# Patient Record
Sex: Male | Born: 1945 | Race: Black or African American | Hispanic: No | Marital: Single | Smoking: Never smoker
Health system: Southern US, Community
[De-identification: ages and names within clinical notes are randomized; demographics above are authoritative.]

## PROBLEM LIST (undated history)

## (undated) DIAGNOSIS — E785 Hyperlipidemia, unspecified: Secondary | ICD-10-CM

## (undated) DIAGNOSIS — I639 Cerebral infarction, unspecified: Secondary | ICD-10-CM

## (undated) DIAGNOSIS — I10 Essential (primary) hypertension: Secondary | ICD-10-CM

## (undated) HISTORY — PX: NO PAST SURGERIES: SHX2092

---

## 2015-09-22 ENCOUNTER — Encounter: Payer: Self-pay | Admitting: Gastroenterology

## 2015-10-05 ENCOUNTER — Encounter (HOSPITAL_COMMUNITY): Payer: Self-pay | Admitting: *Deleted

## 2015-10-05 ENCOUNTER — Inpatient Hospital Stay (HOSPITAL_COMMUNITY)
Admission: EM | Admit: 2015-10-05 | Discharge: 2015-10-08 | DRG: 062 | Disposition: A | Discharge status: Rehabilitation Facility | Payer: Medicare Other | Attending: Neurology | Admitting: Neurology

## 2015-10-05 ENCOUNTER — Emergency Department (HOSPITAL_COMMUNITY): Payer: Medicare Other

## 2015-10-05 DIAGNOSIS — Z8249 Family history of ischemic heart disease and other diseases of the circulatory system: Secondary | ICD-10-CM | POA: Diagnosis not present

## 2015-10-05 DIAGNOSIS — Z79899 Other long term (current) drug therapy: Secondary | ICD-10-CM | POA: Diagnosis not present

## 2015-10-05 DIAGNOSIS — R29810 Facial weakness: Secondary | ICD-10-CM | POA: Diagnosis present

## 2015-10-05 DIAGNOSIS — I639 Cerebral infarction, unspecified: Secondary | ICD-10-CM | POA: Diagnosis present

## 2015-10-05 DIAGNOSIS — E669 Obesity, unspecified: Secondary | ICD-10-CM | POA: Diagnosis present

## 2015-10-05 DIAGNOSIS — R29707 NIHSS score 7: Secondary | ICD-10-CM | POA: Diagnosis present

## 2015-10-05 DIAGNOSIS — Z7982 Long term (current) use of aspirin: Secondary | ICD-10-CM

## 2015-10-05 DIAGNOSIS — I6330 Cerebral infarction due to thrombosis of unspecified cerebral artery: Secondary | ICD-10-CM | POA: Diagnosis not present

## 2015-10-05 DIAGNOSIS — M792 Neuralgia and neuritis, unspecified: Secondary | ICD-10-CM | POA: Diagnosis not present

## 2015-10-05 DIAGNOSIS — Z6837 Body mass index (BMI) 37.0-37.9, adult: Secondary | ICD-10-CM | POA: Diagnosis not present

## 2015-10-05 DIAGNOSIS — E785 Hyperlipidemia, unspecified: Secondary | ICD-10-CM | POA: Diagnosis present

## 2015-10-05 DIAGNOSIS — I69351 Hemiplegia and hemiparesis following cerebral infarction affecting right dominant side: Secondary | ICD-10-CM | POA: Diagnosis not present

## 2015-10-05 DIAGNOSIS — I739 Peripheral vascular disease, unspecified: Secondary | ICD-10-CM | POA: Diagnosis present

## 2015-10-05 DIAGNOSIS — I69391 Dysphagia following cerebral infarction: Secondary | ICD-10-CM | POA: Diagnosis not present

## 2015-10-05 DIAGNOSIS — R609 Edema, unspecified: Secondary | ICD-10-CM | POA: Diagnosis not present

## 2015-10-05 DIAGNOSIS — I6339 Cerebral infarction due to thrombosis of other cerebral artery: Secondary | ICD-10-CM | POA: Diagnosis not present

## 2015-10-05 DIAGNOSIS — R471 Dysarthria and anarthria: Secondary | ICD-10-CM | POA: Diagnosis present

## 2015-10-05 DIAGNOSIS — I6789 Other cerebrovascular disease: Secondary | ICD-10-CM | POA: Diagnosis not present

## 2015-10-05 DIAGNOSIS — G8191 Hemiplegia, unspecified affecting right dominant side: Secondary | ICD-10-CM | POA: Diagnosis present

## 2015-10-05 DIAGNOSIS — I10 Essential (primary) hypertension: Secondary | ICD-10-CM | POA: Diagnosis present

## 2015-10-05 DIAGNOSIS — I638 Other cerebral infarction: Secondary | ICD-10-CM | POA: Diagnosis not present

## 2015-10-05 DIAGNOSIS — I63312 Cerebral infarction due to thrombosis of left middle cerebral artery: Secondary | ICD-10-CM

## 2015-10-05 DIAGNOSIS — I69320 Aphasia following cerebral infarction: Secondary | ICD-10-CM | POA: Diagnosis not present

## 2015-10-05 HISTORY — DX: Essential (primary) hypertension: I10

## 2015-10-05 LAB — CBC
HEMATOCRIT: 39 % (ref 39.0–52.0)
HEMOGLOBIN: 13.2 g/dL (ref 13.0–17.0)
MCH: 32.9 pg (ref 26.0–34.0)
MCHC: 33.8 g/dL (ref 30.0–36.0)
MCV: 97.3 fL (ref 78.0–100.0)
Platelets: 255 10*3/uL (ref 150–400)
RBC: 4.01 MIL/uL — ABNORMAL LOW (ref 4.22–5.81)
RDW: 11.9 % (ref 11.5–15.5)
WBC: 4.6 10*3/uL (ref 4.0–10.5)

## 2015-10-05 LAB — PROTIME-INR
INR: 1.01 (ref 0.00–1.49)
Prothrombin Time: 13.5 seconds (ref 11.6–15.2)

## 2015-10-05 LAB — MRSA PCR SCREENING: MRSA BY PCR: NEGATIVE

## 2015-10-05 LAB — DIFFERENTIAL
BASOS PCT: 1 %
Basophils Absolute: 0 10*3/uL (ref 0.0–0.1)
Eosinophils Absolute: 0.2 10*3/uL (ref 0.0–0.7)
Eosinophils Relative: 4 %
LYMPHS ABS: 2.1 10*3/uL (ref 0.7–4.0)
Lymphocytes Relative: 45 %
MONO ABS: 0.4 10*3/uL (ref 0.1–1.0)
MONOS PCT: 8 %
NEUTROS ABS: 1.9 10*3/uL (ref 1.7–7.7)
Neutrophils Relative %: 42 %

## 2015-10-05 LAB — I-STAT CHEM 8, ED
BUN: 14 mg/dL (ref 6–20)
CALCIUM ION: 1.08 mmol/L — AB (ref 1.13–1.30)
CREATININE: 0.9 mg/dL (ref 0.61–1.24)
Chloride: 105 mmol/L (ref 101–111)
GLUCOSE: 136 mg/dL — AB (ref 65–99)
HCT: 41 % (ref 39.0–52.0)
HEMOGLOBIN: 13.9 g/dL (ref 13.0–17.0)
POTASSIUM: 4 mmol/L (ref 3.5–5.1)
Sodium: 141 mmol/L (ref 135–145)
TCO2: 27 mmol/L (ref 0–100)

## 2015-10-05 LAB — COMPREHENSIVE METABOLIC PANEL
ALT: 25 U/L (ref 17–63)
AST: 28 U/L (ref 15–41)
Albumin: 3.4 g/dL — ABNORMAL LOW (ref 3.5–5.0)
Alkaline Phosphatase: 80 U/L (ref 38–126)
Anion gap: 7 (ref 5–15)
BILIRUBIN TOTAL: 0.4 mg/dL (ref 0.3–1.2)
BUN: 11 mg/dL (ref 6–20)
CALCIUM: 8.9 mg/dL (ref 8.9–10.3)
CHLORIDE: 107 mmol/L (ref 101–111)
CO2: 24 mmol/L (ref 22–32)
CREATININE: 0.94 mg/dL (ref 0.61–1.24)
Glucose, Bld: 139 mg/dL — ABNORMAL HIGH (ref 65–99)
Potassium: 4 mmol/L (ref 3.5–5.1)
Sodium: 138 mmol/L (ref 135–145)
TOTAL PROTEIN: 6.3 g/dL — AB (ref 6.5–8.1)

## 2015-10-05 LAB — I-STAT TROPONIN, ED: TROPONIN I, POC: 0 ng/mL (ref 0.00–0.08)

## 2015-10-05 LAB — APTT: aPTT: 33 seconds (ref 24–37)

## 2015-10-05 MED ORDER — SODIUM CHLORIDE 0.9 % IV SOLN
INTRAVENOUS | Status: DC
  Administered 2015-10-05 – 2015-10-08 (×5): via INTRAVENOUS

## 2015-10-05 MED ORDER — STROKE: EARLY STAGES OF RECOVERY BOOK
Freq: Once | Status: AC
  Administered 2015-10-05: 1
  Filled 2015-10-05 (×2): qty 1

## 2015-10-05 MED ORDER — LABETALOL HCL 5 MG/ML IV SOLN
10.0000 mg | INTRAVENOUS | Status: DC | PRN
  Administered 2015-10-06 – 2015-10-08 (×2): 10 mg via INTRAVENOUS
  Filled 2015-10-05 (×3): qty 4

## 2015-10-05 MED ORDER — ALTEPLASE (STROKE) FULL DOSE INFUSION
90.0000 mg | Freq: Once | INTRAVENOUS | Status: AC
  Administered 2015-10-05: 90 mg via INTRAVENOUS
  Filled 2015-10-05: qty 90

## 2015-10-05 MED ORDER — PANTOPRAZOLE SODIUM 40 MG IV SOLR
40.0000 mg | Freq: Every day | INTRAVENOUS | Status: DC
  Administered 2015-10-05 – 2015-10-07 (×3): 40 mg via INTRAVENOUS
  Filled 2015-10-05 (×3): qty 40

## 2015-10-05 MED ORDER — ACETAMINOPHEN 650 MG RE SUPP: 650.0000 mg | RECTAL | Status: DC | PRN

## 2015-10-05 MED ORDER — ACETAMINOPHEN 325 MG PO TABS: 650.0000 mg | ORAL_TABLET | ORAL | Status: DC | PRN

## 2015-10-05 NOTE — Code Documentation (Signed)
[name] for sudden onset slurred speech and Rt side weakness.  He states he felt dazed and has had a recent hx of syncope.  He is half-way through a month long monitoring with a Holter monitor.  Hx HTN. He is NIH 7 for mild dysarthria, mild aphasia, RUE & RLE driftt and sensory deficit & ataxia.  tPa given with door to needle 26 min.

## 2015-10-05 NOTE — ED Provider Notes (Signed)
CSN: [id number]     Arrival date & time 10/05/15  2018 History   First MD Initiated Contact with Patient 10/05/15 2023     Chief Complaint  Patient presents with  . Code Stroke   69 yo AAM w/PMH of HTN who presents by EMS for stroke like sx. Pt endorses he noted right leg weakness while walking. Now beginning to feel slurred speech and decreased sensation on right side of body. He denies CP, SOB, fever, chills, N/V, diarrhea, constipation, hematemesis, dysuria, hematuria, sick contacts, or recent travel.   Patient is a 69 y.o. male presenting with Acute Neurological Problem.  Cerebrovascular Accident This is a new problem. The current episode started today. The problem occurs constantly. The problem has been gradually worsening. Associated symptoms include numbness (right) and weakness (right leg). Pertinent negatives include no abdominal pain, chest pain, chills, fever, headaches, nausea, vertigo, visual change or vomiting. Associated symptoms comments: Slurred speech. Right side weak.. Nothing aggravates the symptoms. He has tried nothing for the symptoms.    Past Medical History  Diagnosis Date  . Hypertension    History reviewed. No pertinent past surgical history. History reviewed. No pertinent family history. Social History  Substance Use Topics  . Smoking status: Never Smoker   . Smokeless tobacco: None  . Alcohol Use: No    Review of Systems  Constitutional: Negative for fever and chills.  Respiratory: Negative for shortness of breath.   Cardiovascular: Negative for chest pain, palpitations and leg swelling.  Gastrointestinal: Negative for nausea, vomiting, abdominal pain, diarrhea, constipation and abdominal distention.  Genitourinary: Negative for dysuria, frequency, flank pain and decreased urine volume.  Neurological: Positive for speech difficulty (slurring), weakness (right leg) and numbness (right). Negative for dizziness, vertigo, light-headedness and headaches.   All other systems reviewed and are negative.     Allergies  Review of patient's allergies indicates no known allergies.  Home Medications   Prior to Admission medications   Medication Sig Start Date End Date Taking? Authorizing Provider  amLODipine (NORVASC) 5 MG tablet Take 5 mg by mouth daily. 09/19/15  Yes Historical Provider, MD  aspirin EC 81 MG tablet Take 81 mg by mouth daily.   Yes Historical Provider, MD  carvedilol (COREG) 6.25 MG tablet Take 6.25 mg by mouth 2 (two) times daily. 09/30/15  Yes Historical Provider, MD   BP 170/90 mmHg  Pulse 66  Temp(Src) 98.2 F (36.8 C) (Oral)  Resp 17  Ht  (1.753 m)  Wt 115.5 kg  BMI 37.59 kg/m2  SpO2 100% Physical Exam  Constitutional: He is oriented to person, place, and time. He appears well-developed and well-nourished. No distress.  HENT:  Head: Normocephalic and atraumatic.  Eyes: Pupils are equal, round, and reactive to light.  Neck: Normal range of motion.  Cardiovascular: Normal rate, regular rhythm, normal heart sounds and intact distal pulses.  Exam reveals no gallop and no friction rub.   No murmur heard. Pulmonary/Chest: Effort normal and breath sounds normal. No respiratory distress. He has no wheezes. He has no rales. He exhibits no tenderness.  Abdominal: Soft. Bowel sounds are normal. He exhibits no distension and no mass. There is no tenderness. There is no rebound and no guarding.  Musculoskeletal: Normal range of motion. He exhibits no edema or tenderness.  Lymphadenopathy:    He has no cervical adenopathy.  Neurological: He is alert and oriented to person, place, and time. A cranial nerve deficit (slight right facial droop) and sensory deficit (right decreased  sensation) is present. He exhibits abnormal muscle tone (decreased strength in right arm and leg). GCS eye subscore is 4. GCS verbal subscore is 5. GCS motor subscore is 6.  Skin: Skin is warm and dry. He is not diaphoretic.  Nursing note and vitals  reviewed.   ED Course  .Critical Care Performed by: Rachelle Hora Authorized by: Rachelle Hora Total critical care time: 30 minutes Critical care time was exclusive of separately billable procedures and treating other patients. Critical care was necessary to treat or prevent imminent or life-threatening deterioration of the following conditions: CNS failure or compromise (stroke). Critical care was time spent personally by me on the following activities: obtaining history from patient or surrogate, ordering and performing treatments and interventions, re-evaluation of patient's condition, discussions with consultants, ordering and review of laboratory studies and ordering and review of radiographic studies. Subsequent provider of critical care: I assumed direction of critical care for this patient from another provider of my specialty.   (including critical care time) Labs Review Labs Reviewed  CBC - Abnormal; Notable for the following:    RBC 4.01 (*)    All other components within normal limits  COMPREHENSIVE METABOLIC PANEL - Abnormal; Notable for the following:    Glucose, Bld 139 (*)    Total Protein 6.3 (*)    Albumin 3.4 (*)    All other components within normal limits  I-STAT CHEM 8, ED - Abnormal; Notable for the following:    Glucose, Bld 136 (*)    Calcium, Ion 1.08 (*)    All other components within normal limits  PROTIME-INR  APTT  DIFFERENTIAL  I-STAT TROPOININ, ED  CBG MONITORING, ED    Imaging Review Ct Head Wo Contrast  10/05/2015  CLINICAL DATA:  69 year old male with right-sided weakness. Code stroke. EXAM: CT HEAD WITHOUT CONTRAST TECHNIQUE: Contiguous axial images were obtained from the base of the skull through the vertex without intravenous contrast. COMPARISON:  None. FINDINGS: The ventricles and sulci are appropriate in size for patient's age. Mild periventricular and deep white matter hypodensities represent chronic microvascular ischemic changes. There is  no intracranial hemorrhage. No mass effect or midline shift identified. The visualized paranasal sinuses and mastoid air cells are well aerated. The calvarium is intact. IMPRESSION: No acute intracranial hemorrhage. Mild chronic microvascular ischemic disease. If symptoms persist and there are no contraindications, MRI may provide better evaluation if clinically indicated. These results were called by telephone at the time of interpretation on 10/05/2015 at 8:38 pm to Dr. Roseanne Reno who verbally acknowledged these results. Electronically Signed   By: Elgie Collard M.D.   On: 10/05/2015 20:39   I have personally reviewed and evaluated these images and lab results as part of my medical decision-making.   EKG Interpretation None      ED ECG REPORT   Date: 10/05/2015  Rate: 67   Rhythm: normal sinus rhythm  QRS Axis: left  Intervals: normal  ST/T Wave abnormalities: TWI in inferior leads  Conduction Disutrbances:none  Narrative Interpretation:   Old EKG Reviewed: none available  I have personally reviewed the EKG tracing and agree with the computerized printout as noted.   MDM   Final diagnoses:  Cerebrovascular accident (CVA), unspecified mechanism (HCC)   69 yo M who presents as code stroke. Sx onset at 7:30PM w/right leg weakness, now with slurred speech and decreased strength and sensation on right side. Airway intact. Sent to CT scanner.   CT w/no acute bleed. Proceeding w/tPA per Neuro advisement.  Will admit to ICU.  Pt was seen under the supervision of Dr. Jodi MourningZavitz.     Rachelle HoraKeri Breon Rehm, MD 10/05/15 2359  Blane OharaJoshua Zavitz, MD 10/09/15 [phone]

## 2015-10-05 NOTE — ED Notes (Signed)
Attempted to call report

## 2015-10-05 NOTE — Consult Note (Signed)
Admission H&P    Chief Complaint: New onset slurred speech and right side weakness and numbness.  HPI: Sean Wilkerson is an 69 y.o. male with a history of hypertension, obesity and syncopal spells, brought to the ED and code stroke status following acute onset of slurred speech along with weakness and numbness involving his right side. Patient was last known well at 7:30 PM tonight. He has no history of stroke nor TIA. He's been taking aspirin 81 mg per day. CT scan of his head showed no acute intracranial abnormality. NIH stroke score was 7. Patient was deemed a candidate for intravenous thrombolytic therapy with TPA, which was administered. Patient was subsequently admitted to neuro intensive care unit for further management. Patient has been experiencing a couple spells of unclear etiology and is currently wearing a long-term cardiac monitor.  LSN: 7:30 PM on 10/05/2015 tPA Given: Yes mRankin:  Past Medical History  Diagnosis Date  . Hypertension     History reviewed. No pertinent past surgical history.  History reviewed. No pertinent family history. Social History:  reports that he has never smoked. He does not have any smokeless tobacco history on file. He reports that he does not drink alcohol or use illicit drugs.  Allergies: No Known Allergies  Medications: Patient's preadmission medications were reviewed by me.  ROS: History obtained from spouse and the patient  General ROS: negative for - chills, fatigue, fever, night sweats, weight gain or weight loss Psychological ROS: negative for - behavioral disorder, hallucinations, memory difficulties, mood swings or suicidal ideation Ophthalmic ROS: negative for - blurry vision, double vision, eye pain or loss of vision ENT ROS: negative for - epistaxis, nasal discharge, oral lesions, sore throat, tinnitus or vertigo Allergy and Immunology ROS: negative for - hives or itchy/watery eyes Hematological and Lymphatic ROS: negative  for - bleeding problems, bruising or swollen lymph nodes Endocrine ROS: negative for - galactorrhea, hair pattern changes, polydipsia/polyuria or temperature intolerance Respiratory ROS: negative for - cough, hemoptysis, shortness of breath or wheezing Cardiovascular ROS: negative for - chest pain, dyspnea on exertion, edema or irregular heartbeat Gastrointestinal ROS: negative for - abdominal pain, diarrhea, hematemesis, nausea/vomiting or stool incontinence Genito-Urinary ROS: negative for - dysuria, hematuria, incontinence or urinary frequency/urgency Musculoskeletal ROS: negative for - joint swelling or muscular weakness Neurological ROS: as noted in HPI Dermatological ROS: negative for rash and skin lesion changes  Physical Examination: Blood pressure 170/90, pulse 65, temperature 98.2 F (36.8 C), temperature source Oral, resp. rate 18, height '5\' 9"'$  (1.753 m), weight 115.5 kg (254 lb 10.1 oz), SpO2 100 %.  HEENT-  Normocephalic, no lesions, without obvious abnormality.  Normal external eye and conjunctiva.  Normal TM's bilaterally.  Normal auditory canals and external ears. Normal external nose, mucus membranes and septum.  Normal pharynx. Neck supple with no masses, nodes, nodules or enlargement. Cardiovascular - regular rate and rhythm, S1, S2 normal, no murmur, click, rub or gallop Lungs - chest clear, no wheezing, rales, normal symmetric air entry Abdomen - soft, non-tender; bowel sounds normal; no masses,  no organomegaly Extremities - no joint deformities, effusion, or inflammation  Neurologic Examination: Mental Status: Alert, oriented, thought content appropriate.  Speech slightly slurred with mild expressive aphasia. Able to follow commands without difficulty. Cranial Nerves: II-Visual fields were normal. III/IV/VI-Pupils were equal and reacted. Extraocular movements were full and conjugate.    V/VII-no facial numbness; mild right lower facial weakness. VIII-normal. X-mild  dysarthria. XI: trapezius strength/neck flexion strength normal bilaterally XII-midline tongue  extension with normal strength. Motor: Mild drift of right upper and lower extremities. Sensory: Reduced perception of tactile sensation over right extremities compared to left. Deep Tendon Reflexes: 1+ in upper extremities and absent in lower extremities. Plantars: Flexor bilaterally Cerebellar: Normal finger-to-nose testing impaired involving right upper extremity. Carotid auscultation: Normal  Results for orders placed or performed during the hospital encounter of 10/05/15 (from the past 48 hour(s))  Protime-INR     Status: None   Collection Time: 10/05/15  8:29 PM  Result Value Ref Range   Prothrombin Time 13.5 11.6 - 15.2 seconds   INR 1.01 0.00 - 1.49  APTT     Status: None   Collection Time: 10/05/15  8:29 PM  Result Value Ref Range   aPTT 33 24 - 37 seconds  CBC     Status: Abnormal   Collection Time: 10/05/15  8:29 PM  Result Value Ref Range   WBC 4.6 4.0 - 10.5 K/uL   RBC 4.01 (L) 4.22 - 5.81 MIL/uL   Hemoglobin 13.2 13.0 - 17.0 g/dL   HCT 39.0 39.0 - 52.0 %   MCV 97.3 78.0 - 100.0 fL   MCH 32.9 26.0 - 34.0 pg   MCHC 33.8 30.0 - 36.0 g/dL   RDW 11.9 11.5 - 15.5 %   Platelets 255 150 - 400 K/uL  Differential     Status: None   Collection Time: 10/05/15  8:29 PM  Result Value Ref Range   Neutrophils Relative % 42 %   Neutro Abs 1.9 1.7 - 7.7 K/uL   Lymphocytes Relative 45 %   Lymphs Abs 2.1 0.7 - 4.0 K/uL   Monocytes Relative 8 %   Monocytes Absolute 0.4 0.1 - 1.0 K/uL   Eosinophils Relative 4 %   Eosinophils Absolute 0.2 0.0 - 0.7 K/uL   Basophils Relative 1 %   Basophils Absolute 0.0 0.0 - 0.1 K/uL  Comprehensive metabolic panel     Status: Abnormal   Collection Time: 10/05/15  8:29 PM  Result Value Ref Range   Sodium 138 135 - 145 mmol/L   Potassium 4.0 3.5 - 5.1 mmol/L   Chloride 107 101 - 111 mmol/L   CO2 24 22 - 32 mmol/L   Glucose, Bld 139 (H) 65 - 99 mg/dL    BUN 11 6 - 20 mg/dL   Creatinine, Ser 0.94 0.61 - 1.24 mg/dL   Calcium 8.9 8.9 - 10.3 mg/dL   Total Protein 6.3 (L) 6.5 - 8.1 g/dL   Albumin 3.4 (L) 3.5 - 5.0 g/dL   AST 28 15 - 41 U/L   ALT 25 17 - 63 U/L   Alkaline Phosphatase 80 38 - 126 U/L   Total Bilirubin 0.4 0.3 - 1.2 mg/dL   GFR calc non Af Amer >60 >60 mL/min   GFR calc Af Amer >60 >60 mL/min    Comment: (NOTE) The eGFR has been calculated using the CKD EPI equation. This calculation has not been validated in all clinical situations. eGFR's persistently <60 mL/min signify possible Chronic Kidney Disease.    Anion gap 7 5 - 15  I-stat troponin, ED (not at [facility], [facility])     Status: None   Collection Time: 10/05/15  8:33 PM  Result Value Ref Range   Troponin i, poc 0.00 0.00 - 0.08 ng/mL   Comment 3            Comment: Due to the release kinetics of cTnI, a negative result within the first hours  of the onset of symptoms does not rule out myocardial infarction with certainty. If myocardial infarction is still suspected, repeat the test at appropriate intervals.   I-Stat Chem 8, ED  (not at [facility], [facility])     Status: Abnormal   Collection Time: 10/05/15  8:35 PM  Result Value Ref Range   Sodium 141 135 - 145 mmol/L   Potassium 4.0 3.5 - 5.1 mmol/L   Chloride 105 101 - 111 mmol/L   BUN 14 6 - 20 mg/dL   Creatinine, Ser 0.90 0.61 - 1.24 mg/dL   Glucose, Bld 136 (H) 65 - 99 mg/dL   Calcium, Ion 1.08 (L) 1.13 - 1.30 mmol/L   TCO2 27 0 - 100 mmol/L   Hemoglobin 13.9 13.0 - 17.0 g/dL   HCT 41.0 39.0 - 52.0 %   Ct Head Wo Contrast  10/05/2015  CLINICAL DATA:  69 year old male with right-sided weakness. Code stroke. EXAM: CT HEAD WITHOUT CONTRAST TECHNIQUE: Contiguous axial images were obtained from the base of the skull through the vertex without intravenous contrast. COMPARISON:  None. FINDINGS: The ventricles and sulci are appropriate in size for patient's age. Mild periventricular and deep white matter hypodensities  represent chronic microvascular ischemic changes. There is no intracranial hemorrhage. No mass effect or midline shift identified. The visualized paranasal sinuses and mastoid air cells are well aerated. The calvarium is intact. IMPRESSION: No acute intracranial hemorrhage. Mild chronic microvascular ischemic disease. If symptoms persist and there are no contraindications, MRI may provide better evaluation if clinically indicated. These results were called by telephone at the time of interpretation on 10/05/2015 at 8:38 pm to Dr. Nicole Kindred who verbally acknowledged these results. Electronically Signed   By: Anner Crete M.D.   On: 10/05/2015 20:39    Assessment: 69 y.o. male with multiple risk factors for stroke presenting with acute onset of probable left MCA territory acute ischemic infarction.  Stroke Risk Factors - family history and hypertension  Plan: 1. HgbA1c, fasting lipid panel 2. MRI, MRA  of the brain without contrast 3. PT consult, OT consult, Speech consult 4. Echocardiogram 5. Carotid dopplers 6. Prophylactic therapy-Antiplatelet med: Aspirin  7. Risk factor modification 8. Telemetry monitoring  This patient is critically ill and at significant risk of neurological worsening or death, and care requires constant monitoring of vital signs, hemodynamics,respiratory and cardiac monitoring, neurological assessment, discussion with family, other specialists and medical decision making of high complexity. Total critical care time was 60 minutes.  C.R. Nicole Kindred, MD [facility] [phone]  10/05/2015, 9:36 PM

## 2015-10-05 NOTE — ED Notes (Signed)
Family at bedside. Dr.Stewart speaking with family

## 2015-10-05 NOTE — ED Notes (Signed)
Pt to ED via [facility] as a Code Stroke. Pt began having difficulty walking using R leg at 1930. Pt also had R sided weakness, lessened sensory to R side, and slurred speecj. Pt is wearing a halter monitor for syncopal episodes. Pt A&Ox4 on arrival, R sided deficits noted

## 2015-10-06 ENCOUNTER — Inpatient Hospital Stay (HOSPITAL_COMMUNITY): Payer: Medicare Other

## 2015-10-06 ENCOUNTER — Encounter (HOSPITAL_COMMUNITY): Payer: Self-pay | Admitting: *Deleted

## 2015-10-06 DIAGNOSIS — I6789 Other cerebrovascular disease: Secondary | ICD-10-CM

## 2015-10-06 DIAGNOSIS — I639 Cerebral infarction, unspecified: Principal | ICD-10-CM

## 2015-10-06 DIAGNOSIS — G8191 Hemiplegia, unspecified affecting right dominant side: Secondary | ICD-10-CM

## 2015-10-06 LAB — LIPID PANEL
Cholesterol: 205 mg/dL — ABNORMAL HIGH (ref 0–200)
HDL: 44 mg/dL (ref >40)
LDL Cholesterol: 135 mg/dL — ABNORMAL HIGH (ref 0–99)
Total CHOL/HDL Ratio: 4.7 RATIO
Triglycerides: 131 mg/dL (ref <150)
VLDL: 26 mg/dL (ref 0–40)

## 2015-10-06 MED ORDER — ATORVASTATIN CALCIUM 40 MG PO TABS
40.0000 mg | ORAL_TABLET | Freq: Every day | ORAL | Status: DC
  Administered 2015-10-06: 40 mg via ORAL
  Filled 2015-10-06: qty 1

## 2015-10-06 MED ORDER — ASPIRIN 325 MG PO TABS
325.0000 mg | ORAL_TABLET | Freq: Every day | ORAL | Status: DC
  Administered 2015-10-06 – 2015-10-08 (×3): 325 mg via ORAL
  Filled 2015-10-06 (×3): qty 1

## 2015-10-06 NOTE — Plan of Care (Addendum)
Problem: Nutrition: Goal: Risk of aspiration will decrease Outcome: Progressing Pt had SLP eval 12/19 with MBS. Reviewed safety precautions to take while eating and drinking.

## 2015-10-06 NOTE — Progress Notes (Signed)
PT Cancellation Note  Patient Details Name: Sean BirchwoodLarry Lee Wilkerson MRN: [id number] DOB: 1946-02-02   Cancelled Treatment:    Reason Eval/Treat Not Completed: Patient not medically ready.  Pt currently on strict bedrest post tpa last night.  Will hold PT and mobility at this time and f/u when appropriate.     Sunny SchleinRitenour, Sora Olivo F, [place] CarolinaPT [phone] 10/06/2015, 12:18 PM

## 2015-10-06 NOTE — Progress Notes (Signed)
  Echocardiogram 2D Echocardiogram has been performed.  Janalyn HarderWest, Kearsten Ginther R 10/06/2015, 3:40 PM

## 2015-10-06 NOTE — Care Management Note (Signed)
Case Management Note  Patient Details  Name: Sean Wilkerson MRN: [id number] Date of Birth: [birth year]  Subjective/Objective:  Pt admitted on 10/05/15 s/p stroke with TPA given.  PTA, pt independent, lives with spouse.                   Action/Plan: Will follow for discharge planning as pt progresses.  PT/OT consults pending.    Expected Discharge Date:                  Expected Discharge Plan:  IP Rehab Facility  In-House Referral:     Discharge planning Services  CM Consult  Post Acute Care Choice:    Choice offered to:     DME Arranged:    DME Agency:     HH Arranged:    HH Agency:     Status of Service:  In process, will continue to follow  Medicare Important Message Given:    Date Medicare IM Given:    Medicare IM give by:    Date Additional Medicare IM Given:    Additional Medicare Important Message give by:     If discussed at Long Length of Stay Meetings, dates discussed:    Additional Comments:  Quintella BatonJulie W. Aalaiyah Yassin, RN, BSN  Trauma/Neuro ICU Case Manager [phone]

## 2015-10-06 NOTE — Evaluation (Signed)
CognitiveSpeech Language Pathology Evaluation Patient Details Name: Sean BirchwoodLarry Lee Wilkerson MRN: [id number] DOB: 1946/05/15 Today's Date: 10/06/2015 Time: 0820-0830 SLP Time Calculation (min) (ACUTE ONLY): 10 min  Problem List:  Patient Active Problem List   Diagnosis Date Noted  . Stroke (cerebrum) (HCC) 10/05/2015  . CVA (cerebral infarction) 10/05/2015   Past Medical History:  Past Medical History  Diagnosis Date  . Hypertension    Past Surgical History: History reviewed. No pertinent past surgical history. HPI:  Sean Wilkerson is an 69 y.o. male with a history of hypertension, obesity and syncopal spells, brought to the ED and code stroke status following acute onset of slurred speech along with weakness and numbness involving his right side. Patient was last known well at 7:30 PM tonight. He has no history of stroke nor TIA. He's been taking aspirin 81 mg per day. CT scan of his head showed no acute intracranial abnormality. NIH stroke score was 7. Patient was deemed a candidate for intravenous thrombolytic therapy with TPA, which was administered. Patient was subsequently admitted to neuro intensive care unit for further management. Patient has been experiencing a couple spells of unclear etiology and is currently wearing a long-term cardiac monitor.   Assessment / Plan / Recommendation Clinical Impression   Cognitive/linguistic and motor speech evaluation was completed.  The patient scored at 27/30 on the Mini Mental State Exam.  The patient had difficulty copying a design and recalling with interference 3/3 novel words.  He was able to recall 2/3 and given semantic cues recalled the third word.  The patient presented with a moderate- severe dysarthria impacting speech intelligibility.   Recommend continued ST to address at this and next level of care.      SLP Assessment  Patient needs continued Speech Lanaguage Pathology Services    Follow Up Recommendations  Inpatient Rehab     Frequency and Duration min 2x/week  2 weeks      SLP Evaluation Prior Functioning  Cognitive/Linguistic Baseline: Within functional limits   Cognition  Overall Cognitive Status: Within Functional Limits for tasks assessed Arousal/Alertness: Awake/alert Orientation Level: Oriented X4 Attention: Sustained Sustained Attention: Appears intact Memory: Appears intact Awareness: Appears intact Problem Solving: Appears intact    Comprehension  Auditory Comprehension Overall Auditory Comprehension: Appears within functional limits for tasks assessed Yes/No Questions: Within Functional Limits Commands: Within Functional Limits Conversation: Simple Reading Comprehension Reading Status: Within funtional limits    Expression Expression Primary Mode of Expression: Verbal Verbal Expression Overall Verbal Expression: Appears within functional limits for tasks assessed Automatic Speech: Name;Social Response Level of Generative/Spontaneous Verbalization: Sentence Repetition: No impairment Naming: No impairment Pragmatics: No impairment Non-Verbal Means of Communication: Not applicable Written Expression Dominant Hand: Left Written Expression: Within Functional Limits (except decreased legibility)   Oral / Motor Oral Motor/Sensory Function Overall Oral Motor/Sensory Function: Moderate impairment Facial ROM: Reduced right Facial Symmetry: Abnormal symmetry right Facial Strength: Reduced right Facial Sensation: Within Functional Limits Lingual ROM: Reduced right Lingual Symmetry: Abnormal symmetry right Lingual Strength: Reduced Motor Speech Overall Motor Speech: Impaired Respiration: Within functional limits Phonation: Breathy;Low vocal intensity Resonance: Within functional limits Articulation: Impaired Level of Impairment: Phrase Intelligibility: Intelligibility reduced Word: 50-74% accurate Phrase: 25-49% accurate Sentence: 25-49% accurate Conversation: 25-49%  accurate Motor Planning: Witnin functional limits Motor Speech Errors: Not applicable   Sean AguasMelissa Rea Kalama, MA, CCC-SLP Acute Rehab SLP [phone] Fleet ContrasGoodman, Pistol Kessenich N 10/06/2015, 8:49 AM

## 2015-10-06 NOTE — Progress Notes (Signed)
Paged MD regarding return of weakness on right side.  Orders received.

## 2015-10-06 NOTE — Progress Notes (Signed)
STROKE TEAM PROGRESS NOTE   HISTORY Sean Wilkerson is an 69 y.o. male with a history of hypertension, obesity and syncopal spells, brought to the ED and code stroke status following acute onset of slurred speech along with weakness and numbness involving his right side. Patient was last known well at 7:30 PM tonight. He has no history of stroke nor TIA. He's been taking aspirin 81 mg per day. CT scan of his head showed no acute intracranial abnormality. NIH stroke score was 7. Patient was deemed a candidate for intravenous thrombolytic therapy with TPA, which was administered. Patient was subsequently admitted to neuro intensive care unit for further management. Patient has been experiencing a couple spells of unclear etiology and is currently wearing a long-term cardiac monitor. IV TPA  90 mg Sunday 10/05/2015 at 2045  LSN: 7:30 PM on 10/05/2015 tPA Given: Yes mRankin:   SUBJECTIVE (INTERVAL HISTORY) His  wife is at the bedside.  Overall he feels his condition is gradually worsening. Recurrence of right hemiplegia last night. Blood pressure has been stable. Repeat CT scan of the head last night showed no hemorrhage or acute abnormality.   OBJECTIVE Temp:  [97.5 F (36.4 C)-98.2 F (36.8 C)] 97.8 F (36.6 C) (12/19 0741) Pulse Rate:  [62-78] 64 (12/19 0700) Cardiac Rhythm:  [-] Normal sinus rhythm (12/18 2200) Resp:  [8-24] 18 (12/19 0700) BP: (133-192)/(64-93) 155/69 mmHg (12/19 0700) SpO2:  [96 %-100 %] 99 % (12/19 0700) Weight:  [115.5 kg (254 lb 10.1 oz)] 115.5 kg (254 lb 10.1 oz) (12/18 2033)  CBC:  Recent Labs Lab 10/05/15 2029 10/05/15 2035  WBC 4.6  --   NEUTROABS 1.9  --   HGB 13.2 13.9  HCT 39.0 41.0  MCV 97.3  --   PLT 255  --     Basic Metabolic Panel:  Recent Labs Lab 10/05/15 2029 10/05/15 2035  NA 138 141  K 4.0 4.0  CL 107 105  CO2 24  --   GLUCOSE 139* 136*  BUN 11 14  CREATININE 0.94 0.90  CALCIUM 8.9  --     Lipid Panel:    Component  Value Date/Time   CHOL 205* 10/06/2015 0228   TRIG 131 10/06/2015 0228   HDL 44 10/06/2015 0228   CHOLHDL 4.7 10/06/2015 0228   VLDL 26 10/06/2015 0228   LDLCALC 135* 10/06/2015 0228   HgbA1c: No results found for: HGBA1C Urine Drug Screen: No results found for: LABOPIA, COCAINSCRNUR, LABBENZ, AMPHETMU, THCU, LABBARB    IMAGING  Ct Head Wo Contrast 10/06/2015   No acute intracranial hemorrhage. Mild age-related atrophy and chronic microvascular ischemic disease.    Ct Head Wo Contrast 10/05/2015   No acute intracranial hemorrhage. Mild chronic microvascular ischemic disease. If symptoms persist and there are no contraindications, MRI may provide better evaluation if clinically indicated.    MRI / MRA - Pending    PHYSICAL EXAM  Middle-aged obese African-American male not in distress. . Afebrile. Head is nontraumatic. Neck is supple without bruit.    Cardiac exam no murmur or gallop. Lungs are clear to auscultation. Distal pulses are well felt.  Neurological Exam : Awake alert oriented to time place and person. Mild dysarthria. Moderate right lower facial weakness. Extraocular movements full range except slight restriction of right lateral gaze. Blinks to threat bilaterally. Fundi were not visualized. Vision acuity seems adequate. Tongue deviates to the right. Motor system exam reveals dense right hemiplegia with 0/5 strength. Normal strength in the left. Diminished  on the right. Sensation and diminished in the right hemibody distribution. Deep tendon reflexes absent on the right hand normal on the left. Right plantar upgoing left downgoing. Gait cannot be tested.  NIHSS 13   ASSESSMENT/PLAN [name] is a 69 y.o. male with history of hypertension, obesity and syncopal spells, presenting with slurred speech along with weakness and numbness involving his right side. 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 No cardiac source of emboli identified.  LDL 135  HgbA1c pending  VTE prophylaxis - SCDs  Diet NPO time specified  aspirin 81 mg daily prior to admission, now on No antithrombotic secondary to TPA  Patient counseled to be compliant with his antithrombotic medications  Ongoing aggressive stroke risk factor management  Therapy recommendations:  Pending  Disposition:  Pending  Hypertension  Stable Permissive hypertension (OK if < 220/120) but gradually normalize in 5-7 days  Hyperlipidemia  Home meds: No lipid lowering medications prior to admission  LDL 135, goal < 70  Add Lipitor 40 mg daily  Continue statin at discharge    Other Stroke Risk Factors  Advanced age  Obesity, Body mass index is 37.59 kg/(m^2).    Other Active Problems    Hospital day # 1  Delton SeeDavid Rinehuls PA-C [facility] Pager [phone] 10/06/2015, 5:34 PM I have personally examined this patient, reviewed notes, independently viewed imaging studies, participated in medical decision making and plan of care. I have made any additions or clarifications directly to the above note. Agree with note above. She presented with right gaze paresis and improved dramatically following IV TPA but unfortunately developed dense hemiplegia related on. He likely has a large left subcortical infarct from small vessel disease though workup is pending. He remains at risk for neurological worsening, recurrent stroke, TIA and needs ongoing evaluation and close blood pressure monitoring and neurological follow-up. I long discussion of the bedside with the patient's wife and multiple family members and discuss his prognosis, plan of care and answered questions. This patient is critically ill and at significant risk of  neurological worsening, death and care requires constant monitoring of vital signs, hemodynamics,respiratory and cardiac monitoring, extensive review of multiple databases, frequent neurological assessment, discussion with family, other specialists and medical decision making of high complexity.I have made any additions or clarifications directly to the above note.This critical care time does not reflect procedure time, or teaching time or supervisory time of PA/NP/Med Resident etc but could involve care discussion time.  I spent 45 minutes of neurocritical care time  in the care of  this patient.     Delia HeadyPramod Dewitte Vannice, MD Medical Director [facility] Pager: [phone] 10/06/2015 8:30 PM     To contact Stroke Continuity provider, please refer to [email or web address]. After hours, contact General Neurology

## 2015-10-06 NOTE — Evaluation (Signed)
Clinical/Bedside Swallow Evaluation Patient Details  Name: Sean Wilkerson MRN: [id number] Date of Birth: 12/24/1945  Today's Date: 10/06/2015 Time: SLP Start Time (ACUTE ONLY): 0810 SLP Stop Time (ACUTE ONLY): 0820 SLP Time Calculation (min) (ACUTE ONLY): 10 min  Past Medical History:  Past Medical History  Diagnosis Date  . Hypertension    Past Surgical History: History reviewed. No pertinent past surgical history. HPI:  Sean Wilkerson is an 69 y.o. male with a history of hypertension, obesity and syncopal spells, brought to the ED and code stroke status following acute onset of slurred speech along with weakness and numbness involving his right side. Patient was last known well at 7:30 PM tonight. He has no history of stroke nor TIA. He's been taking aspirin 81 mg per day. CT scan of his head showed no acute intracranial abnormality. NIH stroke score was 7. Patient was deemed a candidate for intravenous thrombolytic therapy with TPA, which was administered. Patient was subsequently admitted to neuro intensive care unit for further management. Patient has been experiencing a couple spells of unclear etiology and is currently wearing a long-term cardiac monitor.   Assessment / Plan / Recommendation Clinical Impression     Clinical swallowing evaluation was completed. Oral mechanism exam revealed significant right sided facial, lingual and labial weakness and decreased ROM.  Lingual deviation to the right noted for all movements.  No right sided facial movement noted given attempted movement.  Sensation appeared to be intact.    Clinically the patient presented with oropharyngeal dysphagia characterized by decreased labial seal with anterior escape on the right, delayed oral transit and delayed swallow trigger.  Wet vocal quality noted post swallow.  The patient with very weak cough and weak sounding voice.  Given this and his current symptoms recommend keeping the patient NPO pending  MBS.     Aspiration Risk  Moderate aspiration risk    Diet Recommendation   NPO pending MBS.    Medication Administration: Via alternative means    Other  Recommendations Oral Care Recommendations: Oral care QID   Follow up Recommendations  Inpatient Rehab    Frequency and Duration min 2x/week  2 weeks       Prognosis Prognosis for Safe Diet Advancement: Fair      Swallow Study   General Date of Onset: 10/05/15 HPI: Sean Wilkerson is an 69 y.o. male with a history of hypertension, obesity and syncopal spells, brought to the ED and code stroke status following acute onset of slurred speech along with weakness and numbness involving his right side. Patient was last known well at 7:30 PM tonight. He has no history of stroke nor TIA. He's been taking aspirin 81 mg per day. CT scan of his head showed no acute intracranial abnormality. NIH stroke score was 7. Patient was deemed a candidate for intravenous thrombolytic therapy with TPA, which was administered. Patient was subsequently admitted to neuro intensive care unit for further management. Patient has been experiencing a couple spells of unclear etiology and is currently wearing a long-term cardiac monitor. Type of Study: Bedside Swallow Evaluation Previous Swallow Assessment: None noted.   Diet Prior to this Study: NPO Temperature Spikes Noted: No Respiratory Status: Room air History of Recent Intubation: No Behavior/Cognition: Alert;Cooperative;Pleasant mood Oral Cavity Assessment: Within Functional Limits Oral Care Completed by SLP: No Vision: Functional for self-feeding Self-Feeding Abilities: Able to feed self Patient Positioning: Upright in bed Baseline Vocal Quality: Low vocal intensity;Breathy Volitional Cough: Weak Volitional Swallow: Able to  elicit    Oral/Motor/Sensory Function Overall Oral Motor/Sensory Function: Moderate impairment Facial ROM: Reduced right Facial Symmetry: Abnormal symmetry right Facial  Strength: Reduced right Facial Sensation: Within Functional Limits Lingual ROM: Reduced right Lingual Symmetry: Abnormal symmetry right Lingual Strength: Reduced   Ice Chips Ice chips: Impaired Presentation: Spoon Oral Phase Impairments: Impaired mastication Oral Phase Functional Implications: Prolonged oral transit Pharyngeal Phase Impairments: Suspected delayed Swallow   Thin Liquid Thin Liquid: Impaired Presentation: Spoon Oral Phase Impairments: Poor awareness of bolus;Impaired mastication Oral Phase Functional Implications: Right anterior spillage;Prolonged oral transit Pharyngeal  Phase Impairments: Suspected delayed Swallow;Wet Vocal Quality    Nectar Thick Nectar Thick Liquid: Not tested   Honey Thick Honey Thick Liquid: Not tested   Puree Puree: Impaired Presentation: Spoon Oral Phase Impairments: Impaired mastication Oral Phase Functional Implications: Prolonged oral transit Pharyngeal Phase Impairments: Suspected delayed Swallow   Solid Solid: Not tested      Dimas Aguas, MA, CCC-SLP Acute Rehab SLP [phone] Dimas Aguas N 10/06/2015,8:41 AM

## 2015-10-07 ENCOUNTER — Encounter (HOSPITAL_COMMUNITY): Payer: Self-pay | Admitting: Physical Medicine and Rehabilitation

## 2015-10-07 ENCOUNTER — Inpatient Hospital Stay (HOSPITAL_COMMUNITY): Payer: Medicare Other

## 2015-10-07 DIAGNOSIS — G8191 Hemiplegia, unspecified affecting right dominant side: Secondary | ICD-10-CM

## 2015-10-07 DIAGNOSIS — I639 Cerebral infarction, unspecified: Secondary | ICD-10-CM

## 2015-10-07 DIAGNOSIS — I6339 Cerebral infarction due to thrombosis of other cerebral artery: Secondary | ICD-10-CM

## 2015-10-07 LAB — GLUCOSE, CAPILLARY
GLUCOSE-CAPILLARY: 131 mg/dL — AB (ref 65–99)
GLUCOSE-CAPILLARY: 124 mg/dL — AB (ref 65–99)

## 2015-10-07 LAB — HEMOGLOBIN A1C
HEMOGLOBIN A1C: 6 % — AB (ref 4.8–5.6)
Mean Plasma Glucose: 126 mg/dL

## 2015-10-07 MED ORDER — CARVEDILOL 6.25 MG PO TABS
6.2500 mg | ORAL_TABLET | Freq: Two times a day (BID) | ORAL | Status: DC
  Administered 2015-10-07 – 2015-10-08 (×2): 6.25 mg via ORAL
  Filled 2015-10-07 (×2): qty 1

## 2015-10-07 MED ORDER — AMLODIPINE BESYLATE 5 MG PO TABS
5.0000 mg | ORAL_TABLET | Freq: Every day | ORAL | Status: DC
  Administered 2015-10-07 – 2015-10-08 (×2): 5 mg via ORAL
  Filled 2015-10-07 (×2): qty 1

## 2015-10-07 NOTE — Progress Notes (Signed)
OT Cancellation Note  Patient Details Name: Adolphus BirchwoodLarry Lee Roads MRN: [id number] DOB: [birth year]   Cancelled Treatment:    Reason Eval/Treat Not Completed: Patient not medically ready (bedrest) OT order received and appreciated however this conflicts with current bedrest order set. Please increase activity tolerance as appropriate and remove bedrest from orders. . Please contact OT at [phone] if bed rest order is discontinued. OT will hold evaluation at this time and will check back as time allows pending increased activity orders.   Harolyn RutherfordJones, Vallerie Hentz B  [phone] 10/07/2015, 6:40 AM

## 2015-10-07 NOTE — Evaluation (Signed)
Physical Therapy Evaluation Patient Details Name: Sean Wilkerson MRN: [id number] DOB: 13-Jun-1946 Today's Date: 10/07/2015   History of Present Illness  pt presents with large L Subcortical Infarct.  pt with hx of HTN and Syncope.    Clinical Impression  Pt very motivated and follows cues well.  Feel pt will benefit from CIR at D/C to maximize independence and decrease burden of care prior to returning to home.  Will continue to follow.      Follow Up Recommendations CIR    Equipment Recommendations  None recommended by PT    Recommendations for Other Services Rehab consult     Precautions / Restrictions Precautions Precautions: Fall Restrictions Weight Bearing Restrictions: No      Mobility  Bed Mobility Overal bed mobility: Needs Assistance;+2 for physical assistance Bed Mobility: Supine to Sit;Sit to Supine     Supine to sit: Mod assist;+2 for physical assistance;HOB elevated Sit to supine: Mod assist;+2 for physical assistance   General bed mobility comments: pt does attempt to perform bed mobility and has difficulty with moving R side and coordination.    Transfers Overall transfer level: Needs assistance Equipment used: 2 person hand held assist Transfers: Sit to/from Stand Sit to Stand: Max assist;+2 physical assistance         General transfer comment: pt needs R LE blocked and facilitation for weight shifting to L side as pt tends to lean to R side, but not pushing.  pt's R LE tends to hyperextend pushing R toes against PT's foot and causing hip flexion.  Facilitation for hip/trunk extension and working on R knee extension without hyperextension.  Repeated transfer x2.    Ambulation/Gait                Stairs            Wheelchair Mobility    Modified Rankin (Stroke Patients Only) Modified Rankin (Stroke Patients Only) Pre-Morbid Rankin Score: No symptoms Modified Rankin: Severe disability     Balance Overall balance assessment:  Needs assistance Sitting-balance support: Single extremity supported;Feet supported Sitting balance-Leahy Scale: Fair Sitting balance - Comments: pt drifts towards R side and with minimal cues is able to bring self back to midline.   Postural control: Right lateral lean Standing balance support: During functional activity Standing balance-Leahy Scale: Poor                               Pertinent Vitals/Pain Pain Assessment: No/denies pain    Home Living Family/patient expects to be discharged to:: Inpatient rehab Living Arrangements: Spouse/significant other                    Prior Function Level of Independence: Independent         Comments: Works as a Education officer, environmental.     Hand Dominance   Dominant Hand: Left    Extremity/Trunk Assessment   Upper Extremity Assessment: Defer to OT evaluation           Lower Extremity Assessment: RLE deficits/detail RLE Deficits / Details: Strength near flaccid.  Does demonstrate trace hamstrings and hip hike.  Sensation diminished, but not absent.      Cervical / Trunk Assessment: Normal  Communication   Communication: Expressive difficulties (Slurred and quiet)  Cognition Arousal/Alertness: Awake/alert Behavior During Therapy: Flat affect Overall Cognitive Status: Within Functional Limits for tasks assessed  General Comments      Exercises        Assessment/Plan    PT Assessment Patient needs continued PT services  PT Diagnosis Difficulty walking;Hemiplegia dominant side   PT Problem List Decreased strength;Decreased activity tolerance;Decreased balance;Decreased mobility;Decreased coordination;Decreased knowledge of use of DME;Impaired sensation;Obesity  PT Treatment Interventions DME instruction;Gait training;Functional mobility training;Therapeutic activities;Therapeutic exercise;Balance training;Neuromuscular re-education;Patient/family education   PT Goals (Current goals  can be found in the Care Plan section) Acute Rehab PT Goals Patient Stated Goal: To continue working as a Education officer, environmentalpastor. PT Goal Formulation: With patient/family Time For Goal Achievement: 10/21/15 Potential to Achieve Goals: Good    Frequency Min 4X/week   Barriers to discharge        Co-evaluation               End of Session Equipment Utilized During Treatment: Gait belt Activity Tolerance: Patient tolerated treatment well Patient left: in bed;with call bell/phone within reach;with family/visitor present Nurse Communication: Mobility status         Time: [id number] PT Time Calculation (min) (ACUTE ONLY): 27 min   Charges:   PT Evaluation $Initial PT Evaluation Tier I: 1 Procedure PT Treatments $Therapeutic Activity: 8-22 mins   PT G CodesSunny Schlein:        Denna Fryberger F, [place]PT [phone] 10/07/2015, 2:26 PM

## 2015-10-07 NOTE — Consult Note (Signed)
Physical Medicine and Rehabilitation Consult   Reason for Consult: Right sided weakness and difficulty speaking.  Referring Physician: Dr. Pearlean Brownie   HPI: Sean Wilkerson is a 69 y.o. left handed male with history of HTN, recent syncopal episode (being evaluated with cardiac monitor) who was admitted on 10/05/15 with right sided weakness and slurred speech. CT head negative and he was treated with tPA for NIHSS 7. 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  2D echo with EF 55-60% with no wall abnormality. Carotid dopplers without ICA stenosis.  Swallow evaluation done yesterday and patient started on regular diet, thin liquids with aspiration precautions for mild pharyngeal dysphagia. Patient with resultant right hemiparesis, moderate to severe dysarthria and dysphagia. PT/OT evaluations pending and CIR recommended in anticipation of extensive rehab needs.   Review of Systems  HENT: Negative for congestion and hearing loss.   Eyes: Negative for blurred vision and double vision.  Respiratory: Negative for cough, shortness of breath and wheezing.   Cardiovascular: Negative for chest pain and palpitations.  Gastrointestinal: Negative for heartburn, nausea, abdominal pain and constipation.  Genitourinary: Negative for dysuria, urgency and frequency.  Musculoskeletal: Negative for myalgias, back pain and neck pain.  Neurological: Positive for sensory change, speech change and focal weakness. Negative for dizziness, tingling and headaches.  Psychiatric/Behavioral: The patient is not nervous/anxious and does not have insomnia.       Past Medical History  Diagnosis Date  . Hypertension     Past Surgical History  Procedure Laterality Date  . No past surgeries      Family  History  Problem Relation Age of Onset  . Cancer Maternal Uncle       Social History:  Married.  Works fulltime as a Education officer, environmental at [employer]. Independent without AD.  Wife works part time as a [employer]. He reports that he has never smoked. He does not have any smokeless tobacco history on file. He reports that he does not drink alcohol or use illicit drugs.    Allergies: No Known Allergies    Medications Prior to Admission  Medication Sig Dispense Refill  . amLODipine (NORVASC) 5 MG tablet Take 5 mg by mouth daily.  1  . aspirin EC 81 MG tablet Take 81 mg by mouth daily.    . carvedilol (COREG) 6.25 MG tablet Take 6.25 mg by mouth 2 (two) times daily.  1    Home: Home Living Family/patient expects to be discharged to:: Private residence Living Arrangements: Spouse/significant other  Functional History:   Functional Status:  Mobility:          ADL:    Cognition: Cognition Overall Cognitive Status: Within Functional Limits for tasks assessed Arousal/Alertness: Awake/alert Orientation Level: Oriented X4 Attention: Sustained Sustained Attention: Appears intact Memory: Appears intact Awareness: Appears intact Problem Solving: Appears intact Cognition Overall Cognitive Status: Within Functional Limits for tasks assessed    Blood pressure 146/77, pulse 67, temperature 98.8 F (37.1 C), temperature source Oral, resp. rate 19, height 5\' 9"  (1.753 m), weight 115.5 kg (254 lb 10.1 oz), SpO2 100 %. Physical Exam  Nursing note and vitals reviewed. Constitutional: He is oriented to person, place, and time. He appears well-developed and well-nourished. He is cooperative.  HENT:  Head: Normocephalic and atraumatic.  Eyes: Conjunctivae are  normal. Pupils are equal, round, and reactive to light.  Neck: Normal range of motion. Neck supple.  Cardiovascular: Normal rate and regular rhythm.   Respiratory: Effort normal and breath sounds normal. No stridor. No respiratory distress. He  exhibits no tenderness.  GI: Soft. Bowel sounds are normal. He exhibits no distension. There is no tenderness.  Musculoskeletal: He exhibits no edema or tenderness.  Neurological: He is alert and oriented to person, place, and time.  Mild right facial weakness with moderate dysarthria.  Able to follow one and two step commands without difficulty. Right hemiparesis with sensory deficits. Has good awareness and insight into deficits. Strength 0/5 RUE and RLE. Right central 7 and tongue deviation.   Skin: Skin is warm and dry.  Psychiatric: He has a normal mood and affect. His behavior is normal. Judgment and thought content normal. His speech is slurred. His speech is not tangential. Cognition and memory are normal. He is communicative.       No results found for this or any previous visit (from the past 24 hour(s)). Ct Head Wo Contrast  10/06/2015  CLINICAL DATA:  69 year old male with right-sided weakness status post tPA. EXAM: CT HEAD WITHOUT CONTRAST TECHNIQUE: Contiguous axial images were obtained from the base of the skull through the vertex without intravenous contrast. COMPARISON:  Earlier CT dated 10/05/2015 FINDINGS: There is mild prominence of the ventricles and sulci compatible with age-related volume loss. Mild periventricular and deep white matter hypodensities represent chronic microvascular ischemic changes. There is no intracranial hemorrhage. No mass effect or midline shift identified. There is slight apparent hyperattenuating appearance of the MCAs bilaterally, likely related to hemoconcentration. The visualized paranasal sinuses and mastoid air cells are well aerated. The calvarium is intact. Diffuse subcutaneous stranding noted over the occiput. IMPRESSION: No acute intracranial hemorrhage. Mild age-related atrophy and chronic microvascular ischemic disease. Electronically Signed   By: Elgie CollardArash  Radparvar M.D.   On: 10/06/2015 02:23    Ct Head Wo Contrast  10/05/2015  CLINICAL  DATA:  [age] year old male with right-sided weakness. Code stroke. EXAM: CT HEAD WITHOUT CONTRAST TECHNIQUE: Contiguous axial images were obtained from the base of the skull through the vertex without intravenous contrast. COMPARISON:  None. FINDINGS: The ventricles and sulci are appropriate in size for patient's age. Mild periventricular and deep white matter hypodensities represent chronic microvascular ischemic changes. There is no intracranial hemorrhage. No mass effect or midline shift identified. The visualized paranasal sinuses and mastoid air cells are well aerated. The calvarium is intact. IMPRESSION: No acute intracranial hemorrhage. Mild chronic microvascular ischemic disease. If symptoms persist and there are no contraindications, MRI may provide better evaluation if clinically indicated. These results were called by telephone at the time of interpretation on 10/05/2015 at 8:38 pm to Dr. Roseanne RenoStewart who verbally acknowledged these results. Electronically Signed   By: Elgie CollardArash  Radparvar M.D.   On: 10/05/2015 20:39   Mr Brain Wo Contrast  10/06/2015  CLINICAL DATA:  69 year old male with new onset slurred speech and right side weakness, code stroke status post IV tPA. Initial encounter. EXAM: MRI HEAD WITHOUT CONTRAST MRA HEAD WITHOUT CONTRAST TECHNIQUE: Multiplanar, multiecho pulse sequences of the brain and surrounding structures were obtained without intravenous contrast. Angiographic images of the head were obtained using MRA technique without contrast. COMPARISON:  Head CT without contrast 0134 hours today, and 10/05/2015. FINDINGS: MRI HEAD FINDINGS 3 cm oval lacunar infarct with restricted diffusion tracking from the left corona radiata through the posterior limb of the external capsule and  posterior left lentiform nuclei. Associated T2 and FLAIR hyperintensity. No associated hemorrhage or mass effect. No other restricted diffusion identified. Major intracranial vascular flow voids are within normal  limits. Small chronic lacunar infarct in the posterior left cerebellum. Scattered and patchy cerebral white matter T2 and FLAIR hyperintensity outside of the acute findings. No cerebral cortical encephalomalacia. Possible chronic microhemorrhage versus vessel artifact in the left sensory strip on series 9, image 21. No midline shift, mass effect, evidence of mass lesion, ventriculomegaly, extra-axial collection or acute intracranial hemorrhage. Cervicomedullary junction and pituitary are within normal limits. Negative visualized cervical spine. Normal bone marrow signal. Visible internal auditory structures appear normal. 8 mastoids are clear. Mild paranasal sinus mucosal thickening. Leftward gaze deviation, otherwise negative orbit soft tissues. No acute scalp soft tissue findings, left posterior scalp soft tissue scarring. MRA HEAD FINDINGS Codominant distal vertebral arteries with antegrade flow in the posterior circulation. Normal PICA origins. Normal vertebrobasilar junction. No basilar stenosis. Normal SCA and PCA origins. Posterior communicating arteries are diminutive or absent. Mild to moderate stenosis in the right PCA P2 segment. Otherwise bilateral PCA branches are within normal limits. Antegrade flow in both ICA siphons. No siphon stenosis. Patent carotid termini. Normal ophthalmic artery origins. Normal MCA and ACA origins. Diminutive or absent anterior communicating artery. Visualized ACA branches are within normal limits. Mild irregularity and stenosis of both the distal right M1 segment and at the left MCA bifurcation, but otherwise visualized bilateral MCA branches are within normal limits. IMPRESSION: 1. Acute 3 cm oval lacunar infarct extending from the left corona radiata through the left external capsule and posterior left lentiform nuclei. No associated hemorrhage or mass effect. 2. Intracranial atherosclerosis with no emergent large vessel occlusion or proximal stenosis. Mild stenosis at the  left MCA bifurcation with otherwise normal visualized left MCA branches. Mild to moderate right PCA P2 segment stenosis with preserved distal flow. 3. 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 Electronically Signed   By: Odessa Fleming M.D.   On: 10/06/2015 18:48      Assessment/Plan: Diagnosis: left corona radiata/external capsule/left lentiform nuclei 1. Does the need for close, 24 hr/day medical supervision in concert with the patient's rehab needs make it unreasonable for this patient to be served in a less intensive setting? Yes 2. Co-Morbidities requiring supervision/potential complications: htn,  3. Due to bladder management, bowel management, safety, skin/wound care, disease management, medication administration, pain management and patient education, does the patient require 24 hr/day rehab nursing? Yes 4. Does the patient require coordinated care of a physician, rehab nurse, PT (1-2 hrs/day, 5 days/week), OT (1-2 hrs/day, 5 days/week) and SLP (1-2 hrs/day, 5 days/week) to address physical and functional deficits in the context of the above medical diagnosis(es)? Yes Addressing deficits in the following areas: balance, endurance, locomotion, strength, transferring, bowel/bladder control, bathing, dressing, feeding, grooming, toileting, speech and psychosocial support 5. Can the patient actively participate in an intensive therapy program of at least 3 hrs of therapy per day at least 5 days per week? Yes 6. The potential for patient to make measurable gains while on inpatient rehab is excellent 7. Anticipated functional outcomes upon discharge from inpatient rehab are supervision and min assist  with PT, supervision and min assist with OT, modified independent with SLP. 8. Estimated rehab length of stay to reach the above functional goals is: 20-25 days 9. Does the patient have adequate social supports and living environment to accommodate  these discharge  functional goals? Yes 10. Anticipated D/C setting: Home 11. Anticipated post D/C treatments: HH therapy and Outpatient therapy 12. Overall Rehab/Functional Prognosis: excellent  RECOMMENDATIONS: This patient's condition is appropriate for continued rehabilitative care in the following setting: CIR Patient has agreed to participate in recommended program. Yes Note that insurance prior authorization may be required for reimbursement for recommended care.  Comment: Rehab Admissions Coordinator to follow up.  Thanks,  Ranelle Oyster, MD, [place] [facility]     10/07/2015

## 2015-10-07 NOTE — Progress Notes (Signed)
Speech Language Pathology Treatment: Dysphagia;Cognitive-Linquistic  Patient Details Name: Sean Wilkerson MRN: [id number] DOB: Dec 06, 1945 Today's Date: 10/07/2015 Time: [id number] SLP Time Calculation (min) (ACUTE ONLY): 30 min  Assessment / Plan / Recommendation Clinical Impression  Pt tolerating current diet well with no observed s/s of aspiration during today's session.  Pt able to follow precautions for slowed rate and small bolus size with intermittent verbal cues.  Reviewed locus of stroke and impact on motor function, including dysarthria.  Reviewed strategies to facilitate speech intelligibility.  Pt able to execute with moderate verbal cues, particularly for pacing/respiratory support, rate, and precision. When cued and after immediate review, intelligibility improved to 80% accuracy.  Will continue to follow.  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   HPI HPI: Sean Wilkerson is an 69 y.o. male with a history of hypertension, obesity and syncopal spells, brought to the ED and code stroke status following acute onset of slurred speech along with weakness and numbness involving his right side. Patient was last known well at 7:30 PM tonight. He has no history of stroke nor TIA. He's been taking aspirin 81 mg per day. CT scan of his head showed no acute intracranial abnormality. NIH stroke score was 7. Patient was deemed a candidate for intravenous thrombolytic therapy with TPA, which was administered. Patient was subsequently admitted to neuro intensive care unit for further management. Patient has been experiencing a couple spells of unclear etiology and is currently wearing a long-term cardiac monitor.      SLP Plan  Continue with current plan of care     Recommendations  Diet recommendations: Regular;Thin liquid Liquids provided via: Cup Compensations: Slow rate;Small  sips/bites Postural Changes and/or Swallow Maneuvers: Seated upright 90 degrees              Oral Care Recommendations: Oral care BID Follow up Recommendations: Inpatient Rehab Plan: Continue with current plan of care   Blenda MountsCouture, Sean Wilkerson 10/07/2015, 2:45 PM

## 2015-10-07 NOTE — Progress Notes (Signed)
Inpatient Rehabilitation  I met with the patient at the bedside then phoned his wife to discuss the recommendation of IP Rehab.  I answered their questions and provided informational brochures.  At their request, I have initiated insurance authorization process and will await insurance decision.  I will follow up tomorrow. I have notified wife that in the event that insurance would deny or if I did not have have bed availability, pt. would need a back up plan such as SNF.  I have updated RNCM and CSW.  Please call if questions.  [facility] Admissions Coordinator Cell [phone] Office [phone]

## 2015-10-07 NOTE — Progress Notes (Signed)
PT Cancellation Note  Patient Details Name: Sean BirchwoodLarry Lee Sox MRN: [id number] DOB: 24-Aug-1946   Cancelled Treatment:    Reason Eval/Treat Not Completed: Patient not medically ready.  Pt remains on bedrest at this time.  Please advance activity order when appropriate for mobility.  Will f/u as appropriate.     Sunny SchleinRitenour, [name] F, [place] CarolinaPT [phone] 10/07/2015, 8:26 AM

## 2015-10-07 NOTE — Progress Notes (Signed)
STROKE TEAM PROGRESS NOTE   HISTORY Sean BirchwoodLarry Lee Wilkerson is an 69 y.o. male with a history of hypertension, obesity and syncopal spells, brought to the ED and code stroke status following acute onset of slurred speech along with weakness and numbness involving his right side. Patient was last known well at 7:30 PM tonight. He has no history of stroke nor TIA. He's been taking aspirin 81 mg per day. CT scan of his head showed no acute intracranial abnormality. NIH stroke score was 7. Patient was deemed a candidate for intravenous thrombolytic therapy with TPA, which was administered. Patient was subsequently admitted to neuro intensive care unit for further management. Patient has been experiencing a couple spells of unclear etiology and is currently wearing a long-term cardiac monitor. IV TPA  90 mg Sunday 10/05/2015 at 2045  LSN: 7:30 PM on 10/05/2015 tPA Given: Yes mRankin:   SUBJECTIVE (INTERVAL HISTORY) His  wife is not at the bedside.  Overall he feels his condition is unchanged.  . Blood pressure has been stable. MRI confirms a large left subcortical infarct    OBJECTIVE Temp:  [97.8 F (36.6 C)-98.8 F (37.1 C)] 98.4 F (36.9 C) (12/20 1333) Pulse Rate:  [60-84] 73 (12/20 1333) Cardiac Rhythm:  [-] Normal sinus rhythm (12/20 0800) Resp:  [7-20] 20 (12/20 1333) BP: (103-200)/(51-97) 190/91 mmHg (12/20 1333) SpO2:  [95 %-100 %] 100 % (12/20 1333)  CBC:   Recent Labs Lab 10/05/15 2029 10/05/15 2035  WBC 4.6  --   NEUTROABS 1.9  --   HGB 13.2 13.9  HCT 39.0 41.0  MCV 97.3  --   PLT 255  --     Basic Metabolic Panel:   Recent Labs Lab 10/05/15 2029 10/05/15 2035  NA 138 141  K 4.0 4.0  CL 107 105  CO2 24  --   GLUCOSE 139* 136*  BUN 11 14  CREATININE 0.94 0.90  CALCIUM 8.9  --     Lipid Panel:     Component Value Date/Time   CHOL 205* 10/06/2015 0228   TRIG 131 10/06/2015 0228   HDL 44 10/06/2015 0228   CHOLHDL 4.7 10/06/2015 0228   VLDL 26 10/06/2015  0228   LDLCALC 135* 10/06/2015 0228   HgbA1c:  Lab Results  Component Value Date   HGBA1C 6.0* 10/06/2015   Urine Drug Screen: No results found for: LABOPIA, COCAINSCRNUR, LABBENZ, AMPHETMU, THCU, LABBARB    IMAGING  Ct Head Wo Contrast 10/06/2015   No acute intracranial hemorrhage. Mild age-related atrophy and chronic microvascular ischemic disease.    Ct Head Wo Contrast 10/05/2015   No acute intracranial hemorrhage. Mild chronic microvascular ischemic disease. If symptoms persist and there are no contraindications, MRI may provide better evaluation if clinically indicated.    MRI / MRA -  1. Acute 3 cm oval lacunar infarct extending from the left corona radiata through the left external capsule and posterior left lentiform nuclei. No associated hemorrhage or mass effect. 2. Intracranial atherosclerosis with no emergent large vessel occlusion or proximal stenosis. Mild stenosis at the left MCA bifurcation with otherwise normal visualized left MCA branches. Mild to moderate right PCA P2 segment stenosis with preserved distal flow. 3. 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   PHYSICAL EXAM  Middle-aged obese African-American male not in distress. . Afebrile. Head is nontraumatic. Neck is supple without bruit.    Cardiac exam no murmur or gallop. Lungs are clear to  auscultation. Distal pulses are well felt.  Neurological Exam : Awake alert oriented to time place and person. Mild dysarthria. Moderate right lower facial weakness. Extraocular movements full range except slight restriction of right lateral gaze. Blinks to threat bilaterally. Fundi were not visualized. Vision acuity seems adequate. Tongue deviates to the right. Motor system exam reveals dense right hemiplegia with 0/5 strength. Normal strength in the left. Diminished on the right. Sensation and diminished in the right hemibody distribution. Deep tendon  reflexes absent on the right hand normal on the left. Right plantar upgoing left downgoing. Gait cannot be tested.  NIHSS 13   ASSESSMENT/PLAN [name] is a 69 y.o. male with history of hypertension, obesity and syncopal spells, presenting with slurred speech along with weakness and numbness involving his right side. 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 No cardiac source of emboli identified.  LDL 135  HgbA1c 6.0  VTE prophylaxis - SCDs Diet regular Room service appropriate?: Yes; Fluid consistency:: Thin  aspirin 81 mg daily prior to admission, now on No antithrombotic secondary to TPA  Patient counseled to be compliant with his antithrombotic medications  Ongoing aggressive stroke risk factor management  Therapy recommendations:  Pending  Disposition:  Pending  Hypertension  Stable Permissive hypertension (OK if < 220/120) but gradually normalize in 5-7 days  Hyperlipidemia  Home meds: No lipid lowering medications prior to admission  LDL 135, goal < 70  Add Lipitor 40 mg daily  Continue statin at discharge    Other Stroke Risk Factors  Advanced age  Obesity, Body mass index is 37.59 kg/(m^2).    Other Active Problems    Hospital day # 2    10/07/2015, 1:47 PM I have personally examined this patient, reviewed notes, independently viewed imaging studies, participated in medical decision making and plan of care. I have made any additions or clarifications directly to the above note.  Plan mobilize out of bed with therapy. Transfer to neurology floor bed. Rehabilitation consult. Continue aspirin. Likely transfer to rehabilitation over the next couple  of days after insurance approval    Delia Heady, MD Medical Director [facility] Pager: [phone] 10/07/2015 1:47 PM     To contact Stroke Continuity provider, please refer to [email or web address]. After hours, contact General Neurology

## 2015-10-07 NOTE — Progress Notes (Signed)
VASCULAR LAB PRELIMINARY  PRELIMINARY  PRELIMINARY  PRELIMINARY  Carotid duplex  completed.    Preliminary report:  Bilateral:  1-39% ICA stenosis.  Vertebral artery flow is antegrade.      Jozsef Wescoat, RVT 10/07/2015, 11:37 AM

## 2015-10-07 NOTE — Progress Notes (Signed)
Pt received with no noted distress. Pt stable, neuro intact. Pt denies pain or discomfort. Pt oriented to room. Safety measures in place. Call bell within reach. Will continue to monitor.

## 2015-10-08 ENCOUNTER — Inpatient Hospital Stay (HOSPITAL_COMMUNITY)
Admission: AD | Admit: 2015-10-08 | Discharge: 2015-10-30 | DRG: 057 | Disposition: A | Discharge status: Home Health | Payer: Medicare Other | Source: Intra-hospital | Attending: Physical Medicine & Rehabilitation | Admitting: Physical Medicine & Rehabilitation

## 2015-10-08 ENCOUNTER — Encounter (HOSPITAL_COMMUNITY): Payer: Self-pay | Admitting: Physical Medicine & Rehabilitation

## 2015-10-08 DIAGNOSIS — Z7982 Long term (current) use of aspirin: Secondary | ICD-10-CM

## 2015-10-08 DIAGNOSIS — I10 Essential (primary) hypertension: Secondary | ICD-10-CM | POA: Diagnosis present

## 2015-10-08 DIAGNOSIS — G8191 Hemiplegia, unspecified affecting right dominant side: Secondary | ICD-10-CM

## 2015-10-08 DIAGNOSIS — K5901 Slow transit constipation: Secondary | ICD-10-CM | POA: Insufficient documentation

## 2015-10-08 DIAGNOSIS — I69322 Dysarthria following cerebral infarction: Secondary | ICD-10-CM | POA: Diagnosis not present

## 2015-10-08 DIAGNOSIS — I639 Cerebral infarction, unspecified: Secondary | ICD-10-CM | POA: Diagnosis present

## 2015-10-08 DIAGNOSIS — I69359 Hemiplegia and hemiparesis following cerebral infarction affecting unspecified side: Secondary | ICD-10-CM

## 2015-10-08 DIAGNOSIS — M792 Neuralgia and neuritis, unspecified: Secondary | ICD-10-CM | POA: Diagnosis not present

## 2015-10-08 DIAGNOSIS — I69391 Dysphagia following cerebral infarction: Secondary | ICD-10-CM

## 2015-10-08 DIAGNOSIS — R609 Edema, unspecified: Secondary | ICD-10-CM | POA: Diagnosis not present

## 2015-10-08 DIAGNOSIS — R29707 NIHSS score 7: Secondary | ICD-10-CM | POA: Diagnosis present

## 2015-10-08 DIAGNOSIS — I6330 Cerebral infarction due to thrombosis of unspecified cerebral artery: Secondary | ICD-10-CM | POA: Diagnosis not present

## 2015-10-08 DIAGNOSIS — I69351 Hemiplegia and hemiparesis following cerebral infarction affecting right dominant side: Secondary | ICD-10-CM | POA: Diagnosis present

## 2015-10-08 DIAGNOSIS — I69320 Aphasia following cerebral infarction: Secondary | ICD-10-CM | POA: Diagnosis not present

## 2015-10-08 DIAGNOSIS — E785 Hyperlipidemia, unspecified: Secondary | ICD-10-CM

## 2015-10-08 DIAGNOSIS — Z79899 Other long term (current) drug therapy: Secondary | ICD-10-CM | POA: Diagnosis not present

## 2015-10-08 DIAGNOSIS — I638 Other cerebral infarction: Secondary | ICD-10-CM

## 2015-10-08 HISTORY — DX: Hyperlipidemia, unspecified: E78.5

## 2015-10-08 HISTORY — DX: Essential (primary) hypertension: I10

## 2015-10-08 MED ORDER — ALUM & MAG HYDROXIDE-SIMETH 200-200-20 MG/5ML PO SUSP: 30.0000 mL | ORAL | Status: DC | PRN

## 2015-10-08 MED ORDER — AMLODIPINE BESYLATE 5 MG PO TABS: 5.0000 mg | ORAL_TABLET | Freq: Every day | ORAL | Status: DC

## 2015-10-08 MED ORDER — PANTOPRAZOLE SODIUM 40 MG PO TBEC: 40.0000 mg | DELAYED_RELEASE_TABLET | Freq: Every day | ORAL | Status: DC

## 2015-10-08 MED ORDER — PROCHLORPERAZINE EDISYLATE 5 MG/ML IJ SOLN: 5.0000 mg | Freq: Four times a day (QID) | INTRAMUSCULAR | Status: DC | PRN

## 2015-10-08 MED ORDER — CARVEDILOL 6.25 MG PO TABS
6.2500 mg | ORAL_TABLET | Freq: Two times a day (BID) | ORAL | Status: DC
  Administered 2015-10-09 – 2015-10-13 (×9): 6.25 mg via ORAL
  Filled 2015-10-08 (×9): qty 1

## 2015-10-08 MED ORDER — CARVEDILOL 6.25 MG PO TABS: 6.2500 mg | ORAL_TABLET | Freq: Two times a day (BID) | ORAL | Status: DC

## 2015-10-08 MED ORDER — TRAZODONE HCL 50 MG PO TABS
25.0000 mg | ORAL_TABLET | Freq: Every evening | ORAL | Status: DC | PRN
Start: 2015-10-08 — End: 2015-10-30
  Administered 2015-10-10 – 2015-10-29 (×20): 50 mg via ORAL
  Filled 2015-10-08 (×20): qty 1

## 2015-10-08 MED ORDER — METHOCARBAMOL 500 MG PO TABS
500.0000 mg | ORAL_TABLET | Freq: Four times a day (QID) | ORAL | Status: DC | PRN
  Administered 2015-10-14 – 2015-10-20 (×9): 500 mg via ORAL
  Filled 2015-10-08 (×9): qty 1

## 2015-10-08 MED ORDER — SENNOSIDES-DOCUSATE SODIUM 8.6-50 MG PO TABS: 1.0000 | ORAL_TABLET | Freq: Every evening | ORAL | Status: DC | PRN

## 2015-10-08 MED ORDER — PROCHLORPERAZINE 25 MG RE SUPP: 12.5000 mg | Freq: Four times a day (QID) | RECTAL | Status: DC | PRN

## 2015-10-08 MED ORDER — ATORVASTATIN CALCIUM 40 MG PO TABS: 40.0000 mg | ORAL_TABLET | Freq: Every day | ORAL | Status: DC

## 2015-10-08 MED ORDER — ACETAMINOPHEN 325 MG PO TABS
650.0000 mg | ORAL_TABLET | ORAL | Status: DC | PRN
  Administered 2015-10-16 – 2015-10-29 (×8): 650 mg via ORAL
  Filled 2015-10-08 (×10): qty 2

## 2015-10-08 MED ORDER — ASPIRIN 325 MG PO TABS
325.0000 mg | ORAL_TABLET | Freq: Every day | ORAL | Status: DC
  Administered 2015-10-09 – 2015-10-30 (×22): 325 mg via ORAL
  Filled 2015-10-08 (×22): qty 1

## 2015-10-08 MED ORDER — ACETAMINOPHEN 325 MG PO TABS: 325.0000 mg | ORAL_TABLET | ORAL | Status: DC | PRN

## 2015-10-08 MED ORDER — PANTOPRAZOLE SODIUM 40 MG PO TBEC
40.0000 mg | DELAYED_RELEASE_TABLET | Freq: Every day | ORAL | Status: DC
  Administered 2015-10-08 – 2015-10-29 (×22): 40 mg via ORAL
  Filled 2015-10-08 (×22): qty 1

## 2015-10-08 MED ORDER — PROCHLORPERAZINE MALEATE 5 MG PO TABS
5.0000 mg | ORAL_TABLET | Freq: Four times a day (QID) | ORAL | Status: DC | PRN
  Administered 2015-10-09: 5 mg via ORAL
  Administered 2015-10-14: 10 mg via ORAL
  Filled 2015-10-08: qty 2
  Filled 2015-10-08: qty 1

## 2015-10-08 MED ORDER — ASPIRIN 325 MG PO TABS: 325.0000 mg | ORAL_TABLET | Freq: Every day | ORAL | Status: DC

## 2015-10-08 MED ORDER — DIPHENHYDRAMINE HCL 12.5 MG/5ML PO ELIX
12.5000 mg | ORAL_SOLUTION | Freq: Four times a day (QID) | ORAL | Status: DC | PRN
Start: 2015-10-08 — End: 2015-10-30

## 2015-10-08 MED ORDER — GUAIFENESIN-DM 100-10 MG/5ML PO SYRP: 5.0000 mL | ORAL_SOLUTION | Freq: Four times a day (QID) | ORAL | Status: DC | PRN

## 2015-10-08 NOTE — Interval H&P Note (Signed)
Sean Wilkerson was admitted today to Inpatient Rehabilitation with the diagnosis of left corona radiata, external capsule, and posterior left lentiform nuclei infarct.  The patient's history has been reviewed, patient examined, and there is no change in status.  Patient continues to be appropriate for intensive inpatient rehabilitation.  I have reviewed the patient's chart and labs.  Questions were answered to the patient's satisfaction. The PAPE has been reviewed and assessment remains appropriate.  Ankit Karis Jubanil Patel 10/08/2015, 10:31 PM

## 2015-10-08 NOTE — Progress Notes (Signed)
Physical Therapy Treatment Patient Details Name: Sean BirchwoodLarry Lee Wilkerson MRN: [id number] DOB: 1946/07/19 Today's Date: 10/08/2015    History of Present Illness pt presents with large L Subcortical Infarct.  pt with hx of HTN and Syncope.      PT Comments    Patient tolerated work in sitting on EOB today, but limited standing due to pt report of dizziness (BP 153/87).  Feel continued skilled PT in acute setting indicated to progress to CIR level rehab prior to d/c home.  Follow Up Recommendations  CIR     Equipment Recommendations  None recommended by PT    Recommendations for Other Services       Precautions / Restrictions Precautions Precautions: Fall Restrictions Weight Bearing Restrictions: No    Mobility  Bed Mobility Overal bed mobility: Needs Assistance;+2 for physical assistance Bed Mobility: Rolling;Sidelying to Sit Rolling: Mod assist Sidelying to sit: Mod assist       General bed mobility comments: assist for R UE and rolling hip, then assist for R leg off bed and to lift trunk  Transfers Overall transfer level: Needs assistance Equipment used: 2 person hand held assist Transfers: Squat Pivot Transfers Sit to Stand: Mod assist;+2 physical assistance Stand pivot transfers: Max assist;+2 physical assistance       General transfer comment: pivot to chair on R side assist for scooting to EOB and placing R LE  Ambulation/Gait                 Stairs            Wheelchair Mobility    Modified Rankin (Stroke Patients Only) Modified Rankin (Stroke Patients Only) Pre-Morbid Rankin Score: No symptoms Modified Rankin: Severe disability     Balance Overall balance assessment: Needs assistance Sitting-balance support: Feet supported;Single extremity supported Sitting balance-Leahy Scale: Fair Sitting balance - Comments: cues and frequent assist with head position to prevent R LOB/pushing; worked on trunk rotation, upper trunk extension and cues  for midline head position sitting EOB about 10 minutes Postural control: Right lateral lean Standing balance support: Single extremity supported;During functional activity Standing balance-Leahy Scale: Poor Standing balance comment: NT due to pt c/o dizziness in sitting EOB                    Cognition Arousal/Alertness: Awake/alert Behavior During Therapy: Flat affect Overall Cognitive Status: Within Functional Limits for tasks assessed                      Exercises General Exercises - Upper Extremity Shoulder Flexion: PROM;5 reps;Seated Shoulder Extension: PROM;5 reps;Seated Elbow Flexion: PROM;5 reps;Seated Elbow Extension: PROM;5 reps;Seated    General Comments General comments (skin integrity, edema, etc.): Wife present for session. Discussed need for pillows underneath RUE at all times.      Pertinent Vitals/Pain Pain Assessment: No/denies pain    Home Living Family/patient expects to be discharged to:: Private residence Living Arrangements: Spouse/significant other Available Help at Discharge: Family;Available 24 hours/day;Other (Comment) Type of Home: House Home Access: Stairs to enter Entrance Stairs-Rails: None Home Layout: Two level;Able to live on main level with bedroom/bathroom        Prior Function Level of Independence: Independent      Comments: Works as a Education officer, [employer].   PT Goals (current goals can now be found in the care plan section) Acute Rehab PT Goals Patient Stated Goal: to get going with rehab Progress towards PT goals: Progressing toward goals    Frequency  Min 4X/week    PT Plan Current plan remains appropriate    Co-evaluation             End of Session Equipment Utilized During Treatment: Gait belt Activity Tolerance: Patient limited by fatigue;Other (comment) (limited by dizziness) Patient left: in chair;with chair alarm set;with call bell/phone within reach (positioned with pillow under R UE)     Time:  9147-8295 PT Time Calculation (min) (ACUTE ONLY): 27 min  Charges:  $Therapeutic Activity: 23-37 mins                    G Codes:      Elray Mcgregor 10/29/2015, 11:13 AM  Sheran Lawless, PT [phone] October 29, 2015

## 2015-10-08 NOTE — H&P (View-Only) (Signed)
Physical Medicine and Rehabilitation Admission H&P    Chief Complaint  Patient presents with  . Right sided weakness   HPI:    Sean Wilkerson is a 69 y.o. left handed male with history of HTN, recent syncopal episode (being evaluated with cardiac monitor) who was admitted on 10/05/15 with right sided weakness and slurred speech. CT head negative and he was treated with tPA for NIHSS 7. 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 2D echo with EF 55-60% with no wall abnormality. Carotid dopplers without ICA stenosis.Dr. Pearlean Brownie felt that patient had left brain subcortical stroke due to small vessel disease and to continue ASA for secondary stroke prevention.  Swallow evaluation done yesterday and patient started on regular diet, thin liquids with aspiration precautions for mild pharyngeal dysphagia. Patient with resultant right hemiparesis, moderate to severe dysarthria and dysphagia. Therapy evaluations done yesterday and CIR recommended for follow up therapy.   Review of Systems  HENT: Negative for hearing loss.   Eyes: Negative for blurred vision and double vision.  Respiratory: Negative for cough and shortness of breath.   Cardiovascular: Negative for chest pain and palpitations.  Gastrointestinal: Negative for heartburn, nausea, abdominal pain and constipation.  Genitourinary: Positive for frequency. Negative for dysuria and urgency.  Musculoskeletal: Negative for myalgias and back pain.  Skin: Negative for itching.  Neurological: Positive for dizziness (with activity), sensory change, speech change, focal weakness and weakness. Negative for headaches.  Psychiatric/Behavioral: The patient is not nervous/anxious and does not have insomnia.   All other systems reviewed and are  negative.    Past Medical History  Diagnosis Date  . Hypertension     Past Surgical History  Procedure Laterality Date  . No past surgeries      History reviewed. No pertinent family history.    Social History:  Married. Works fulltime as a Education officer, environmental at [employer]. Independent without AD. Wife works part time as a [employer]. He reports that he has never smoked. He does not have any smokeless tobacco history on file. He reports that he does not drink alcohol or use illicit drugs.    Allergies: No Known Allergies    Medications Prior to Admission  Medication Sig Dispense Refill  . amLODipine (NORVASC) 5 MG tablet Take 5 mg by mouth daily.  1  . aspirin EC 81 MG tablet Take 81 mg by mouth daily.    . carvedilol (COREG) 6.25 MG tablet Take 6.25 mg by mouth 2 (two) times daily.  1    Home: Home Living Family/patient expects to be discharged to:: Private residence Living Arrangements: Spouse/significant other Available Help at Discharge: Family, Available 24 hours/day, Other (Comment) Type of Home: House Home Access: Stairs to enter [employer] of Steps: 4 Entrance Stairs-Rails: None Home Layout: Two level, Able to live on main level with bedroom/bathroom Bathroom Shower/Tub: Psychologist, counselling, Door [employer] Toilet: Standard Bathroom Accessibility: Yes   Functional History: Prior Function Level of Independence: Independent Comments: Works as a Education officer, environmental.  Functional Status:  Mobility: Bed Mobility Overal bed mobility: Needs Assistance, +2 for physical assistance Bed Mobility: Rolling, Sidelying to Sit Rolling: Mod assist Sidelying to sit: Mod assist Supine to sit: Mod assist, +2 for physical assistance, HOB elevated Sit to supine: Mod assist, +2 for physical assistance General bed mobility  comments: assist for R UE and rolling hip, then assist for R leg off bed and to lift trunk Transfers Overall transfer level: Needs assistance Equipment used: 2 person hand  held assist Transfers: Squat Pivot Transfers Sit to Stand: Mod assist, +2 physical assistance Stand pivot transfers: Max assist, +2 physical assistance General transfer comment: pivot to chair on R side assist for scooting to EOB and placing R LE      ADL: ADL Overall ADL's : Needs assistance/impaired Toilet Transfer: Maximal assistance, +2 for physical assistance, Cueing for sequencing, Stand-pivot, BSC Toileting- Clothing Manipulation and Hygiene: Maximal assistance, +2 for physical assistance, Cueing for sequencing, Sit to/from stand Functional mobility during ADLs: Maximal assistance, +2 for physical assistance, Cueing for sequencing General ADL Comments: Max +2 assist for transfers and pericare. Verbal cues for awareness and to protect RUE at all times. Showed pt and wife how to support RUE in sitting and laying positions to avoid shoulder subluxation and increased edema.  Cognition: Cognition Overall Cognitive Status: Within Functional Limits for tasks assessed Arousal/Alertness: Awake/alert Orientation Level: Oriented X4 Attention: Sustained Sustained Attention: Appears intact Memory: Appears intact Awareness: Appears intact Problem Solving: Appears intact Cognition Arousal/Alertness: Awake/alert Behavior During Therapy: Flat affect Overall Cognitive Status: Within Functional Limits for tasks assessed   Blood pressure 181/66, pulse 75, temperature 98.6 F (37 C), temperature source Oral, resp. rate 18, height  (1.753 m), weight 115.5 kg (254 lb 10.1 oz), SpO2 92 %. Physical Exam  Nursing note and vitals reviewed. Constitutional: He is oriented to person, place, and time. He appears well-developed and well-nourished.  HENT:  Head: Normocephalic and atraumatic.  Mouth/Throat: Oropharynx is clear and moist.  Wears full set dentures.   Eyes: Conjunctivae and EOM are normal. Pupils are equal, round, and reactive to light. Right eye exhibits no discharge. Left eye  exhibits no discharge.  Neck: Normal range of motion. Neck supple.  Cardiovascular: Normal rate and regular rhythm.   No murmur heard. Respiratory: Effort normal and breath sounds normal. No respiratory distress. He has no wheezes.  GI: Soft. Bowel sounds are normal. He exhibits no distension. There is no tenderness.  Musculoskeletal: He exhibits edema (RUE). He exhibits no tenderness.  Neurological: He is alert and oriented to person, place, and time. He has normal reflexes.  Right hemifacial weakness and tongue deviation to right. Dysphonic with moderate dysarthria.  Has good awareness and insight into deficits.  LUE/LLE: 5/5 proximal to distal RUE/RLE: 0/5 proximal to distal  Skin: Skin is warm and dry.  Psychiatric: He has a normal mood and affect. His behavior is normal. Judgment and thought content normal.    Results for orders placed or performed during the hospital encounter of 10/05/15 (from the past 48 hour(s))  Glucose, capillary     Status: Abnormal   Collection Time: 10/07/15  4:24 PM  Result Value Ref Range   Glucose-Capillary 131 (H) 65 - 99 mg/dL  Glucose, capillary     Status: Abnormal   Collection Time: 10/07/15 10:26 PM  Result Value Ref Range   Glucose-Capillary 124 (H) 65 - 99 mg/dL   Mr Brain Wo Contrast  10/06/2015  CLINICAL DATA:  69 year old male with new onset slurred speech and right side weakness, code stroke status post IV tPA. Initial encounter. EXAM: MRI HEAD WITHOUT CONTRAST MRA HEAD WITHOUT CONTRAST TECHNIQUE: Multiplanar, multiecho pulse sequences of the brain and surrounding structures were obtained without intravenous contrast. Angiographic images of the head were obtained using MRA technique without contrast.  COMPARISON:  Head CT without contrast 0134 hours today, and 10/05/2015. FINDINGS: MRI HEAD FINDINGS 3 cm oval lacunar infarct with restricted diffusion tracking from the left corona radiata through the posterior limb of the external capsule and  posterior left lentiform nuclei. Associated T2 and FLAIR hyperintensity. No associated hemorrhage or mass effect. No other restricted diffusion identified. Major intracranial vascular flow voids are within normal limits. Small chronic lacunar infarct in the posterior left cerebellum. Scattered and patchy cerebral white matter T2 and FLAIR hyperintensity outside of the acute findings. No cerebral cortical encephalomalacia. Possible chronic microhemorrhage versus vessel artifact in the left sensory strip on series 9, image 21. No midline shift, mass effect, evidence of mass lesion, ventriculomegaly, extra-axial collection or acute intracranial hemorrhage. Cervicomedullary junction and pituitary are within normal limits. Negative visualized cervical spine. Normal bone marrow signal. Visible internal auditory structures appear normal. 8 mastoids are clear. Mild paranasal sinus mucosal thickening. Leftward gaze deviation, otherwise negative orbit soft tissues. No acute scalp soft tissue findings, left posterior scalp soft tissue scarring. MRA HEAD FINDINGS Codominant distal vertebral arteries with antegrade flow in the posterior circulation. Normal PICA origins. Normal vertebrobasilar junction. No basilar stenosis. Normal SCA and PCA origins. Posterior communicating arteries are diminutive or absent. Mild to moderate stenosis in the right PCA P2 segment. Otherwise bilateral PCA branches are within normal limits. Antegrade flow in both ICA siphons. No siphon stenosis. Patent carotid termini. Normal ophthalmic artery origins. Normal MCA and ACA origins. Diminutive or absent anterior communicating artery. Visualized ACA branches are within normal limits. Mild irregularity and stenosis of both the distal right M1 segment and at the left MCA bifurcation, but otherwise visualized bilateral MCA branches are within normal limits. IMPRESSION: 1. Acute 3 cm oval lacunar infarct extending from the left corona radiata through the  left external capsule and posterior left lentiform nuclei. No associated hemorrhage or mass effect. 2. Intracranial atherosclerosis with no emergent large vessel occlusion or proximal stenosis. Mild stenosis at the left MCA bifurcation with otherwise normal visualized left MCA branches. Mild to moderate right PCA P2 segment stenosis with preserved distal flow. 3. 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 Electronically Signed   By: Odessa FlemingH  Hall M.D.   On: 10/06/2015 18:48   Mr Maxine GlennMra Head/brain Wo Cm  10/06/2015  CLINICAL DATA:  69 year old male with new onset slurred speech and right side weakness, code stroke status post IV tPA. Initial encounter. EXAM: MRI HEAD WITHOUT CONTRAST MRA HEAD WITHOUT CONTRAST TECHNIQUE: Multiplanar, multiecho pulse sequences of the brain and surrounding structures were obtained without intravenous contrast. Angiographic images of the head were obtained using MRA technique without contrast. COMPARISON:  Head CT without contrast 0134 hours today, and 10/05/2015. FINDINGS: MRI HEAD FINDINGS 3 cm oval lacunar infarct with restricted diffusion tracking from the left corona radiata through the posterior limb of the external capsule and posterior left lentiform nuclei. Associated T2 and FLAIR hyperintensity. No associated hemorrhage or mass effect. No other restricted diffusion identified. Major intracranial vascular flow voids are within normal limits. Small chronic lacunar infarct in the posterior left cerebellum. Scattered and patchy cerebral white matter T2 and FLAIR hyperintensity outside of the acute findings. No cerebral cortical encephalomalacia. Possible chronic microhemorrhage versus vessel artifact in the left sensory strip on series 9, image 21. No midline shift, mass effect, evidence of mass lesion, ventriculomegaly, extra-axial collection or acute intracranial hemorrhage. Cervicomedullary junction and pituitary are within  normal limits. Negative visualized  cervical spine. Normal bone marrow signal. Visible internal auditory structures appear normal. 8 mastoids are clear. Mild paranasal sinus mucosal thickening. Leftward gaze deviation, otherwise negative orbit soft tissues. No acute scalp soft tissue findings, left posterior scalp soft tissue scarring. MRA HEAD FINDINGS Codominant distal vertebral arteries with antegrade flow in the posterior circulation. Normal PICA origins. Normal vertebrobasilar junction. No basilar stenosis. Normal SCA and PCA origins. Posterior communicating arteries are diminutive or absent. Mild to moderate stenosis in the right PCA P2 segment. Otherwise bilateral PCA branches are within normal limits. Antegrade flow in both ICA siphons. No siphon stenosis. Patent carotid termini. Normal ophthalmic artery origins. Normal MCA and ACA origins. Diminutive or absent anterior communicating artery. Visualized ACA branches are within normal limits. Mild irregularity and stenosis of both the distal right M1 segment and at the left MCA bifurcation, but otherwise visualized bilateral MCA branches are within normal limits. IMPRESSION: 1. Acute 3 cm oval lacunar infarct extending from the left corona radiata through the left external capsule and posterior left lentiform nuclei. No associated hemorrhage or mass effect. 2. Intracranial atherosclerosis with no emergent large vessel occlusion or proximal stenosis. Mild stenosis at the left MCA bifurcation with otherwise normal visualized left MCA branches. Mild to moderate right PCA P2 segment stenosis with preserved distal flow. 3. 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 Electronically Signed   By: Odessa Fleming M.D.   On: 10/06/2015 18:48       Medical Problem List and Plan: 1.  Hemiplegia, dysarthria, dysphagia secondary to left corona radiata, external capsule, and posterior left lentiform nuclei infarct 2.   DVT Prophylaxis/Anticoagulation: Mechanical: Sequential compression devices, below knee Bilateral lower extremities 3. Pain Management: Tylenol prn.  4. Mood: LCSW to follow for evaluation and support.  5. Neuropsych: This patient is capable of making decisions on hid own behalf. 6. Skin/Wound Care: Routine pressure relief measures.  7. Fluids/Electrolytes/Nutrition: Monitor I/O. Check lytes in am. Offer supplements between meals if intake poor.  8. HTN: Monitor BP tid with permissive HTN and gradual normalization over next 5 days. Continue norvasc and coreg daily. Will check orthostatic vitals due to reports of dizziness with activity.  9. Dyslipidemia: Cotinue Lipitor daily.   Post Admission Physician Evaluation: 1. Functional deficits secondary to left corona radiata, external capsule, and posterior left lentiform nuclei infarct. 2. Patient is admitted to receive collaborative, interdisciplinary care between the physiatrist, rehab nursing staff, and therapy team. 3. Patient's level of medical complexity and substantial therapy needs in context of that medical necessity cannot be provided at a lesser intensity of care such as a SNF. 4. Patient has experienced substantial functional loss from his/her baseline which was documented above under the "Functional History" and "Functional Status" headings.  Judging by the patient's diagnosis, physical exam, and functional history, the patient has potential for functional progress which will result in measurable gains while on inpatient rehab.  These gains will be of substantial and practical use upon discharge  in facilitating mobility and self-care at the household level. 5. Physiatrist will provide 24 hour management of medical needs as well as oversight of the therapy plan/treatment and provide guidance as appropriate regarding the interaction of the two. 6. 24 hour rehab nursing will assist with bladder management, bowel management, safety, skin/wound  care, disease management, medication administration, pain management and patient education and help integrate therapy concepts, techniques,education, etc. 7. PT will assess and treat for/with: Lower extremity strength, range  of motion, stamina, balance, functional mobility, safety, adaptive techniques and equipment, coping skills, pain control, stroke education.   Goals are: Min A/superivions. 8. OT will assess and treat for/with: ADL's, functional mobility, safety, upper extremity strength, adaptive techniques and equipment, ego support, and community reintegration.   Goals are: Min A/Supervision. Therapy may proceed with showering this patient. 9. SLP will assess and treat for/with: swallowing and speech.  Goals are: Mod I. 10. Case Management and Social Worker will assess and treat for psychological issues and discharge planning. 11. Team conference will be held weekly to assess progress toward goals and to determine barriers to discharge. 12. Patient will receive at least 3 hours of therapy per day at least 5 days per week. 13. ELOS: 20-24 days.       14. Prognosis:  good  Maryla Morrow, MD 10/08/2015

## 2015-10-08 NOTE — Progress Notes (Signed)
Sean Wilkerson Rehab Admission Coordinator Signed Physical Medicine and Rehabilitation PMR Pre-admission 10/08/2015 10:17 AM  Related encounter: ED to Hosp-Admission (Current) from 10/05/2015 in [facility] Collapse All   PMR Admission Coordinator Pre-Admission Assessment  Patient: Sean Wilkerson is an 69 y.o., male MRN: [id number] DOB: [birth year] Height: _0  (175.3 cm) Weight: 115.5 kg (254 lb 10.1 oz)  Insurance Information HMO: PPO: yes PCP: IPA: 80/20: OTHER:  PRIMARY: [employer] Medicare Policy#: [id number] Subscriber: self CM Name: Orvan July Phone#: [phone] Fax#:  Pre-Cert#: [id number], approved for admission to IP Rehab. Reviewer Sherlynn Stalls to follow up with EMR access ; phone [phone], fax [phone] Employer: Self employed Benefits: Phone #: [phone] Name: Online and Letta Median. Date: 10/18/14 Deduct: $200 Out of Pocket Max: $2200 Life Max: none CIR: $0 copay once deductible met SNF: $0 copay if deductible met  Outpatient: $0 copay if deductible met Co-Pay:  Home Health: $0 copay if deductible met Co-Pay:  DME: $0 if deductible met Co-Pay:  Providers: in network SECONDARY: none Policy#: Subscriber:  CM Name: Phone#: Fax#:  Pre-Cert#: Employer:  Benefits: Phone #: Name:  Eff. Date: Deduct: Out of Pocket Max: Life Max:  CIR: SNF:  Outpatient: Co-Pay:  Home Health: Co-Pay:  DME: Co-Pay:   Medicaid Application Date: Case Manager:  Disability Application Date: Case Worker:   Emergency Contact Information Contact Information    Name Relation Home Work Mobile    [place] Spouse [phone]       Current Medical History  Patient Admitting Diagnosis:left corona radiata/external capsule/left lentiform nuclei  History of Present Illness: Sean Wilkerson is a 69 y.o. left handed male with history of HTN, recent syncopal episode (being evaluated with cardiac monitor) who was admitted on 10/05/15 with right sided weakness and slurred speech. CT head negative and he was treated with tPA for NIHSS 7. 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 2D echo with EF 55-60% with no wall abnormality. Carotid dopplers without ICA stenosis.Dr. Leonie Man felt that patient had left brain subcortical stroke due to small vessel disease and to continue ASA for secondary stroke prevention. Swallow evaluation done yesterday and patient started on regular diet, thin liquids with aspiration precautions for mild pharyngeal dysphagia. Patient with resultant right hemiparesis, moderate to severe dysarthria and dysphagia. Therapy evaluations done yesterday and CIR recommended for follow up therapy Total: 14 NIH    Past Medical History  Past Medical History  Diagnosis Date  . Hypertension     Family History  family history is not on file.  Prior Rehab/Hospitalizations:  Has the patient had major surgery during 100 days prior to admission? No  Current Medications   Current facility-administered medications:  . 0.9 % sodium chloride infusion, , Intravenous, Continuous, Wallie Char, Last Rate: 75 mL/hr at 10/08/15 0768 . acetaminophen (TYLENOL) tablet 650 mg, 650 mg, Oral, Q4H PRN **OR** [DISCONTINUED] acetaminophen (TYLENOL) suppository 650 mg, 650 mg, Rectal, Q4H PRN, Wallie Char . amLODipine (NORVASC) tablet 5 mg, 5 mg, Oral, Daily, Garvin Fila, MD, 5 mg at 10/08/15 0930 . aspirin tablet 325 mg, 325 mg, Oral, Daily, Drema Dallas, DO, 325 mg at 10/08/15 0930 . carvedilol (COREG) tablet 6.25 mg, 6.25 mg, Oral, BID WC, Garvin Fila, MD, 6.25 mg  at 10/08/15 0748 . labetalol (NORMODYNE,TRANDATE) injection 10 mg, 10 mg, Intravenous, Q10 min PRN, Wallie Char, 10 mg at 10/08/15 1355 . pantoprazole (PROTONIX) EC tablet 40 mg, 40 mg, Oral, QHS, Garvin Fila, MD  Patients Current Diet: Diet regular Room service appropriate?: Yes; Fluid consistency:: Thin  Precautions / Restrictions Precautions Precautions: Fall Restrictions Weight Bearing Restrictions: No   Has the patient had 2 or more falls or a fall with injury in the past year?No  Prior Activity Level Community (5-7x/wk): Per wife, pt. is out of the home every day. 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   Home Assistive Devices / Equipment Home Assistive Devices/Equipment: None  Prior Device Use: Indicate devices/aids used by the patient prior to current illness, exacerbation or injury? None of the above; no device needed PTA  Prior Functional Level Prior Function Level of Independence: Independent Comments: Works as a Theme park manager.  Self Care: Did the patient need help bathing, dressing, using the toilet or eating? Independent  Indoor Mobility: Did the patient need assistance with walking from room to room (with or without device)? Independent  Stairs: Did the patient need assistance with internal or external stairs (with or without device)? Independent  Functional Cognition: Did the patient need help planning regular tasks such as shopping or remembering to take medications? Independent  Current Functional Level Cognition  Arousal/Alertness: Awake/alert Overall Cognitive Status: Within Functional Limits for tasks assessed Orientation Level: Oriented X4 Attention: Sustained Sustained Attention: Appears intact Memory: Appears  intact Awareness: Appears intact Problem Solving: Appears intact   Extremity Assessment (includes Sensation/Coordination)  Upper Extremity Assessment: RUE deficits/detail RUE Deficits / Details: strength near flaccid, diminished sensation but not absent, no shoulder subluxation RUE Sensation: decreased light touch, decreased proprioception RUE Coordination: decreased fine motor, decreased gross motor  Lower Extremity Assessment: Defer to PT evaluation RLE Deficits / Details: Strength near flaccid. Does demonstrate trace hamstrings and hip hike. Sensation diminished, but not absent.  RLE Sensation: decreased light touch RLE Coordination: decreased fine motor, decreased gross motor    ADLs  Overall ADL's : Needs assistance/impaired Toilet Transfer: Maximal assistance, +2 for physical assistance, Cueing for sequencing, Stand-pivot, BSC Toileting- Clothing Manipulation and Hygiene: Maximal assistance, +2 for physical assistance, Cueing for sequencing, Sit to/from stand Functional mobility during ADLs: Maximal assistance, +2 for physical assistance, Cueing for sequencing General ADL Comments: Max +2 assist for transfers and pericare. Verbal cues for awareness and to protect RUE at all times. Showed pt and wife how to support RUE in sitting and laying positions to avoid shoulder subluxation and increased edema.    Mobility  Overal bed mobility: Needs Assistance, +2 for physical assistance Bed Mobility: Rolling, Sidelying to Sit Rolling: Mod assist Sidelying to sit: Mod assist Supine to sit: Mod assist, +2 for physical assistance, HOB elevated Sit to supine: Mod assist, +2 for physical assistance General bed mobility comments: assist for R UE and rolling hip, then assist for R leg off bed and to lift trunk    Transfers  Overall transfer level: Needs assistance Equipment used: 2 person hand held assist Transfers: Squat Pivot Transfers Sit to Stand: Mod assist, +2 physical  assistance Stand pivot transfers: Max assist, +2 physical assistance General transfer comment: pivot to chair on R side assist for scooting to EOB and placing R LE    Ambulation / Gait / Stairs / Wheelchair Mobility       Posture / Balance Dynamic Sitting Balance Sitting balance - Comments: cues and frequent  assist with head position to prevent R LOB/pushing; worked on trunk rotation, upper trunk extension and cues for midline head position sitting EOB about 10 minutes Balance Overall balance assessment: Needs assistance Sitting-balance support: Feet supported, Single extremity supported Sitting balance-Leahy Scale: Fair Sitting balance - Comments: cues and frequent assist with head position to prevent R LOB/pushing; worked on trunk rotation, upper trunk extension and cues for midline head position sitting EOB about 10 minutes Postural control: Right lateral lean Standing balance support: Single extremity supported, During functional activity Standing balance-Leahy Scale: Poor Standing balance comment: NT due to pt c/o dizziness in sitting EOB    Special needs/care consideration BiPAP/CPAP no CPM no Continuous Drip IV no Dialysis no  Life Vest no Oxygen no Special Bed no Trach Size no Wound Vac (area) no  Skin WDL per nursing flowsheet  Bowel mgmt: Morning of admission per pt. (10/05/15) Bladder mgmt:  Diabetic mgmt A1c 6.0     Previous Home Environment Living Arrangements: Spouse/significant other Available Help at Discharge: Family, Available 24 hours/day, Other (Comment) Type of Home: House Home Layout: Two level, Able to live on main level with bedroom/bathroom Home Access: Stairs to enter Entrance Stairs-Rails: None Entrance Stairs-Number of Steps: 4 Bathroom Shower/Tub: Gaffer, Charity fundraiser: Standard Bathroom Accessibility: Yes How Accessible: Accessible via walker, Other  (comment) (per wife, possibly accessible to W/C) [place]: No  Discharge Living Setting Plans for Discharge Living Setting: Patient's home Type of Home at Discharge: House Discharge Home Layout: Two level, Able to live on main level with bedroom/bathroom Alternate Level Stairs-Rails: (1 rail) Discharge Home Access: Stairs to enter Entrance Stairs-Rails: (one rail) Technical brewer of Steps: 4 Discharge Bathroom Shower/Tub: Horticulturist, commercial: Standard Discharge Bathroom Accessibility: Yes How Accessible: Accessible via walker (per wife, may be accessible for W/C) Does the patient have any problems obtaining your medications?: No  Social/Family/Support Systems Patient Roles: Spouse, Parent Contact Information: Macarthur L[place], wife [phone] Anticipated Caregiver: Wife will be primary caregiver and understands that pt. will need 24 hour assist upon DC from rehab. She states she will begin to work on arranging this care. Ability/Limitations of Caregiver: wife works part time Careers adviser: Intermittent Discharge Plan Discussed with Primary Caregiver: Yes Is Caregiver In Agreement with Plan?: Yes Does Caregiver/Family have Issues with Lodging/Transportation while Pt is in Rehab?: No   Goals/Additional Needs Patient/Family Goal for Rehab: supervision and min assist for PT/OT, modified independent SLP Expected length of stay: 20-25 days Cultural Considerations: none Dietary Needs: regualr diet, thin liquids, one sip at a time to avoid aspiration Equipment Needs: TBA Pt/Family Agrees to Admission and willing to participate: Yes Program Orientation Provided & Reviewed with Pt/Caregiver Including Roles & Responsibilities: Yes   Decrease burden of Care through IP rehab admission: no   Possible need for SNF placement upon discharge: Not anticipated   Patient Condition: This patient's condition remains as documented in the  consult dated 10/08/2015 , in which the Rehabilitation Physician determined and documented that the patient's condition is appropriate for intensive rehabilitative care in an inpatient rehabilitation facility. Will admit to inpatient rehab today.  Preadmission Screen Completed By: Sean Wilkerson, 10/08/2015 2:34 PM ______________________________________________________________________  Discussed status with Dr. Posey Pronto on 10/08/15 at 1433 and received telephone approval for admission today.  Admission Coordinator: Sean Wilkerson, time 0109 /Date 10/08/15          Cosigned by: Ankit Lorie Phenix, MD at 10/08/2015 2:39 PM  Revision History

## 2015-10-08 NOTE — Progress Notes (Signed)
STROKE TEAM PROGRESS NOTE   HISTORY Sean Wilkerson is an 69 y.o. male with a history of hypertension, obesity and syncopal spells, brought to the ED and code stroke status following acute onset of slurred speech along with weakness and numbness involving his right side. Patient was last known well at 7:30 PM tonight. He has no history of stroke nor TIA. He's been taking aspirin 81 mg per day. CT scan of his head showed no acute intracranial abnormality. NIH stroke score was 7. Patient was deemed a candidate for intravenous thrombolytic therapy with TPA, which was administered. Patient was subsequently admitted to neuro intensive care unit for further management. Patient has been experiencing a couple spells of unclear etiology and is currently wearing a long-term cardiac monitor. IV TPA  90 mg Sunday 10/05/2015 at 2045  LSN: 7:30 PM on 10/05/2015 tPA Given: Yes mRankin:   SUBJECTIVE (INTERVAL HISTORY) His  wife is   at the bedside.  Overall he feels his condition is unchanged.  . Blood pressure has been stable. Patient neurologically stable to be transferred to inpatient rehabilitation if bed available  OBJECTIVE Temp:  [97.7 F (36.5 C)-98.7 F (37.1 C)] 98.7 F (37.1 C) (12/21 1311) Pulse Rate:  [65-75] 72 (12/21 1311) Cardiac Rhythm:  [-]  Resp:  [18-19] 18 (12/21 1311) BP: (116-199)/(60-85) 199/84 mmHg (12/21 1352) SpO2:  [92 %-100 %] 97 % (12/21 1311)  CBC:   Recent Labs Lab 10/05/15 2029 10/05/15 2035  WBC 4.6  --   NEUTROABS 1.9  --   HGB 13.2 13.9  HCT 39.0 41.0  MCV 97.3  --   PLT 255  --     Basic Metabolic Panel:   Recent Labs Lab 10/05/15 2029 10/05/15 2035  NA 138 141  K 4.0 4.0  CL 107 105  CO2 24  --   GLUCOSE 139* 136*  BUN 11 14  CREATININE 0.94 0.90  CALCIUM 8.9  --     Lipid Panel:     Component Value Date/Time   CHOL 205* 10/06/2015 0228   TRIG 131 10/06/2015 0228   HDL 44 10/06/2015 0228   CHOLHDL 4.7 10/06/2015 0228   VLDL 26  10/06/2015 0228   LDLCALC 135* 10/06/2015 0228   HgbA1c:  Lab Results  Component Value Date   HGBA1C 6.0* 10/06/2015   Urine Drug Screen: No results found for: LABOPIA, COCAINSCRNUR, LABBENZ, AMPHETMU, THCU, LABBARB    IMAGING  Ct Head Wo Contrast 10/06/2015   No acute intracranial hemorrhage. Mild age-related atrophy and chronic microvascular ischemic disease.    Ct Head Wo Contrast 10/05/2015   No acute intracranial hemorrhage. Mild chronic microvascular ischemic disease. If symptoms persist and there are no contraindications, MRI may provide better evaluation if clinically indicated.    MRI / MRA -  1. Acute 3 cm oval lacunar infarct extending from the left corona radiata through the left external capsule and posterior left lentiform nuclei. No associated hemorrhage or mass effect. 2. Intracranial atherosclerosis with no emergent large vessel occlusion or proximal stenosis. Mild stenosis at the left MCA bifurcation with otherwise normal visualized left MCA branches. Mild to moderate right PCA P2 segment stenosis with preserved distal flow. 3. 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   PHYSICAL EXAM  Middle-aged obese African-American male not in distress. . Afebrile. Head is nontraumatic. Neck is supple without bruit.    Cardiac exam no murmur or gallop. Lungs are clear to  auscultation. Distal pulses are well felt.  Neurological Exam : Awake alert oriented to time place and person. Mild dysarthria. Moderate right lower facial weakness. Extraocular movements full range except slight restriction of right lateral gaze. Blinks to threat bilaterally. Fundi were not visualized. Vision acuity seems adequate. Tongue deviates to the right. Motor system exam reveals dense right hemiplegia with 0/5 strength. Normal strength in the left. Diminished on the right. Sensation and diminished in the right hemibody distribution. Deep  tendon reflexes absent on the right hand normal on the left. Right plantar upgoing left downgoing. Gait cannot be tested.  NIHSS 13   ASSESSMENT/PLAN [name] is a 69 y.o. male with history of hypertension, obesity and syncopal spells, presenting with slurred speech along with weakness and numbness involving his right side. 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 No cardiac source of emboli identified.  LDL 135  HgbA1c 6.0  VTE prophylaxis - SCDs Diet regular Room service appropriate?: Yes; Fluid consistency:: Thin  aspirin 81 mg daily prior to admission, now on No antithrombotic secondary to TPA  Patient counseled to be compliant with his antithrombotic medications  Ongoing aggressive stroke risk factor management  Therapy recommendations:  CLR Disposition:  Rehab Hypertension  Stable Permissive hypertension (OK if < 220/120) but gradually normalize in 5-7 days  Hyperlipidemia  Home meds: No lipid lowering medications prior to admission  LDL 135, goal < 70  Add Lipitor 40 mg daily  Continue statin at discharge    Other Stroke Risk Factors  Advanced age  Obesity, Body mass index is 37.59 kg/(m^2).    Other Active Problems    Hospital day # 3    10/07/2015, 1:47 PM I have personally examined this patient, reviewed notes, independently viewed imaging studies, participated in medical decision making and plan of care. I have made any additions or clarifications directly to the above note.  Plan mobilize out of bed with therapy. Transfer to   Rehabilitation  . Continue aspirin.  Discussed with patient and wife and answered questions Delia Heady, MD Medical  Director [facility] Pager: [phone] 10/08/2015 3:38 PM     To contact Stroke Continuity provider, please refer to [email or web address]. After hours, contact General Neurology

## 2015-10-08 NOTE — Progress Notes (Signed)
Ranelle Oyster, MD Physician Signed Physical Medicine and Rehabilitation Consult Note 10/07/2015 12:20 PM  Related encounter: ED to Hosp-Admission (Current) from 10/05/2015 in [facility] 35[facility] NEURO MEDICAL    Expand All Collapse All        Physical Medicine and Rehabilitation Consult   Reason for Consult: Right sided weakness and difficulty speaking.  Referring Physician: Dr. Pearlean Brownie   HPI: Sean Wilkerson is a 69 y.o. left handed male with history of HTN, recent syncopal episode (being evaluated with cardiac monitor) who was admitted on 10/05/15 with right sided weakness and slurred speech. CT head negative and he was treated with tPA for NIHSS 7. 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 2D echo with EF 55-60% with no wall abnormality. Carotid dopplers without ICA stenosis. Swallow evaluation done yesterday and patient started on regular diet, thin liquids with aspiration precautions for mild pharyngeal dysphagia. Patient with resultant right hemiparesis, moderate to severe dysarthria and dysphagia. PT/OT evaluations pending and CIR recommended in anticipation of extensive rehab needs.   Review of Systems  HENT: Negative for congestion and hearing loss.  Eyes: Negative for blurred vision and double vision.  Respiratory: Negative for cough, shortness of breath and wheezing.  Cardiovascular: Negative for chest pain and palpitations.  Gastrointestinal: Negative for heartburn, nausea, abdominal pain and constipation.  Genitourinary: Negative for dysuria, urgency and frequency.  Musculoskeletal: Negative for myalgias, back pain and neck pain.  Neurological: Positive for sensory change, speech change and focal weakness. Negative for dizziness, tingling and  headaches.  Psychiatric/Behavioral: The patient is not nervous/anxious and does not have insomnia.      Past Medical History  Diagnosis Date  . Hypertension     Past Surgical History  Procedure Laterality Date  . No past surgeries      Family History  Problem Relation Age of Onset  . Cancer Maternal Uncle       Social History: Married. Works fulltime as a Education officer, environmental at [employer]. Independent without AD. Wife works part time as a [employer]. He reports that he has never smoked. He does not have any smokeless tobacco history on file. He reports that he does not drink alcohol or use illicit drugs.    Allergies: No Known Allergies    Medications Prior to Admission  Medication Sig Dispense Refill  . amLODipine (NORVASC) 5 MG tablet Take 5 mg by mouth daily.  1  . aspirin EC 81 MG tablet Take 81 mg by mouth daily.    . carvedilol (COREG) 6.25 MG tablet Take 6.25 mg by mouth 2 (two) times daily.  1    Home: Home Living Family/patient expects to be discharged to:: Private residence Living Arrangements: Spouse/significant other  Functional History:   Functional Status:  Mobility:          ADL:    Cognition: Cognition Overall Cognitive Status: Within Functional Limits for tasks assessed Arousal/Alertness: Awake/alert Orientation Level: Oriented X4 Attention: Sustained Sustained Attention: Appears intact Memory: Appears intact Awareness: Appears intact Problem Solving: Appears intact Cognition Overall Cognitive Status: Within Functional Limits for tasks assessed    Blood pressure 146/77, pulse 67, temperature 98.8 F (37.1 C), temperature source Oral, resp. rate 19, height 5\' 9"  (1.753 m), weight 115.5 kg (254 lb 10.1 oz), SpO2 100 %. Physical Exam  Nursing note and vitals reviewed. Constitutional: He is oriented to person, place, and time. He appears well-developed and well-nourished. He is cooperative.    HENT:  Head: Normocephalic and atraumatic.  Eyes: Conjunctivae are normal. Pupils are equal, round, and reactive to light.  Neck: Normal range of motion. Neck supple.  Cardiovascular: Normal rate and regular rhythm.  Respiratory: Effort normal and breath sounds normal. No stridor. No respiratory distress. He exhibits no tenderness.  GI: Soft. Bowel sounds are normal. He exhibits no distension. There is no tenderness.  Musculoskeletal: He exhibits no edema or tenderness.  Neurological: He is alert and oriented to person, place, and time.  Mild right facial weakness with moderate dysarthria. Able to follow one and two step commands without difficulty. Right hemiparesis with sensory deficits. Has good awareness and insight into deficits. Strength 0/5 RUE and RLE. Right central 7 and tongue deviation.  Skin: Skin is warm and dry.  Psychiatric: He has a normal mood and affect. His behavior is normal. Judgment and thought content normal. His speech is slurred. His speech is not tangential. Cognition and memory are normal. He is communicative.        Lab Results Last 24 Hours    No results found for this or any previous visit (from the past 24 hour(s)).   Ct Head Wo Contrast  10/06/2015 CLINICAL DATA: 69 year old male with right-sided weakness status post tPA. EXAM: CT HEAD WITHOUT CONTRAST TECHNIQUE: Contiguous axial images were obtained from the base of the skull through the vertex without intravenous contrast. COMPARISON: Earlier CT dated 10/05/2015 FINDINGS: There is mild prominence of the ventricles and sulci compatible with age-related volume loss. Mild periventricular and deep white matter hypodensities represent chronic microvascular ischemic changes. There is no intracranial hemorrhage. No mass effect or midline shift identified. There is slight apparent hyperattenuating appearance of the MCAs bilaterally, likely related to hemoconcentration. The visualized paranasal sinuses and  mastoid air cells are well aerated. The calvarium is intact. Diffuse subcutaneous stranding noted over the occiput. IMPRESSION: No acute intracranial hemorrhage. Mild age-related atrophy and chronic microvascular ischemic disease. Electronically Signed By: Elgie CollardArash Radparvar M.D. On: 10/06/2015 02:23    Ct Head Wo Contrast  10/05/2015 CLINICAL DATA: 69 year old male with right-sided weakness. Code stroke. EXAM: CT HEAD WITHOUT CONTRAST TECHNIQUE: Contiguous axial images were obtained from the base of the skull through the vertex without intravenous contrast. COMPARISON: None. FINDINGS: The ventricles and sulci are appropriate in size for patient's age. Mild periventricular and deep white matter hypodensities represent chronic microvascular ischemic changes. There is no intracranial hemorrhage. No mass effect or midline shift identified. The visualized paranasal sinuses and mastoid air cells are well aerated. The calvarium is intact. IMPRESSION: No acute intracranial hemorrhage. Mild chronic microvascular ischemic disease. If symptoms persist and there are no contraindications, MRI may provide better evaluation if clinically indicated. These results were called by telephone at the time of interpretation on 10/05/2015 at 8:38 pm to Dr. Roseanne RenoStewart who verbally acknowledged these results. Electronically Signed By: Elgie CollardArash Radparvar M.D. On: 10/05/2015 20:39   Mr Brain Wo Contrast  10/06/2015 CLINICAL DATA: 69 year old male with new onset slurred speech and right side weakness, code stroke status post IV tPA. Initial encounter. EXAM: MRI HEAD WITHOUT CONTRAST MRA HEAD WITHOUT CONTRAST TECHNIQUE: Multiplanar, multiecho pulse sequences of the brain and surrounding structures were obtained without intravenous contrast. Angiographic images of the head were obtained using MRA technique without contrast. COMPARISON: Head CT without contrast 0134 hours today, and 10/05/2015. FINDINGS: MRI HEAD FINDINGS  3 cm  oval lacunar infarct with restricted diffusion tracking from the left corona radiata through the posterior limb of the external capsule and posterior left lentiform nuclei. Associated T2 and FLAIR hyperintensity. No associated hemorrhage or mass effect. No other restricted diffusion identified. Major intracranial vascular flow voids are within normal limits. Small chronic lacunar infarct in the posterior left cerebellum. Scattered and patchy cerebral white matter T2 and FLAIR hyperintensity outside of the acute findings. No cerebral cortical encephalomalacia. Possible chronic microhemorrhage versus vessel artifact in the left sensory strip on series 9, image 21. No midline shift, mass effect, evidence of mass lesion, ventriculomegaly, extra-axial collection or acute intracranial hemorrhage. Cervicomedullary junction and pituitary are within normal limits. Negative visualized cervical spine. Normal bone marrow signal. Visible internal auditory structures appear normal. 8 mastoids are clear. Mild paranasal sinus mucosal thickening. Leftward gaze deviation, otherwise negative orbit soft tissues. No acute scalp soft tissue findings, left posterior scalp soft tissue scarring. MRA HEAD FINDINGS Codominant distal vertebral arteries with antegrade flow in the posterior circulation. Normal PICA origins. Normal vertebrobasilar junction. No basilar stenosis. Normal SCA and PCA origins. Posterior communicating arteries are diminutive or absent. Mild to moderate stenosis in the right PCA P2 segment. Otherwise bilateral PCA branches are within normal limits. Antegrade flow in both ICA siphons. No siphon stenosis. Patent carotid termini. Normal ophthalmic artery origins. Normal MCA and ACA origins. Diminutive or absent anterior communicating artery. Visualized ACA branches are within normal limits. Mild irregularity and stenosis of both the distal right M1 segment and at the left MCA bifurcation, but otherwise visualized bilateral  MCA branches are within normal limits. IMPRESSION: 1. Acute 3 cm oval lacunar infarct extending from the left corona radiata through the left external capsule and posterior left lentiform nuclei. No associated hemorrhage or mass effect. 2. Intracranial atherosclerosis with no emergent large vessel occlusion or proximal stenosis. Mild stenosis at the left MCA bifurcation with otherwise normal visualized left MCA branches. Mild to moderate right PCA P2 segment stenosis with preserved distal flow. 3. 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 Electronically Signed By: Odessa Fleming M.D. On: 10/06/2015 18:48      Assessment/Plan: Diagnosis: left corona radiata/external capsule/left lentiform nuclei 1. Does the need for close, 24 hr/day medical supervision in concert with the patient's rehab needs make it unreasonable for this patient to be served in a less intensive setting? Yes 2. Co-Morbidities requiring supervision/potential complications: htn,  3. Due to bladder management, bowel management, safety, skin/wound care, disease management, medication administration, pain management and patient education, does the patient require 24 hr/day rehab nursing? Yes 4. Does the patient require coordinated care of a physician, rehab nurse, PT (1-2 hrs/day, 5 days/week), OT (1-2 hrs/day, 5 days/week) and SLP (1-2 hrs/day, 5 days/week) to address physical and functional deficits in the context of the above medical diagnosis(es)? Yes Addressing deficits in the following areas: balance, endurance, locomotion, strength, transferring, bowel/bladder control, bathing, dressing, feeding, grooming, toileting, speech and psychosocial support 5. Can the patient actively participate in an intensive therapy program of at least 3 hrs of therapy per day at least 5 days per week? Yes 6. The potential for patient to make measurable gains while on inpatient rehab is  excellent 7. Anticipated functional outcomes upon discharge from inpatient rehab are supervision and min assist with PT, supervision and min assist with OT, modified independent with SLP. 8. Estimated rehab length of stay to reach the above functional goals is:  20-25 days 9. Does the patient have adequate social supports and living environment to accommodate these discharge functional goals? Yes 10. Anticipated D/C setting: Home 11. Anticipated post D/C treatments: HH therapy and Outpatient therapy 12. Overall Rehab/Functional Prognosis: excellent  RECOMMENDATIONS: This patient's condition is appropriate for continued rehabilitative care in the following setting: CIR Patient has agreed to participate in recommended program. Yes Note that insurance prior authorization may be required for reimbursement for recommended care.  Comment: Rehab Admissions Coordinator to follow up.  Thanks,  Ranelle Oyster, MD, [facility]     10/07/2015       Revision History     Date/Time User Provider Type Action   10/07/2015 2:28 PM Ranelle Oyster, MD Physician Sign   10/07/2015 1:03 PM Jacquelynn Cree, PA-C Physician Assistant [place]   View Details Report       Routing History     Date/Time From To Method   10/07/2015 2:28 PM Ranelle Oyster, MD Ranelle Oyster, MD In Basket

## 2015-10-08 NOTE — Progress Notes (Addendum)
I have received insurance approval for admission to IP Rehab today.  I await clearance from Dr. Pearlean BrownieSethi.    Weldon PickingSusan Nadeen Shipman PT Inpatient Rehab Admissions Coordinator Cell [phone] Office [phone]    1224 ADDENDUM:  I spoke with Dr. Pearlean BrownieSethi and he is in agreement that pt is ready for IP Rehab.  Family and pt. are delighted.  I have updated Elmer Balesourtney Robarge, RNCM and Orie FishermanBashera Nixon, CSW of the plan.  I will make all necessary arrangements for admission later today.  Please call if questions.  Weldon PickingSusan Avah Bashor

## 2015-10-08 NOTE — Care Management Important Message (Signed)
Important Message  Patient Details  Name: Adolphus BirchwoodLarry Lee Lagerstrom MRN: [id number] Date of Birth: 1946-03-14   Medicare Important Message Given:       Orson AloeMegan P Meriel Kelliher 10/08/2015, 3:30 PM

## 2015-10-08 NOTE — Discharge Summary (Signed)
Physician Discharge Summary  Patient ID: Sean Wilkerson MRN: [id number] DOB/AGE: Jul 19, 1946 69 y.o.  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 male with a history of hypertension, obesity and syncopal spells, brought to the ED and code stroke status following acute onset of slurred speech along with weakness and numbness involving his right side. Patient was last known well at 7:30 PM tonight. He has no history of stroke nor TIA. He's been taking aspirin 81 mg per day. CT scan of his head showed no acute intracranial abnormality. NIH stroke score was 7. Patient was deemed a candidate for intravenous thrombolytic therapy with TPA, which was administered. Patient was subsequently admitted to neuro intensive care unit for further management. Patient has been experiencing a couple spells of unclear etiology and is currently wearing a long-term cardiac monitor. IV TPA 90 mg Sunday 10/05/2015 at 2045 LSN: 7:30 PM on 10/05/2015 tPA Given: Yes Patient presented with significant left hemiplegia but after administration of IV TPA made significant improvement. Unfortunately overnight he developed significant worsening of his hemiplegia with increased NIH stroke scale 213. Stat repeat CT scan of the head did not reveal any post TPA hemorrhage or acute worsening. Subsequent MRI scan of the brain was obtained which confirmed a large left corona radiata infarct extending into the left cerebral peduncle. MRA of the brain showed mild intracranial atherosclerotic changes without large vessel occlusion.  Transthoracic echo showed normal ejection fraction. LDL cholesterol was elevated at 135. Hemoglobin A1c was 6.0. Patient wasn't aspirin 81 mg daily which was increased to 325 mg daily. He was started on Lipitor 40 mg as well as elevated lipids. He was seen by physical occupational therapy and felt to be good candidate for inpatient rehabilitation. Rehabilitation services were consulted and felt he was good candidate and was transferred to inpatient rehabilitation on 10/08/15 in a stable condition. Consults: rehabilitation medicine  Significant Diagnostic Studies:  } Ct Head Wo Contrast 10/06/2015  No acute intracranial hemorrhage. Mild age-related atrophy and chronic microvascular ischemic disease.    Ct Head Wo Contrast 10/05/2015  No acute intracranial hemorrhage. Mild chronic microvascular ischemic disease. If symptoms persist and there are no contraindications, MRI may provide better evaluation if clinically indicated.    MRI / MRA - 1. Acute 3 cm oval lacunar infarct extending from the left corona radiata through the left external capsule and posterior left lentiform nuclei. No associated hemorrhage or mass effect. 2. Intracranial atherosclerosis with no emergent large vessel occlusion or proximal stenosis. Mild stenosis at the left MCA bifurcation with otherwise normal visualized left MCA branches. Mild to moderate right PCA P2 segment stenosis with preserved distal flow. 3. Mild chronic small vessel disease in the left cerebellum and elsewhere in the cerebral white matter  Carotid Doppler no significant extracranial stenosis  2D Echo EF 55-60%. No cardiac source of emboli identified.  LDL 135  HgbA1c 6.0 Treatments: iv TPA on 10/05/15  Discharge Exam: Blood pressure 199/84, pulse 72, temperature 98.7 F (37.1 C), temperature source Oral, resp. rate 18, height 5\' 9"  (1.753 m), weight 254 lb 10.1 oz (115.5 kg), SpO2 97 %. PHYSICAL EXAM  Middle-aged obese  African-American male not in distress. . Afebrile. Head is nontraumatic. Neck is supple without bruit. Cardiac exam no murmur or gallop. Lungs are clear to auscultation. Distal pulses are well felt.  Neurological Exam : Awake alert oriented to time place and person. Mild dysarthria. Moderate right lower facial weakness. Extraocular movements full range except slight restriction of right lateral gaze. Blinks to threat bilaterally. Fundi were not visualized. Vision acuity seems adequate. Tongue deviates to the right. Motor system exam reveals dense right hemiplegia with 0/5 strength. Normal strength in the left. Diminished on the right. Sensation and diminished in the right hemibody distribution. Deep tendon reflexes absent on the right hand normal on the left. Right plantar upgoing left downgoing. Gait cannot be tested.  NIHSS 13  Disposition: Inpatient Rehab  Discharge Instructions    Ambulatory referral to Neurology    Complete by:  As directed   Stroke office f/u in 1 month     Diet - low sodium heart healthy    Complete by:  As directed      Increase activity slowly    Complete by:  As directed             Medication List    TAKE these medications        amLODipine 5 MG tablet  Commonly known as:  NORVASC  Take 5 mg by mouth daily.     amLODipine 5 MG tablet  Commonly known as:  NORVASC  Take 1 tablet (5 mg total) by mouth daily.            aspirin 325 MG tablet  Take 1 tablet (325 mg total) by mouth daily.     atorvastatin 40 MG tablet  Commonly known as:  LIPITOR  Take 1 tablet (40 mg total) by mouth daily at 6 PM.     carvedilol 6.25 MG tablet  Commonly known as:  COREG  Take 6.25 mg by mouth 2 (two) times daily.     carvedilol 6.25 MG tablet  Commonly known as:  COREG  Take 1 tablet (6.25 mg total) by mouth 2 (two) times daily with a meal.     pantoprazole 40 MG tablet  Commonly known as:  PROTONIX  Take 1 tablet (40 mg total) by mouth at bedtime.            Follow-up Information    Call in 1 month to follow up.   Why:  call earlier if symptoms worsen or new neurologica, stroke clinic     Time spent doing discharge summary 35 minutes Signed: SETHI,PRAMOD 10/08/2015, 3:50 PM

## 2015-10-08 NOTE — H&P (Signed)
Physical Medicine and Rehabilitation Admission H&P    Chief Complaint  Patient presents with  . Right sided weakness   HPI:    Sean Wilkerson is a 69 y.o. left handed male with history of HTN, recent syncopal episode (being evaluated with cardiac monitor) who was admitted on 10/05/15 with right sided weakness and slurred speech. CT head negative and he was treated with tPA for NIHSS 7. 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 2D echo with EF 55-60% with no wall abnormality. Carotid dopplers without ICA stenosis.Dr. Pearlean Brownie felt that patient had left brain subcortical stroke due to small vessel disease and to continue ASA for secondary stroke prevention.  Swallow evaluation done yesterday and patient started on regular diet, thin liquids with aspiration precautions for mild pharyngeal dysphagia. Patient with resultant right hemiparesis, moderate to severe dysarthria and dysphagia. Therapy evaluations done yesterday and CIR recommended for follow up therapy.   Review of Systems  HENT: Negative for hearing loss.   Eyes: Negative for blurred vision and double vision.  Respiratory: Negative for cough and shortness of breath.   Cardiovascular: Negative for chest pain and palpitations.  Gastrointestinal: Negative for heartburn, nausea, abdominal pain and constipation.  Genitourinary: Positive for frequency. Negative for dysuria and urgency.  Musculoskeletal: Negative for myalgias and back pain.  Skin: Negative for itching.  Neurological: Positive for dizziness (with activity), sensory change, speech change, focal weakness and weakness. Negative for headaches.  Psychiatric/Behavioral: The patient is not nervous/anxious and does not have insomnia.   All other systems reviewed and are  negative.    Past Medical History  Diagnosis Date  . Hypertension     Past Surgical History  Procedure Laterality Date  . No past surgeries      History reviewed. No pertinent family history.    Social History:  Married. Works fulltime as a Education officer, environmental at [employer]. Independent without AD. Wife works part time as a [employer]. He reports that he has never smoked. He does not have any smokeless tobacco history on file. He reports that he does not drink alcohol or use illicit drugs.    Allergies: No Known Allergies    Medications Prior to Admission  Medication Sig Dispense Refill  . amLODipine (NORVASC) 5 MG tablet Take 5 mg by mouth daily.  1  . aspirin EC 81 MG tablet Take 81 mg by mouth daily.    . carvedilol (COREG) 6.25 MG tablet Take 6.25 mg by mouth 2 (two) times daily.  1    Home: Home Living Family/patient expects to be discharged to:: Private residence Living Arrangements: Spouse/significant other Available Help at Discharge: Family, Available 24 hours/day, Other (Comment) Type of Home: House Home Access: Stairs to enter [employer] of Steps: 4 Entrance Stairs-Rails: None Home Layout: Two level, Able to live on main level with bedroom/bathroom Bathroom Shower/Tub: Psychologist, counselling, Door [employer]ocker Toilet: Standard Bathroom Accessibility: Yes   Functional History: Prior Function Level of Independence: Independent Comments: Works as a Education officer, environmental.  Functional Status:  Mobility: Bed Mobility Overal bed mobility: Needs Assistance, +2 for physical assistance Bed Mobility: Rolling, Sidelying to Sit Rolling: Mod assist Sidelying to sit: Mod assist Supine to sit: Mod assist, +2 for physical assistance, HOB elevated Sit to supine: Mod assist, +2 for physical assistance General bed mobility  comments: assist for R UE and rolling hip, then assist for R leg off bed and to lift trunk Transfers Overall transfer level: Needs assistance Equipment used: 2 person hand  held assist Transfers: Squat Pivot Transfers Sit to Stand: Mod assist, +2 physical assistance Stand pivot transfers: Max assist, +2 physical assistance General transfer comment: pivot to chair on R side assist for scooting to EOB and placing R LE      ADL: ADL Overall ADL's : Needs assistance/impaired Toilet Transfer: Maximal assistance, +2 for physical assistance, Cueing for sequencing, Stand-pivot, BSC Toileting- Clothing Manipulation and Hygiene: Maximal assistance, +2 for physical assistance, Cueing for sequencing, Sit to/from stand Functional mobility during ADLs: Maximal assistance, +2 for physical assistance, Cueing for sequencing General ADL Comments: Max +2 assist for transfers and pericare. Verbal cues for awareness and to protect RUE at all times. Showed pt and wife how to support RUE in sitting and laying positions to avoid shoulder subluxation and increased edema.  Cognition: Cognition Overall Cognitive Status: Within Functional Limits for tasks assessed Arousal/Alertness: Awake/alert Orientation Level: Oriented X4 Attention: Sustained Sustained Attention: Appears intact Memory: Appears intact Awareness: Appears intact Problem Solving: Appears intact Cognition Arousal/Alertness: Awake/alert Behavior During Therapy: Flat affect Overall Cognitive Status: Within Functional Limits for tasks assessed   Blood pressure 181/66, pulse 75, temperature 98.6 F (37 C), temperature source Oral, resp. rate 18, height  (1.753 m), weight 115.5 kg (254 lb 10.1 oz), SpO2 92 %. Physical Exam  Nursing note and vitals reviewed. Constitutional: He is oriented to person, place, and time. He appears well-developed and well-nourished.  HENT:  Head: Normocephalic and atraumatic.  Mouth/Throat: Oropharynx is clear and moist.  Wears full set dentures.   Eyes: Conjunctivae and EOM are normal. Pupils are equal, round, and reactive to light. Right eye exhibits no discharge. Left eye  exhibits no discharge.  Neck: Normal range of motion. Neck supple.  Cardiovascular: Normal rate and regular rhythm.   No murmur heard. Respiratory: Effort normal and breath sounds normal. No respiratory distress. He has no wheezes.  GI: Soft. Bowel sounds are normal. He exhibits no distension. There is no tenderness.  Musculoskeletal: He exhibits edema (RUE). He exhibits no tenderness.  Neurological: He is alert and oriented to person, place, and time. He has normal reflexes.  Right hemifacial weakness and tongue deviation to right. Dysphonic with moderate dysarthria.  Has good awareness and insight into deficits.  LUE/LLE: 5/5 proximal to distal RUE/RLE: 0/5 proximal to distal  Skin: Skin is warm and dry.  Psychiatric: He has a normal mood and affect. His behavior is normal. Judgment and thought content normal.    Results for orders placed or performed during the hospital encounter of 10/05/15 (from the past 48 hour(s))  Glucose, capillary     Status: Abnormal   Collection Time: 10/07/15  4:24 PM  Result Value Ref Range   Glucose-Capillary 131 (H) 65 - 99 mg/dL  Glucose, capillary     Status: Abnormal   Collection Time: 10/07/15 10:26 PM  Result Value Ref Range   Glucose-Capillary 124 (H) 65 - 99 mg/dL   Mr Brain Wo Contrast  10/06/2015  CLINICAL DATA:  69 year old male with new onset slurred speech and right side weakness, code stroke status post IV tPA. Initial encounter. EXAM: MRI HEAD WITHOUT CONTRAST MRA HEAD WITHOUT CONTRAST TECHNIQUE: Multiplanar, multiecho pulse sequences of the brain and surrounding structures were obtained without intravenous contrast. Angiographic images of the head were obtained using MRA technique without contrast.  COMPARISON:  Head CT without contrast 0134 hours today, and 10/05/2015. FINDINGS: MRI HEAD FINDINGS 3 cm oval lacunar infarct with restricted diffusion tracking from the left corona radiata through the posterior limb of the external capsule and  posterior left lentiform nuclei. Associated T2 and FLAIR hyperintensity. No associated hemorrhage or mass effect. No other restricted diffusion identified. Major intracranial vascular flow voids are within normal limits. Small chronic lacunar infarct in the posterior left cerebellum. Scattered and patchy cerebral white matter T2 and FLAIR hyperintensity outside of the acute findings. No cerebral cortical encephalomalacia. Possible chronic microhemorrhage versus vessel artifact in the left sensory strip on series 9, image 21. No midline shift, mass effect, evidence of mass lesion, ventriculomegaly, extra-axial collection or acute intracranial hemorrhage. Cervicomedullary junction and pituitary are within normal limits. Negative visualized cervical spine. Normal bone marrow signal. Visible internal auditory structures appear normal. 8 mastoids are clear. Mild paranasal sinus mucosal thickening. Leftward gaze deviation, otherwise negative orbit soft tissues. No acute scalp soft tissue findings, left posterior scalp soft tissue scarring. MRA HEAD FINDINGS Codominant distal vertebral arteries with antegrade flow in the posterior circulation. Normal PICA origins. Normal vertebrobasilar junction. No basilar stenosis. Normal SCA and PCA origins. Posterior communicating arteries are diminutive or absent. Mild to moderate stenosis in the right PCA P2 segment. Otherwise bilateral PCA branches are within normal limits. Antegrade flow in both ICA siphons. No siphon stenosis. Patent carotid termini. Normal ophthalmic artery origins. Normal MCA and ACA origins. Diminutive or absent anterior communicating artery. Visualized ACA branches are within normal limits. Mild irregularity and stenosis of both the distal right M1 segment and at the left MCA bifurcation, but otherwise visualized bilateral MCA branches are within normal limits. IMPRESSION: 1. Acute 3 cm oval lacunar infarct extending from the left corona radiata through the  left external capsule and posterior left lentiform nuclei. No associated hemorrhage or mass effect. 2. Intracranial atherosclerosis with no emergent large vessel occlusion or proximal stenosis. Mild stenosis at the left MCA bifurcation with otherwise normal visualized left MCA branches. Mild to moderate right PCA P2 segment stenosis with preserved distal flow. 3. 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 Electronically Signed   By: Odessa FlemingH  Hall M.D.   On: 10/06/2015 18:48   Mr Maxine GlennMra Head/brain Wo Cm  10/06/2015  CLINICAL DATA:  69 year old male with new onset slurred speech and right side weakness, code stroke status post IV tPA. Initial encounter. EXAM: MRI HEAD WITHOUT CONTRAST MRA HEAD WITHOUT CONTRAST TECHNIQUE: Multiplanar, multiecho pulse sequences of the brain and surrounding structures were obtained without intravenous contrast. Angiographic images of the head were obtained using MRA technique without contrast. COMPARISON:  Head CT without contrast 0134 hours today, and 10/05/2015. FINDINGS: MRI HEAD FINDINGS 3 cm oval lacunar infarct with restricted diffusion tracking from the left corona radiata through the posterior limb of the external capsule and posterior left lentiform nuclei. Associated T2 and FLAIR hyperintensity. No associated hemorrhage or mass effect. No other restricted diffusion identified. Major intracranial vascular flow voids are within normal limits. Small chronic lacunar infarct in the posterior left cerebellum. Scattered and patchy cerebral white matter T2 and FLAIR hyperintensity outside of the acute findings. No cerebral cortical encephalomalacia. Possible chronic microhemorrhage versus vessel artifact in the left sensory strip on series 9, image 21. No midline shift, mass effect, evidence of mass lesion, ventriculomegaly, extra-axial collection or acute intracranial hemorrhage. Cervicomedullary junction and pituitary are within  normal limits. Negative visualized  cervical spine. Normal bone marrow signal. Visible internal auditory structures appear normal. 8 mastoids are clear. Mild paranasal sinus mucosal thickening. Leftward gaze deviation, otherwise negative orbit soft tissues. No acute scalp soft tissue findings, left posterior scalp soft tissue scarring. MRA HEAD FINDINGS Codominant distal vertebral arteries with antegrade flow in the posterior circulation. Normal PICA origins. Normal vertebrobasilar junction. No basilar stenosis. Normal SCA and PCA origins. Posterior communicating arteries are diminutive or absent. Mild to moderate stenosis in the right PCA P2 segment. Otherwise bilateral PCA branches are within normal limits. Antegrade flow in both ICA siphons. No siphon stenosis. Patent carotid termini. Normal ophthalmic artery origins. Normal MCA and ACA origins. Diminutive or absent anterior communicating artery. Visualized ACA branches are within normal limits. Mild irregularity and stenosis of both the distal right M1 segment and at the left MCA bifurcation, but otherwise visualized bilateral MCA branches are within normal limits. IMPRESSION: 1. Acute 3 cm oval lacunar infarct extending from the left corona radiata through the left external capsule and posterior left lentiform nuclei. No associated hemorrhage or mass effect. 2. Intracranial atherosclerosis with no emergent large vessel occlusion or proximal stenosis. Mild stenosis at the left MCA bifurcation with otherwise normal visualized left MCA branches. Mild to moderate right PCA P2 segment stenosis with preserved distal flow. 3. 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 Electronically Signed   By: Odessa Fleming M.D.   On: 10/06/2015 18:48       Medical Problem List and Plan: 1.  Hemiplegia, dysarthria, dysphagia secondary to left corona radiata, external capsule, and posterior left lentiform nuclei infarct 2.   DVT Prophylaxis/Anticoagulation: Mechanical: Sequential compression devices, below knee Bilateral lower extremities 3. Pain Management: Tylenol prn.  4. Mood: LCSW to follow for evaluation and support.  5. Neuropsych: This patient is capable of making decisions on hid own behalf. 6. Skin/Wound Care: Routine pressure relief measures.  7. Fluids/Electrolytes/Nutrition: Monitor I/O. Check lytes in am. Offer supplements between meals if intake poor.  8. HTN: Monitor BP tid with permissive HTN and gradual normalization over next 5 days. Continue norvasc and coreg daily. Will check orthostatic vitals due to reports of dizziness with activity.  9. Dyslipidemia: Cotinue Lipitor daily.   Post Admission Physician Evaluation: 1. Functional deficits secondary to left corona radiata, external capsule, and posterior left lentiform nuclei infarct. 2. Patient is admitted to receive collaborative, interdisciplinary care between the physiatrist, rehab nursing staff, and therapy team. 3. Patient's level of medical complexity and substantial therapy needs in context of that medical necessity cannot be provided at a lesser intensity of care such as a SNF. 4. Patient has experienced substantial functional loss from his/her baseline which was documented above under the "Functional History" and "Functional Status" headings.  Judging by the patient's diagnosis, physical exam, and functional history, the patient has potential for functional progress which will result in measurable gains while on inpatient rehab.  These gains will be of substantial and practical use upon discharge  in facilitating mobility and self-care at the household level. 5. Physiatrist will provide 24 hour management of medical needs as well as oversight of the therapy plan/treatment and provide guidance as appropriate regarding the interaction of the two. 6. 24 hour rehab nursing will assist with bladder management, bowel management, safety, skin/wound  care, disease management, medication administration, pain management and patient education and help integrate therapy concepts, techniques,education, etc. 7. PT will assess and treat for/with: Lower extremity strength, range  of motion, stamina, balance, functional mobility, safety, adaptive techniques and equipment, coping skills, pain control, stroke education.   Goals are: Min A/superivions. 8. OT will assess and treat for/with: ADL's, functional mobility, safety, upper extremity strength, adaptive techniques and equipment, ego support, and community reintegration.   Goals are: Min A/Supervision. Therapy may proceed with showering this patient. 9. SLP will assess and treat for/with: swallowing and speech.  Goals are: Mod I. 10. Case Management and Social Worker will assess and treat for psychological issues and discharge planning. 11. Team conference will be held weekly to assess progress toward goals and to determine barriers to discharge. 12. Patient will receive at least 3 hours of therapy per day at least 5 days per week. 13. ELOS: 20-24 days.       14. Prognosis:  good  Maryla Morrow, MD 10/08/2015

## 2015-10-08 NOTE — Progress Notes (Signed)
Occupational Therapy Evaluation Patient Details Name: Sean Wilkerson MRN: [id number] DOB: 15-Aug-1946 Today's Date: 10/08/2015    History of Present Illness pt presents with large L Subcortical Infarct.  pt with hx of HTN and Syncope.     Clinical Impression    PTA, pt was independent with all ADLs and mobility. Pt currently presents with R side hemiplegia and some expressive aphasia and required max +2 assist for all transfers. Discussed protection strategies for RUE with pt and wife. Feel pt will benefit from CIR at d/c to maximize independence and safety with ADLs and mobility and to decrease caregiver burden prior to returning home. OT will continue to follow acutely.    Follow Up Recommendations  CIR    Equipment Recommendations  Other (comment) (TBD in next venue)    Recommendations for Other Services       Precautions / Restrictions Precautions Precautions: Fall Restrictions Weight Bearing Restrictions: No      Mobility Bed Mobility               General bed mobility comments: Pt up in chair on OT arrival.  Transfers Overall transfer level: Needs assistance Equipment used: 2 person hand held assist Transfers: Sit to/from Stand;Stand Pivot Transfers Sit to Stand: Max assist;+2 physical assistance Stand pivot transfers: Max assist;+2 physical assistance       General transfer comment: Sit-stand x2 and stand-pivot transfer x2. R knee blocking, facilitation for hip extension, total physical assist to move R foot during stand-pivot transfer.      Balance Overall balance assessment: Needs assistance Sitting-balance support: Single extremity supported;Feet supported Sitting balance-Leahy Scale: Fair   Postural control: Right lateral lean Standing balance support: Single extremity supported;During functional activity Standing balance-Leahy Scale: Poor                              ADL Overall ADL's : Needs assistance/impaired                          Toilet Transfer: Maximal assistance;+2 for physical assistance;Cueing for sequencing;Stand-pivot;BSC   Toileting- Clothing Manipulation and Hygiene: Maximal assistance;+2 for physical assistance;Cueing for sequencing;Sit to/from stand       Functional mobility during ADLs: Maximal assistance;+2 for physical assistance;Cueing for sequencing General ADL Comments: Max +2 assist for transfers and pericare. Verbal cues for awareness and to protect RUE at all times. Showed pt and wife how to support RUE in sitting and laying positions to avoid shoulder subluxation and increased edema.     Vision Vision Assessment?: Yes Eye Alignment: Within Functional Limits Ocular Range of Motion: Within Functional Limits Alignment/Gaze Preference: Within Defined Limits Tracking/Visual Pursuits: Decreased smoothness of horizontal tracking;Decreased smoothness of vertical tracking Saccades: Decreased speed of saccadic movement Convergence: Within functional limits Visual Fields: No apparent deficits   Perception     Praxis      Pertinent Vitals/Pain Pain Assessment: No/denies pain     Hand Dominance Left   Extremity/Trunk Assessment Upper Extremity Assessment Upper Extremity Assessment: RUE deficits/detail RUE Deficits / Details: strength near flaccid, diminished sensation but not absent, no shoulder subluxation RUE Sensation: decreased light touch;decreased proprioception RUE Coordination: decreased fine motor;decreased gross motor   Lower Extremity Assessment Lower Extremity Assessment: Defer to PT evaluation   Cervical / Trunk Assessment Cervical / Trunk Assessment: Normal   Communication Communication Communication: Expressive difficulties (Slurred and quiet)   Cognition Arousal/Alertness: Awake/alert Behavior During Therapy:  Flat affect Overall Cognitive Status: Within Functional Limits for tasks assessed                     General Comments        Exercises       Shoulder Instructions      Home Living Family/patient expects to be discharged to:: Private residence Living Arrangements: Spouse/significant other Available Help at Discharge: Family;Available 24 hours/day;Other (Comment) Type of Home: House Home Access: Stairs to enter Enter[employer] CorporationEntrance Stairs-Number of Steps: 4 Entrance Stairs-Rails: None Home Layout: Two level;Able to live on main level with bedroom/bathroom     Bathroom Shower/Tub: Walk-in shower;Door   Foot LockerBathroom Toilet: Standard Bathroom Accessibility: Yes How Accessible: Accessible via walker;Other (comment) (per wife, possibly accessible to W/C)            Prior Functioning/Environment Level of Independence: Independent        Comments: Works as a Education officer, [employer].    OT Diagnosis: Hemiplegia non-dominant side   OT Problem List: Decreased strength;Decreased range of motion;Decreased activity tolerance;Impaired balance (sitting and/or standing);Decreased coordination;Decreased safety awareness;Decreased knowledge of use of DME or AE;Decreased knowledge of precautions;Impaired sensation;Obesity;Impaired tone;Impaired UE functional use   OT Treatment/Interventions: Self-care/ADL training;Therapeutic exercise;Energy conservation;Neuromuscular education;DME and/or AE instruction;Therapeutic activities;Patient/family education;Balance training    OT Goals(Current goals can be found in the care plan section) Acute Rehab OT Goals Patient Stated Goal: to get going with rehab OT Goal Formulation: With patient Time For Goal Achievement: 10/22/15 Potential to Achieve Goals: Good ADL Goals Pt Will Perform Grooming: with min assist;sitting (incorporating RUE to stabilize objects with no verbal cues) Pt Will Perform Upper Body Bathing: with min assist;sitting (incorporating RUE to stabilize objects with no verbal cues) Pt Will Perform Upper Body Dressing: with min assist;sitting (incorporating RUE to stabilize objects with no  verbal cues) Pt Will Transfer to Toilet: with mod assist;stand pivot transfer;bedside commode Additional ADL Goal #1: Pt will independently demonstrate strategies to protect RUE during ADL tasks and mobility.  OT Frequency: Min 3X/week   Barriers to D/C:            Co-evaluation              End of Session Equipment Utilized During Treatment: Gait belt Nurse Communication: Mobility status;Other (comment) (Pillows under RUE at all times)  Activity Tolerance: Patient tolerated treatment well Patient left: in chair;with call bell/phone within reach;with chair alarm set;with family/visitor present   Time: 1010-1034 OT Time Calculation (min): 24 min Charges:  OT General Charges $OT Visit: 1 Procedure OT Evaluation $Initial OT Evaluation Tier I: 1 Procedure OT Treatments $Self Care/Home Management : 8-22 mins G-Codes:    Nils PyleJulia Joseeduardo Brix, OTR/L 10/08/2015, 11:02 AM

## 2015-10-08 NOTE — PMR Pre-admission (Signed)
PMR Admission Coordinator Pre-Admission Assessment  Patient: Sean Wilkerson is an 69 y.o., male MRN: [id number] DOB: 12/18/1945 Height: '5\' 9"'$  (175.3 cm) Weight: 115.5 kg (254 lb 10.1 oz)              Insurance Information HMO:     PPO: yes    PCP:      IPA:      80/20:      OTHER:  PRIMARY: [employer] Medicare      Policy#:  [id number]      Subscriber:  self CM Name: Sean Wilkerson      Phone#:  [phone]     Fax#:  Pre-Cert#: [id number], approved for admission to IP Rehab.  Reviewer Sean Wilkerson to follow up with EMR access ; phone [phone], fax [phone]      Employer:  Self employed Benefits:  Phone #:  [phone]     Name:  Online and Letta Median. Date:  10/18/14     Deduct:  $200       Out of Pocket Max:  $2200      Life Max:  none CIR:  $0 copay once deductible met      SNF:  $0 copay if deductible met  Outpatient:  $0 copay if deductible met     Co-Pay:  Home Health:   $0 copay if deductible met    Co-Pay:  DME:    $0 if deductible met Co-Pay:   Providers: in network SECONDARY:  none      Policy#:      Subscriber:  CM Name:       Phone#:      Fax#:  Pre-Cert#:       Employer:  Benefits:  Phone #:      Name:  Eff. Date:      Deduct:       Out of Pocket Max:       Life Max:  CIR:       SNF:  Outpatient:      Co-Pay:  Home Health:       Co-Pay:  DME:      Co-Pay:   Medicaid Application Date:       Case Manager:  Disability Application Date:       Case Worker:   Emergency Contact Information Contact Information    Name Relation Home Work Mobile   Sean Wilkerson Spouse [phone]       Current Medical History  Patient Admitting Diagnosis:left corona radiata/external capsule/left lentiform nuclei  History of Present Illness: Sean Wilkerson is a 69 y.o. left handed male with history of HTN, recent syncopal episode (being evaluated with cardiac monitor) who was admitted on 10/05/15 with right sided weakness and slurred speech. CT head negative and he was treated with  tPA for NIHSS 7. 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 2D echo with EF 55-60% with no wall abnormality. Carotid dopplers without ICA stenosis.Dr. Leonie Man felt that patient had left brain subcortical stroke due to small vessel disease and to continue ASA for secondary stroke prevention. Swallow evaluation done yesterday and patient started on regular diet, thin liquids with aspiration precautions for mild pharyngeal dysphagia. Patient with resultant right hemiparesis, moderate to severe dysarthria and dysphagia. Therapy evaluations done yesterday and CIR recommended for follow up therapy Total: 14  NIH    Past Medical History  Past Medical History  Diagnosis Date  . Hypertension     Family History  family history is not on file.  Prior Rehab/Hospitalizations:  Has the patient had major surgery during 100 days prior to admission? No  Current Medications   Current facility-administered medications:  .  0.9 %  sodium chloride infusion, , Intravenous, Continuous, Noel Christmas, Last Rate: 75 mL/hr at 10/08/15 8616 .  acetaminophen (TYLENOL) tablet 650 mg, 650 mg, Oral, Q4H PRN **OR** [DISCONTINUED] acetaminophen (TYLENOL) suppository 650 mg, 650 mg, Rectal, Q4H PRN, Noel Christmas .  amLODipine (NORVASC) tablet 5 mg, 5 mg, Oral, Daily, Micki Riley, MD, 5 mg at 10/08/15 0930 .  aspirin tablet 325 mg, 325 mg, Oral, Daily, Ramond Marrow, DO, 325 mg at 10/08/15 0930 .  carvedilol (COREG) tablet 6.25 mg, 6.25 mg, Oral, BID WC, Micki Riley, MD, 6.25 mg at 10/08/15 0748 .  labetalol (NORMODYNE,TRANDATE) injection 10 mg, 10 mg, Intravenous, Q10 min PRN, Noel Christmas, 10 mg at 10/08/15 1355 .  pantoprazole (PROTONIX) EC tablet 40 mg, 40 mg, Oral, QHS, Micki Riley,  MD  Patients Current Diet: Diet regular Room service appropriate?: Yes; Fluid consistency:: Thin  Precautions / Restrictions Precautions Precautions: Fall Restrictions Weight Bearing Restrictions: No   Has the patient had 2 or more falls or a fall with injury in the past year?No  Prior Activity Level Community (5-7x/wk): Per wife, pt. is out of the home every day.  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    Home Assistive Devices / Equipment Home Assistive Devices/Equipment: None  Prior Device Use: Indicate devices/aids used by the patient prior to current illness, exacerbation or injury? None of the above; no device needed PTA  Prior Functional Level Prior Function Level of Independence: Independent Comments: Works as a Education officer, environmental.  Self Care: Did the patient need help bathing, dressing, using the toilet or eating?  Independent  Indoor Mobility: Did the patient need assistance with walking from room to room (with or without device)? Independent  Stairs: Did the patient need assistance with internal or external stairs (with or without device)? Independent  Functional Cognition: Did the patient need help planning regular tasks such as shopping or remembering to take medications? Independent  Current Functional Level Cognition  Arousal/Alertness: Awake/alert Overall Cognitive Status: Within Functional Limits for tasks assessed Orientation Level: Oriented X4 Attention: Sustained Sustained Attention: Appears intact Memory: Appears intact Awareness: Appears intact Problem Solving: Appears intact    Extremity Assessment (includes Sensation/Coordination)  Upper Extremity Assessment: RUE deficits/detail RUE Deficits / Details: strength near flaccid, diminished sensation but not absent, no shoulder subluxation RUE Sensation: decreased light touch, decreased proprioception RUE Coordination: decreased fine motor, decreased gross motor  Lower Extremity Assessment: Defer to PT  evaluation RLE Deficits / Details: Strength near flaccid.  Does demonstrate trace hamstrings and hip hike.  Sensation diminished, but not absent.   RLE Sensation: decreased light touch RLE Coordination: decreased fine motor, decreased gross motor    ADLs  Overall ADL's : Needs assistance/impaired Toilet Transfer: Maximal assistance, +2 for physical assistance, Cueing for sequencing, Stand-pivot, BSC Toileting- Clothing Manipulation and Hygiene: Maximal assistance, +2 for physical assistance, Cueing for sequencing, Sit to/from stand Functional mobility during ADLs: Maximal assistance, +2 for physical assistance, Cueing for sequencing General ADL Comments: Max +2 assist for transfers and pericare. Verbal cues for awareness and to protect RUE at all times. Showed pt and wife how to support  RUE in sitting and laying positions to avoid shoulder subluxation and increased edema.    Mobility  Overal bed mobility: Needs Assistance, +2 for physical assistance Bed Mobility: Rolling, Sidelying to Sit Rolling: Mod assist Sidelying to sit: Mod assist Supine to sit: Mod assist, +2 for physical assistance, HOB elevated Sit to supine: Mod assist, +2 for physical assistance General bed mobility comments: assist for R UE and rolling hip, then assist for R leg off bed and to lift trunk    Transfers  Overall transfer level: Needs assistance Equipment used: 2 person hand held assist Transfers: Squat Pivot Transfers Sit to Stand: Mod assist, +2 physical assistance Stand pivot transfers: Max assist, +2 physical assistance General transfer comment: pivot to chair on R side assist for scooting to EOB and placing R LE    Ambulation / Gait / Stairs / Wheelchair Mobility       Posture / Balance Dynamic Sitting Balance Sitting balance - Comments: cues and frequent assist with head position to prevent R LOB/pushing; worked on trunk rotation, upper trunk extension and cues for midline head position sitting EOB  about 10 minutes Balance Overall balance assessment: Needs assistance Sitting-balance support: Feet supported, Single extremity supported Sitting balance-Leahy Scale: Fair Sitting balance - Comments: cues and frequent assist with head position to prevent R LOB/pushing; worked on trunk rotation, upper trunk extension and cues for midline head position sitting EOB about 10 minutes Postural control: Right lateral lean Standing balance support: Single extremity supported, During functional activity Standing balance-Leahy Scale: Poor Standing balance comment: NT due to pt c/o dizziness in sitting EOB    Special needs/care consideration BiPAP/CPAP  no CPM   no Continuous Drip IV    no Dialysis   no        Life Vest   no Oxygen   no Special Bed   no Trach Size   no Wound Vac (area)   no       Skin   WDL per nursing flowsheet                               Bowel mgmt:   Morning of admission per pt. (10/05/15) Bladder mgmt:  Diabetic mgmt A1c 6.0     Previous Home Environment Living Arrangements: Spouse/significant other Available Help at Discharge: Family, Available 24 hours/day, Other (Comment) Type of Home: House Home Layout: Two level, Able to live on main level with bedroom/bathroom Home Access: Stairs to enter Entrance Stairs-Rails: None Entrance Stairs-Number of Steps: 4 Bathroom Shower/Tub: Gaffer, Charity fundraiser: Standard Bathroom Accessibility: Yes How Accessible: Accessible via walker, Other (comment) (per wife, possibly accessible to W/C) [place]: No  Discharge Living Setting Plans for Discharge Living Setting: Patient's home Type of Home at Discharge: House Discharge Home Layout: Two level, Able to live on main level with bedroom/bathroom Alternate Level Stairs-Rails:  (1 rail) Discharge Home Access: Stairs to enter Entrance Stairs-Rails:  (one rail) Technical brewer of Steps: 4 Discharge Bathroom Shower/Tub: Mining engineer: Standard Discharge Bathroom Accessibility: Yes How Accessible: Accessible via walker (per wife, may be accessible for W/C) Does the patient have any problems obtaining your medications?: No  Social/Family/Support Systems Patient Roles: Spouse, Parent Contact Information: Mourad Cwikla, wife [phone] Anticipated Caregiver: Wife will be primary caregiver and understands that pt. will need 24 hour assist upon DC from rehab.  She states she will begin to work on arranging  this care. Ability/Limitations of Caregiver: wife works part time Careers adviser: Intermittent Discharge Plan Discussed with Primary Caregiver: Yes Is Caregiver In Agreement with Plan?: Yes Does Caregiver/Family have Issues with Lodging/Transportation while Pt is in Rehab?: No   Goals/Additional Needs Patient/Family Goal for Rehab: supervision and min assist for PT/OT, modified independent SLP Expected length of stay: 20-25 days Cultural Considerations: none Dietary Needs: regualr diet, thin liquids, one sip at a time to avoid aspiration Equipment Needs: TBA Pt/Family Agrees to Admission and willing to participate: Yes Program Orientation Provided & Reviewed with Pt/Caregiver Including Roles  & Responsibilities: Yes   Decrease burden of Care through IP rehab admission: no   Possible need for SNF placement upon discharge:   Not anticipated   Patient Condition: This patient's condition remains as documented in the consult dated 10/08/2015 , in which the Rehabilitation Physician determined and documented that the patient's condition is appropriate for intensive rehabilitative care in an inpatient rehabilitation facility. Will admit to inpatient rehab today.  Preadmission Screen Completed By:  Gerlean Ren, 10/08/2015 2:34 PM ______________________________________________________________________   Discussed status with Dr.  Posey Pronto on 10/08/15 at  1433  and received telephone approval  for admission today.  Admission Coordinator:  Gerlean Ren, time 1287 /Date 10/08/15

## 2015-10-08 NOTE — Care Management Note (Signed)
Case Management Note  Patient Details  Name: Sean BirchwoodLarry Lee Wilkerson MRN: [id number] Date of Birth: [birth year]  Subjective/Objective:                    Action/Plan: Plan is to discharge patient to CIR today. No further needs per CM.    Expected Discharge Date:                  Expected Discharge Plan:  IP Rehab Facility  In-House Referral:     Discharge planning Services  CM Consult  Post Acute Care Choice:    Choice offered to:     DME Arranged:    DME Agency:     HH Arranged:    HH Agency:     Status of Service:  In process, will continue to follow  Medicare Important Message Given:    Date Medicare IM Given:    Medicare IM give by:    Date Additional Medicare IM Given:    Additional Medicare Important Message give by:     If discussed at Long Length of Stay Meetings, dates discussed:    Additional Comments:  Kermit BaloKelli F Lorenza Winkleman, RN 10/08/2015, 1:51 PM

## 2015-10-08 NOTE — Progress Notes (Signed)
Patient transferred to rehab.  

## 2015-10-09 ENCOUNTER — Inpatient Hospital Stay (HOSPITAL_COMMUNITY): Payer: Medicare Other | Admitting: Occupational Therapy

## 2015-10-09 ENCOUNTER — Inpatient Hospital Stay (HOSPITAL_COMMUNITY): Payer: Medicare Other | Admitting: Speech Pathology

## 2015-10-09 ENCOUNTER — Inpatient Hospital Stay (HOSPITAL_COMMUNITY): Payer: Medicare Other | Admitting: Physical Therapy

## 2015-10-09 ENCOUNTER — Telehealth: Payer: Self-pay | Admitting: Neurology

## 2015-10-09 LAB — COMPREHENSIVE METABOLIC PANEL
ALT: 29 U/L (ref 17–63)
AST: 25 U/L (ref 15–41)
Albumin: 3.3 g/dL — ABNORMAL LOW (ref 3.5–5.0)
Alkaline Phosphatase: 72 U/L (ref 38–126)
Anion gap: 9 (ref 5–15)
BUN: 9 mg/dL (ref 6–20)
CHLORIDE: 107 mmol/L (ref 101–111)
CO2: 24 mmol/L (ref 22–32)
Calcium: 9.1 mg/dL (ref 8.9–10.3)
Creatinine, Ser: 0.79 mg/dL (ref 0.61–1.24)
GFR calc Af Amer: >60 mL/min (ref >60)
Glucose, Bld: 131 mg/dL — ABNORMAL HIGH (ref 65–99)
POTASSIUM: 4 mmol/L (ref 3.5–5.1)
SODIUM: 140 mmol/L (ref 135–145)
Total Bilirubin: 0.7 mg/dL (ref 0.3–1.2)
Total Protein: 6.5 g/dL (ref 6.5–8.1)

## 2015-10-09 LAB — CBC WITH DIFFERENTIAL/PLATELET
Basophils Absolute: 0 10*3/uL (ref 0.0–0.1)
Basophils Relative: 0 %
EOS ABS: 0.2 10*3/uL (ref 0.0–0.7)
EOS PCT: 3 %
HCT: 39.3 % (ref 39.0–52.0)
Hemoglobin: 12.9 g/dL — ABNORMAL LOW (ref 13.0–17.0)
LYMPHS ABS: 2.3 10*3/uL (ref 0.7–4.0)
LYMPHS PCT: 36 %
MCH: 32.3 pg (ref 26.0–34.0)
MCHC: 32.8 g/dL (ref 30.0–36.0)
MCV: 98.5 fL (ref 78.0–100.0)
MONOS PCT: 7 %
Monocytes Absolute: 0.4 10*3/uL (ref 0.1–1.0)
Neutro Abs: 3.5 10*3/uL (ref 1.7–7.7)
Neutrophils Relative %: 54 %
PLATELETS: 235 10*3/uL (ref 150–400)
RBC: 3.99 MIL/uL — AB (ref 4.22–5.81)
RDW: 12.2 % (ref 11.5–15.5)
WBC: 6.5 10*3/uL (ref 4.0–10.5)

## 2015-10-09 MED ORDER — AMLODIPINE BESYLATE 5 MG PO TABS
7.5000 mg | ORAL_TABLET | Freq: Every day | ORAL | Status: DC
  Administered 2015-10-09 – 2015-10-10 (×2): 7.5 mg via ORAL
  Filled 2015-10-09 (×2): qty 1

## 2015-10-09 NOTE — Evaluation (Signed)
Physical Therapy Assessment and Plan  Patient Details  Name: Sean Wilkerson MRN: [id number] Date of Birth: Mar 14, 1946  PT Diagnosis: Abnormality of gait, Coordination disorder, Difficulty walking, Hemiplegia non-dominant, Impaired sensation and Muscle weakness Rehab Potential: Good ELOS: 21-24 days   Today's Date: 10/09/2015 PT Individual Time: 1330-1430 PT Individual Time Calculation (min): 60 min    Problem List:  Patient Active Problem List   Diagnosis Date Noted  . Stroke, acute, thrombotic (HCC) 10/08/2015  . 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 Essential hypertension 10/08/2015  . HLD (hyperlipidemia) 10/08/2015  . Hemiplegia and hemiparesis following cerebral infarction affecting right dominant side (HCC)   . Right hemiplegia (HCC) 10/06/2015  . Stroke (cerebrum) (HCC) 10/05/2015  . CVA (cerebral infarction) 10/05/2015    Past Medical History:  Past Medical History  Diagnosis Date  . Hypertension   . HTN (hypertension) 10/08/2015  . HLD (hyperlipidemia) 10/08/2015   Past Surgical History:  Past Surgical History  Procedure Laterality Date  . No past surgeries      Assessment & Plan Clinical Impression: Sean Wilkerson is a 69 y.o. left handed male with history of HTN, recent syncopal episode (being evaluated with cardiac monitor) who was admitted on 10/05/15 with right sided weakness and slurred speech. CT head negative and he was treated with tPA for NIHSS 7. 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 2D echo with EF 55-60% with no wall abnormality. Carotid dopplers without ICA stenosis.Dr. Pearlean Brownie felt that patient had left brain subcortical stroke due to small vessel disease and to  continue ASA for secondary stroke prevention. Swallow evaluation done yesterday and patient started on regular diet, thin liquids with aspiration precautions for mild pharyngeal dysphagia. Patient with resultant right hemiparesis, moderate to severe dysarthria and dysphagia. Therapy evaluations done yesterday and CIR recommended for follow up therapy. Patient transferred to CIR on 10/08/2015.   Patient currently requires total with mobility secondary to muscle weakness and muscle paralysis, decreased cardiorespiratoy endurance, decreased sitting balance, decreased standing balance, decreased postural control, hemiplegia and decreased balance strategies.  Prior to hospitalization, patient was independent  with mobility and lived with Spouse, Daughter in a House home.  Home access is 5Stairs to enter.  Patient will benefit from skilled PT intervention to maximize safe functional mobility, minimize fall risk and decrease caregiver burden for planned discharge home with 24 hour assist.  Anticipate patient will benefit from follow up [facility] at discharge.  PT - End of Session Activity Tolerance: Decreased this session;Tolerates 10 - 20 min activity with multiple rests Endurance Deficit: Yes Endurance Deficit Description: SOB with transfers, fatigued easily and required rest breaks PT Assessment Rehab Potential (ACUTE/IP ONLY): Good Barriers to Discharge: Inaccessible home environment;Decreased caregiver support PT Patient demonstrates impairments in the following area(s): Balance;Behavior;Edema;Endurance;Motor;Nutrition;Safety;Sensory PT Transfers Functional Problem(s): Bed Mobility;Bed to Chair;Car;Furniture PT Locomotion Functional Problem(s): Ambulation;Wheelchair Mobility;Stairs PT Plan PT Intensity: Minimum of 1-2 x/day ,45 to 90 minutes PT Frequency: 5 out of 7 days PT Duration Estimated Length of Stay: 21-24 days PT Treatment/Interventions: Ambulation/gait training;Balance/vestibular  training;Community reintegration;Discharge planning;Disease management/prevention;DME/adaptive equipment instruction;Functional mobility training;Functional electrical stimulation;Neuromuscular re-education;Pain management;Patient/family education;Psychosocial support;Splinting/orthotics;Stair training;Therapeutic Activities;Therapeutic Exercise;UE/LE Strength taining/ROM;UE/LE Coordination activities;Wheelchair propulsion/positioning PT Transfers Anticipated Outcome(s): min A PT Locomotion Anticipated Outcome(s): min A household ambulator PT  Recommendation Follow Up Recommendations: Home health PT;24 hour supervision/assistance Patient destination: Home Equipment Recommended: To be determined  Skilled Therapeutic Intervention Skilled therapeutic intervention initiated after completion of evaluation. Discussed with patient falls risk, safety within room, and focus of therapy during stay. Discussed possible length of stay, goals, and follow-up therapy. Patient required max to +2 A for gait x 10 ft with wheelchair follow for safety, bed <> wheelchair transfers to R and L, simulated car transfer to sedan height, bed mobility, and stair negotiation and mod A for wheelchair mobility via L hemi technique. Patient impulsive with functional mobility despite max multimodal cues to wait until therapist instructed patient to move due to R sided hemiplegia. Patient requested to return to bed at end of session, left semi reclined with all needs within reach and visitors present.   PT Evaluation Precautions/Restrictions Precautions Precautions: Fall Restrictions Weight Bearing Restrictions: No General Chart Reviewed: Yes Family/Caregiver Present: No Vital SignsTherapy Vitals Temp: 98.9 F (37.2 C) Temp Source: Oral Pulse Rate: 76 Resp: 16 BP: (!) 166/63 mmHg Patient Position (if appropriate): Lying Oxygen Therapy SpO2: 98 % O2 Device: Not Delivered Pain Pain Assessment Pain Assessment: No/denies  pain Home Living/Prior Functioning Home Living Available Help at Discharge: Family;Available 24 hours/day;Other (Comment) Type of Home: House Home Access: Stairs to enter Entrance Stairs-Number of Steps: 5 Entrance Stairs-Rails: None Home Layout: Two level;Able to live on main level with bedroom/bathroom Bathroom Shower/Tub: Walk-in shower;Door [employer] Toilet: Standard Bathroom Accessibility: Yes  Lives With: Spouse;Daughter Prior Function Level of Independence: Independent with basic ADLs;Independent with homemaking with ambulation;Independent with transfers;Independent with gait  Able to Take Stairs?: Yes Driving: Yes Vocation: Full time employment 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, counseling Leisure: Hobbies-yes (Comment) Comments: Reading Vision/Perception  Vision - Assessment Eye Alignment: Within Functional Limits Ocular Range of Motion: Within Functional Limits Alignment/Gaze Preference: Within Defined Limits Tracking/Visual Pursuits: Able to track stimulus in all quads without difficulty  Cognition Overall Cognitive Status: Within Functional Limits for tasks assessed Arousal/Alertness: Awake/alert Orientation Level: Oriented X4 Attention: Sustained Sustained Attention: Appears intact Memory: Appears intact Awareness: Appears intact Problem Solving: Appears intact Behaviors: Impulsive Safety/Judgment: Appears intact Comments: PT/OT note impulsivity, however, impulsivity was not noted with self-feeding  Sensation Sensation Light Touch: Impaired Detail Light Touch Impaired Details: Impaired RUE;Impaired RLE Coordination Gross Motor Movements are Fluid and Coordinated: No Fine Motor Movements are Fluid and Coordinated: No Coordination and Movement Description: R UE/LE hemiplegia Finger Nose Finger Test: WFL on L Motor  Motor Motor: Hemiplegia  Mobility Bed Mobility Bed Mobility: Sit to Supine Sit to Supine: 2: Max assist Sit to Supine - Details: Verbal cues  for sequencing;Verbal cues for technique;Manual facilitation for placement;Manual facilitation for weight shifting Transfers Transfers: Yes Sit to Stand: 2: Max assist Stand to Sit: 2: Max Teacher, English as a foreign language Transfers: 2: Max assist;1: +2 Total assist (+2 to stabilize wheelchair) Squat Pivot Transfer Details: Verbal cues for sequencing;Verbal cues for technique;Verbal cues for precautions/safety;Manual facilitation for weight shifting Locomotion  Ambulation Ambulation: Yes Ambulation/Gait Assistance: 1: +2 Total assist Ambulation Distance (Feet): 10 Feet Assistive device: Other (Comment) (3 musketeers) Ambulation/Gait Assistance Details: Verbal cues for sequencing;Verbal cues for technique;Verbal cues for precautions/safety;Verbal cues for gait pattern;Manual facilitation for weight shifting;Manual facilitation for placement;Manual facilitation for weight bearing;Tactile cues for initiation Gait Gait: Yes Gait Pattern: Impaired Gait Pattern: Step-through pattern;Trunk flexed;Narrow base of support;Poor foot clearance - right (total A to advance/stabilize RLE) Gait velocity: decreased Stairs / Additional Locomotion Stairs: Yes Stair Management Technique: One rail Left;Step to  pattern;Forwards;Backwards Number of Stairs: 2 Height of Stairs: 3 Ramp: Not tested (comment) Curb: Not tested (comment) Wheelchair Mobility Wheelchair Mobility: Yes Wheelchair Assistance: 3: Mod assist Wheelchair Assistance Details: Verbal cues for sequencing;Verbal cues for technique;Visual cues/gestures for sequencing;Tactile cues for weight beaing Wheelchair Propulsion: Left lower extremity;Left upper extremity Wheelchair Parts Management: Needs assistance Distance: 75 ft  Trunk/Postural Assessment  Cervical Assessment Cervical Assessment: Within Functional Limits Thoracic Assessment Thoracic Assessment: Within Functional Limits Lumbar Assessment Lumbar Assessment: Within Functional Limits Postural  Control Postural Control: Deficits on evaluation Protective Responses: impaired  Balance Balance Balance Assessed: Yes Static Sitting Balance Static Sitting - Balance Support: Feet supported;Bilateral upper extremity supported Static Sitting - Level of Assistance: 5: Stand by assistance Static Sitting - Comment/# of Minutes: seated EOB Dynamic Sitting Balance Dynamic Sitting - Balance Support: Feet supported;During functional activity Dynamic Sitting - Level of Assistance: 3: Mod assist Dynamic Sitting Balance - Compensations: Sitting to complete bathing/dressing task Sitting balance - Comments: VCs for anterior weight shift. R LOB able to correct with min- mod A Static Standing Balance Static Standing - Balance Support: During functional activity;No upper extremity supported Static Standing - Level of Assistance: 2: Max assist Static Standing - Comment/# of Minutes: Standing to have pants pulled up Dynamic Standing Balance Dynamic Standing - Balance Support: During functional activity Dynamic Standing - Level of Assistance: 1: +2 Total assist Extremity Assessment  RUE Assessment RUE Assessment: Exceptions to [facility] RUE AROM (degrees) Overall AROM Right Upper Extremity: Deficits (No AROM) RUE Strength RUE Overall Strength: Deficits (0/5 throughout; Stage I brunnstroms) LUE Assessment LUE Assessment: Within Functional Limits (5/5 overall) RLE Assessment RLE Assessment: Exceptions to [facility] RLE Strength RLE Overall Strength: Deficits RLE Overall Strength Comments: 0/5 throughout LLE Assessment LLE Assessment: Within Functional Limits   See Function Navigator for Current Functional Status.   Refer to Care Plan for Long Term Goals  Recommendations for other services: None  Discharge Criteria: Patient will be discharged from PT if patient refuses treatment 3 consecutive times without medical reason, if treatment goals not met, if there is a change in medical status, if patient  makes no progress towards goals or if patient is discharged from hospital.  The above assessment, treatment plan, treatment alternatives and goals were discussed and mutually agreed upon: by patient  Laretta Alstrom 10/09/2015, 5:12 PM

## 2015-10-09 NOTE — Progress Notes (Signed)
Patient information reviewed and entered into eRehab system by Justis Dupas, RN, CRRN, PPS Coordinator.  Information including medical coding and functional independence measure will be reviewed and updated through discharge.     Per nursing patient was given "Data Collection Information Summary for Patients in Inpatient Rehabilitation Facilities with attached "Privacy Act Statement-Health Care Records" upon admission.  

## 2015-10-09 NOTE — Evaluation (Signed)
Speech Language Pathology Assessment and Plan  Patient Details  Name: Sean Wilkerson MRN: [id number] Date of Birth: [birth year]  SLP Diagnosis: Dysarthria;Dysphagia  Rehab Potential: Excellent ELOS: 3.5 weeks     Today's Date: 10/09/2015 SLP Individual Time: 0932-3557 SLP Individual Time Calculation (min): 55 min   Problem List:  Patient Active Problem List   Diagnosis Date Noted  . Stroke, acute, thrombotic ([place]) 10/08/2015  . Hemiparesis, aphasia, and dysphagia as late effect of cerebrovascular accident (CVA) ([place]) 10/08/2015  . Essential hypertension 10/08/2015  . HLD (hyperlipidemia) 10/08/2015  . Hemiplegia and hemiparesis following cerebral infarction affecting right dominant side ([place])   . Right hemiplegia ([place]) 10/06/2015  . Stroke (cerebrum) ([place]) 10/05/2015  . CVA (cerebral infarction) 10/05/2015   Past Medical History:  Past Medical History  Diagnosis Date  . Hypertension   . HTN (hypertension) 10/08/2015  . HLD (hyperlipidemia) 10/08/2015   Past Surgical History:  Past Surgical History  Procedure Laterality Date  . No past surgeries      Assessment / Plan / Recommendation Clinical Impression Patient is a 69 y.o. left handed male with history of HTN, recent syncopal episode (being evaluated with cardiac monitor) who was admitted on 10/05/15 with right sided weakness and slurred speech. CT head negative and he was treated with tPA for NIHSS 7. 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  2D echo with EF 55-60% with no wall abnormality. Carotid dopplers without ICA stenosis. Dr. Leonie Man felt that patient had left brain subcortical stroke due to small vessel disease and to continue ASA for secondary stroke prevention.  Swallow evaluation done yesterday and  patient started on regular diet, thin liquids with aspiration precautions for mild pharyngeal dysphagia. Patient with resultant right hemiparesis, moderate to severe dysarthria and dysphagia. Therapy evaluations done yesterday and CIR recommended for follow up therapy.  Patient transferred to CIR on 10/08/2015. Patient administered a cognitive-linguistic evaluation and demonstrates a moderate dysarthria characterized by imprecise consonants due to right oral-motor weakness with decreased ROM, a low vocal intensity and decreased breath support.  Patient was also administered a BSE. Patient demonstrated mildly prolonged mastication and AP transit with solid textures with mild right buccal pocketing the patient independently clears. Patient also demonstrates a suspected delayed swallow initiation and required Min A verbal cues for use of small sips with thin liquids. Trials were not attempted with straws due to silent aspiration per MBS on 12/19. Recommend patient continue current diet of regular textures with thin liquids via cup. Patient's cognitive-linguistic function appeared [facility] for all tasks assessed. Patient would benefit from skilled SLP intervention to maximize speech intelligibility and swallowing function and overall functional independence prior to discharge.   Skilled Therapeutic Interventions          Administered a cognitive-linguistic evaluation and BSE. Please see above for details.   SLP Assessment  Patient will need skilled Speech Lanaguage Pathology Services during CIR admission    Recommendations  SLP Diet Recommendations: Thin;Age appropriate regular solids Liquid Administration via: Cup;No straw Medication Administration: Whole meds with puree Supervision: Patient able to self feed Compensations: Slow rate;Small sips/bites Postural Changes and/or Swallow Maneuvers: Seated upright 90 degrees Oral Care Recommendations: Oral care BID Recommendations for Other Services: Neuropsych  consult Patient destination: Home Follow up Recommendations: Outpatient SLP;Home Health SLP Equipment Recommended: None  recommended by SLP    SLP Frequency 3 to 5 out of 7 days   SLP Treatment/Interventions Cueing hierarchy;Functional tasks;Dysphagia/aspiration precaution training;Environmental controls;Speech/Language facilitation;Internal/external aids;Patient/family education;Therapeutic Activities   Pain Pain Assessment Pain Assessment: No/denies pain Prior Functioning Type of Home: House  Lives With: Spouse;Daughter Available Help at Discharge: Family;Available 24 hours/day;Other (Comment) Vocation: Full time employment  Function:  Eating Eating   Modified Consistency Diet: No Eating Assist Level: Supervision or verbal cues           Cognition Comprehension Comprehension assist level: Understands basic 90% of the time/cues < 10% of the time  Expression   Expression assist level: Expresses basic 50 - 74% of the time/requires cueing 25 - 49% of the time. Needs to repeat parts of sentences.  Social Interaction Social Interaction assist level: Interacts appropriately 90% of the time - Needs monitoring or encouragement for participation or interaction.  Problem Solving Problem solving assist level: Solves basic 90% of the time/requires cueing < 10% of the time  Memory Memory assist level: Recognizes or recalls 90% of the time/requires cueing < 10% of the time   Short Term Goals: Week 1: SLP Short Term Goal 1 (Week 1): Patient will consume current diet with minimal overt s/s of aspiration with supervision verbal cues for use of small bites/sips via cup.  SLP Short Term Goal 2 (Week 1): Patient will utilize speech intelligibility strategies at the phrase level with Min A verbal cues to self-monitor and correct errors.  SLP Short Term Goal 3 (Week 1): Patient will utilize diaphragmatic breathing at the word level with Min A verbal cues for accuracy and to self-monitor and correct  errors.  SLP Short Term Goal 4 (Week 1): Patient will produce words with bilabial consonants in the first, medial and final position with 90% intelligibility with Min A verbal cues to self-monitor and correct errors.   Refer to Care Plan for Long Term Goals  Recommendations for other services: Neuropsych  Discharge Criteria: Patient will be discharged from SLP if patient refuses treatment 3 consecutive times without medical reason, if treatment goals not met, if there is a change in medical status, if patient makes no progress towards goals or if patient is discharged from hospital.  The above assessment, treatment plan, treatment alternatives and goals were discussed and mutually agreed upon: by patient  Jorie Zee 10/09/2015, 4:23 PM

## 2015-10-09 NOTE — Progress Notes (Signed)
[place] PHYSICAL MEDICINE & REHABILITATION     PROGRESS NOTE    Subjective/Complaints: Had a good night. Very happy and thankful to be here. Excited to start therapies today. Feels like he's ready to have BM  ROS: Pt denies fever, rash/itching, headache, blurred or double vision, nausea, vomiting, abdominal pain, diarrhea, chest pain, shortness of breath, palpitations, dysuria, dizziness, neck or back pain, bleeding, anxiety, or depression   Objective: Vital Signs: Blood pressure 172/85, pulse 66, temperature 98.4 F (36.9 C), temperature source Oral, resp. rate 16, SpO2 99 %. No results found.  Recent Labs  10/09/15 0456  WBC 6.5  HGB 12.9*  HCT 39.3  PLT 235    Recent Labs  10/09/15 0456  NA 140  K 4.0  CL 107  GLUCOSE 131*  BUN 9  CREATININE 0.79  CALCIUM 9.1   CBG (last 3)   Recent Labs  10/07/15 1624 10/07/15 2226  GLUCAP 131* 124*    Wt Readings from Last 3 Encounters:  10/05/15 115.5 kg (254 lb 10.1 oz)    Physical Exam:  Constitutional: He is oriented to person, place, and time. He appears well-developed and well-nourished.  HENT:  Head: Normocephalic and atraumatic.  Mouth/Throat: Oropharynx is pink, clear and moist.   dentures in.  Eyes: Conjunctivae and EOM are normal. Pupils are equal, round, and reactive to light. Right eye exhibits no discharge. Left eye exhibits no discharge.  Neck: Normal range of motion. Neck supple.  Cardiovascular: Normal rate and regular rhythm.  No murmur heard. Respiratory: Effort normal and breath sounds normal. No respiratory distress. He has no wheezes.  GI: Soft. Bowel sounds are normal. He exhibits no distension. There is no tenderness.  Musculoskeletal: He exhibits edema (RUE). He exhibits no tenderness.  Neurological: He is alert and oriented to person, place, and time. He has normal reflexes.  Right hemifacial weakness and tongue deviation to right.  Mild dysphonia with moderate  dysarthria---speech very intelligible.  Has good awareness and insight into deficits.  LUE/LLE: 5/5 proximal to distal RUE: 0/5 prox to distal. RLE: 1/5 HE and KE, 0/5 ADF/APF. Senses pain/LT in right arm, leg, face Skin: Skin is warm and dry.  Psychiatric: He has a normal mood and affect. His behavior is normal. Judgment and thought content normal.     Assessment/Plan: 1. 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 Physiatrist is providing close team supervision and 24 hour management of active medical problems listed below. Physiatrist and rehab team continue to assess barriers to discharge/monitor patient progress toward functional and medical goals.  Function:  Bathing Bathing position      Bathing parts      Bathing assist        Upper Body Dressing/Undressing Upper body dressing                    Upper body assist        Lower Body Dressing/Undressing Lower body dressing                                  Lower body assist        Toileting Toileting          Toileting assist     Transfers Chair/bed transfer  Secondary school teacher Comprehension Comprehension assist level: Understands basic 90% of the time/cues < 10% of the time  Expression Expression assist level: Expresses basic 90% of the time/requires cueing < 10% of the time.  Social Interaction Social Interaction assist level: Interacts appropriately 90% of the time - Needs monitoring or encouragement for participation or interaction.  Problem Solving Problem solving assist level: Solves basic 75 - 89% of the time/requires cueing 10 - 24% of the time  Memory     Medical Problem List and Plan: 1. Hemiplegia, dysarthria,  dysphagia secondary to left corona radiata, external capsule, and posterior left lentiform nuclei infarct  -begins CIR therapies today 2. DVT Prophylaxis/Anticoagulation: Mechanical: Sequential compression devices, below knee Bilateral lower extremities 3. Pain Management: Tylenol prn.  4. Mood: LCSW to follow for evaluation and support.  5. Neuropsych: This patient is capable of making decisions on hid own behalf. 6. Skin/Wound Care: Routine pressure relief measures.  7. Fluids/Electrolytes/Nutrition: Monitor I/O. I personally reviewed the patient's labs today---electrolytes within normal limits.  8. HTN: Monitor BP tid with permissive HTN and gradual normalization over next 5 days. Continue norvasc and coreg daily.  -increase norvasc to 7.5mg  today   -orthostatic vs daily for now 9. Dyslipidemia: Cotinue Lipitor daily.  LOS (Days) 1 A FACE TO FACE EVALUATION WAS PERFORMED  Skila Rollins T 10/09/2015 8:08 AM

## 2015-10-09 NOTE — Telephone Encounter (Addendum)
Pharmacy called and stated the quantity for aspirin is only 1 and they need to know if it needs to be corrected.

## 2015-10-09 NOTE — Telephone Encounter (Signed)
Ok will address it

## 2015-10-09 NOTE — Evaluation (Signed)
Occupational Therapy Assessment and Plan  Patient Details  Name: Kden Wagster MRN: [id number] Date of Birth: 1946/02/08  OT Diagnosis: abnormal posture, flaccid hemiplegia and hemiparesis, hemiplegia affecting non-dominant side, muscle weakness (generalized) and swelling of limb Rehab Potential: Rehab Potential (ACUTE ONLY): Good ELOS: 3.5 weeks   Today's Date: 10/09/2015 OT Individual Time: 1115-1200 OT Individual Time Calculation (min): 45 min     Problem List:  Patient Active Problem List   Diagnosis Date Noted  . Stroke, acute, thrombotic ([place]) 10/08/2015  . Hemiparesis, aphasia, and dysphagia as late effect of cerebrovascular accident (CVA) ([place]) 10/08/2015  . Essential hypertension 10/08/2015  . HLD (hyperlipidemia) 10/08/2015  . Hemiplegia and hemiparesis following cerebral infarction affecting right dominant side ([place])   . Right hemiplegia ([place]) 10/06/2015  . Stroke (cerebrum) ([place]) 10/05/2015  . CVA (cerebral infarction) 10/05/2015    Past Medical History:  Past Medical History  Diagnosis Date  . Hypertension   . HTN (hypertension) 10/08/2015  . HLD (hyperlipidemia) 10/08/2015   Past Surgical History:  Past Surgical History  Procedure Laterality Date  . No past surgeries      Assessment & Plan Clinical Impression: Brach Birdsall is a 69 y.o. left handed male with history of HTN, recent syncopal episode (being evaluated with cardiac monitor) who was admitted on 10/05/15 with right sided weakness and slurred speech. CT head negative and he was treated with tPA for NIHSS 7. 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 2D echo with EF 55-60% with no wall abnormality. Carotid dopplers without ICA stenosis.Dr. Leonie Man felt that patient had left brain  subcortical stroke due to small vessel disease and to continue ASA for secondary stroke prevention. Swallow evaluation done yesterday and patient started on regular diet, thin liquids with aspiration precautions for mild pharyngeal dysphagia. Patient with resultant right hemiparesis, moderate to severe dysarthria and dysphagia. Therapy evaluations done yesterday and CIR recommended for follow up therapy.  Patient transferred to CIR on 10/08/2015 .    Patient currently requires max with basic self-care skills secondary to muscle weakness, decreased cardiorespiratoy endurance, abnormal tone, unbalanced muscle activation and decreased coordination and decreased sitting balance, decreased standing balance, decreased postural control, hemiplegia and decreased balance strategies.  Prior to hospitalization, patient could complete ADLs/IADLs with independent .  Patient will benefit from skilled intervention to decrease level of assist with basic self-care skills and increase independence with basic self-care skills prior to discharge home with care partner.  Anticipate patient will require minimal physical assistance and follow up home health.  OT - End of Session Activity Tolerance: Tolerates 10 - 20 min activity with multiple rests (Required rest breaks throughout seated bathing/dressing) Endurance Deficit: Yes OT Assessment Rehab Potential (ACUTE ONLY): Good OT Patient demonstrates impairments in the following area(s): Balance;Behavior;Endurance;Safety;Pain;Motor;Sensory OT Basic ADL's Functional Problem(s): Eating;Grooming;Bathing;Dressing;Toileting OT Transfers Functional Problem(s): Toilet;Tub/Shower OT Additional Impairment(s): Fuctional Use of Upper Extremity OT Plan OT Intensity: Minimum of 1-2 x/day, 45 to 90 minutes OT Frequency: 5 out of 7 days OT Duration/Estimated Length of Stay: 3.5 weeks OT Treatment/Interventions: Balance/vestibular training;Discharge planning;Cognitive  remediation/compensation;Community reintegration;DME/adaptive equipment instruction;Disease mangement/prevention;Functional mobility training;Functional electrical stimulation;Neuromuscular re-education;Pain management;Patient/family education;Self Care/advanced ADL retraining;Therapeutic Activities;Splinting/orthotics;Therapeutic Exercise;UE/LE Strength taining/ROM;UE/LE Coordination activities;Wheelchair propulsion/positioning OT Self Feeding Anticipated Outcome(s): Mod I OT Basic Self-Care Anticipated Outcome(s): Min A OT Toileting Anticipated Outcome(s):  Min A OT Bathroom Transfers Anticipated Outcome(s): Min A OT Recommendation Patient destination: Home Follow Up Recommendations: Home health OT Equipment Recommended: To be determined   Skilled Therapeutic Intervention Session One: Pt seen for OT eval and ADL bathing/ dressing session. Pt in supine upon arrival, voicing complaints of nausea, however, willing to attempt therapy. Pt transferred to EOB with max A with VCs for rolling technique. Pt sat EOB with min- mod steadying assist to complete sponge bath with +2 assist for stabilizing and to assist with bathing. Pt voiced feeling dizzy and requested return to supine. Buttock hygiene and brief donned in supine. Pt without shirt/ pants during this session, reports wife will bring it later.  Pt left in supine at end of session, R UE placed on pillow for edema management and all needs in reach. RN made aware of pt's complaints of nausea.  Pt educated throughout session regarding CIR, role of OT, OT goals, POC and d/c planning.   Session Two: Pt seen for OT therapy session focusing on ADL re-training. Pt in supine upon arrival with wife and daughter present. Pt voiced feeling better and agreeable to tx session. He transferred to EOB and completed UB/ LB dressing on EOB. Steadying assist required by one person while second person assisted with clothing management. Educated regarding hemi-dressing  technique. He stood with +2 assist to have pants pulled up total A. Squat pivot transfer completed to L with +2 assist. Pt completed oral hygiene seated in w/c at sink with set-up of supplies and assist for cleaning dentures. Pt taken out into hall in w/c and educated regarding hemi-technique for steering. VCs and min A required for steering in controlled environment.  Pt returned to room at end of session, agreeable to stay sitting up in chair for lunch. All needs in reach and caregivers present.   Precautions/Restrictions  Precautions Precautions: Fall Restrictions Weight Bearing Restrictions: No General Chart Reviewed: Yes Pain Pain Assessment Pain Assessment: No/denies pain Home Living/Prior Functioning Home Living Family/patient expects to be discharged to:: Private residence Living Arrangements: Spouse/significant other Available Help at Discharge: Family, Available 24 hours/day, Other (Comment) Type of Home: House Home Access: Stairs to enter [employer] of Steps: 5 Entrance Stairs-Rails: None Home Layout: Two level, Able to live on main level with bedroom/bathroom Bathroom Shower/Tub: Gaffer, Door [employer] Toilet: Standard Bathroom Accessibility: Yes  Lives With: Spouse, Daughter IADL History Homemaking Responsibilities: Yes Current License: Yes Mode of Transportation: Car Occupation: Full time employment Type of Occupation: Environmental education officer Prior Function Level of Independence: Independent with basic ADLs, Independent with homemaking with ambulation, Independent with transfers, Independent with gait  Able to Take Stairs?: Yes Driving: Yes Vocation: Full time employment Vocation Requirements: Theme park manager, counseling Leisure: Hobbies-yes (Comment) Comments: Reading Vision/Perception  Vision- History Baseline Vision/History: Wears glasses Wears Glasses: At all times Patient Visual Report: No change from baseline Vision- Assessment Vision Assessment?:  Yes Eye Alignment: Within Functional Limits Ocular Range of Motion: Within Functional Limits Alignment/Gaze Preference: Within Defined Limits Tracking/Visual Pursuits: Able to track stimulus in all quads without difficulty Visual Fields: No apparent deficits  Cognition Overall Cognitive Status: Within Functional Limits for tasks assessed Arousal/Alertness: Awake/alert Orientation Level: Person;Place;Situation Person: Oriented Place: Oriented Situation: Oriented Year: 2016 Month: December Day of Week: Correct Memory: Appears intact Immediate Memory Recall: Sock;Blue;Bed Memory Recall: Sock;Blue;Bed Memory Recall Sock: Without Cue Memory Recall Blue: Without Cue Memory Recall Bed: Without Cue Attention: Sustained Sustained Attention: Appears intact Awareness: Appears intact Problem Solving: Appears intact Behaviors: Impulsive Safety/Judgment:  Impaired Comments: Slightly impulsive Sensation Sensation Light Touch: Impaired Detail Light Touch Impaired Details: Impaired RUE;Impaired RLE Coordination Gross Motor Movements are Fluid and Coordinated: No Fine Motor Movements are Fluid and Coordinated: No Coordination and Movement Description: R UE/LE hemiplegia Finger Nose Finger Test: WFL on L Motor  Motor Motor: Hemiplegia Motor - Skilled Clinical Observations: R hemi Trunk/Postural Assessment  Cervical Assessment Cervical Assessment: Within Functional Limits Thoracic Assessment Thoracic Assessment: Within Functional Limits Lumbar Assessment Lumbar Assessment: Within Functional Limits Postural Control Postural Control: Deficits on evaluation (R lean)  Balance Balance Balance Assessed: Yes Static Sitting Balance Static Sitting - Balance Support: Feet supported;Bilateral upper extremity supported Static Sitting - Level of Assistance: 4: Min assist Static Sitting - Comment/# of Minutes: Sitting EOB Dynamic Sitting Balance Dynamic Sitting - Balance Support: Feet  supported;During functional activity Dynamic Sitting - Level of Assistance: 3: Mod assist Dynamic Sitting Balance - Compensations: Sitting to complete bathing/dressing task Sitting balance - Comments: VCs for anterior weight shift. R LOB able to correct with min- mod A Static Standing Balance Static Standing - Balance Support: No upper extremity supported Static Standing - Level of Assistance: 1: +2 Total assist Static Standing - Comment/# of Minutes: Standing to have pants pulled up Extremity/Trunk Assessment RUE Assessment RUE Assessment: Exceptions to [facility] RUE AROM (degrees) Overall AROM Right Upper Extremity: Deficits (No AROM) RUE Strength RUE Overall Strength: Deficits (0/5 throughout; Stage I brunnstroms) LUE Assessment LUE Assessment: Within Functional Limits (5/5 overall)   See Function Navigator for Current Functional Status.   Refer to Care Plan for Long Term Goals  Recommendations for other services: None  Discharge Criteria: Patient will be discharged from OT if patient refuses treatment 3 consecutive times without medical reason, if treatment goals not met, if there is a change in medical status, if patient makes no progress towards goals or if patient is discharged from hospital.  The above assessment, treatment plan, treatment alternatives and goals were discussed and mutually agreed upon: by patient and by family  Ernestina Patches 10/09/2015, 3:50 PM

## 2015-10-10 ENCOUNTER — Inpatient Hospital Stay (HOSPITAL_COMMUNITY): Payer: Medicare Other | Admitting: Physical Therapy

## 2015-10-10 ENCOUNTER — Inpatient Hospital Stay (HOSPITAL_COMMUNITY): Payer: Medicare Other

## 2015-10-10 ENCOUNTER — Inpatient Hospital Stay (HOSPITAL_COMMUNITY): Payer: Medicare Other | Admitting: Speech Pathology

## 2015-10-10 DIAGNOSIS — G8191 Hemiplegia, unspecified affecting right dominant side: Secondary | ICD-10-CM

## 2015-10-10 MED ORDER — AMLODIPINE BESYLATE 10 MG PO TABS
10.0000 mg | ORAL_TABLET | Freq: Every day | ORAL | Status: DC
  Administered 2015-10-11 – 2015-10-30 (×20): 10 mg via ORAL
  Filled 2015-10-10 (×20): qty 1

## 2015-10-10 NOTE — IPOC Note (Signed)
Overall Plan of Care [facility](IPOC) Patient Details Name: Sean BirchwoodLarry Lee Wilkerson MRN: [id number] DOB: 12/25/1945  Admitting Diagnosis: L cva  Hospital Problems: Principal Problem:   Stroke, acute, thrombotic (HCC) Active Problems:   Right hemiplegia (HCC)   Hemiparesis, aphasia, and dysphagia as late effect of cerebrovascular accident (CVA) (HCC)   Essential hypertension   HLD (hyperlipidemia)   Hemiplegia and hemiparesis following cerebral infarction affecting right dominant side (HCC)     Functional Problem List: Nursing Bladder, Bowel, Medication Management, Motor, Safety  PT Balance, Behavior, Edema, Endurance, Motor, Nutrition, Safety, Sensory  OT Balance, Behavior, Endurance, Safety, Pain, Motor, Sensory  SLP    TR         Basic ADL's: OT Eating, Grooming, Bathing, Dressing, Toileting     Advanced  ADL's: OT       Transfers: PT Bed Mobility, Bed to Chair, Car, Occupational psychologisturniture  OT Toilet, Research scientist (life sciences)Tub/Shower     Locomotion: PT Ambulation, Psychologist, prison and probation servicesWheelchair Mobility, Stairs     Additional Impairments: OT Fuctional Use of Upper Extremity  SLP Swallowing, Communication expression    TR      Anticipated Outcomes Item Anticipated Outcome  Self Feeding Mod I  Swallowing  Mod I    Basic self-care  Min A  Toileting  Min A   Bathroom Transfers Min A  Bowel/Bladder  mod I  Transfers  min A  Locomotion  min A household ambulator  Communication  Mod I  Cognition     Pain  n/a  Safety/Judgment  mod I   Therapy Plan: PT Intensity: Minimum of 1-2 x/day ,45 to 90 minutes PT Frequency: 5 out of 7 days PT Duration Estimated Length of Stay: 21-24 days OT Intensity: Minimum of 1-2 x/day, 45 to 90 minutes OT Frequency: 5 out of 7 days OT Duration/Estimated Length of Stay: 3.5 weeks SLP Intensity: Minumum of 1-2 x/day, 30 to 90 minutes SLP Frequency: 3 to 5 out of 7 days SLP Duration/Estimated Length of Stay: 3.5 weeks        Team Interventions: Nursing Interventions Patient/Family  Education, Bladder Management, Bowel Management, Disease Management/Prevention, Pain Management, Medication Management, Discharge Planning  PT interventions Ambulation/gait training, Balance/vestibular training, Community reintegration, Discharge planning, Disease management/prevention, DME/adaptive equipment instruction, Functional mobility training, Functional electrical stimulation, Neuromuscular re-education, Pain management, Patient/family education, Psychosocial support, Splinting/orthotics, Stair training, Therapeutic Activities, Therapeutic Exercise, UE/LE Strength taining/ROM, UE/LE Coordination activities, Wheelchair propulsion/positioning  OT Interventions Warden/rangerBalance/vestibular training, Discharge planning, Cognitive remediation/compensation, FirefighterCommunity reintegration, Fish farm managerDME/adaptive equipment instruction, Disease mangement/prevention, Functional mobility training, Functional electrical stimulation, Neuromuscular re-education, Pain management, Patient/family education, Self Care/advanced ADL retraining, Therapeutic Activities, Splinting/orthotics, Therapeutic Exercise, UE/LE Strength taining/ROM, UE/LE Coordination activities, Wheelchair propulsion/positioning  SLP Interventions Cueing hierarchy, Functional tasks, Dysphagia/aspiration precaution training, Environmental controls, Speech/Language facilitation, Internal/external aids, Patient/family education, Therapeutic Activities  TR Interventions    SW/CM Interventions Discharge Planning, Psychosocial Support, Patient/Family Education    Team Discharge Planning: Destination: PT-Home ,OT- Home , SLP-Home Projected Follow-up: PT-Home health PT, 24 hour supervision/assistance, OT-  Home health OT, SLP-Outpatient SLP, Home Health SLP Projected Equipment Needs: PT-To be determined, OT- To be determined, SLP-None recommended by SLP Equipment Details: PT- , OT-  Patient/family involved in discharge planning: PT- Patient,  OT-Family member/caregiver,  Patient, SLP-Patient  MD ELOS: 20-24 days Medical Rehab Prognosis:  Good Assessment: 69 y.o. left handed male with history of HTN, recent syncopal episode (being evaluated with cardiac monitor) who was admitted on 10/05/15 with right sided weakness and slurred speech. CT head negative and he was treated  with tPA for MRI/MRA brain done showing acute acute 3 cm oval lacunar infarct extending from the left corona radiata through the left external capsule and posterior left lentiform nuclei and mild to moderate right PCA P2 segment stenosis with preserved distal flow. Neurology felt that patient had left brain subcortical stroke due to small vessel disease and to continue ASA for secondary stroke prevention. Swallow evaluation done and patient started on regular diet, thin liquids with aspiration precautions for mild pharyngeal dysphagia. Patient with resultant right hemiparesis, moderate to severe dysarthria and dysphagia.   See Team Conference Notes for weekly updates to the plan of care

## 2015-10-10 NOTE — Progress Notes (Signed)
Physical Therapy Session Note  Patient Details  Name: Sean Wilkerson MRN: [id number] Date of Birth: [birth year]  Today's Date: 10/10/2015 PT Individual Time: 7829-56211030-1115 and 1400-1455 PT Individual Time Calculation (min): 45 min and 55 min Short Term Goals: Week 1:  PT Short Term Goal 1 (Week 1): Patient will transfer bed <> wheelchair with assist of one person. PT Short Term Goal 2 (Week 1): Patient will perform sit <> stand with consistent mod A.  PT Short Term Goal 3 (Week 1): Patient will ambulate 25 ft with assist of one person. PT Short Term Goal 4 (Week 1): Patient will perform stair negotiation with assist of 2.  PT Short Term Goal 5 (Week 1): Patient will propel wheelchair via L hemi technique x 50 ft with supervision.  Skilled Therapeutic Interventions/Progress Updates:   Treatment 1: Patient sitting in wheelchair upon arrival. Session focused on R NMR, functional transfers, ambulation, standing balance, postural control, wheelchair seating and positioning, and activity tolerance. Performed squat pivot transfers wheelchair <> mat table to R and L with supervision and max cues for removing L leg rest, locking L brake, and removing L arm rest and performed transfer via Bobath technique to promote forward weight shift with max A x 1, max multimodal cues for sequencing and technique. Donned R allard AFO for gait training x 15 ft with +2A via 3 musketeers technique, total A to advance/stabilize RLE and max multimodal cues for upright posture, decreased L step length, and maintaining COG over BOS when advancing LLE instead of keeping weight shifted posteriorly with trunk forward flexion. Patient required prolonged rest break due to shortness of breath and fatigue, seated BP 151/88, HR 70. Performed standing reaching task using LUE to place horseshoe to raised basketball rim to facilitate anterior weight shift over LLE with stepping task, max A to stabilize RLE in stance. Switched out wheelchair  to provide increased lumbar support with act-a-back wheelchair back and switched cushion to promote upright sitting posture due to slouched posture with posterior pelvic tilt in current sling back wheelchair for improved sitting tolerance with patient reporting increased comfort in new wheelchair. Patient sat edge of mat while therapist retrieved wheelchair with distant supervision provided by rehab tech. Patient returned to room and left sitting in wheelchair with all needs within reach.   Treatment 2: Focus on wheelchair mobility, functional transfers, RLE NMR, standing balance, trunk activation, and activity tolerance. Patient sitting in wheelchair, handoff from SLP. Patient instructed in propelling wheelchair via L hemi technique with initial supervision and min cues for sequencing faded to mod-max A and max multimodal cues for sequencing and technique for obstacle negotiation on R. R arm trough noted to be coming off wheelchair and too small for patient, retrieved 1/2 lap tray for improved RUE positioning in wheelchair. Performed squat pivot transfers with max A via Bobath technique to promote forward weight shift. Patient transferred sit <> supine on mat table with max A. Neuromuscular re-ed in supine: glute sets x 20, bridges squeezing ball between BLE to facilitate co-contraction with therapist stabilizing RLE 5 reps x 4 sets, D1 lower extremity PNF with RLE activation noted with gross extension with cues to "push therapist away" and no active RLE flexion noted.  Patient performed sit <> stand from edge of mat with min-mod A to stabilize RLE and provided hemiwalker for LUE support in static standing between trials of reaching to remove cards from back of mirror with LUE, max multimodal cues for weight shifting and postural control  and assist to stabilize RLE. Patient declined further standing due to fatigue after 3 trials between 1-2 min each but completed task of removing cards in sitting reaching  outside BOS to L and forward to promote L lateral trunk elongation/R lateral trunk shortening and trunk rotation to R to place cards on R side with min A to prevent LOB. Patient returned to room and left sitting in wheelchair with needs within reach and family present.    Therapy Documentation Precautions:  Precautions Precautions: Fall Restrictions Weight Bearing Restrictions: No Pain: Pain Assessment Pain Assessment: No/denies pain   See Function Navigator for Current Functional Status.   Therapy/Group: Individual Therapy  Kerney Elbe 10/10/2015, 12:17 PM

## 2015-10-10 NOTE — Progress Notes (Signed)
[place] PHYSICAL MEDICINE & REHABILITATION     PROGRESS NOTE    Subjective/Complaints: Patient seen this morning sitting up in chair. He had a good night last night and states therapies were not yesterday, is ready to begin again today  ROS: Denies CP, SOB, N/V/D   Objective: Vital Signs: Blood pressure 150/78, pulse 69, temperature 98.5 F (36.9 C), temperature source Oral, resp. rate 18, SpO2 98 %. No results found.  Recent Labs  10/09/15 0456  WBC 6.5  HGB 12.9*  HCT 39.3  PLT 235    Recent Labs  10/09/15 0456  NA 140  K 4.0  CL 107  GLUCOSE 131*  BUN 9  CREATININE 0.79  CALCIUM 9.1   CBG (last 3)   Recent Labs  10/07/15 1624 10/07/15 2226  GLUCAP 131* 124*    Wt Readings from Last 3 Encounters:  10/05/15 115.5 kg (254 lb 10.1 oz)    Physical Exam:  BP 150/78 mmHg  Pulse 69  Temp(Src) 98.5 F (36.9 C) (Oral)  Resp 18  SpO2 98% Constitutional: He appears well-developed and well-nourished. NAD. Vital signs reviewed HENT: Normocephalic and atraumatic.  Mouth/Throat: Oropharynx is pink, clear and moist.  Eyes: Conjunctivae and EOM are normal. Right eye exhibits no discharge. Left eye exhibits no discharge.  Cardiovascular: Normal rate and regular rhythm.No murmur heard. Respiratory: Effort normal and breath sounds normal. No respiratory distress. He has no wheezes.  GI: Soft. Bowel sounds are normal. He exhibits no distension. There is no tenderness.  Musculoskeletal: He exhibits edema (RUE). He exhibits no tenderness.  Neurological: He is alert and oriented.  Right hemifacial weakness and tongue deviation to right.  Mild dysphonia with moderate dysarthria ---speech intelligible.  Has good awareness and insight into deficits.  LUE/LLE: 5/5 proximal to distal RUE: 0/5 prox to distal. RLE: 1/5 HE and KE, 0/5 ADF/APF.  Skin: Skin is warm and dry.  Psychiatric: He has a normal mood and affect. His behavior is normal. Judgment and thought  content normal.   Assessment/Plan: 1. 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 Physiatrist is providing close team supervision and 24 hour management of active medical problems listed below. Physiatrist and rehab team continue to assess barriers to discharge/monitor patient progress toward functional and medical goals.  Function:  Bathing Bathing position   Position: Wheelchair/chair at sink  Bathing parts Body parts bathed by patient: Right arm, Chest, Abdomen, Front perineal area, Right upper leg, Left upper leg Body parts bathed by helper: Left arm, Buttocks, Right lower leg, Left lower leg, Back  Bathing assist Assist Level: 2 helpers      Upper Body Dressing/Undressing Upper body dressing   What is the patient wearing?: Pull over shirt/dress     Pull over shirt/dress - Perfomed by patient: Put head through opening Pull over shirt/dress - Perfomed by helper: Thread/unthread right sleeve, Thread/unthread left sleeve, Pull shirt over trunk        Upper body assist Assist Level: 2 helpers      Lower Body Dressing/Undressing Lower body dressing   What is the patient wearing?: Pants, Socks, Shoes     Pants- Performed by patient: Thread/unthread left pants leg Pants- Performed by helper: Thread/unthread right pants leg, Thread/unthread left pants leg, Pull pants up/down       Socks - Performed by helper: Don/doff left sock, Don/doff  right sock   Shoes - Performed by helper: Don/doff right shoe, Don/doff left shoe, Fasten right, Fasten left          Lower body assist Assist for lower body dressing: 2 Helpers      Toileting Toileting Toileting activity did not occur: Safety/medical concerns        Toileting assist     Transfers Chair/bed transfer   Chair/bed  transfer method: Squat pivot Chair/bed transfer assist level: 2 helpers Chair/bed transfer assistive device: Armrests     Locomotion Ambulation     Max distance: 10 Assist level: 2 helpers   Wheelchair   Type: Manual Max wheelchair distance: 75 Assist Level: Moderate assistance (Pt 50 - 74%)  Cognition Comprehension Comprehension assist level: Understands basic 90% of the time/cues < 10% of the time  Expression Expression assist level: Expresses basic 50 - 74% of the time/requires cueing 25 - 49% of the time. Needs to repeat parts of sentences.  Social Interaction Social Interaction assist level: Interacts appropriately 90% of the time - Needs monitoring or encouragement for participation or interaction.  Problem Solving Problem solving assist level: Solves basic 90% of the time/requires cueing < 10% of the time  Memory Memory assist level: Recognizes or recalls 90% of the time/requires cueing < 10% of the time   Medical Problem List and Plan: 1. 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 DVT Prophylaxis/Anticoagulation: Mechanical: Sequential compression devices, below knee Bilateral lower extremities 3. Pain Management: Tylenol prn.  4. Mood: LCSW to follow for evaluation and support.  5. Neuropsych: This patient is capable of making decisions on hid own behalf. 6. Skin/Wound Care: Routine pressure relief measures.  7. Fluids/Electrolytes/Nutrition: Monitor I/O.   Electrolytes WNL on 12/22 8. HTN: Monitor BP tid with permissive HTN and gradual normalization over next 5 days. Continue norvasc and coreg daily.  -Will increase Norvasc to 10 MG tomorrow   -orthostatic vs daily for now 9. Dyslipidemia: Cotinue Lipitor daily.   LOS (Days) 2 A FACE TO FACE EVALUATION WAS PERFORMED  Ankit Karis Jubanil Patel 10/10/2015 10:37 AM

## 2015-10-10 NOTE — Progress Notes (Signed)
Occupational Therapy Session Note  Patient Details  Name: Sean BirchwoodLarry Lee Pebley MRN: [id number] Date of Birth: [birth year]  Today's Date: 10/10/2015 OT Individual Time: 0700-0800 OT Individual Time Calculation (min): 60 min    Short Term Goals: Week 1:  OT Short Term Goal 1 (Week 1): Pt will dress UB with mod A using hemi technique OT Short Term Goal 2 (Week 1): Pt will complete toilet transfer with max A +1 OT Short Term Goal 3 (Week 1): Pt will display carry over of hemi dressing technique with min VCs OT Short Term Goal 4 (Week 1): Pt will maintain static standing balance with min A in prep for LB dressing task OT Short Term Goal 5 (Week 1): Pt will utilize L UE during functional task with max A  Skilled Therapeutic Interventions/Progress Updates:    Pt engaged in BADL retraining including bathing and dressing with sit<>stand from w/c at sink.  Pt required max A for scoot transfer to w/c with max verbal cues to lean forward during transfer.  Pt educated on hemi bathing and dressing techniques.  Pt erquired hand over hand assist using RUE to assist with bathing.  Pt required tot A + 2 for sit<>stand and standing to bathe buttocks and pull up pants. Pt exhibited impulsivity with transitional movements but responds to commands appropriately.  Pt's right knee required blocking when standing to prevent knee buckling.  Focus on activity tolerance, bed mobility, functional transfers, sit<>stand, standing balance, hemi bathing and dressing techniques, and safety awareness to increase independence with BADLs.   Therapy Documentation Precautions:  Precautions Precautions: Fall Restrictions Weight Bearing Restrictions: No Pain:  Pt denied pain  See Function Navigator for Current Functional Status.   Therapy/Group: Individual Therapy  Rich BraveLanier, Bettie Swavely Chappell 10/10/2015, 8:03 AM

## 2015-10-10 NOTE — Progress Notes (Signed)
Speech Language Pathology Daily Session Note  Patient Details  Name: Sean BirchwoodLarry Lee Wilkerson MRN: [id number] Date of Birth: Oct 19, 1945  Today's Date: 10/10/2015 SLP Individual Time: 1330-1400 SLP Individual Time Calculation (min): 30 min  Short Term Goals: Week 1: SLP Short Term Goal 1 (Week 1): Patient will consume current diet with minimal overt s/s of aspiration with supervision verbal cues for use of small bites/sips via cup.  SLP Short Term Goal 2 (Week 1): Patient will utilize speech intelligibility strategies at the phrase level with Min A verbal cues to self-monitor and correct errors.  SLP Short Term Goal 3 (Week 1): Patient will utilize diaphragmatic breathing at the word level with Min A verbal cues for accuracy and to self-monitor and correct errors.  SLP Short Term Goal 4 (Week 1): Patient will produce words with bilabial consonants in the first, medial and final position with 90% intelligibility with Min A verbal cues to self-monitor and correct errors.   Skilled Therapeutic Interventions: Skilled speech therapy session focused on speech and voice goals. Patient continues to exhibit a very low intensity and hoarse voice and with decreased labial and lingual ROM and strength. Clinician led patient through exercises for voice, starting with breathing slowly in through nose and out through mouth, followed by gentle phonation "ahh", then producing "eee", followed by producing "eee" and increasing pitch to as high as he could go while maintaining a continuous voice. Patient then completed exercise of holding "eee" and increasing vocal intensity to as loud as he could while still maintaining a continous voice. Clinician led patient through alternating "ooo-eee" with cues to exaggerate labial movements while phonating.  Lastly, clinician led patient through production of "buttercup" with cues to say this word continuously while maintaining adequate articulation and clarity of production. Patient  was able to return demonstrate all therapy tasks, and did appear to beneifit the most from vocal adduction exercises with "eee" produciton with varying pitch and volume intensity.    Function:  Eating Eating                 Cognition Comprehension Comprehension assist level: Understands complex 90% of the time/cues 10% of the time  Expression   Expression assist level: Expresses basic 50 - 74% of the time/requires cueing 25 - 49% of the time. Needs to repeat parts of sentences.  Social Interaction Social Interaction assist level: Interacts appropriately 90% of the time - Needs monitoring or encouragement for participation or interaction.  Problem Solving Problem solving assist level: Solves basic 90% of the time/requires cueing < 10% of the time  Memory Memory assist level: Recognizes or recalls 90% of the time/requires cueing < 10% of the time    Pain Pain Assessment Pain Assessment: No/denies pain  Therapy/Group: Individual Therapy  Pablo Lawrencereston, Hallis Meditz Tarrell 10/10/2015, 2:29 PM  Angela NevinJohn T. Francelia Mclaren, MA, CCC-SLP 10/10/2015 2:30 PM

## 2015-10-11 ENCOUNTER — Inpatient Hospital Stay (HOSPITAL_COMMUNITY): Payer: Medicare Other | Admitting: Occupational Therapy

## 2015-10-11 ENCOUNTER — Inpatient Hospital Stay (HOSPITAL_COMMUNITY): Payer: Medicare Other | Admitting: Physical Therapy

## 2015-10-11 ENCOUNTER — Inpatient Hospital Stay (HOSPITAL_COMMUNITY): Payer: Medicare Other | Admitting: Speech Pathology

## 2015-10-11 NOTE — Progress Notes (Signed)
Speech Language Pathology Daily Session Note  Patient Details  Name: Devesh Lee Goding MRN: [id number] Date of Birth: Jan 07, 1946  Today's Date: 10/11/2015 SLP Individual Time: [id number] SLP Individual Time Calculation (min): 46 min  Short Term Goals: Week 1: SLP Short Term Goal 1 (Week 1): Patient will consume current diet with minimal overt s/s of aspiration with supervision verbal cues for use of small bites/sips via cup.  SLP Short Term Goal 2 (Week 1): Patient will utilize speech intelligibility strategies at the phrase level with Min A verbal cues to self-monitor and correct errors.  SLP Short Term Goal 3 (Week 1): Patient will utilize diaphragmatic breathing at the word level with Min A verbal cues for accuracy and to self-monitor and correct errors.  SLP Short Term Goal 4 (Week 1): Patient will produce words with bilabial consonants in the first, medial and final position with 90% intelligibility with Min A verbal cues to self-monitor and correct errors.   Skilled Therapeutic Interventions:  Pt was seen for skilled ST targeting communication goals.  SLP facilitated the session with a verbal description task targeting speech intelligibility at the phrase level.  Pt was ~75% intelligible in phrases during the abovementioned task with mod faded to min assist verbal cues to correct verbal errors.  Pt was returned to room and transferred back to bed with +2 assist from RN.  Call bell left within reach.   Function:  Eating Eating                 Cognition Comprehension Comprehension assist level: Understands complex 90% of the time/cues 10% of the time  Expression   Expression assist level: Expresses basic 50 - 74% of the time/requires cueing 25 - 49% of the time. Needs to repeat parts of sentences.  Social Interaction Social Interaction assist level: Interacts appropriately 75 - 89% of the time - Needs redirection for appropriate language or to initiate interaction.  Problem  Solving Problem solving assist level: Solves basic 75 - 89% of the time/requires cueing 10 - 24% of the time  Memory Memory assist level: Recognizes or recalls 75 - 89% of the time/requires cueing 10 - 24% of the time    Pain Pain Assessment Pain Assessment: No/denies pain  Therapy/Group: Individual Therapy  Ladeja Pelham, Melanee SpryNicole L 10/11/2015, 3:07 PM

## 2015-10-11 NOTE — Progress Notes (Signed)
  [place] PHYSICAL MEDICINE & REHABILITATION     PROGRESS NOTE    Subjective/Complaints: Patient has no complaints. He is wondering why he has an IV. No other complaints.   Objective: Vital Signs: Blood pressure 141/74, pulse 73, temperature 98.4 F (36.9 C), temperature source Oral, resp. rate 18, SpO2 98 %.  Physical Exam:  BP 141/74 mmHg  Pulse 73  Temp(Src) 98.4 F (36.9 C) (Oral)  Resp 18  SpO2 98%  Elderly male in no acute distress. Chest clear to auscultation. Cardiac exam S1 and S2 are regular. Abdominal exam bowel sounds, soft. Extremities no edema.  Assessment/Plan: 1. 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 DVT Prophylaxis/Anticoagulation: Mechanical: Sequential compression devices, below knee Bilateral lower extremities 3. Pain Management: Patient denies pain. 4. Mood: LCSW to follow for evaluation and support.  5. Neuropsych: This patient is capable of making decisions on hid own behalf. 6. Skin/Wound Care: Routine pressure relief measures.  7. Fluids/Electrolytes/Nutrition: Monitor I/O.   Electrolytes WNL on 12/22 8. HTN: Monitor BP tid with permissive HTN and gradual normalization over next 5 days. Continue norvasc and coreg daily.  -Will increase Norvasc to 10 MG on 12/24 133/63-162/83 9. Dyslipidemia: Cotinue Lipitor daily.   LOS (Days) 3 A FACE TO FACE EVALUATION WAS PERFORMED  [facility]urgcenter GilbertWORDS,Ailynn Gow HENRY 10/11/2015 9:16 AM

## 2015-10-11 NOTE — Progress Notes (Signed)
Physical Therapy Session Note  Patient Details  Name: Sean Wilkerson MRN: [id number] Date of Birth: October 16, 1946  Today's Date: 10/11/2015 PT Concurrent Time: 1030-1200 PT Concurrent Time Calculation (min): 90 min  Short Term Goals: Week 1:  PT Short Term Goal 1 (Week 1): Patient will transfer bed <> wheelchair with assist of one person. PT Short Term Goal 2 (Week 1): Patient will perform sit <> stand with consistent mod A.  PT Short Term Goal 3 (Week 1): Patient will ambulate 25 ft with assist of one person. PT Short Term Goal 4 (Week 1): Patient will perform stair negotiation with assist of 2.  PT Short Term Goal 5 (Week 1): Patient will propel wheelchair via L hemi technique x 50 ft with supervision.      Therapy Documentation Precautions:  Precautions Precautions: Fall Restrictions Weight Bearing Restrictions: No   Mat Mobility:  Supine to and from short sit edge of mat with moderate assistance RLE management and trunk control.   In supine PNF D2 flexion with RLE  Bridging 3x10 with approximation of RLE.  Transfers: Sit to and from stand transfer with moderate assist and left hemiwalker; verbal cues for foot placement, even weight distribution and controlled descent.   Squat pivot transfer with moderate assist: verbal cues for sequencing, hand placement and controlled descent.   Block practice sit to and from stand transfer 5x moderate assistance.   Pre-gait activities: Patient performed rapid stepping for approximately 90 seconds with LLE max assist with left hemiwalker in order to increase weight bearing of RLE for strengthening and initiation of protective stepping strategies.  Approximation applied at right knee to prevent buckling.   Patient ambulated 25 feet, 12 feet and 20 feet with left hemiwalker and max assistx1; +2 for wheelchair follow and safety.  Patient ambulated with a step to pattern. Patient required total assist for advancement, placement and  stabilization of RLE. Patient able to advance hemiwalker. Patient Verbal cues required throughout. Educated on proper sequence and technique.  Patient negotiated 1step and 2 steps with use of left handrail max assist. Patient descended stairs backwards. Patient Verbal cues required throughout. Educated on proper sequence and technique.  Patient tolerated treatment well. Vitals monitored and remained stable throughout session responding appropriately to activity. Patient was without pain during session. Patient tolerated session with rest breaks throughout. Patient returned to room at end of session with all needs met resting comfortably in chair at bedside.  Call bell within reach and patient educated not to be up without assistance. Patient verbalized understanding.   See Function Navigator for Current Functional Status.   Therapy/Group: Concurrent  Retta Diones 10/11/2015, 12:40 PM

## 2015-10-11 NOTE — Progress Notes (Signed)
Occupational Therapy Session Note  Patient Details  Name: Sean BirchwoodLarry Lee Perl MRN: [id number] Date of Birth: 1946-08-24  Today's Date: 10/11/2015 OT Individual Time:  -   0900-1000  (60 min)      Short Term Goals: Week 1:  OT Short Term Goal 1 (Week 1): Pt will dress UB with mod A using hemi technique OT Short Term Goal 2 (Week 1): Pt will complete toilet transfer with max A +1 OT Short Term Goal 3 (Week 1): Pt will display carry over of hemi dressing technique with min VCs OT Short Term Goal 4 (Week 1): Pt will maintain static standing balance with min A in prep for LB dressing task OT Short Term Goal 5 (Week 1): Pt will utilize L UE during functional task with max A      Skilled Therapeutic Interventions/Progress Updates:    Pt exhibited impulsivity with transitional movements but responds to commands appropriately.Addressed  activity tolerance, bed mobility, functional transfers, sit<>stand, standing balance, hemi bathing and dressing techniques, and safety awareness to increase independence with BADLs. Pt performed UB bathing and dressing in unsupportive sitting.  He attempted to laterally lean to wash bottom but ended up in supine.  Pt. Rolled to right with min assist and max assist to left.  Went from supine to sit with mod assist.  Transferred to wc with squat pivot with mod assist to left side.  Second person for safety only.  Left pt in wc witth all needs in reach.    Therapy Documentation Precautions:  Precautions Precautions: Fall Restrictions Weight Bearing Restrictions: No    Vital Signs: Therapy Vitals Temp: 98.4 F (36.9 C) Temp Source: Oral Pulse Rate: 73 Resp: 18 BP: (!) 141/74 mmHg Patient Position (if appropriate): Lying Pain:  none             See Function Navigator for Current Functional Status.   Therapy/Group: Individual Therapy  Humberto Sealsdwards, Jannetta Massey J 10/11/2015, 9:09 AM

## 2015-10-12 NOTE — Progress Notes (Signed)
  [place] PHYSICAL MEDICINE & REHABILITATION     PROGRESS NOTE    Subjective/Complaints: Feels well. No complaints. Eating breakfast this morning.   Objective: Vital Signs: Blood pressure 168/70, pulse 68, temperature 98.6 F (37 C), temperature source Oral, resp. rate 16, SpO2 100 %.   Elderly male in no acute distress. Chest clear to auscultation. Cardiac exam S1 and S2 are regular. Abdominal exam bowel sounds, soft. Extremities no edema.  Assessment/Plan: 1. 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 DVT Prophylaxis/Anticoagulation: Mechanical: Sequential compression devices, below knee Bilateral lower extremities 3. Pain Management: Patient denies pain. 4. Mood: LCSW to follow for evaluation and support.  5. Neuropsych: This patient is capable of making decisions on hid own behalf. 6. Skin/Wound Care: Routine pressure relief measures.  7. Fluids/Electrolytes/Nutrition: Monitor I/O.   Electrolytes WNL on 12/22 8. HTN: Monitor BP tid with permissive HTN and gradual normalization over next 5 days. Continue norvasc and coreg daily.  - Norvasc to 10 MG on 12/24 133/76-168/70 9. Dyslipidemia: Cotinue Lipitor daily.   LOS (Days) 4 A FACE TO FACE EVALUATION WAS PERFORMED  SWORDS,BRUCE HENRY 10/12/2015 9:08 AM

## 2015-10-13 ENCOUNTER — Inpatient Hospital Stay (HOSPITAL_COMMUNITY): Payer: Medicare Other | Admitting: Physical Therapy

## 2015-10-13 ENCOUNTER — Inpatient Hospital Stay (HOSPITAL_COMMUNITY): Payer: Medicare Other

## 2015-10-13 ENCOUNTER — Inpatient Hospital Stay (HOSPITAL_COMMUNITY): Payer: Medicare Other | Admitting: Speech Pathology

## 2015-10-13 MED ORDER — CARVEDILOL 12.5 MG PO TABS
12.5000 mg | ORAL_TABLET | Freq: Two times a day (BID) | ORAL | Status: DC
  Administered 2015-10-13 – 2015-10-19 (×13): 12.5 mg via ORAL
  Filled 2015-10-13 (×13): qty 1

## 2015-10-13 NOTE — Progress Notes (Signed)
Speech Language Pathology Daily Session Note  Patient Details  Name: Sean BirchwoodLarry Lee Wilkerson MRN: [id number] Date of Birth: 1945-11-15  Today's Date: 10/13/2015 SLP Individual Time: 1105-1200 SLP Individual Time Calculation (min): 55 min  Short Term Goals: Week 1: SLP Short Term Goal 1 (Week 1): Patient will consume current diet with minimal overt s/s of aspiration with supervision verbal cues for use of small bites/sips via cup.  SLP Short Term Goal 2 (Week 1): Patient will utilize speech intelligibility strategies at the phrase level with Min A verbal cues to self-monitor and correct errors.  SLP Short Term Goal 3 (Week 1): Patient will utilize diaphragmatic breathing at the word level with Min A verbal cues for accuracy and to self-monitor and correct errors.  SLP Short Term Goal 4 (Week 1): Patient will produce words with bilabial consonants in the first, medial and final position with 90% intelligibility with Min A verbal cues to self-monitor and correct errors.   Skilled Therapeutic Interventions: Skilled treatment session focused on speech and dysphagia goals. SLP facilitated session by providing Min A verbal cues for use of speech intelligibility strategies at the phrase level during a novel, verbal task. Patient was ~90% intelligible to SLP, however, with an unfamiliar communication partner, patient was ~50% intelligible (sus[facility] due to [facility]  SLP also facilitated session by providing Min A verbal cues for use of small bites/sips with lunch meal of regular textures and thin liquids. Patient did not demonstrate any overt s/s of aspiration but required liquid washes to clear mild oral residue. Patient reported meal times were "slow" and requested his textures be downgraded to Dys. 3 in order to facilitate increased mastication and maximize energy conservation. Patient left upright in wheelchair with all needs within reach. Continue with current plan of care.     Function:  Eating Eating   Modified Consistency Diet: No Eating Assist Level: Set up assist for;Supervision or verbal cues   Eating Set Up Assist For: Opening containers;Cutting food       Cognition Comprehension Comprehension assist level: Understands complex 90% of the time/cues 10% of the time  Expression   Expression assist level: Expresses basic 75 - 89% of the time/requires cueing 10 - 24% of the time. Needs helper to occlude trach/needs to repeat words.  Social Interaction Social Interaction assist level: Interacts appropriately 75 - 89% of the time - Needs redirection for appropriate language or to initiate interaction.  Problem Solving Problem solving assist level: Solves basic 75 - 89% of the time/requires cueing 10 - 24% of the time  Memory Memory assist level: Recognizes or recalls 75 - 89% of the time/requires cueing 10 - 24% of the time    Pain Pain Assessment Pain Assessment: No/denies pain  Therapy/Group: Individual Therapy  Sean Wilkerson 10/13/2015, 12:42 PM

## 2015-10-13 NOTE — Progress Notes (Signed)
Physical Therapy Session Note  Patient Details  Name: Sean BirchwoodLarry Lee Esquer MRN: [id number] Date of Birth: 24-Mar-1946  Today's Date: 10/13/2015 PT Individual Time: [id number] PT Individual Time Calculation (min): 30 min   Short Term Goals: Week 1:  PT Short Term Goal 1 (Week 1): Patient will transfer bed <> wheelchair with assist of one person. PT Short Term Goal 2 (Week 1): Patient will perform sit <> stand with consistent mod A.  PT Short Term Goal 3 (Week 1): Patient will ambulate 25 ft with assist of one person. PT Short Term Goal 4 (Week 1): Patient will perform stair negotiation with assist of 2.  PT Short Term Goal 5 (Week 1): Patient will propel wheelchair via L hemi technique x 50 ft with supervision. Week 2:     Skilled Therapeutic Interventions/Progress Updates: w/c propulsion using hemi method.  W/c<> mat level transfer using slidebaord, with cues for head/hips relationship, foot placement and safety.  Therapeutic activities in sitting for midline orientation, = wt bearing bil hips, and dynamic sitting balance,: L and R lateral leans (r onto wedge with assistance), trunk flexion/extension .  Family returned pt to room.    Therapy Documentation Precautions:  Precautions Precautions: Fall Restrictions Weight Bearing Restrictions: No  Pain: Pain Assessment Pain Assessment: No/denies pain      See Function Navigator for Current Functional Status.   Therapy/Group: Individual Therapy  Wiliam Cauthorn 10/13/2015, 3:38 PM

## 2015-10-13 NOTE — Progress Notes (Signed)
Physical Therapy Note  Patient Details  Name: Sean Wilkerson MRN: [id number] Date of Birth: 1946/08/02 Today's Date: 10/13/2015    Time: 845-940 55 minutes  1:1 No c/o pain.  Pt propelled w/c using hemi technique 2 x 25' with supervision/min A. Pt with difficulty using L LE for steering, especially when fatigued. Squat pivot transfers with mod A. Repetition of squat pivot transfer multiple attempts in blocked practice with tactile cuing at glutes for contraction and manual facilitation at hips for wt shift. Pt improves with repetition.  Standing balance with reaching tasks and horseshoe game with tactile cues for quad contraction and manual facilitation for upright posture. Pt requires cues for smooth and controlled movement, improves with repetition.  Supine NMR with focus on R LE mm activation with resistance in PNFs, bridging, LTR.  Pt fatigues easily this session, requires frequent rest breaks.  Glena Pharris 10/13/2015, 9:35 AM

## 2015-10-13 NOTE — Progress Notes (Signed)
Occupational Therapy Session Note  Patient Details  Name: Sean BirchwoodLarry Lee Marchesi MRN: [id number] Date of Birth: [birth year]  Today's Date: 10/13/2015 OT Individual Time: 4098-11910745-0845 OT Individual Time Calculation (min): 60 min    Short Term Goals: Week 1:  OT Short Term Goal 1 (Week 1): Pt will dress UB with mod A using hemi technique OT Short Term Goal 2 (Week 1): Pt will complete toilet transfer with max A +1 OT Short Term Goal 3 (Week 1): Pt will display carry over of hemi dressing technique with min VCs OT Short Term Goal 4 (Week 1): Pt will maintain static standing balance with min A in prep for LB dressing task OT Short Term Goal 5 (Week 1): Pt will utilize L UE during functional task with max A  Skilled Therapeutic Interventions/Progress Updates:    Pt engaged in BADL retraining including bathing and dressing with sit<>stand from w/c at sink. Pt required max A for scoot transfer to w/c with max verbal cues to lean forward during transfer. Pt educated on hemi bathing and dressing techniques. Pt erquired hand over hand assist using RUE to assist with bathing. Pt required max A for sit<>stand and standing to bathe buttocks and pull up pants. Pt exhibited impulsivity with transitional movements but responds to commands appropriately. Pt's right knee required blocking when standing to prevent knee buckling. Focus on activity tolerance, bed mobility, functional transfers, sit<>stand, standing balance, hemi bathing and dressing techniques, and safety awareness to increase independence with BADLs.  Therapy Documentation Precautions:  Precautions Precautions: Fall Restrictions Weight Bearing Restrictions: No Pain:  Pt denied pain See Function Navigator for Current Functional Status.   Therapy/Group: Individual Therapy  Rich BraveLanier, Dannetta Lekas Chappell 10/13/2015, 8:51 AM

## 2015-10-13 NOTE — Progress Notes (Signed)
[place] PHYSICAL MEDICINE & REHABILITATION     PROGRESS NOTE    Subjective/Complaints: Seen and examined this morning lying in bed. Patient states that he had a good weekend and is ready to start again with therapies today  ROS: Denies CP, SOB, N/V/D   Objective: Vital Signs: Blood pressure 174/82, pulse 63, temperature 98.4 F (36.9 C), temperature source Oral, resp. rate 18, SpO2 100 %. No results found. No results for input(s): WBC, HGB, HCT, PLT in the last 72 hours. No results for input(s): NA, K, CL, GLUCOSE, BUN, CREATININE, CALCIUM in the last 72 hours.  Invalid input(s): CO CBG (last 3)  No results for input(s): GLUCAP in the last 72 hours.  Wt Readings from Last 3 Encounters:  10/05/15 115.5 kg (254 lb 10.1 oz)    Physical Exam:  BP 174/82 mmHg  Pulse 63  Temp(Src) 98.4 F (36.9 C) (Oral)  Resp 18  SpO2 100% Constitutional: He appears well-developed and well-nourished. NAD. Vital signs reviewed HENT: Normocephalic and atraumatic.  Mouth/Throat: Oropharynx is pink, clear and moist.  Eyes: Conjunctivae and EOM are normal. Right eye exhibits no discharge. Left eye exhibits no discharge.  Cardiovascular: Normal rate and regular rhythm.No murmur heard. Respiratory: Effort normal and breath sounds normal. No respiratory distress. He has no wheezes.  GI: Soft. Bowel sounds are normal. He exhibits no distension. There is no tenderness.  Musculoskeletal: He exhibits edema (RUE). He exhibits no tenderness.  Neurological: He is alert and oriented.  Right hemifacial weakness and tongue deviation to right.  Mild dysphonia with moderate dysarthria  Has good awareness and insight into deficits.  LUE/LLE: 5/5 proximal to distal RUE: 0/5 prox to distal. RLE: 1/5 HE and KE, distally 0/5.  Sensation diminished to light touch and right upper and right lower extremities. Skin: Skin is warm and dry.  Psychiatric: He has a normal mood and affect. His behavior is normal.  Judgment and thought content normal.   Assessment/Plan: 1. 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 Physiatrist is providing close team supervision and 24 hour management of active medical problems listed below. Physiatrist and rehab team continue to assess barriers to discharge/monitor patient progress toward functional and medical goals.  Function:  Bathing Bathing position   Position: Wheelchair/chair at sink  Bathing parts Body parts bathed by patient: Right arm, Chest, Abdomen, Front perineal area, Right upper leg, Left upper leg Body parts bathed by helper: Left arm, Buttocks, Right lower leg, Left lower leg, Back  Bathing assist Assist Level: 2 helpers      Upper Body Dressing/Undressing Upper body dressing   What is the patient wearing?: Pull over shirt/dress     Pull over shirt/dress - Perfomed by patient: Pull shirt over trunk, Thread/unthread left sleeve, Put head through opening Pull over shirt/dress - Perfomed by helper: Thread/unthread right sleeve        Upper body assist Assist Level: Touching or steadying assistance(Pt > 75%)      Lower Body Dressing/Undressing Lower body dressing   What is the patient wearing?: Pants, Socks, Shoes     Pants- Performed by patient: Thread/unthread left pants leg Pants- Performed by helper: Thread/unthread right pants leg, Thread/unthread left pants leg, Pull pants up/down       Socks - Performed by helper: Don/doff right sock, Don/doff left sock   Shoes - Performed by  helper: Don/doff right shoe, Don/doff left shoe, Fasten right, Fasten left          Lower body assist Assist for lower body dressing: 2 Helpers      Toileting Toileting Toileting activity did not occur: Safety/medical concerns   Toileting steps completed  by helper: Adjust clothing prior to toileting, Performs perineal hygiene, Adjust clothing after toileting    Toileting assist     Transfers Chair/bed transfer   Chair/bed transfer method: Stand pivot Chair/bed transfer assist level: Maximal assist (Pt 25 - 49%/lift and lower) Chair/bed transfer assistive device: Mechanical lift Mechanical lift: Stedy   Locomotion Ambulation     Max distance: 15 Assist level: 2 helpers   Wheelchair   Type: Manual Max wheelchair distance: 75 Assist Level: Moderate assistance (Pt 50 - 74%)  Cognition Comprehension Comprehension assist level: Understands complex 90% of the time/cues 10% of the time  Expression Expression assist level: Expresses basic 75 - 89% of the time/requires cueing 10 - 24% of the time. Needs helper to occlude trach/needs to repeat words.  Social Interaction Social Interaction assist level: Interacts appropriately 75 - 89% of the time - Needs redirection for appropriate language or to initiate interaction.  Problem Solving Problem solving assist level: Solves basic 75 - 89% of the time/requires cueing 10 - 24% of the time  Memory Memory assist level: Recognizes or recalls 75 - 89% of the time/requires cueing 10 - 24% of the time   Medical Problem List and Plan: 1. 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 DVT Prophylaxis/Anticoagulation: Mechanical: Sequential compression devices, below knee Bilateral lower extremities 3. Pain Management: Tylenol prn.  4. Mood: LCSW to follow for evaluation and support.  5. Neuropsych: This patient is capable of making decisions on hid own behalf. 6. Skin/Wound Care: Routine pressure relief measures.  7. Fluids/Electrolytes/Nutrition: Monitor I/O.   Electrolytes WNL on 12/22 8. HTN: Monitor BP tid with permissive HTN and gradual normalization over 5 days. Continue norvasc and coreg  daily.  -Norvasc increased to 10MG  on 12/24  -Coreg increased to 12.5 on 10/26  -orthostatic vs daily for now 9. Dyslipidemia: Cotinue Lipitor daily.   LOS (Days) 5 A FACE TO FACE EVALUATION WAS PERFORMED  Madell Heino Karis Jubanil Jediah Horger 10/13/2015 8:53 AM

## 2015-10-14 ENCOUNTER — Inpatient Hospital Stay (HOSPITAL_COMMUNITY): Payer: Medicare Other | Admitting: Speech Pathology

## 2015-10-14 ENCOUNTER — Inpatient Hospital Stay (HOSPITAL_COMMUNITY): Payer: Medicare Other | Admitting: Physical Therapy

## 2015-10-14 ENCOUNTER — Inpatient Hospital Stay (HOSPITAL_COMMUNITY): Payer: Medicare Other

## 2015-10-14 DIAGNOSIS — M792 Neuralgia and neuritis, unspecified: Secondary | ICD-10-CM

## 2015-10-14 MED ORDER — GABAPENTIN 600 MG PO TABS
300.0000 mg | ORAL_TABLET | Freq: Every day | ORAL | Status: DC
  Administered 2015-10-14: 300 mg via ORAL
  Filled 2015-10-14: qty 1

## 2015-10-14 NOTE — Care Management Note (Signed)
[facility]patient Rehabilitation Center Individual Statement of Services  Patient Name:  Adolphus BirchwoodLarry Lee Marchetti  Date:  10/14/2015  Welcome to the [facility]patient [facility].  Our goal is to provide you with an individualized program based on your diagnosis and situation, designed to meet your specific needs.  With this comprehensive rehabilitation program, you will be expected to participate in at least 3 hours of rehabilitation therapies Monday-Friday, with modified therapy programming on the weekends.  Your rehabilitation program will include the following services:  Physical Therapy (PT), Occupational Therapy (OT), Speech Therapy (ST), 24 hour per day rehabilitation nursing, Therapeutic Recreaction (TR), Neuropsychology, Case Management (Social Worker), Rehabilitation Medicine, Nutrition Services and Pharmacy Services  Weekly team conferences will be held on Tuesdays to discuss your progress.  Your Social Worker will talk with you frequently to get your input and to update you on team discussions.  Team conferences with you and your family in attendance may also be held.  Expected length of stay: 3-4 weeks  Overall anticipated outcome: minimal assistance  Depending on your progress and recovery, your program may change. Your Social Worker will coordinate services and will keep you informed of any changes. Your Social Worker's name and contact numbers are listed  below.  The following services may also be recommended but are not provided by the Inpatient Rehabilitation Center:   Driving Evaluations  Home Health Rehabiltiation Services  Outpatient Rehabilitation Services  Vocational Rehabilitation   Arrangements will be made to provide these services after discharge if needed.  Arrangements include referral to agencies that provide these services.  Your insurance has been verified to be:  [facility] [place] Your primary doctor is:  None (Child psychotherapistsocial worker will assist with confirming a primary care  MD)  Pertinent information will be shared with your doctor and your insurance company.  Social Worker:  [place], [place] [phone] or ([phone]) [phone]   Information discussed with and copy given to patient by: Amada JupiterHOYLE, Roderic Lammert, 10/14/2015, 9:55 AM

## 2015-10-14 NOTE — Progress Notes (Signed)
Occupational Therapy Note  Patient Details  Name: Sean Wilkerson MRN: [id number] Date of Birth: 1946/09/06  Today's Date: 10/14/2015 OT Individual Time: 1330-1413 OT Individual Time Calculation (min): 43 min   Pt c/o 3/10 pain in right shoulder; repositioned Individual therapy  Pt resting in w/c upon arrival.  Pt transitioned to gym and transferred to mat with scoot/squat pivot transfer at max A with +1 to hold w/c.  Pt engaged in RUE weight bearing and facilitated shoulder flexion/extension activities.  Pt noted with trace shoulder flexion.  Pt attempted to activate scapula elevation.  Pt returned to room and remained in w/c with all needs within reach and half lap trace in place.   Lavone NeriLanier, Sean Wilkerson [facility] 10/14/2015, 2:37 PM

## 2015-10-14 NOTE — Progress Notes (Signed)
Physical Therapy Session Note  Patient Details  Name: Sean Wilkerson MRN: [id number] Date of Birth: 1945-11-19  Today's Date: 10/14/2015 PT Individual Time: 1100-1200 PT Individual Time Calculation (min): 60 min   Short Term Goals: Week 1:  PT Short Term Goal 1 (Week 1): Patient will transfer bed <> wheelchair with assist of one person. PT Short Term Goal 2 (Week 1): Patient will perform sit <> stand with consistent mod A.  PT Short Term Goal 3 (Week 1): Patient will ambulate 25 ft with assist of one person. PT Short Term Goal 4 (Week 1): Patient will perform stair negotiation with assist of 2.  PT Short Term Goal 5 (Week 1): Patient will propel wheelchair via L hemi technique x 50 ft with supervision.  Transfers: Sit to and from stand transfer with moderate assist and left hemiwalker; verbal cues for hand and foot placement, even weight distribution and controlled descent.   Squat pivot transfer with minimum to moderate assist. Block practice squat pivot transfer to and from wheelchair and mat 6x.      Pre-gait activities: Patient performed stepping on 6 inch step for two trials of approximately 2 minutes each with LLE max assist with left hemiwalker in order to increase weight bearing of RLE for strengthening. Approximation applied at right knee to prevent buckling.   Patient ambulated 15 feet with left hemiwalker and max assistx1; +2 for wheelchair follow and safety. Patient ambulated with a step to pattern. Patient required total assist for advancement, placement and stabilization of RLE. Patient able to advance hemiwalker. Patient Verbal cues required throughout. Educated on proper sequence and technique.  Patient stood three trials of 4 minutes, 2 minutes and 4 minutes with and without left hemiwalker moderate assistance. Utilization of mirror for visual feedback and to promote upright posture, positioning in midline and even weight distribution.  Patient propelled  wheelchair 100 feet with close supervision assistance and verbal cues for proper sequence and technique. Min assist required tight spaces and obstacle negotiation.   Patient tolerated treatment well. Vitals monitored and remained stable throughout session responding appropriately to activity. Patient reports occasional pain in right shoulder no pain reported during session. Patient tolerated session with rest breaks throughout. Patient returned to room at end of session with all needs met resting comfortably in chair at bedside. Call bell within reach and patient educated not to be up without assistance. Patient verbalized understanding.    See Function Navigator for Current Functional Status.   Therapy/Group: Individual Therapy  Retta Diones 10/14/2015, 12:46 PM

## 2015-10-14 NOTE — Progress Notes (Signed)
Speech Language Pathology Daily Session Note  Patient Details  Name: Sean Wilkerson MRN: [id number] Date of Birth: 30-Nov-1945  Today's Date: 10/14/2015 SLP Individual Time: 0730-0830 SLP Individual Time Calculation (min): 60 min  Short Term Goals: Week 1: SLP Short Term Goal 1 (Week 1): Patient will consume current diet with minimal overt s/s of aspiration with supervision verbal cues for use of small bites/sips via cup.  SLP Short Term Goal 2 (Week 1): Patient will utilize speech intelligibility strategies at the phrase level with Min A verbal cues to self-monitor and correct errors.  SLP Short Term Goal 3 (Week 1): Patient will utilize diaphragmatic breathing at the word level with Min A verbal cues for accuracy and to self-monitor and correct errors.  SLP Short Term Goal 4 (Week 1): Patient will produce words with bilabial consonants in the first, medial and final position with 90% intelligibility with Min A verbal cues to self-monitor and correct errors.   Skilled Therapeutic Interventions: Skilled treatment session focused on dysphagia and speech goals. SLP facilitated session by providing skilled observation with breakfast meal of Dys. 3 textures with thin liquids via cup. Patient consumed meal without overt s/s of aspiration and demonstrated timelier mastication compared to previous meals with regular textures. Recommend patient continue current diet of Dys. 3 textures. SLP also facilitated session by providing Min A verbal cues for use pacing, breath support and over-articulation at the word level to maximize intelligibility to 90% while producing bilabial consonants in the first and medial position.  Intelligibility decreased during functional conversation due to an increased speech rate. Patient left supine in bed with all needs within reach and alarm on. Continue with current plan of care.    Function:  Eating Eating   Modified Consistency Diet: Yes Eating Assist Level: Set up  assist for;Supervision or verbal cues   Eating Set Up Assist For: Opening containers       Cognition Comprehension Comprehension assist level: Understands complex 90% of the time/cues 10% of the time  Expression   Expression assist level: Expresses basic 75 - 89% of the time/requires cueing 10 - 24% of the time. Needs helper to occlude trach/needs to repeat words.  Social Interaction Social Interaction assist level: Interacts appropriately 90% of the time - Needs monitoring or encouragement for participation or interaction.  Problem Solving Problem solving assist level: Solves basic 90% of the time/requires cueing < 10% of the time  Memory Memory assist level: Recognizes or recalls 75 - 89% of the time/requires cueing 10 - 24% of the time    Pain Pain Assessment Pain Assessment: No/denies pain  Therapy/Group: Individual Therapy  Sean Wilkerson 10/14/2015, 8:36 AM

## 2015-10-14 NOTE — Progress Notes (Signed)
[facility]IC[facility] RE[facility]ABILITATION     PROGRESS NOTE    Subjective/Complaints: Pt seen this morning resting in bed.  He had trouble sleeping last night due to neuropathic pain in his RLE.    ROS: +RLE pain. Denies CP, SOB, N/V/D   Objective: Vital Signs: Blood pressure 144/67, pulse 64, temperature 98.3 F (36.8 C), temperature source Oral, resp. rate 18, SpO2 100 %. No results found. No results for input(s): WBC, HGB, HCT, PLT in the last 72 hours. No results for input(s): NA, K, CL, GLUCOSE, BUN, CREATININE, CALCIUM in the last 72 hours.  Invalid input(s): CO CBG (last 3)  No results for input(s): GLUCAP in the last 72 hours.  Wt Readings from Last 3 Encounters:  10/05/15 115.5 kg (254 lb 10.1 oz)    Physical Exam:  BP 144/67 mmHg  Pulse 64  Temp(Src) 98.3 F (36.8 C) (Oral)  Resp 18  SpO2 100% Constitutional: He appears well-developed and well-nourished. NAD. Vital signs reviewed HENT: Normocephalic and atraumatic.  Mouth/Throat: Oropharynx is pink, clear and moist.  Eyes: Conjunctivae and EOM are normal. Right eye exhibits no discharge. Left eye exhibits no discharge.  Cardiovascular: Normal rate and regular rhythm.No murmur heard. Respiratory: Effort normal and breath sounds normal. No respiratory distress. He has no wheezes.  GI: Soft. Bowel sounds are normal. He exhibits no distension. There is no tenderness.  Musculoskeletal: He exhibits edema (RUE). He exhibits no tenderness.  Neurological: He is alert and oriented.  Right hemifacial weakness.  Mild dysphonia with moderate dysarthria  Has good awareness and insight into deficits.  LUE/LLE: 5/5 proximal to distal RUE: 0/5 prox to distal. RLE: 1/5 HE and KE, distally 0/5.  Sensation diminished to light touch and right upper and right lower extremities. Skin: Skin is warm and dry.  Psychiatric: He has a normal mood and affect. His behavior is normal. Judgment and thought content normal.    Assessment/Plan: 1. 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 Physiatrist is providing close team supervision and 24 hour management of active medical problems listed below. Physiatrist and rehab team continue to assess barriers to discharge/monitor patient progress toward functional and medical goals.  Function:  Bathing Bathing position   Position: Wheelchair/chair at sink  Bathing parts Body parts bathed by patient: Right arm, Chest, Abdomen, Front perineal area, Right upper leg, Left upper leg Body parts bathed by helper: Left arm, Buttocks, Right lower leg, Left lower leg, Back  Bathing assist Assist Level: 2 helpers      Upper Body Dressing/Undressing Upper body dressing   What is the patient wearing?: Pull over shirt/dress     Pull over shirt/dress - Perfomed by patient: Pull shirt over trunk, Thread/unthread left sleeve, Put head through opening Pull over shirt/dress - Perfomed by helper: Thread/unthread right sleeve        Upper body assist Assist Level: Touching or steadying assistance(Pt > 75%)      Lower Body Dressing/Undressing Lower body dressing   What is the patient wearing?: Pants, Socks, Shoes     Pants- Performed by patient: Thread/unthread left pants leg Pants- Performed by helper: Thread/unthread right pants leg, Thread/unthread left pants leg, Pull pants up/down       Socks - Performed by helper: Don/doff right sock, Don/doff left sock   Shoes - Performed by helper: Don/doff right shoe, Don/doff  left shoe, Fasten right, Fasten left          Lower body assist Assist for lower body dressing: 2 Helpers      Toileting Toileting Toileting activity did not occur: Safety/medical concerns   Toileting steps completed by helper: Adjust clothing prior to  toileting, Performs perineal hygiene, Adjust clothing after toileting    Toileting assist     Transfers Chair/bed transfer   Chair/bed transfer method: Stand pivot Chair/bed transfer assist level: Moderate assist (Pt 50 - 74%/lift or lower) Chair/bed transfer assistive device: Sliding board Mechanical lift: Landscape architecttedy   Locomotion Ambulation     Max distance: 15 Assist level: 2 helpers   Wheelchair   Type: Manual Max wheelchair distance: 100 Assist Level: Touching or steadying assistance (Pt > 75%)  Cognition Comprehension Comprehension assist level: Understands complex 90% of the time/cues 10% of the time  Expression Expression assist level: Expresses basic 75 - 89% of the time/requires cueing 10 - 24% of the time. Needs helper to occlude trach/needs to repeat words.  Social Interaction Social Interaction assist level: Interacts appropriately 90% of the time - Needs monitoring or encouragement for participation or interaction.  Problem Solving Problem solving assist level: Solves basic 90% of the time/requires cueing < 10% of the time  Memory Memory assist level: Recognizes or recalls 75 - 89% of the time/requires cueing 10 - 24% of the time   Medical Problem List and Plan: 1. 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 DVT Prophylaxis/Anticoagulation: Mechanical: Sequential compression devices, below knee Bilateral lower extremities 3. Pain Management: Tylenol prn.  4. Mood: LCSW to follow for evaluation and support.  5. Neuropsych: This patient is capable of making decisions on hid own behalf. 6. Skin/Wound Care: Routine pressure relief measures.  7. Fluids/Electrolytes/Nutrition: Monitor I/O.   Electrolytes WNL on 12/22 8. HTN: Monitor BP tid with permissive HTN and gradual normalization over 5 days. Continue norvasc and coreg daily.  -Norvasc increased to 10MG  on  12/24  -Coreg increased to 12.5 on 10/26, continues to be elevated, will cont to monitor today and consider further increase tomorrow 9. Dyslipidemia: Cotinue Lipitor daily.  10. Neuropathic pain: Will start pt on Gabapentin 300 qhs  LOS (Days) 6 A FACE TO FACE EVALUATION WAS PERFORMED  Alani Lacivita Karis Jubanil Tamarra Geiselman 10/14/2015 9:09 AM

## 2015-10-14 NOTE — Progress Notes (Signed)
Social Work  Social Work Assessment and Plan  Patient Details  Name: Sean Wilkerson MRN: [id number] Date of Birth: 01/28/1946  Today's Date: 10/10/2015  Problem List:  Patient Active Problem List   Diagnosis Date Noted  . Neuropathic pain   . Stroke, acute, thrombotic (HCC) 10/08/2015  . 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 Essential hypertension 10/08/2015  . HLD (hyperlipidemia) 10/08/2015  . Hemiplegia and hemiparesis following cerebral infarction affecting right dominant side (HCC)   . Right hemiplegia (HCC) 10/06/2015  . Stroke (cerebrum) (HCC) 10/05/2015  . CVA (cerebral infarction) 10/05/2015   Past Medical History:  Past Medical History  Diagnosis Date  . Hypertension   . HTN (hypertension) 10/08/2015  . HLD (hyperlipidemia) 10/08/2015   Past Surgical History:  Past Surgical History  Procedure Laterality Date  . No past surgeries     Social History:  reports that he has never smoked. He does not have any smokeless tobacco history on file. He reports that he does not drink alcohol or use illicit drugs.  Family / Support Systems Marital Status: Married Patient Roles: Spouse, Parent Spouse/Significant Other: wife, Andersen Iorio @ (C) [phone] Children: two adult daughter, Selena Batten and Herbert Seta Anticipated Caregiver: Wife will be primary caregiver and understands that pt. will need 24 hour assist upon DC from rehab.  She states she will begin to work on arranging this care. Ability/Limitations of Caregiver: wife works part time Medical laboratory scientific officer: Intermittent Family Dynamics: All family in room during assessment and wife assists pt with communication.  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 Legal Hisotry/Current Legal Issues: NA Guardian/Conservator: None - per MD, pt is capable of making decisions on his own behalf.   Abuse/Neglect Physical Abuse: Denies Verbal Abuse: Denies Sexual Abuse: Denies Exploitation of patient/patient's resources: Denies Self-Neglect: Denies  Emotional Status Pt's affect, behavior adn adjustment status: Pt with communicatio/ speech difficulties, however, he makes attempts to answer as he is able.  Smiling at grandaughters in the room.  He denies any significant emotional distress, however, will follow up on this as family was present.  Will refer to neuropsychology for adjustment screening. Recent Psychosocial Issues: None Pyschiatric History: None Substance Abuse History: None  Patient / Family Perceptions, Expectations & Goals Pt/Family understanding of illness & functional limitations: Pt and wife appear to have a good understanding of his stroke and of his current functional limitations/ need for CIR. Premorbid pt/family roles/activities: Pt was working full-time as a Education officer, environmental.  Wife working p/t as well and both highly independent. Anticipated changes in roles/activities/participation: Pt will requires some caregiver assistance per tx goals.  Wife to assume primary caregiver role. Pt/family expectations/goals: "I want to be able to do for myself."  [employer]: None Premorbid Home Care/DME Agencies: None Transportation available at discharge: yes Resource referrals recommended: Neuropsychology, Support group (specify)  Discharge Planning Living Arrangements: Spouse/significant other Support Systems: Spouse/significant other, Children, Friends/neighbors, Psychologist, clinical community Type of Residence: Private residence Insurance Resources: Electrical engineer Resources: Tree surgeon, Employment Financial Screen Referred: No Living  Expenses: Database administrator Management: Spouse Does the patient have any problems obtaining your medications?: No Home Management: wife Patient/Family Preliminary Plans: Pt to return home with wife  as primary support. Social Work Anticipated Follow Up Needs: HH/OP Expected length of stay: 20-25 days  Clinical Impression Very pleasant gentleman here following stroke.  Family at bedside and very supportive and encouraging.  Pt has excellent support from his church as well.  He denies any s/s of emotional distress.  Strong faith.  Will follow for support and d/c planning needs.  Dodd Schmid 10/10/2015, 9:52 AM

## 2015-10-14 NOTE — Progress Notes (Signed)
Occupational Therapy Session Note  Patient Details  Name: Sean Wilkerson MRN: [id number] Date of Birth: 11/09/1945  Today's Date: 10/14/2015 OT Individual Time: 1000-1100 OT Individual Time Calculation (min): 60 min    Short Term Goals: Week 1:  OT Short Term Goal 1 (Week 1): Pt will dress UB with mod A using hemi technique OT Short Term Goal 2 (Week 1): Pt will complete toilet transfer with max A +1 OT Short Term Goal 3 (Week 1): Pt will display carry over of hemi dressing technique with min VCs OT Short Term Goal 4 (Week 1): Pt will maintain static standing balance with min A in prep for LB dressing task OT Short Term Goal 5 (Week 1): Pt will utilize L UE during functional task with max A  Skilled Therapeutic Interventions/Progress Updates:    Pt resting in bed upon arrival.  [facility]  Pt is able to perform sit<>stand with Stedy with min A and maintain standing for hygiene and pulling up pants.  Pt engaged in BADL retraining including bathing and dressing with sit<>stand from w/c at sink.  Pt continues to require hand over hand assist to engage RUE in functional tasks.  Reinforced hemi dressing and bathing strategies.  Pt requires min verbal cues for sequencing and positioning with sit<>stand and functional transfers. Focus on activity tolerance, sit<>stand with Stedy, standing balance with Stedy, hemi bathing/dressing techniques, attention to right, and safety awareness.   Therapy Documentation Precautions:  Precautions Precautions: Fall Restrictions Weight Bearing Restrictions: No Pain: Pain Assessment Pain Assessment: No/denies pain  See Function Navigator for Current Functional Status.   Therapy/Group: Individual Therapy  Rich BraveLanier, Ivyrose Hashman Chappell 10/14/2015, 11:14 AM

## 2015-10-15 ENCOUNTER — Inpatient Hospital Stay (HOSPITAL_COMMUNITY): Payer: Medicare Other | Admitting: Speech Pathology

## 2015-10-15 ENCOUNTER — Inpatient Hospital Stay (HOSPITAL_COMMUNITY): Payer: Medicare Other | Admitting: *Deleted

## 2015-10-15 ENCOUNTER — Inpatient Hospital Stay (HOSPITAL_COMMUNITY): Payer: Medicare Other | Admitting: Occupational Therapy

## 2015-10-15 ENCOUNTER — Inpatient Hospital Stay (HOSPITAL_COMMUNITY): Payer: Medicare Other | Admitting: Physical Therapy

## 2015-10-15 MED ORDER — GABAPENTIN 600 MG PO TABS
300.0000 mg | ORAL_TABLET | Freq: Three times a day (TID) | ORAL | Status: DC
  Administered 2015-10-15 – 2015-10-16 (×5): 300 mg via ORAL
  Filled 2015-10-15 (×5): qty 1

## 2015-10-15 NOTE — Progress Notes (Signed)
Occupational Therapy Session Note  Patient Details  Name: Sean BirchwoodLarry Lee Umana MRN: [id number] Date of Birth: [birth year]  Today's Date: 10/15/2015 OT Individual Time: 1130-1157 OT Individual Time Calculation (min): 27 min    Short Term Goals: Week 1:  OT Short Term Goal 1 (Week 1): Pt will dress UB with mod A using hemi technique OT Short Term Goal 2 (Week 1): Pt will complete toilet transfer with max A +1 OT Short Term Goal 3 (Week 1): Pt will display carry over of hemi dressing technique with min VCs OT Short Term Goal 4 (Week 1): Pt will maintain static standing balance with min A in prep for LB dressing task OT Short Term Goal 5 (Week 1): Pt will utilize L UE during functional task with max A  Skilled Therapeutic Interventions/Progress Updates:    1:1 NMES applied to supraspinatus and middle deltoid to help approximate shoulder joint to reduce sublux and reduce pain.  Ratio: 1:1 Rate 35 pps Waveform - Asymmetric Ramp 1.0 Pulse 300 Intensity - 23 Duration - 10 mins  Pt reports pain of 3/10 in Rt shoulder with shoulder flexion.  Engaged in PROM with shoulder flexion/extension, internal/external rotation, and abduction/adduction during NMES. Pt tolerated NMES with no adverse reactions and no skin irritation.  Pt reports pain 0/10 at end of session.    Therapy Documentation Precautions:  Precautions Precautions: Fall Restrictions Weight Bearing Restrictions: No General:   Vital Signs: Therapy Vitals Temp: 98.4 F (36.9 C) Temp Source: Oral Pulse Rate: 76 Resp: 18 BP: (!) 151/71 mmHg Patient Position (if appropriate): Sitting Oxygen Therapy SpO2: 99 % O2 Device: Not Delivered Pain:  Pt with c/o pain 3/10 in Rt shoulder with movement  See Function Navigator for Current Functional Status.   Therapy/Group: Individual Therapy  Rosalio LoudHOXIE, Gabryella Murfin 10/15/2015, 2:04 PM

## 2015-10-15 NOTE — Progress Notes (Signed)
Occupational Therapy Session Note  Patient Details  Name: Sean Wilkerson MRN: [id number] Date of Birth: 05/08/1946  Today's Date: 10/15/2015 OT Individual Time: [id number] OT Individual Time Calculation (min): 57 min    Short Term Goals: Week 1:  OT Short Term Goal 1 (Week 1): Pt will dress UB with mod A using hemi technique OT Short Term Goal 2 (Week 1): Pt will complete toilet transfer with max A +1 OT Short Term Goal 3 (Week 1): Pt will display carry over of hemi dressing technique with min VCs OT Short Term Goal 4 (Week 1): Pt will maintain static standing balance with min A in prep for LB dressing task OT Short Term Goal 5 (Week 1): Pt will utilize L UE during functional task with max A  Skilled Therapeutic Interventions/Progress Updates:    Pt resting in bed upon arrival.  Pt engaged in BADL retraining including bathing and dressing with sit<>stand from w/c at sink.  Pt performed scoot transfer to w/c with max A and mod multimodal cues for positioning and sequencing.  Pt continues to required mod multimodal cues for appropriate technique with sit<>stand and transfers.  Pt recalled hemi dressing techniques but continues to require assist to complete tasks using RUE.  Pt stated he feels exhausted all the time.  Pt requires multiple rest breaks during therapy session.  Pt performs sit<>stand at sink with mod A and min A for standing balance.  Pt was able to bathe buttocks this morning while OT provided support at Rt knee during standing.  Focus on activity tolerance, sit<>stand, standing balance, functional transfers, hemi bathing and dressing techniques, and safety awareness to increase independence with BADLs.  Therapy Documentation Precautions:  Precautions Precautions: Fall Restrictions Weight Bearing Restrictions: No Pain: Pain Assessment Pain Assessment: No/denies pain  See Function Navigator for Current Functional Status.   Therapy/Group: Individual Therapy  Sean Wilkerson,  Sean Wilkerson 10/15/2015, 9:58 AM

## 2015-10-15 NOTE — Progress Notes (Signed)
[place] PHYSICAL MEDICINE & REHABILITATION     PROGRESS NOTE    Subjective/Complaints: Patient seen this morning sitting up in bed and breakfast with SLP. He notes he had a good night but continues to have neuropathic pain in his right lower extremity although it was better overnight with the addition of medication. He also feels that his sensation is improving  ROS: +RLE pain. Denies CP, SOB, N/V/D   Objective: Vital Signs: Blood pressure 145/63, pulse 69, temperature 98.6 F (37 C), temperature source Oral, resp. rate 18, SpO2 100 %. No results found. No results for input(s): WBC, HGB, HCT, PLT in the last 72 hours. No results for input(s): NA, K, CL, GLUCOSE, BUN, CREATININE, CALCIUM in the last 72 hours.  Invalid input(s): CO CBG (last 3)  No results for input(s): GLUCAP in the last 72 hours.  Wt Readings from Last 3 Encounters:  10/05/15 115.5 kg (254 lb 10.1 oz)    Physical Exam:  BP 145/63 mmHg  Pulse 69  Temp(Src) 98.6 F (37 C) (Oral)  Resp 18  SpO2 100% Constitutional: He appears well-developed and well-nourished. NAD. Vital signs reviewed HENT: Normocephalic and atraumatic.  Mouth/Throat: Oropharynx is pink, clear and moist.  Eyes: Conjunctivae and EOM are normal. Right eye exhibits no discharge. Left eye exhibits no discharge.  Cardiovascular: Normal rate and regular rhythm.No murmur heard. Respiratory: Effort normal and breath sounds normal. No respiratory distress. He has no wheezes.  GI: Soft. Bowel sounds are normal. He exhibits no distension. There is no tenderness.  Musculoskeletal: He exhibits mild edema (RUE). He exhibits no tenderness.  Neurological: He is alert and oriented.  Right hemifacial weakness.  Mild dysphonia with moderate dysarthria  Has good awareness and insight into deficits.  LUE/LLE: 5/5 proximal to distal RUE: 0/5 prox to distal. RLE: 1/5 HE and KE, distally 0/5.  Sensation diminished to light touch and right upper and right  lower extremities, improving. Skin: Skin is warm and dry.  Psychiatric: He has a normal mood and affect. His behavior is normal. Judgment and thought content normal.   Assessment/Plan: 1. 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 Physiatrist is providing close team supervision and 24 hour management of active medical problems listed below. Physiatrist and rehab team continue to assess barriers to discharge/monitor patient progress toward functional and medical goals.  Function:  Bathing Bathing position   Position: Wheelchair/chair at sink  Bathing parts Body parts bathed by patient: Right arm, Chest, Abdomen, Front perineal area, Right upper leg, Left upper leg Body parts bathed by helper: Right lower leg, Left lower leg, Buttocks  Bathing assist Assist Level:  (Stedy)      Upper Body Dressing/Undressing Upper body dressing   What is the patient wearing?: Pull over shirt/dress     Pull over shirt/dress - Perfomed by patient: Put head through opening, Thread/unthread left sleeve Pull over shirt/dress - Perfomed by helper: Thread/unthread right sleeve, Pull shirt over trunk        Upper body assist Assist Level: Touching or steadying assistance(Pt > 75%)      Lower Body Dressing/Undressing Lower body dressing   What is the patient wearing?: Pants, Socks, Shoes     Pants- Performed by patient: Thread/unthread left pants leg Pants- Performed by helper: Thread/unthread right pants leg, Pull pants up/down       Socks -  Performed by helper: Don/doff right sock, Don/doff left sock   Shoes - Performed by helper: Don/doff right shoe, Don/doff left shoe, Fasten right, Fasten left          Lower body assist Assist for lower body dressing:  (Stedy)      Toileting Toileting  Toileting activity did not occur: Safety/medical concerns   Toileting steps completed by helper: Adjust clothing prior to toileting, Performs perineal hygiene, Adjust clothing after toileting    Toileting assist Assist level:  (Stedy)   Transfers Chair/bed transfer   Chair/bed transfer method: Stand pivot Chair/bed transfer assist level: Touching or steadying assistance (Pt > 75%) Chair/bed transfer assistive device: Sliding board Mechanical lift: Landscape architecttedy   Locomotion Ambulation     Max distance: 15 Assist level: 2 helpers   Wheelchair   Type: Manual Max wheelchair distance: 100 Assist Level: Touching or steadying assistance (Pt > 75%)  Cognition Comprehension Comprehension assist level: Understands complex 90% of the time/cues 10% of the time  Expression Expression assist level: Expresses basic 75 - 89% of the time/requires cueing 10 - 24% of the time. Needs helper to occlude trach/needs to repeat words.  Social Interaction Social Interaction assist level: Interacts appropriately 90% of the time - Needs monitoring or encouragement for participation or interaction.  Problem Solving Problem solving assist level: Solves basic 90% of the time/requires cueing < 10% of the time  Memory Memory assist level: Recognizes or recalls 75 - 89% of the time/requires cueing 10 - 24% of the time   Medical Problem List and Plan: 1. 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 DVT Prophylaxis/Anticoagulation: Mechanical: Sequential compression devices, below knee Bilateral lower extremities 3. Pain Management: Tylenol prn.  4. Mood: LCSW to follow for evaluation and support.  5. Neuropsych: This patient is capable of making decisions on hid own behalf. 6. Skin/Wound Care: Routine pressure relief measures.  7. Fluids/Electrolytes/Nutrition: Monitor I/O.   -Electrolytes WNL on 12/22 8.  HTN: Monitor BP tid with permissive HTN and gradual normalization over 5 days. Continue norvasc and coreg daily.  -Norvasc increased to 10MG  on 12/24  -Coreg increased to 12.5 on 10/26, within acceptable range at present.   -Will consider further increase if necessary 9. Dyslipidemia: Cotinue Lipitor daily.  10. Neuropathic pain: Will increase  Gabapentin 300 to 3 times a day   LOS (Days) 7 A FACE TO FACE EVALUATION WAS PERFORMED  Ankit Karis Jubanil Patel 10/15/2015 8:37 AM

## 2015-10-15 NOTE — Patient Care Conference (Signed)
Inpatient RehabilitationTeam Conference and Plan of Care Update Date: 10/14/2015   Time: 11:10 AM    Patient Name: Sean Wilkerson      Medical Record Number: [id number]  Date of Birth: [birth year] Sex: Male         Room/Bed: 4M11C/4M11C-01 Payor Info: Payor: [employer] MEDICARE / Plan: [employer] MEDICARE / Product Type: *No Product type* /    Admitting Diagnosis: L cva  Admit Date/Time:  10/08/2015  5:27 PM Admission Comments: No comment available   Primary Diagnosis:  Stroke, acute, thrombotic (HCC) Principal Problem: Stroke, acute, thrombotic [facility](HCC)  Patient Active Problem List   Diagnosis Date Noted  . Neuropathic pain   . Stroke, acute, thrombotic (HCC) 10/08/2015  . 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 Essential hypertension 10/08/2015  . HLD (hyperlipidemia) 10/08/2015  . Hemiplegia and hemiparesis following cerebral infarction affecting right dominant side (HCC)   . Right hemiplegia (HCC) 10/06/2015  . Stroke (cerebrum) (HCC) 10/05/2015  . CVA (cerebral infarction) 10/05/2015    Expected Discharge Date: Expected Discharge Date: 10/30/15  Team Members Present: Physician leading conference: Dr. Maryla MorrowAnkit Patel Social Worker Present: Amada JupiterLucy Alfie Alderfer, LCSW Nurse Present: Chana Bodeeborah Sharp, RN PT Present: Edman CircleAudra Hall, PT OT Present: Ardis Rowanom [name], Daneil DolinOTA;Katie Pittman, OT SLP Present: Feliberto Gottronourtney Payne, SLP PPS Coordinator present : Tora DuckMarie Noel, RN, CRRN     Current Status/Progress Goal Weekly Team Focus  Medical   Hemiplegia, dysarthria, dysphagia secondary to left corona radiata, external capsule, and posterior left lentiform nuclei infarct now with neuropathic pain and uncontrolled HTN  Adjust BP meds and neuropathic pain meds  see above   Bowel/Bladder   Continent of bowel and bladder. LBM 10/12/15  Pt to remain continent of bowel and bladder  Monitor   Swallow/Nutrition/ Hydration   Dys. 3 textures with thin  liquids, Intermittent supervision  Mod I with least restrictive diet  tolerance of current diet with increased use of swallowing strategies, possible trials of regular textures    ADL's   bathing-mod A; UB dressing-mod A; LB dressing-tot A; functional transfers-max A  min A overall  functional transfers; BADL retraining, sit<>stand, standing balance, toileting   Mobility   Mod A sit to stand transfer; 15 feet with hemiwalker +2 assist; 2 steps max assist, WC propulsion 25 feet min assist   min A overall   functional mobility, endurance, balance, strengthening    Communication   Min-Mod A   Mod I at sentence level  increased use of speech intelligibility strategies, emergent awareness of errors    Safety/Cognition/ Behavioral Observations            Pain   No c/o pain   <3  Monitor for nonverbal cues of pain   Skin   CDI  CDI  CDI    Rehab Goals Patient on target to meet rehab goals: Yes *See Care Plan and progress notes for long and short-term goals.  Barriers to Discharge: Uncontrolled BP, neuropathic pain    Possible Resolutions to Barriers:  Adjust BP meds, optimize pain meds    Discharge Planning/Teaching Needs:  Home with family able to provide 24/7 assistance  Teaching is ongoing.   Team Discussion:  BP issues - adjusting meds.  Very dense hemiplegia.  Currently max assist tfs and +2 for toilet tfs.  May need ramp at d/c.  Hope to reach min assist w/c level goals.  Revisions to Treatment Plan:  None   Continued Need for Acute  Rehabilitation Level of Care: The patient requires daily medical management by a physician with specialized training in physical medicine and rehabilitation for the following conditions: Daily direction of a multidisciplinary physical rehabilitation program to ensure safe treatment while eliciting the highest outcome that is of practical value to the patient.: Yes Daily medical management of patient stability for increased activity during  participation in an intensive rehabilitation regime.: Yes Daily analysis of laboratory values and/or radiology reports with any subsequent need for medication adjustment of medical intervention for : Neurological problems  Giannis Corpuz 10/15/2015, 12:38 PM

## 2015-10-15 NOTE — Progress Notes (Signed)
Social Work Patient ID: Sean BirchwoodLarry Lee Cocco, male   DOB: 1946/05/17, 69 y.o.   MRN: [id number]   Have reviewed team conference with pt who is aware and agreeable with targeted d/c date of 10/30/15 and min assist goals.  Discussed probable need for ramp and he will check to see if church could assist with this.  I am awaiting wife's arrival and will review info with her as well.  Jennae Hakeem, LCSW

## 2015-10-15 NOTE — Progress Notes (Signed)
Physical Therapy Session Note  Patient Details  Name: Sean Wilkerson MRN: [id number] Date of Birth: [birth year]  Today's Date: 10/15/2015 PT Individual Time: 1355-1410 PT Individual Time Calculation (min): 15 min   PT Concurrent Treatment 45 minutes from 1410-3013.   Short Term Goals: Week 1:  PT Short Term Goal 1 (Week 1): Patient will transfer bed <> wheelchair with assist of one person. PT Short Term Goal 2 (Week 1): Patient will perform sit <> stand with consistent mod A.  PT Short Term Goal 3 (Week 1): Patient will ambulate 25 ft with assist of one person. PT Short Term Goal 4 (Week 1): Patient will perform stair negotiation with assist of 2.  PT Short Term Goal 5 (Week 1): Patient will propel wheelchair via L hemi technique x 50 ft with supervision.   Therapy Documentation Precautions:  Precautions Precautions: Fall Restrictions Weight Bearing Restrictions: No  Pain: Pain Assessment Pain Assessment: 0-10 Pain Score: 3  Pain Location: Leg Pain Orientation: Right Pain Descriptors / Indicators: Aching Patients Stated Pain Goal: 3 Pain Intervention(s): Rest;Relaxation  Transfers: Sit to and from stand transfer with moderate assist and left hemiwalker; verbal cues for hand and foot placement, even weight distribution and controlled descent.   Supine to and from short sit edge of mat with moderate assistance RLE management and trunk control.   Bridging 3x10 with approximation of RLE.   Pre-gait activities: Patient performed rapid stepping for approximately for 2 minutes and 90 seconds with LLE max assist with left hemiwalker in order to increase weight bearing of RLE for strengthening and initiation of protective stepping strategies. Approximation applied at right knee to prevent buckling.   Patient stood two trials without assistive device max assist performing reaching and match tasks with playing cards with moderate excursions. Patient matched 18 cards on each  trial.    Patient ambulated 20 feet with left hemiwalker and max assistx1; +2 for wheelchair follow and safety. Patient ambulated with a step to pattern. Patient required total assist for advancement, placement and stabilization of RLE. Patient able to advance hemiwalker. Patient Verbal cues required throughout. Educated on proper sequence and technique.  Patient tolerated treatment well. Vitals monitored and remained stable throughout session responding appropriately to activity. Patient reports occasional pain in right shoulder no pain reported during session. Patient tolerated session with rest breaks throughout. Patient returned to room at end of session with all needs met resting comfortably in chair at bedside. Call bell within reach and patient educated not to be up without assistance. Patient verbalized understanding.   See Function Navigator for Current Functional Status.   Therapy/Group: Individual Therapy an Concurrent Therapy .   Retta Diones 10/15/2015, 3:41 PM

## 2015-10-15 NOTE — Progress Notes (Signed)
Speech Language Pathology Daily Session Note  Patient Details  Name: Sean Wilkerson MRN: [id number] Date of Birth: June 26, 1946  Today's Date: 10/15/2015 SLP Individual Time: 0725-0825 SLP Individual Time Calculation (min): 60 min  Short Term Goals: Week 1: SLP Short Term Goal 1 (Week 1): Patient will consume current diet with minimal overt s/s of aspiration with supervision verbal cues for use of small bites/sips via cup.  SLP Short Term Goal 2 (Week 1): Patient will utilize speech intelligibility strategies at the phrase level with Min A verbal cues to self-monitor and correct errors.  SLP Short Term Goal 3 (Week 1): Patient will utilize diaphragmatic breathing at the word level with Min A verbal cues for accuracy and to self-monitor and correct errors.  SLP Short Term Goal 4 (Week 1): Patient will produce words with bilabial consonants in the first, medial and final position with 90% intelligibility with Min A verbal cues to self-monitor and correct errors.   Skilled Therapeutic Interventions: Skilled treatment session focused on dysphagia and speech goals. SLP facilitated session by providing skilled observation with breakfast meal of Dys. 3 textures with thin liquids via cup. Patient consumed meal without overt s/s of aspiration and required intermittent verbal cues for use of small bites/sips. SLP also facilitated session by providing Min A verbal cues for pacing, breath support and over-articulation at the phrase and sentence level to maximize intelligibility to 90% during a verbal description task. Patient left supine in bed with all needs within reach and bed alarm on. Continue with current plan of care.   Function:  Eating Eating   Modified Consistency Diet: Yes Eating Assist Level: Set up assist for;Supervision or verbal cues   Eating Set Up Assist For: Opening containers;Cutting food       Cognition Comprehension Comprehension assist level: Understands complex 90% of the  time/cues 10% of the time  Expression   Expression assist level: Expresses basic 75 - 89% of the time/requires cueing 10 - 24% of the time. Needs helper to occlude trach/needs to repeat words.  Social Interaction Social Interaction assist level: Interacts appropriately 90% of the time - Needs monitoring or encouragement for participation or interaction.  Problem Solving Problem solving assist level: Solves basic 90% of the time/requires cueing < 10% of the time  Memory Memory assist level: Recognizes or recalls 75 - 89% of the time/requires cueing 10 - 24% of the time    Pain Pain Assessment Pain Assessment: No/denies pain  Therapy/Group: Individual Therapy  Braxon Suder 10/15/2015, 8:30 AM

## 2015-10-16 ENCOUNTER — Inpatient Hospital Stay (HOSPITAL_COMMUNITY): Payer: Medicare Other | Admitting: Speech Pathology

## 2015-10-16 ENCOUNTER — Inpatient Hospital Stay (HOSPITAL_COMMUNITY): Payer: Medicare Other | Admitting: Physical Therapy

## 2015-10-16 ENCOUNTER — Inpatient Hospital Stay (HOSPITAL_COMMUNITY): Payer: Medicare Other | Admitting: *Deleted

## 2015-10-16 DIAGNOSIS — K5901 Slow transit constipation: Secondary | ICD-10-CM

## 2015-10-16 MED ORDER — POLYETHYLENE GLYCOL 3350 17 G PO PACK
17.0000 g | PACK | Freq: Every day | ORAL | Status: DC
  Administered 2015-10-16 – 2015-10-30 (×15): 17 g via ORAL
  Filled 2015-10-16 (×15): qty 1

## 2015-10-16 NOTE — Progress Notes (Signed)
[place] PHYSICAL MEDICINE & REHABILITATION     PROGRESS NOTE    Subjective/Complaints: Patient seen this morning sitting up in his chair participating in therapies. He notes he has not had a bowel movement in 2 days. He also notes that his sensation continues to improve  ROS: +RLE pain, constipation. Denies CP, SOB, N/V/D   Objective: Vital Signs: Blood pressure 138/76, pulse 76, temperature 98.6 F (37 C), temperature source Oral, resp. rate 18, weight 113.5 kg (250 lb 3.6 oz), SpO2 100 %. No results found. No results for input(s): WBC, HGB, HCT, PLT in the last 72 hours. No results for input(s): NA, K, CL, GLUCOSE, BUN, CREATININE, CALCIUM in the last 72 hours.  Invalid input(s): CO CBG (last 3)  No results for input(s): GLUCAP in the last 72 hours.  Wt Readings from Last 3 Encounters:  10/15/15 113.5 kg (250 lb 3.6 oz)  10/05/15 115.5 kg (254 lb 10.1 oz)    Physical Exam:  BP 138/76 mmHg  Pulse 76  Temp(Src) 98.6 F (37 C) (Oral)  Resp 18  Wt 113.5 kg (250 lb 3.6 oz)  SpO2 100% Constitutional: He appears well-developed and well-nourished. NAD. Vital signs reviewed HENT: Normocephalic and atraumatic.  Mouth/Throat: Oropharynx is pink, clear and moist.  Eyes: Conjunctivae and EOM are normal. Right eye exhibits no discharge. Left eye exhibits no discharge.  Cardiovascular: Normal rate and regular rhythm.No murmur heard. Respiratory: Effort normal and breath sounds normal. No respiratory distress. He has no wheezes.  GI: Soft. Bowel sounds are normal. He exhibits no distension. There is no tenderness.  Musculoskeletal: He exhibits no edema. He exhibits no tenderness.  Neurological: He is alert and oriented.  Right hemifacial weakness.  Mild dysphonia with moderate dysarthria  Has good awareness and insight into deficits.  LUE/LLE: 5/5 proximal to distal RUE: 0/5 prox to distal. RLE: 1/5 HE and KE, distally 0/5.  Sensation diminished to light touch and right upper  and right lower extremities, improving. Skin: Skin is warm and dry.  Psychiatric: He has a normal mood and affect. His behavior is normal. Judgment and thought content normal.   Assessment/Plan: 1. 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 Physiatrist is providing close team supervision and 24 hour management of active medical problems listed below. Physiatrist and rehab team continue to assess barriers to discharge/monitor patient progress toward functional and medical goals.  Function:  Bathing Bathing position   Position: Wheelchair/chair at sink  Bathing parts Body parts bathed by patient: Right arm, Chest, Abdomen, Front perineal area, Right upper leg, Left upper leg Body parts bathed by helper: Right lower leg, Left lower leg, Buttocks  Bathing assist Assist Level: 2 helpers      Upper Body Dressing/Undressing Upper body dressing   What is the patient wearing?: Pull over shirt/dress     Pull over shirt/dress - Perfomed by patient: Put head through opening, Thread/unthread left sleeve Pull over shirt/dress - Perfomed by helper: Thread/unthread right sleeve, Pull shirt over trunk        Upper body assist Assist Level: Touching or steadying assistance(Pt > 75%)      Lower Body Dressing/Undressing Lower body dressing   What is the patient wearing?: Underwear, Pants, Socks, Shoes   Underwear - Performed by helper: Thread/unthread right underwear leg, Thread/unthread left underwear leg, Pull underwear up/down Pants- Performed by patient:  Thread/unthread left pants leg Pants- Performed by helper: Thread/unthread right pants leg, Pull pants up/down       Socks - Performed by helper: Don/doff right sock, Don/doff left sock   Shoes - Performed by helper: Don/doff right shoe,  Don/doff left shoe, Fasten right, Fasten left          Lower body assist Assist for lower body dressing: 2 Designer, multimediaHelpers      Toileting Toileting Toileting activity did not occur: Safety/medical concerns   Toileting steps completed by helper: Adjust clothing prior to toileting, Performs perineal hygiene, Adjust clothing after toileting    Toileting assist Assist level:  (Stedy)   Transfers Chair/bed transfer   Chair/bed transfer method: Stand pivot Chair/bed transfer assist level: Touching or steadying assistance (Pt > 75%) Chair/bed transfer assistive device: Sliding board Mechanical lift: Landscape architecttedy   Locomotion Ambulation     Max distance: 20 Assist level: 2 helpers   Wheelchair   Type: Manual Max wheelchair distance: 100 Assist Level: Touching or steadying assistance (Pt > 75%)  Cognition Comprehension Comprehension assist level: Understands complex 90% of the time/cues 10% of the time  Expression Expression assist level: Expresses basic 75 - 89% of the time/requires cueing 10 - 24% of the time. Needs helper to occlude trach/needs to repeat words.  Social Interaction Social Interaction assist level: Interacts appropriately 90% of the time - Needs monitoring or encouragement for participation or interaction.  Problem Solving Problem solving assist level: Solves basic 90% of the time/requires cueing < 10% of the time  Memory Memory assist level: Recognizes or recalls 75 - 89% of the time/requires cueing 10 - 24% of the time   Medical Problem List and Plan: 1. 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 DVT Prophylaxis/Anticoagulation: Mechanical: Sequential compression devices, below knee Bilateral lower extremities 3. Pain Management: Tylenol prn.  4. Mood: LCSW to follow for evaluation and support.  5. Neuropsych: This patient is capable of making decisions on hid own  behalf. 6. Skin/Wound Care: Routine pressure relief measures.  7. Fluids/Electrolytes/Nutrition: Monitor I/O.   -Electrolytes WNL on 12/22 8. HTN: Monitor BP tid with permissive HTN and gradual normalization over 5 days. Continue norvasc and coreg daily.  -Norvasc increased to 10MG  on 12/24  -Coreg increased to 12.5 on 10/26, within acceptable range at present.   -Will consider further increase if necessary 9. Dyslipidemia: Cotinue Lipitor daily.  10. Neuropathic pain: Gabapentin increased to 300 to 3 times a day on 12/28  -Patient continues to have pain, but it is improving. Will increase meds tomorrow 11. Constipation  - Will increase scheduled and when necessary medications  LOS (Days) 8 A FACE TO FACE EVALUATION WAS PERFORMED  Maveryck Bahri Karis Jubanil Ashliegh Parekh 10/16/2015 8:18 AM

## 2015-10-16 NOTE — Progress Notes (Signed)
Occupational Therapy Weekly Progress Note  Patient Details  Name: Sean Wilkerson MRN: [id number] Date of Birth: 10/18/1946  Beginning of progress report period: October 09, 2015 End of progress report period: October 16, 2015  Patient has met 3 of 5 short term goals.  Pt has made consistent progress with BADLs since admission but continues to require +2 assistance for functional transfers and sit<>stand for LB BADLs. Pt currently requires tot A to use RUE as a stabilizer during functional transfers.  Pt fatigues quickly and requires multiple rest breaks throughout therapy sessions. Pt has been educated in hemi dressing and bathing techniques and recalls techniques but continues to require assistance to incorporate into BADLs. Patient continues to demonstrate the following deficits: abnormal posture, flaccid hemiplegia and hemiparesis, hemiplegia affecting non-dominant side, muscle weakness (generalized) and swelling of limb and therefore will continue to benefit from skilled OT intervention to enhance overall performance with BADL and Reduce care partner burden.  Patient progressing toward long term goals..  Continue plan of care.  OT Short Term Goals Week 1:  OT Short Term Goal 1 (Week 1): Pt will dress UB with mod A using hemi technique OT Short Term Goal 1 - Progress (Week 1): Met OT Short Term Goal 2 (Week 1): Pt will complete toilet transfer with max A +1 OT Short Term Goal 2 - Progress (Week 1): Progressing toward goal OT Short Term Goal 3 (Week 1): Pt will display carry over of hemi dressing technique with min VCs OT Short Term Goal 3 - Progress (Week 1): Met OT Short Term Goal 4 (Week 1): Pt will maintain static standing balance with min A in prep for LB dressing task OT Short Term Goal 4 - Progress (Week 1): Met OT Short Term Goal 5 (Week 1): Pt will utilize L UE during functional task with max A OT Short Term Goal 5 - Progress (Week 1): Progressing toward goal Week 2:  OT  Short Term Goal 1 (Week 2): Pt will complete toilet transfer with max A + 1 OT Short Term Goal 2 (Week 2): Pt will complete toileting tasks with max A OT Short Term Goal 3 (Week 2): Pt will utilize LUE as stabilizer during functional tasks with max A OT Short Term Goal 4 (Week 2): Pt will complete LB dressing tasks with max A OT Short Term Goal 5 (Week 2): Pt will complete UB dressing tasks with min A  Therapy Documentation Precautions:  Precautions Precautions: Fall Restrictions Weight Bearing Restrictions: No  See Function Navigator for Current Functional Status.    [name], Thomas Chappell 10/16/2015, 8:29 AM   

## 2015-10-16 NOTE — Progress Notes (Signed)
Speech Language Pathology Weekly Progress and Session Note  Patient Details  Name: Sean Wilkerson MRN: [id number] Date of Birth: 1946-01-02  Beginning of progress report period: October 08, 2015 End of progress report period: October 16, 2015  Today's Date: 10/16/2015 SLP Individual Time: 1100-1200 SLP Individual Time Calculation (min): 60 min  Short Term Goals: Week 1: SLP Short Term Goal 1 (Week 1): Patient will consume current diet with minimal overt s/s of aspiration with supervision verbal cues for use of small bites/sips via cup.  SLP Short Term Goal 1 - Progress (Week 1): Not met SLP Short Term Goal 2 (Week 1): Patient will utilize speech intelligibility strategies at the phrase level with Min A verbal cues to self-monitor and correct errors.  SLP Short Term Goal 2 - Progress (Week 1): Met SLP Short Term Goal 3 (Week 1): Patient will utilize diaphragmatic breathing at the word level with Min A verbal cues for accuracy and to self-monitor and correct errors.  SLP Short Term Goal 3 - Progress (Week 1): Not met SLP Short Term Goal 4 (Week 1): Patient will produce words with bilabial consonants in the first, medial and final position with 90% intelligibility with Min A verbal cues to self-monitor and correct errors.  SLP Short Term Goal 4 - Progress (Week 1): Met    New Short Term Goals: Week 2: SLP Short Term Goal 1 (Week 2): Patient will consume current diet with minimal overt s/s of aspiration with supervision verbal cues for use of small bites/sips via cup.  SLP Short Term Goal 2 (Week 2): Patient will utilize speech intelligibility strategies at the phrase level with supervision verbal cues to self-monitor and correct errors.  SLP Short Term Goal 3 (Week 2): Patient will utilize diaphragmatic breathing at the word level with Min A verbal cues for accuracy and to self-monitor and correct errors.   Weekly Progress Updates: Patient has made functional gains and has met 2 of 4  STG's this reporting period. Currently, patient is consuming Dys. 3 textures with thin liquids with minimal overt s/s of aspiration and requires supervision-Min A verbal cues for use of small bites/sips and a slow rate of self-feeding.  Patient also requires supervision-Min A for use of speech intelligibility strategies at the phrase level and Min-Mod A verbal and visual cues for use of diaphragmatic breathing at the word level. Patient and family education is ongoing. Patient would benefit from continued skill SLP intervention to maximize speech and swallowing function and overall functional independence prior to discharge.    Intensity: Minumum of 1-2 x/day, 30 to 90 minutes Frequency: 3 to 5 out of 7 days Duration/Length of Stay: 10/30/15 Treatment/Interventions: Cueing hierarchy;Functional tasks;Dysphagia/aspiration precaution training;Environmental controls;Speech/Language facilitation;Internal/external aids;Patient/family education;Therapeutic Activities   Daily Session  Skilled Therapeutic Interventions: Skilled treatment session focused on dysphagia and speech goals. SLP facilitated session by providing skilled observation with lunch meal of Dys. 3 textures with thin liquids via cup. Patient consumed meal with overt cough X 1, suspect due to mixed consistencies and required Min A verbal cues for use of small bites/sips and a slow rate of self-feeding. SLP also facilitated session by providing Min A verbal cues for pacing, breath support and over-articulation at the phrase level to maximize intelligibility to 90% during a picture description task. Patient left upright in wheelchair with all needs within reach. Continue with current plan of care.        Function:   Eating Eating   Modified Consistency Diet: Yes Eating Assist  Level: Set up assist for;Supervision or verbal cues   Eating Set Up Assist For: Opening containers;Cutting food       Cognition Comprehension Comprehension assist  level: Understands complex 90% of the time/cues 10% of the time  Expression   Expression assist level: Expresses basic 75 - 89% of the time/requires cueing 10 - 24% of the time. Needs helper to occlude trach/needs to repeat words.  Social Interaction Social Interaction assist level: Interacts appropriately 90% of the time - Needs monitoring or encouragement for participation or interaction.  Problem Solving Problem solving assist level: Solves basic 90% of the time/requires cueing < 10% of the time  Memory Memory assist level: Recognizes or recalls 75 - 89% of the time/requires cueing 10 - 24% of the time   Pain Sharp pain in right arm, unable to rate. RN administered medications   Therapy/Group: Individual Therapy  Ishaaq Penna 10/16/2015, 4:17 PM

## 2015-10-16 NOTE — Progress Notes (Signed)
Occupational Therapy Session Note  Patient Details  Name: Sean Wilkerson MRN: [id number] Date of Birth: [birth year]  Today's Date: 10/16/2015 OT Individual Time: 0700-0830 OT Individual Time Calculation (min): 90 min    Short Term Goals: Week 2:  OT Short Term Goal 1 (Week 2): Pt will complete toilet transfer with max A + 1 OT Short Term Goal 2 (Week 2): Pt will complete toileting tasks with max A OT Short Term Goal 3 (Week 2): Pt will utilize LUE as stabilizer during functional tasks with max A OT Short Term Goal 4 (Week 2): Pt will complete LB dressing tasks with max A OT Short Term Goal 5 (Week 2): Pt will complete UB dressing tasks with min A  Skilled Therapeutic Interventions/Progress Updates:    Pt resting in bed upon arrival.  Pt engaged in BADL retraining including bathing and dressing with sit<>stand from w/c at sink.  Introduced Sports administratorreacher to assist with LB dressing tasks.  Pt required max assist after demonstration with use of reacher. Pt recalls hemi dressing techniques but continues to require assistance to incorporate into BADLs.  Pt requires max A for sit<>stand and min A for standing balance to bathe buttocks and pull up pants.  Pt transitioned to self feeding with setup to open containers.  Pt transitioned to w/c mobility to increase independence at w/c level.  Focus on activity tolerance, sit<>stand, standing balance, w/c mobility, self feeding, functional transfers, and safety awareness to increase independence with BADLs.  Therapy Documentation Precautions:  Precautions Precautions: Fall Restrictions Weight Bearing Restrictions: No Pain: Pain Assessment Pain Assessment: No/denies pain  See Function Navigator for Current Functional Status.   Therapy/Group: Individual Therapy  Rich BraveLanier, [name] 10/16/2015, 8:32 AM

## 2015-10-16 NOTE — Progress Notes (Signed)
Physical Therapy Weekly Progress Note  Patient Details  Name: Sean Wilkerson MRN: [id number] Date of Birth: 1946-07-19  Beginning of progress report period: October 08, 2015 End of progress report period: October 16, 2015  1300-1315 15 min individual treatment 1315-1345 30 min concurrent treatment  1345-1400 15 min individual treatment   Patient has met 5 of 5 short term goals.   Patient continues to demonstrate the following deficits: impaired sensation, proprioception, decreased strength and motor control, impaired balance and decreased cardiovascular endurance and therefore will continue to benefit from skilled PT intervention to enhance overall performance with activity tolerance, balance, postural control, ability to compensate for deficits, functional use of  right upper extremity and right lower extremity, attention, awareness and coordination.  Patient progressing toward long term goals..  Continue plan of care.  PT Short Term Goals Week 1:  PT Short Term Goal 1 (Week 1): Patient will transfer bed <> wheelchair with assist of one person. PT Short Term Goal 1 - Progress (Week 1): Met PT Short Term Goal 2 (Week 1): Patient will perform sit <> stand with consistent mod A.  PT Short Term Goal 2 - Progress (Week 1): Met PT Short Term Goal 3 (Week 1): Patient will ambulate 25 ft with assist of one person. PT Short Term Goal 3 - Progress (Week 1): Partly met PT Short Term Goal 4 (Week 1): Patient will perform stair negotiation with assist of 2.  PT Short Term Goal 4 - Progress (Week 1): Met PT Short Term Goal 5 (Week 1): Patient will propel wheelchair via L hemi technique x 50 ft with supervision. PT Short Term Goal 5 - Progress (Week 1): Met Week 2:  PT Short Term Goal 1 (Week 2): Patient will transfer bed <> wheelchair with min assist of one person. PT Short Term Goal 2 (Week 2): Patient will perform sit <> stand with consistent min A.  PT Short Term Goal 3 (Week 2): Patient  will ambulate 35 ft with max assist of one person. PT Short Term Goal 4 (Week 2): Patient will perform stair negotiation of 4 steps with max assist. PT Short Term Goal 5 (Week 2): Patient will propel wheelchair via L hemi technique x 100 ft with supervision.  Skilled Therapeutic Interventions/Progress Updates: Patient demonstrates continued progress during session. Patient requires max assistx1 for ambulation however +2 assist required for safety with a wheelchair follow.  Patient highly motivated throughout session and responds well with education. Improvements noted with endurance and participation in tasks.      Therapy Documentation Precautions:  Precautions Precautions: Fall Restrictions Weight Bearing Restrictions: No      Patient performed tioleting at beginning of session. Patient continent of bowel and bladder. Patient required mod assist for transfer to and from commode. Patient required total assist for lower body dressing. Patient min to mod assist perineal care and hygiene.   Transfers: Sit to and from stand transfer with moderate assist and left hemiwalker; verbal cues for foot placement, even weight distribution and controlled descent.   Patient ambulated 25 feetx2 with left hemiwalker and max assistx1; +2 for wheelchair follow and safety. Patient ambulated with a step to pattern. Patient required total assist for advancement, placement and stabilization of RLE. Patient able to advance hemiwalker. Patient demonstrates improvements in sequence and technique as well as endurance.   Patient negotiated 2 steps with use of left handrail max assist. Patient descended stairs backwards. Patient Verbal cues required throughout. Educated on proper sequence and technique.  Patient tolerated treatment well.  Patient was without pain during session. Patient tolerated session with rest breaks throughout. Patient returned to room at end of session with all needs met resting comfortably in  chair at bedside. Call bell within reach and patient educated not to be up without assistance. Patient verbalized understanding.   See Function Navigator for Current Functional Status.  Therapy/Group: Individual and concurrent  Retta Diones 10/16/2015, 2:20 PM

## 2015-10-17 ENCOUNTER — Inpatient Hospital Stay (HOSPITAL_COMMUNITY): Payer: Medicare Other | Admitting: Physical Therapy

## 2015-10-17 ENCOUNTER — Inpatient Hospital Stay (HOSPITAL_COMMUNITY): Payer: Medicare Other | Admitting: Speech Pathology

## 2015-10-17 ENCOUNTER — Inpatient Hospital Stay (HOSPITAL_COMMUNITY): Payer: Medicare Other | Admitting: Occupational Therapy

## 2015-10-17 ENCOUNTER — Inpatient Hospital Stay (HOSPITAL_COMMUNITY): Payer: Medicare Other

## 2015-10-17 MED ORDER — BISACODYL 10 MG RE SUPP
10.0000 mg | Freq: Every day | RECTAL | Status: DC | PRN
  Administered 2015-10-18: 10 mg via RECTAL
  Filled 2015-10-17: qty 1

## 2015-10-17 MED ORDER — GABAPENTIN 600 MG PO TABS
600.0000 mg | ORAL_TABLET | Freq: Three times a day (TID) | ORAL | Status: DC
  Administered 2015-10-17 – 2015-10-19 (×7): 600 mg via ORAL
  Filled 2015-10-17 (×7): qty 1

## 2015-10-17 NOTE — Progress Notes (Signed)
Physical Therapy Session Note  Patient Details  Name: Sean Wilkerson MRN: [id number] Date of Birth: [birth year]  Today's Date: 10/17/2015 PT Individual Time: 1100-1158 PT Individual Time Calculation (min): 58 min   Short Term Goals: Week 2:  PT Short Term Goal 1 (Week 2): Patient will transfer bed <> wheelchair with min assist of one person. PT Short Term Goal 2 (Week 2): Patient will perform sit <> stand with consistent min A.  PT Short Term Goal 3 (Week 2): Patient will ambulate 35 ft with max assist of one person. PT Short Term Goal 4 (Week 2): Patient will perform stair negotiation of 4 steps with max assist. PT Short Term Goal 5 (Week 2): Patient will propel wheelchair via L hemi technique x 100 ft with supervision.      Therapy Documentation Precautions:  Precautions Precautions: Fall Restrictions Weight Bearing Restrictions: No   Pain: Pain Assessment Pain Assessment: 0-10 Pain Score: 3  Pain Type: Acute pain Pain Location: Shoulder Pain Orientation: Right Pain Descriptors / Indicators: Aching Pain Onset: With Activity Patients Stated Pain Goal: 2 Pain Intervention(s): Relaxation;Rest   Transfers: Sit to and from stand transfer with moderate assist and left hemiwalker; verbal cues for foot placement, even weight distribution and controlled descent.   Patient ambulated 30 feet with left hemiwalker and max assistx1; +2 for wheelchair follow and safety.   Patient stood 3 minutes without assistive device mod to max assist. Emphasis on even weight distribution, positioning in midline and upright posture.    Patient performed tall kneeling during session on mat in order to facilitate hip strengthing and control, as well postural awareness and stability. Patient transitioned to tall kneel from a standing position max assist and from tall kneel to a standing position max assist. Patient remained in tall kneel position for 2 and half minutes with max assist.   Sweep  tapping applied to quad muscle on right in order to facilitate muscle activation 30x.  Patient tolerated treatment well.RN present to provide pain medication during session. Patient tolerated session with rest breaks throughout. Patient became emotionally labile during session reported feeling overwhelmed able to be consoled during session and continue with therapy.  Patient returned to room at end of session with all needs met resting comfortably in chair at bedside.   See Function Navigator for Current Functional Status.   Therapy/Group: Individual Therapy  Retta Diones 10/17/2015, 12:43 PM

## 2015-10-17 NOTE — Progress Notes (Signed)
[facility]     PROGRESS NOTE    Subjective/Complaints: Patient seen this morning resting in bed. He continues to have right-sided pain. He notes his sensation is the same as the previous day.  ROS: +RLE pain, constipation. Denies CP, SOB, N/V/D   Objective: Vital Signs: Blood pressure 121/87, pulse 65, temperature 98 F (36.7 C), temperature source Oral, resp. rate 18, weight 113.5 kg (250 lb 3.6 oz), SpO2 96 %. No results found. No results for input(s): WBC, HGB, HCT, PLT in the last 72 hours. No results for input(s): NA, K, CL, GLUCOSE, BUN, CREATININE, CALCIUM in the last 72 hours.  Invalid input(s): CO CBG (last 3)  No results for input(s): GLUCAP in the last 72 hours.  Wt Readings from Last 3 Encounters:  10/15/15 113.5 kg (250 lb 3.6 oz)  10/05/15 115.5 kg (254 lb 10.1 oz)    Physical Exam:  BP 121/87 mmHg  Pulse 65  Temp(Src) 98 F (36.7 C) (Oral)  Resp 18  Wt 113.5 kg (250 lb 3.6 oz)  SpO2 96% Constitutional: He appears well-developed and well-nourished. NAD. Vital signs reviewed HENT: Normocephalic and atraumatic.  Mouth/Throat: Oropharynx is pink, clear and moist.  Eyes: Conjunctivae and EOM are normal. Right eye exhibits no discharge. Left eye exhibits no discharge.  Cardiovascular: Normal rate and regular rhythm.No murmur heard. Respiratory: Effort normal and breath sounds normal. No respiratory distress. He has no wheezes.  GI: Soft. Bowel sounds are normal. He exhibits no distension. There is no tenderness.  Musculoskeletal: He exhibits no edema. He exhibits no tenderness.  Neurological: He is alert and oriented.  Right hemifacial weakness.  Mild dysphonia with moderate dysarthria  Has good awareness and insight into deficits.  LUE/LLE: 5/5 proximal to distal RUE: 0/5 prox to distal. RLE: 1/5 HE and KE, distally 0/5.  Sensation diminished to light touch and right upper and right lower  extremities. Skin: Skin is  warm and dry.  Psychiatric: He has a normal mood and affect. His behavior is normal. Judgment and thought content normal.   Assessment/Plan: 1. 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 Physiatrist is providing close team supervision and 24 hour management of active medical problems listed below. Physiatrist and rehab team continue to assess barriers to discharge/monitor patient progress toward functional and medical goals.  Function:  Bathing Bathing position   Position: Wheelchair/chair at sink  Bathing parts Body parts bathed by patient: Right arm, Chest, Abdomen, Front perineal area, Right upper leg, Left upper leg Body parts bathed by helper: Right lower leg, Left lower leg, Buttocks  Bathing assist Assist Level: 2 helpers      Upper Body Dressing/Undressing Upper body dressing   What is the patient wearing?: Pull over shirt/dress     Pull over shirt/dress - Perfomed by patient: Put head through opening, Thread/unthread left sleeve Pull over shirt/dress - Perfomed by helper: Thread/unthread right sleeve, Pull shirt over trunk        Upper body assist Assist Level: Touching or steadying assistance(Pt > 75%)      Lower Body Dressing/Undressing Lower body dressing   What is the patient wearing?: Underwear, Pants, Socks, Shoes   Underwear - Performed by helper: Thread/unthread right underwear leg, Thread/unthread left underwear leg, Pull underwear up/down Pants- Performed by patient: Thread/unthread left pants leg Pants- Performed by helper: Thread/unthread  right pants leg, Pull pants up/down       Socks - Performed by helper: Don/doff right sock, Don/doff left sock   Shoes - Performed by helper: Don/doff right shoe, Don/doff left shoe, Fasten right, Fasten left           Lower body assist Assist for lower body dressing: 2 Designer, multimediaHelpers      Toileting Toileting Toileting activity did not occur: Safety/medical concerns   Toileting steps completed by helper: Adjust clothing prior to toileting, Performs perineal hygiene, Adjust clothing after toileting    Toileting assist Assist level:  (Stedy)   Transfers Chair/bed transfer   Chair/bed transfer method: Stand pivot Chair/bed transfer assist level: Touching or steadying assistance (Pt > 75%) Chair/bed transfer assistive device: Sliding board Mechanical lift: Landscape architecttedy   Locomotion Ambulation     Max distance: 20 Assist level: 2 helpers   Wheelchair   Type: Manual Max wheelchair distance: 100 Assist Level: Touching or steadying assistance (Pt > 75%)  Cognition Comprehension Comprehension assist level: Understands complex 90% of the time/cues 10% of the time  Expression Expression assist level: Expresses basic 75 - 89% of the time/requires cueing 10 - 24% of the time. Needs helper to occlude trach/needs to repeat words.  Social Interaction Social Interaction assist level: Interacts appropriately 90% of the time - Needs monitoring or encouragement for participation or interaction.  Problem Solving Problem solving assist level: Solves basic 90% of the time/requires cueing < 10% of the time  Memory Memory assist level: Recognizes or recalls 75 - 89% of the time/requires cueing 10 - 24% of the time   Medical Problem List and Plan: 1. 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 DVT Prophylaxis/Anticoagulation: Mechanical: Sequential compression devices, below knee Bilateral lower extremities 3. Pain Management: Tylenol prn.  4. Mood: LCSW to follow for evaluation and support.  5. Neuropsych: This patient is capable of making decisions on hid own behalf. 6. Skin/Wound Care: Routine pressure relief  measures.  7. Fluids/Electrolytes/Nutrition: Monitor I/O.   -Electrolytes WNL on 12/22  -Will order labs for Monday 8. HTN: Monitor BP tid with permissive HTN and gradual normalization over 5 days. Continue norvasc and coreg daily.  -Norvasc increased to 10MG  on 12/24  -Coreg increased to 12.5 on 10/26  -within acceptable range at present, will consider further increase if necessary 9. Dyslipidemia: Cotinue Lipitor daily.  10. Neuropathic pain:   -Gabapentin increased to 600 to 3 times a day on 12/30 11. Constipation  - Patient was last BM on 12/29  LOS (Days) 9 A FACE TO FACE EVALUATION WAS PERFORMED  Ankit Karis Jubanil Patel 10/17/2015 8:20 AM

## 2015-10-17 NOTE — Progress Notes (Signed)
Speech Language Pathology Daily Session Note  Patient Details  Name: Sean BirchwoodLarry Lee Wickliffe MRN: [id number] Date of Birth: April 10, 1946  Today's Date: 10/17/2015 SLP Individual Time: 1000-1056 SLP Individual Time Calculation (min): 56 min  Short Term Goals: Week 2: SLP Short Term Goal 1 (Week 2): Patient will consume current diet with minimal overt s/s of aspiration with supervision verbal cues for use of small bites/sips via cup.  SLP Short Term Goal 2 (Week 2): Patient will utilize speech intelligibility strategies at the phrase level with supervision verbal cues to self-monitor and correct errors.  SLP Short Term Goal 3 (Week 2): Patient will utilize diaphragmatic breathing at the word level with Min A verbal cues for accuracy and to self-monitor and correct errors.   Skilled Therapeutic Interventions: Skilled treatment session focused on speech goals. SLP facilitated session by providing Min A verbal cues for use of an increased vocal intensity, pacing and over-articulation during a verbal description/barrier task at the phrase level. Patient also participated in RMT assessment. Patient's MIP was 14 cm H2O which is well below the LLN of 51.8 cm H2O and patient's MEP was 38 cm H2O which is also below the LLN of 60.56 cm H2O.  Patient required Min-Mod A verbal cues for accuracy with testing and reported intermittent dizziness and feeling lightheaded which required rest breaks. Devices will be introduced at next scheduled visit. Patient left upright in wheelchair with all needs within reach. Continue with current plan of care.    Function:  Cognition Comprehension Comprehension assist level: Understands complex 90% of the time/cues 10% of the time  Expression   Expression assist level: Expresses basic 75 - 89% of the time/requires cueing 10 - 24% of the time. Needs helper to occlude trach/needs to repeat words.  Social Interaction Social Interaction assist level: Interacts appropriately 90% of the  time - Needs monitoring or encouragement for participation or interaction.  Problem Solving Problem solving assist level: Solves basic 90% of the time/requires cueing < 10% of the time  Memory Memory assist level: Recognizes or recalls 75 - 89% of the time/requires cueing 10 - 24% of the time    Pain Pain Assessment Pain Assessment: 0-10 Pain Score: 4  Pain Type: Acute pain Pain Location: Shoulder Pain Orientation: Right Pain Descriptors / Indicators: Aching Pain Onset: Gradual Patients Stated Pain Goal: 2 Pain Intervention(s): Medication (See eMAR)  Therapy/Group: Individual Therapy  Johathan Province 10/17/2015, 11:52 AM

## 2015-10-17 NOTE — Progress Notes (Signed)
Occupational Therapy Session Note  Patient Details  Name: Sean Wilkerson MRN: [id number] Date of Birth: [birth year]  Today's Date: 10/17/2015 OT Individual Time:  - 0800-0900  (60 min)      Short Term Goals: Week 1:  OT Short Term Goal 1 (Week 1): Pt will dress UB with mod A using hemi technique OT Short Term Goal 1 - Progress (Week 1): Met OT Short Term Goal 2 (Week 1): Pt will complete toilet transfer with max A +1 OT Short Term Goal 2 - Progress (Week 1): Progressing toward goal OT Short Term Goal 3 (Week 1): Pt will display carry over of hemi dressing technique with min VCs OT Short Term Goal 3 - Progress (Week 1): Met OT Short Term Goal 4 (Week 1): Pt will maintain static standing balance with min A in prep for LB dressing task OT Short Term Goal 4 - Progress (Week 1): Met OT Short Term Goal 5 (Week 1): Pt will utilize L UE during functional task with max A OT Short Term Goal 5 - Progress (Week 1): Progressing toward goal Week 2:  OT Short Term Goal 1 (Week 2): Pt will complete toilet transfer with max A + 1 OT Short Term Goal 2 (Week 2): Pt will complete toileting tasks with max A OT Short Term Goal 3 (Week 2): Pt will utilize LUE as stabilizer during functional tasks with max A OT Short Term Goal 4 (Week 2): Pt will complete LB dressing tasks with max A OT Short Term Goal 5 (Week 2): Pt will complete UB dressing tasks with min A  Skilled Therapeutic Interventions/Progress Updates:     Pt resting in bed upon arrival. Pt engaged in BADL at shower level  Went from supine to sit with mod assist and transferred to wc>shower bench with max assist (lateral scooting).  Pt bathed self except for back, Both LE and left UE.  In shower he used wash mit with LUE with max assist to wash RUE.  In wc, OT educates pt on UB dressing (mod assist) and LB dressing max assist with pants and shoes and socks.  Performed sit >stand at w/c level.maintained standing balance with mod assist using  counter with LUE for 2 miniutes.   Focus on activity tolerance, sit<>stand, standing balance, w/c mobility, self feeding, functional transfers, and safety awareness to increase independence with BADLs.   Therapy Documentation Precautions:  Precautions Precautions: Fall Restrictions Weight Bearing Restrictions: No      Pain:  none          See Function Navigator for Current Functional Status.   Therapy/Group: Individual Therapy  Lisa Roca 10/17/2015, 7:52 AM

## 2015-10-17 NOTE — Progress Notes (Signed)
Occupational Therapy Session Note  Patient Details  Name: Sean BirchwoodLarry Lee Wilkerson MRN: [id number] Date of Birth: 02/19/1946  Today's Date: 10/17/2015 OT Individual Time: 1501-1530 OT Individual Time Calculation (min): 29 min   Skilled Therapeutic Interventions/Progress Updates:    Performed NMES on the right shoulder during session for 15 mins without any adverse reactions.  Electrodes place on supraspinatus and posterior deltoid.  On time 10 seconds, off time 20 seconds.  Rate 35pps with 1 second ramp up.  Intensity set at level 23.  Educated pt on self ROM exercises for the shoulder and elbow to wrap up session.  Pt left in wheelchair with spouse and friend present for session.    Therapy Documentation Precautions:  Precautions Precautions: Fall Restrictions Weight Bearing Restrictions: No  Pain: Pain Assessment Pain Assessment: No/denies pain Pain Score: 3  Pain Location: Shoulder Pain Orientation: Right Pain Descriptors / Indicators: Aching Pain Onset: With Activity Pain Intervention(s): Relaxation;Rest ADL: See Function Navigator for Current Functional Status.   Therapy/Group: Individual Therapy  Delora Gravatt OTR/L 10/17/2015, 4:13 PM

## 2015-10-18 ENCOUNTER — Inpatient Hospital Stay (HOSPITAL_COMMUNITY): Payer: Medicare Other | Admitting: Occupational Therapy

## 2015-10-18 ENCOUNTER — Inpatient Hospital Stay (HOSPITAL_COMMUNITY): Payer: Medicare Other | Admitting: Speech Pathology

## 2015-10-18 NOTE — Progress Notes (Signed)
[facility] PHYSICAL MEDICINE & REHABILITATION     PROGRESS NOTE  Subjective/Complaints: Patient seen this morning lying in bed. He did have the patient overnight and requested a suppository. He notes that he did have a bowel movement since that point. He also notes that the pain in his right lower extremity is the same as yesterday  ROS: +RLE pain. Denies CP, SOB, N/V/D   Objective: Vital Signs: Blood pressure 140/64, pulse 65, temperature 98.3 F (36.8 C), temperature source Oral, resp. rate 18, weight 113.5 kg (250 lb 3.6 oz), SpO2 98 %. No results found. No results for input(s): WBC, HGB, HCT, PLT in the last 72 hours. No results for input(s): NA, K, CL, GLUCOSE, BUN, CREATININE, CALCIUM in the last 72 hours.  Invalid input(s): CO CBG (last 3)  No results for input(s): GLUCAP in the last 72 hours.  Wt Readings from Last 3 Encounters:  10/15/15 113.5 kg (250 lb 3.6 oz)  10/05/15 115.5 kg (254 lb 10.1 oz)    Physical Exam:  BP 140/64 mmHg  Pulse 65  Temp(Src) 98.3 F (36.8 C) (Oral)  Resp 18  Wt 113.5 kg (250 lb 3.6 oz)  SpO2 98% Constitutional: He appears well-developed and well-nourished. NAD. Vital signs reviewed HENT: Normocephalic and atraumatic.  Mouth/Throat: Oropharynx is pink, clear and moist.  Eyes: Conjunctivae and EOM are normal. Right eye exhibits no discharge. Left eye exhibits no discharge.  Cardiovascular: Normal rate and regular rhythm.No murmur heard. Respiratory: Effort normal and breath sounds normal. No respiratory distress. He has no wheezes.  GI: Soft. Bowel sounds are normal. He exhibits no distension. There is no tenderness.  Musculoskeletal: He exhibits no edema. He exhibits no tenderness.  Neurological: He is alert and oriented.  Right hemifacial weakness.  Mild dysphonia with moderate dysarthria  Has good awareness and insight into deficits.  LUE/LLE: Antigravity strength  RUE: 0/5 prox to distal. RLE: 1/5 HE and KE, distally 0/5.   Sensation diminished to light touch and right upper and right lower  extremities. Skin: Skin is warm and dry.  Psychiatric: He has a normal mood and affect. His behavior is normal. Judgment and thought content normal.   Assessment/Plan: 1. 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 Physiatrist is providing close team supervision and 24 hour management of active medical problems listed below. Physiatrist and rehab team continue to assess barriers to discharge/monitor patient progress toward functional and medical goals.  Function:  Bathing Bathing position   Position: Shower  Bathing parts Body parts bathed by patient: Right arm, Chest, Abdomen, Front perineal area, Right upper leg, Left upper leg Body parts bathed by helper: Right lower leg, Left lower leg, Buttocks, Back, Left arm  Bathing assist Assist Level: Touching or steadying assistance(Pt > 75%)      Upper Body Dressing/Undressing Upper body dressing   What is the patient wearing?: Pull over shirt/dress     Pull over shirt/dress - Perfomed by patient: Put head through opening, Thread/unthread left sleeve Pull over shirt/dress - Perfomed by helper: Thread/unthread right sleeve, Pull shirt over trunk        Upper body assist Assist Level: Touching or steadying assistance(Pt > 75%)      Lower Body Dressing/Undressing Lower body dressing   What is the patient wearing?: Pants, Non-skid slipper socks   Underwear - Performed by helper: Thread/unthread right  underwear leg, Thread/unthread left underwear leg, Pull underwear up/down Pants- Performed by patient: Thread/unthread left pants leg Pants- Performed by helper: Thread/unthread right pants leg, Pull pants up/down   Non-skid slipper socks- Performed by helper: Don/doff right  sock, Don/doff left sock   Socks - Performed by helper: Don/doff right sock, Don/doff left sock   Shoes - Performed by helper: Don/doff right shoe, Don/doff left shoe, Fasten right, Fasten left          Lower body assist Assist for lower body dressing: 2 Designer, multimediaHelpers      Toileting Toileting Toileting activity did not occur: Safety/medical concerns   Toileting steps completed by helper: Adjust clothing prior to toileting, Performs perineal hygiene, Adjust clothing after toileting    Toileting assist Assist level:  (Stedy)   Transfers Chair/bed transfer   Chair/bed transfer method: Stand pivot Chair/bed transfer assist level: Touching or steadying assistance (Pt > 75%) Chair/bed transfer assistive device: Sliding board Mechanical lift: Landscape architecttedy   Locomotion Ambulation     Max distance: 20 Assist level: 2 helpers   Wheelchair   Type: Manual Max wheelchair distance: 100 Assist Level: Touching or steadying assistance (Pt > 75%)  Cognition Comprehension Comprehension assist level: Understands complex 90% of the time/cues 10% of the time  Expression Expression assist level: Expresses basic 75 - 89% of the time/requires cueing 10 - 24% of the time. Needs helper to occlude trach/needs to repeat words.  Social Interaction Social Interaction assist level: Interacts appropriately 90% of the time - Needs monitoring or encouragement for participation or interaction.  Problem Solving Problem solving assist level: Solves basic 90% of the time/requires cueing < 10% of the time  Memory Memory assist level: Recognizes or recalls 75 - 89% of the time/requires cueing 10 - 24% of the time   Medical Problem List and Plan: 1. 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 DVT Prophylaxis/Anticoagulation: Mechanical: Sequential compression devices, below knee Bilateral lower extremities 3. Pain  Management: Tylenol prn.  4. Mood: LCSW to follow for evaluation and support.  5. Neuropsych: This patient is capable of making decisions on hid own behalf. 6. Skin/Wound Care: Routine pressure relief measures.  7. Fluids/Electrolytes/Nutrition: Monitor I/O.   -Electrolytes WNL on 12/22  -Labs ordered for Monday 8. HTN: Monitor BP tid with permissive HTN and gradual normalization over 5 days. Continue norvasc and coreg daily.  -Norvasc increased to 10MG  on 12/24  -Coreg increased to 12.5 on 12/26  -within acceptable range at present, will consider further increase if necessary 9. Dyslipidemia: Cotinue Lipitor daily.  10. Neuropathic pain:   -Gabapentin increased to 600 to 3 times a day on 12/30  -Will not increase today due to recent increase, will consider increase tomorrow 11. Constipation  - Patient was last BM on 12/30  LOS (Days) 10 A FACE TO FACE EVALUATION WAS PERFORMED  Ankit Karis Jubanil Patel 10/18/2015 7:58 AM

## 2015-10-18 NOTE — Progress Notes (Signed)
Occupational Therapy Session Note  Patient Details  Name: Sean Wilkerson MRN: [id number] Date of Birth: 1946-07-20  Today's Date: 10/18/2015 OT Individual Time: 1000-1100 OT Individual Time Calculation (min): 60 min    Short Term Goals: Week 1:  OT Short Term Goal 1 (Week 1): Pt will dress UB with mod A using hemi technique OT Short Term Goal 1 - Progress (Week 1): Met OT Short Term Goal 2 (Week 1): Pt will complete toilet transfer with max A +1 OT Short Term Goal 2 - Progress (Week 1): Progressing toward goal OT Short Term Goal 3 (Week 1): Pt will display carry over of hemi dressing technique with min VCs OT Short Term Goal 3 - Progress (Week 1): Met OT Short Term Goal 4 (Week 1): Pt will maintain static standing balance with min A in prep for LB dressing task OT Short Term Goal 4 - Progress (Week 1): Met OT Short Term Goal 5 (Week 1): Pt will utilize L UE during functional task with max A OT Short Term Goal 5 - Progress (Week 1): Progressing toward goal  Skilled Therapeutic Interventions/Progress Updates:    Pt seen for skilled OT to facilitate balance and functional mobility with self care training. Upon entering the room, pt anxious to use restroom, he stated he had been waiting for A. Pt taken to bathroom to transfer to [facility] over toilet. Pt anxious to get there quickly and pulled himself to the L using his L arm but R foot slipped forward. Pt got on toilet, but educated pt need for safe foot placement and he was putting himself at risk for fall with sudden movement. Unfortuneatley, pt's brief soiled with large amount of loose stool. Pt was able to go further on toilet. Transferred back to w/c using left hand on grab bar with max A and max facilitation to keep trunk forward. Discussed safe transfer techniques and pt able to complete safer more controlled transfer onto tub bench with squat pivot. Pt does have a strong R lean. Cued pt to use vertical bar in shower as a reference point to  maintain midline in standing. Pt stood 2x in shower, standing up with min A by pulling self up with bar and mod A to maintain balance due to weak RLE. Pt did hold midline position very effectively. Used bath mit on R hand for hand over hand guidance to wash legs, no active movement observed or felt.  Pt dressed from w/c with chair at side of bed so he could use L bed rail to support self in standing with mod A to allow therapist to pull briefs and pants over hips.  Good focus with adaptive UE dressing, following cues for techniques.   Pt instructed in self ROM to RUE and self retrograde massage to R hand. Pt able to return demonstrate exercises well. Pt in chair with all needs met and call light in reach.  Therapy Documentation Precautions:  Precautions Precautions: Fall Restrictions Weight Bearing Restrictions: No   Pain: Pain Assessment Pain Assessment: No/denies pain ADL:  See Function Navigator for Current Functional Status.   Therapy/Group: Individual Therapy  [place] 10/18/2015, 1:55 PM

## 2015-10-18 NOTE — Progress Notes (Signed)
Speech Language Pathology Daily Session Note  Patient Details  Name: Sean BirchwoodLarry Lee Wilkerson MRN: [id number] Date of Birth: [birth year]  Today's Date: 10/18/2015 SLP Individual Time: [id number] SLP Individual Time Calculation (min): 55 min  Short Term Goals: Week 2: SLP Short Term Goal 1 (Week 2): Patient will consume current diet with minimal overt s/s of aspiration with supervision verbal cues for use of small bites/sips via cup.  SLP Short Term Goal 2 (Week 2): Patient will utilize speech intelligibility strategies at the phrase level with supervision verbal cues to self-monitor and correct errors.  SLP Short Term Goal 3 (Week 2): Patient will utilize diaphragmatic breathing at the word level with Min A verbal cues for accuracy and to self-monitor and correct errors.   Skilled Therapeutic Interventions: Skilled treatment session focused on speech goals. SLP facilitated session by providing Mod-Max A verbal, visual and tactile cues for accuracy with use of IMST device. Patient performed IMST exercises at 9 cm H2O for 5 sets of 5 reps with a self-perceived effort level of 5/10, however, patient reported feeling "funny" and dizzy after each set and required extended rest breaks. EMST device was not introduced during this session due to difficulty and fatigue with IMST device. SLP also facilitated session by providing Min A verbal cues for use of speech intelligibility strategies at the phrase level and Mod A verbal cues at the sentence level during a functional conversation. Patient left upright in wheelchair with all needs within reach. Continue with current plan of care.    Function:  Cognition Comprehension Comprehension assist level: Understands complex 90% of the time/cues 10% of the time  Expression   Expression assist level: Expresses basic 75 - 89% of the time/requires cueing 10 - 24% of the time. Needs helper to occlude trach/needs to repeat words.  Social Interaction Social Interaction  assist level: Interacts appropriately 90% of the time - Needs monitoring or encouragement for participation or interaction.  Problem Solving Problem solving assist level: Solves basic 90% of the time/requires cueing < 10% of the time  Memory Memory assist level: Recognizes or recalls 75 - 89% of the time/requires cueing 10 - 24% of the time    Pain Pain Assessment Pain Assessment: No/denies pain  Therapy/Group: Individual Therapy  Sean Wilkerson 10/18/2015, 9:52 AM

## 2015-10-19 ENCOUNTER — Inpatient Hospital Stay (HOSPITAL_COMMUNITY): Payer: Medicare Other | Admitting: Physical Therapy

## 2015-10-19 MED ORDER — GABAPENTIN 800 MG PO TABS: 800.0000 mg | ORAL_TABLET | Freq: Three times a day (TID) | ORAL | Status: DC

## 2015-10-19 MED ORDER — GABAPENTIN 600 MG PO TABS
1200.0000 mg | ORAL_TABLET | Freq: Three times a day (TID) | ORAL | Status: DC
  Administered 2015-10-19 – 2015-10-22 (×11): 1200 mg via ORAL
  Filled 2015-10-19 (×12): qty 2

## 2015-10-19 NOTE — Progress Notes (Signed)
Physical Therapy Session Note  Patient Details  Name: Sean Wilkerson MRN: [id number] Date of Birth: March 31, 1946  Today's Date: 10/19/2015 PT Individual Time: 1430-1530 PT Individual Time Calculation (min): 60 min   Short Term Goals: Week 1:  PT Short Term Goal 1 (Week 1): Patient will transfer bed <> wheelchair with assist of one person. PT Short Term Goal 1 - Progress (Week 1): Met PT Short Term Goal 2 (Week 1): Patient will perform sit <> stand with consistent mod A.  PT Short Term Goal 2 - Progress (Week 1): Met PT Short Term Goal 3 (Week 1): Patient will ambulate 25 ft with assist of one person. PT Short Term Goal 3 - Progress (Week 1): Partly met PT Short Term Goal 4 (Week 1): Patient will perform stair negotiation with assist of 2.  PT Short Term Goal 4 - Progress (Week 1): Met PT Short Term Goal 5 (Week 1): Patient will propel wheelchair via L hemi technique x 50 ft with supervision. PT Short Term Goal 5 - Progress (Week 1): Met Week 2:  PT Short Term Goal 1 (Week 2): Patient will transfer bed <> wheelchair with min assist of one person. PT Short Term Goal 2 (Week 2): Patient will perform sit <> stand with consistent min A.  PT Short Term Goal 3 (Week 2): Patient will ambulate 35 ft with max assist of one person. PT Short Term Goal 4 (Week 2): Patient will perform stair negotiation of 4 steps with max assist. PT Short Term Goal 5 (Week 2): Patient will propel wheelchair via L hemi technique x 100 ft with supervision.  Skilled Therapeutic Interventions/Progress Updates:   Pt able to performs shoulder and hip though still limited movements in gravity eliminated planes. Pt demonstrates improvement with transfers as session progresses requiring decreased assistance when cued for weight shift and core activation. Pt would continue to benefit from skilled PT services to increase functional mobility. Therapy Documentation Precautions:  Precautions Precautions:  Fall Restrictions Weight Bearing Restrictions: No Vital Signs: Therapy Vitals Temp: 99 F (37.2 C) Temp Source: Oral Pulse Rate: 68 Resp: 18 BP: (!) 152/72 mmHg Patient Position (if appropriate): Sitting Oxygen Therapy SpO2: 98 % O2 Device: Not Delivered Pain: Pain Assessment Pain Assessment: 0-10 Pain Score: 4  Pain Location: Generalized Pain Orientation: Right Mobility:  Pt performs transfers with Max A with cues for weight shift, sequencing, core activation, and technique. Other Treatments:  Pt educated on rehab plan, core stability, progressing mobility, forced use, and safety in mobility. Scap mobs, T/S ext soft tissue stretch, L/S soft tissue stretch into ext and lateral L/S flexion. Pt performs anterior weight shifts 2x10. Pt performs RUE forced use with LUE reaching superior and lateral 4x10 with facilitation of R lateral L/S flexors. Pt performs static standing 1'x4. Pt performs LLE advancement pre gait x10. Transfers x10 in session. Sidelying PNF for pelvic anterior elevation with some LE involved too. Pt performs standing weight shifts B/L 2x10.  See Function Navigator for Current Functional Status.   Therapy/Group: Individual Therapy  Monia Pouch 10/19/2015, 2:44 PM

## 2015-10-19 NOTE — Progress Notes (Signed)
Sean Wilkerson PHYSICAL MEDICINE & REHABILITATION     PROGRESS NOTE  Subjective/Complaints: Patient seen up in bed this morning. He notes neuropathic pain along his entire right side of his body. He does note improvement with the initiation and increase in medication, however the pain is still present.  ROS: + Right sided pain. Denies CP, SOB, N/V/D   Objective: Vital Signs: Blood pressure 140/69, pulse 71, temperature 98.3 F (36.8 C), temperature source Oral, resp. rate 18, weight 113.5 kg (250 lb 3.6 oz), SpO2 100 %. No results found. No results for input(s): WBC, HGB, HCT, PLT in the last 72 hours. No results for input(s): NA, K, CL, GLUCOSE, BUN, CREATININE, CALCIUM in the last 72 hours.  Invalid input(s): CO CBG (last 3)  No results for input(s): GLUCAP in the last 72 hours.  Wt Readings from Last 3 Encounters:  10/15/15 113.5 kg (250 lb 3.6 oz)  10/05/15 115.5 kg (254 lb 10.1 oz)    Physical Exam:  BP 140/69 mmHg  Pulse 71  Temp(Src) 98.3 F (36.8 C) (Oral)  Resp 18  Wt 113.5 kg (250 lb 3.6 oz)  SpO2 100% Constitutional: He appears well-developed and well-nourished. NAD. Vital signs reviewed HENT: Normocephalic and atraumatic.  Mouth/Throat: Oropharynx is pink, clear and moist.  Eyes: Conjunctivae and EOM are normal. Right eye exhibits no discharge. Left eye exhibits no discharge.  Cardiovascular: Normal rate and regular rhythm.No murmur heard. Respiratory: Effort normal and breath sounds normal. No respiratory distress. He has no wheezes.  GI: Soft. Bowel sounds are normal. He exhibits no distension. There is no tenderness.  Musculoskeletal: He exhibits no edema. He exhibits no tenderness.  Neurological: He is alert and oriented.  Right hemifacial weakness.  Mild dysphonia with moderate dysarthria  Has good awareness and insight into deficits.  LUE/LLE: Antigravity strength  RUE: 0/5 prox to distal. RLE: 1/5 HE and KE, distally 0/5.  Sensation diminished to  light touch and right upper and right lower  Extremities (stable). Skin: Skin is warm and dry.  Psychiatric: He has a normal mood and affect. His behavior is normal. Judgment and thought content normal.   Assessment/Plan: 1. 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 Physiatrist is providing close team supervision and 24 hour management of active medical problems listed below. Physiatrist and rehab team continue to assess barriers to discharge/monitor patient progress toward functional and medical goals.  Function:  Bathing Bathing position   Position: Shower  Bathing parts Body parts bathed by patient: Right arm, Chest, Abdomen, Front perineal area, Right upper leg, Left upper leg Body parts bathed by helper: Right lower leg, Left lower leg, Buttocks, Back, Left arm  Bathing assist Assist Level: Touching or steadying assistance(Pt > 75%)      Upper Body Dressing/Undressing Upper body dressing   What is the patient wearing?: Pull over shirt/dress     Pull over shirt/dress - Perfomed by patient: Put head through opening, Thread/unthread left sleeve Pull over shirt/dress - Perfomed by helper: Thread/unthread right sleeve, Pull shirt over trunk        Upper body assist Assist Level: Touching or steadying assistance(Pt > 75%)      Lower Body Dressing/Undressing Lower body dressing   What is the patient wearing?: Pants, Non-skid slipper socks   Underwear - Performed by helper: Thread/unthread right underwear leg, Thread/unthread left underwear leg,  Pull underwear up/down Pants- Performed by patient: Thread/unthread left pants leg Pants- Performed by helper: Thread/unthread right pants leg, Pull pants up/down   Non-skid slipper socks- Performed by helper: Don/doff right sock, Don/doff  left sock   Socks - Performed by helper: Don/doff right sock, Don/doff left sock   Shoes - Performed by helper: Don/doff right shoe, Don/doff left shoe, Fasten right, Fasten left          Lower body assist Assist for lower body dressing: Touching or steadying assistance (Pt > 75%)      Toileting Toileting Toileting activity did not occur: Safety/medical concerns   Toileting steps completed by helper: Adjust clothing prior to toileting, Performs perineal hygiene, Adjust clothing after toileting Toileting Assistive Devices: Grab bar or rail  Toileting assist Assist level: Touching or steadying assistance (Pt.75%)   Transfers Chair/bed transfer   Chair/bed transfer method: Stand pivot Chair/bed transfer assist level: Touching or steadying assistance (Pt > 75%) Chair/bed transfer assistive device: Sliding board Mechanical lift: Landscape architect     Max distance: 20 Assist level: 2 helpers   Wheelchair   Type: Manual Max wheelchair distance: 100 Assist Level: Touching or steadying assistance (Pt > 75%)  Cognition Comprehension Comprehension assist level: Understands complex 90% of the time/cues 10% of the time  Expression Expression assist level: Expresses basic 75 - 89% of the time/requires cueing 10 - 24% of the time. Needs helper to occlude trach/needs to repeat words.  Social Interaction Social Interaction assist level: Interacts appropriately 90% of the time - Needs monitoring or encouragement for participation or interaction.  Problem Solving Problem solving assist level: Solves basic 90% of the time/requires cueing < 10% of the time  Memory Memory assist level: Recognizes or recalls 75 - 89% of the time/requires cueing 10 - 24% of the time   Medical Problem List and Plan: 1. 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 DVT Prophylaxis/Anticoagulation:  Mechanical: Sequential compression devices, below knee Bilateral lower extremities 3. Pain Management: Tylenol prn.  4. Mood: LCSW to follow for evaluation and support.  5. Neuropsych: This patient is capable of making decisions on hid own behalf. 6. Skin/Wound Care: Routine pressure relief measures.  7. Fluids/Electrolytes/Nutrition: Monitor I/O.   -Electrolytes WNL on 12/22  -Labs ordered for Monday 8. HTN: Monitor BP tid with permissive HTN and gradual normalization over 5 days. Continue norvasc and coreg daily.  -Norvasc increased to 10MG  on 12/24  -Coreg increased to 12.5 on 12/26  -within acceptable range at present, will consider further increase if necessary 9. Dyslipidemia: Cotinue Lipitor daily.  10. Neuropathic pain:   -Gabapentin increased to 1200 3 times a day on 1/1 11. Constipation  - Patient was last BM on 12/31  LOS (Days) 11 A FACE TO FACE EVALUATION WAS PERFORMED  Sean Wilkerson 10/19/2015 8:38 AM

## 2015-10-20 ENCOUNTER — Inpatient Hospital Stay (HOSPITAL_COMMUNITY): Payer: Medicare Other | Admitting: Occupational Therapy

## 2015-10-20 ENCOUNTER — Inpatient Hospital Stay (HOSPITAL_COMMUNITY): Payer: Medicare Other | Admitting: Physical Therapy

## 2015-10-20 ENCOUNTER — Inpatient Hospital Stay (HOSPITAL_COMMUNITY): Payer: Medicare Other | Admitting: Speech Pathology

## 2015-10-20 ENCOUNTER — Inpatient Hospital Stay (HOSPITAL_COMMUNITY): Payer: Medicare Other

## 2015-10-20 MED ORDER — CARVEDILOL 25 MG PO TABS
25.0000 mg | ORAL_TABLET | Freq: Two times a day (BID) | ORAL | Status: DC
  Administered 2015-10-20 – 2015-10-30 (×21): 25 mg via ORAL
  Filled 2015-10-20 (×21): qty 1

## 2015-10-20 NOTE — Progress Notes (Signed)
Recreational Therapy Assessment and Plan  Patient Details  Name: Sean Wilkerson MRN: [id number] Date of Birth: June 09, 1946 Today's Date: 10/20/2015  Rehab Potential: Good ELOS: 10 days   Assessment Clinical Impression:  Problem List:  Patient Active Problem List   Diagnosis Date Noted  . Stroke, acute, thrombotic ([place]) 10/08/2015  . Hemiparesis, aphasia, and dysphagia as late effect of cerebrovascular accident (CVA) ([place]) 10/08/2015  . Essential hypertension 10/08/2015  . HLD (hyperlipidemia) 10/08/2015  . Hemiplegia and hemiparesis following cerebral infarction affecting right dominant side ([place])   . Right hemiplegia ([place]) 10/06/2015  . Stroke (cerebrum) ([place]) 10/05/2015  . CVA (cerebral infarction) 10/05/2015    Past Medical History:  Past Medical History  Diagnosis Date  . Hypertension   . HTN (hypertension) 10/08/2015  . HLD (hyperlipidemia) 10/08/2015   Past Surgical History:  Past Surgical History  Procedure Laterality Date  . No past surgeries      Assessment & Plan Clinical Impression: Sean Wilkerson is a 70 y.o. left handed male with history of HTN, recent syncopal episode (being evaluated with cardiac monitor) who was admitted on 10/05/15 with right sided weakness and slurred speech. CT head negative and he was treated with tPA for NIHSS 7. 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 2D echo with EF 55-60% with no wall abnormality. Carotid dopplers without ICA stenosis.Dr. Leonie Man felt that patient had left brain subcortical stroke due to small vessel disease and to continue ASA for secondary stroke prevention. Swallow evaluation done yesterday and patient started on regular diet, thin liquids with  aspiration precautions for mild pharyngeal dysphagia. Patient with resultant right hemiparesis, moderate to severe dysarthria and dysphagia. Therapy evaluations done yesterday and CIR recommended for follow up therapy. Patient transferred to CIR on 10/08/2015.      Pt presents with decreased activity tolerance, decreased functional mobility, decreased balance, dysarthria Limiting pt's independence with leisure/community pursuits.  Leisure History/Participation Premorbid leisure interest/current participation: [employer];Community - Travel (Comment) Expression Interests: Music (Comment) Other Leisure Interests: Reading;Cooking/Baking Leisure Participation Style: Alone;With Family/Friends Awareness of Community Resources: Excellent Psychosocial / Spiritual Spiritual Interests: Church;Wom[employer] Groups Social interaction - Mood/Behavior: Cooperative Engineer, drilling for Education?: Yes Recreational Therapy Orientation Orientation -Reviewed with patient: Available activity resources Strengths/Weaknesses Patient Strengths/Abilities: Willingness to participate;Active premorbidly Patient weaknesses: Physical limitations TR Patient demonstrates impairments in the following area(s): Endurance;Motor;Safety  Plan Rec Therapy Plan Is patient appropriate for Therapeutic Recreation?: Yes Rehab Potential: Good Treatment times per week: Min 1 time per week >20 minutes Estimated Length of Stay: 10 days TR Treatment/Interventions: Adaptive equipment instruction;1:1 session;Balance/vestibular training;Functional mobility training;Community reintegration;Leisure education;Patient/family education;Therapeutic activities;Recreation/leisure participation;Therapeutic exercise;UE/LE Coordination activities;Wheelchair propulsion/positioning  Recommendations for other services: Neuropsych  Discharge Criteria: Patient will be discharged from TR if  patient refuses treatment 3 consecutive times without medical reason.  If treatment goals not met, if there is a change in medical status, if patient makes no progress towards goals or if patient is discharged from hospital.  The above assessment, treatment plan, treatment alternatives and goals were discussed and mutually agreed upon: by patient  [place] 10/20/2015, 3:29 PM

## 2015-10-20 NOTE — Progress Notes (Signed)
Occupational Therapy Session Note  Patient Details  Name: Sean BirchwoodLarry Lee Wilkerson MRN: [id number] Date of Birth: 07-01-1946  Today's Date: 10/20/2015 OT Individual Time: 1400-1430 OT Individual Time Calculation (min): 30 min    Skilled Therapeutic Interventions/Progress Updates:    Performed NMES on the right shoulder during session for 20 mins without any adverse reactions. Electrodes place on supraspinatus and posterior deltoid. On time 10 seconds, off time 20 seconds. Rate 35pps with 1 second ramp up. Intensity set at level 24 increasing to 26 last 10 mins.  Noted activation of medial deltoid during stimulation.  While stimulation was active, educated pt on self PROM exercises for the right hand.  Pt left in wheelchair at end of session with call button and phone within reach.    Therapy Documentation Precautions:  Precautions Precautions: Fall Restrictions Weight Bearing Restrictions: No  Pain: Pain Assessment Pain Assessment: No/denies pain ADL: See Function Navigator for Current Functional Status.   Therapy/Group: Individual Therapy  Lizzeth Meder OTR/L 10/20/2015, 2:55 PM

## 2015-10-20 NOTE — Progress Notes (Signed)
[facility] PHYSICAL MEDICINE & REHABILITATION     PROGRESS NOTE  Subjective/Complaints: Patient seen this morning sitting up in bed. He notes he continues to have right-sided pain that he describes as sharp and pins and needles. It is a 4/10 now, but was in 8/10 before.  ROS: + Right sided pain. Denies CP, SOB, N/V/D   Objective: Vital Signs: Blood pressure 149/91, pulse 71, temperature 98.4 F (36.9 C), temperature source Oral, resp. rate 18, weight 113.5 kg (250 lb 3.6 oz), SpO2 98 %. No results found. No results for input(s): WBC, HGB, HCT, PLT in the last 72 hours. No results for input(s): NA, K, CL, GLUCOSE, BUN, CREATININE, CALCIUM in the last 72 hours.  Invalid input(s): CO CBG (last 3)  No results for input(s): GLUCAP in the last 72 hours.  Wt Readings from Last 3 Encounters:  10/15/15 113.5 kg (250 lb 3.6 oz)  10/05/15 115.5 kg (254 lb 10.1 oz)    Physical Exam:  BP 149/91 mmHg  Pulse 71  Temp(Src) 98.4 F (36.9 C) (Oral)  Resp 18  Wt 113.5 kg (250 lb 3.6 oz)  SpO2 98% Constitutional: He appears well-developed and well-nourished. NAD. Vital signs reviewed HENT: Normocephalic and atraumatic.  Mouth/Throat: Oropharynx is pink, clear and moist.  Eyes: Conjunctivae and EOM are normal. Right eye exhibits no discharge. Left eye exhibits no discharge.  Cardiovascular: Normal rate and regular rhythm.No murmur heard. Respiratory: Effort normal and breath sounds normal. No respiratory distress. He has no wheezes.  GI: Soft. Bowel sounds are normal. He exhibits no distension. There is no tenderness.  Musculoskeletal: He exhibits no edema. He exhibits no tenderness.  Neurological: He is alert and oriented.  Right hemifacial weakness.  Mild dysphonia with moderate dysarthria  Has good awareness and insight into deficits.  LUE/LLE: 5/5 proximal to distal RUE: 0/5 prox to distal. RLE: 1/5 HE and KE, distally 0/5.  RUE: 0/5 prox to distal. RLE: 1/5 HE and KE, distally  0/5.  Sensation diminished to light touch and right upper and right lower  Extremities (stable). Skin: Skin is warm and dry.  Psychiatric: He has a normal mood and affect. His behavior is normal. Judgment and thought content normal.   Assessment/Plan: 1. 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 Physiatrist is providing close team supervision and 24 hour management of active medical problems listed below. Physiatrist and rehab team continue to assess barriers to discharge/monitor patient progress toward functional and medical goals.  Function:  Bathing Bathing position   Position: Wheelchair/chair at sink  Bathing parts Body parts bathed by patient: Left arm, Chest, Abdomen Body parts bathed by helper: Right arm, Front perineal area, Buttocks, Right upper leg, Left upper leg, Right lower leg, Left lower leg, Back  Bathing assist Assist Level: Touching or steadying assistance(Pt > 75%)      Upper Body Dressing/Undressing Upper body dressing   What is the patient wearing?: Pull over shirt/dress     Pull over shirt/dress - Perfomed by patient: Thread/unthread left sleeve, Put head through opening Pull over shirt/dress - Perfomed by helper: Thread/unthread right sleeve, Pull shirt over trunk        Upper body assist Assist Level: Touching or steadying assistance(Pt > 75%)      Lower Body Dressing/Undressing Lower body dressing   What is the patient wearing?: Pants   Underwear -  Performed by helper: Thread/unthread right underwear leg, Thread/unthread left underwear leg, Pull underwear up/down Pants- Performed by patient: Thread/unthread left pants leg Pants- Performed by helper: Thread/unthread right pants leg, Thread/unthread left pants leg, Pull pants up/down   Non-skid slipper  socks- Performed by helper: Don/doff right sock, Don/doff left sock   Socks - Performed by helper: Don/doff right sock, Don/doff left sock   Shoes - Performed by helper: Don/doff right shoe, Don/doff left shoe, Fasten right, Fasten left          Lower body assist Assist for lower body dressing: 2 Designer, multimediaHelpers      Toileting Toileting Toileting activity did not occur: Safety/medical concerns   Toileting steps completed by helper: Adjust clothing prior to toileting, Performs perineal hygiene, Adjust clothing after toileting Toileting Assistive Devices: Grab bar or rail  Toileting assist Assist level: Two helpers   Transfers Chair/bed transfer   Chair/bed transfer method: Stand pivot Chair/bed transfer assist level: 2 helpers Chair/bed transfer assistive device: Sliding board Mechanical lift: Landscape architecttedy   Locomotion Ambulation     Max distance: 20 Assist level: 2 helpers   Wheelchair   Type: Manual Max wheelchair distance: 100 Assist Level: Touching or steadying assistance (Pt > 75%)  Cognition Comprehension Comprehension assist level: Understands complex 90% of the time/cues 10% of the time  Expression Expression assist level: Expresses basic 75 - 89% of the time/requires cueing 10 - 24% of the time. Needs helper to occlude trach/needs to repeat words.  Social Interaction Social Interaction assist level: Interacts appropriately 90% of the time - Needs monitoring or encouragement for participation or interaction.  Problem Solving Problem solving assist level: Solves basic 90% of the time/requires cueing < 10% of the time  Memory Memory assist level: Recognizes or recalls 75 - 89% of the time/requires cueing 10 - 24% of the time   Medical Problem List and Plan: 1. 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 DVT Prophylaxis/Anticoagulation: Mechanical: Sequential compression  devices, below knee Bilateral lower extremities 3. Pain Management: Tylenol prn.  4. Mood: LCSW to follow for evaluation and support.  5. Neuropsych: This patient is capable of making decisions on hid own behalf. 6. Skin/Wound Care: Routine pressure relief measures.  7. Fluids/Electrolytes/Nutrition: Monitor I/O.   -Electrolytes WNL on 12/22  -Labs ordered 8. HTN: Monitor BP tid with permissive HTN and gradual normalization over 5 days. Continue norvasc and coreg daily.  -Norvasc increased to 10MG  on 12/24  -Coreg increased to 25 on 1/2 9. Dyslipidemia: Cotinue Lipitor daily.  10. Neuropathic pain:   -Gabapentin increased to 1200 3 times a day on 1/1  -Will continue current dose and consider change in medication if pain relief is not adequate. 11. Constipation  -Improved  LOS (Days) 12 A FACE TO FACE EVALUATION WAS PERFORMED  Ankit Karis Jubanil Patel 10/20/2015 8:33 AM

## 2015-10-20 NOTE — Progress Notes (Signed)
Occupational Therapy Session Note  Patient Details  Name: Sean BirchwoodLarry Lee Denunzio MRN: [id number] Date of Birth: 1946/10/05  Today's Date: 10/20/2015 OT Individual Time: 0900-1030 OT Individual Time Calculation (min): 90 min    Short Term Goals: Week 2:  OT Short Term Goal 1 (Week 2): Pt will complete toilet transfer with max A + 1 OT Short Term Goal 2 (Week 2): Pt will complete toileting tasks with max A OT Short Term Goal 3 (Week 2): Pt will utilize LUE as stabilizer during functional tasks with max A OT Short Term Goal 4 (Week 2): Pt will complete LB dressing tasks with max A OT Short Term Goal 5 (Week 2): Pt will complete UB dressing tasks with min A  Skilled Therapeutic Interventions/Progress Updates:    Pt engaged in BADL retraining including toilet/shower transfers, toileting, bathing at shower level and dressing with sit<>stand from w/c at sink.  Pt continues to require mod verbal cues for sequencing and positioning during functional transfers. Pt required mod A for functional transfers using grab bar to pull up on.  Pt required max multimodal cues for hemi bathing techniques.  Pt required min A for sitting balance on shower seat.  Pt recalled hemi dressing techniques but required assistance to complete tasks.  Pt required min A for standing balance when standing in shower and at sink.  Pt's RUE flaccid.  Pt requires max verbal cues and max A for repositioning.  Focus on functional transfers, sit<>stand, standing balance, hemi bathing and dressing techniques, and safety awareness to increase independence with BADLs.   Therapy Documentation Precautions:  Precautions Precautions: Fall Restrictions Weight Bearing Restrictions: No Pain:  Pt denied pain  See Function Navigator for Current Functional Status.   Therapy/Group: Individual Therapy  Rich BraveLanier, Harrel Ferrone Chappell 10/20/2015, 10:31 AM

## 2015-10-20 NOTE — Progress Notes (Signed)
Physical Therapy Session Note  Patient Details  Name: Sean Wilkerson MRN: [id number] Date of Birth: January 29, 1946  Today's Date: 10/20/2015 PT Individual Time: 3736-6815 PT Individual Time Calculation (min): 44 min   Short Term Goals: Week 2:  PT Short Term Goal 1 (Week 2): Patient will transfer bed <> wheelchair with min assist of one person. PT Short Term Goal 2 (Week 2): Patient will perform sit <> stand with consistent min A.  PT Short Term Goal 3 (Week 2): Patient will ambulate 35 ft with max assist of one person. PT Short Term Goal 4 (Week 2): Patient will perform stair negotiation of 4 steps with max assist. PT Short Term Goal 5 (Week 2): Patient will propel wheelchair via L hemi technique x 100 ft with supervision.   Therapy Documentation Precautions:  Precautions Precautions: Fall Restrictions Weight Bearing Restrictions: No     Bed Mobility Rolling to right side lying min assist Rolling to left side lying mod assist   Right side lying to shot sit edge of bed mod assist  Squat pivot transfer with minimum to moderate assist. Block practice squat pivot transfer to and from wheelchair and couch 5x.    Patient stood three trials of 2 minutes, 2 minutes and 3 minutes without assistive device moderate assistance. Promote upright posture, positioning in midline and even weight distribution.  Patient propelled wheelchair 135 feetx2 with increased time with close supervision assistance and verbal cues for proper sequence and technique. Min assist required tight spaces, during turns and obstacle negotiation.    Patient reports occasional pain in right shoulder no pain reported during session. Patient tolerated session with rest breaks throughout. Patient returned to room at end of session with all needs met resting comfortably in chair at bedside.   See Function Navigator for Current Functional Status.   Therapy/Group: Individual Therapy  Retta Diones 10/20/2015,  9:47 AM

## 2015-10-20 NOTE — Progress Notes (Signed)
Speech Language Pathology Daily Session Note  Patient Details  Name: Sean BirchwoodLarry Lee Wilkerson MRN: [id number] Date of Birth: 1946-07-03  Today's Date: 10/20/2015 SLP Individual Time: 1300-1355 SLP Individual Time Calculation (min): 55 min  Short Term Goals: Week 2: SLP Short Term Goal 1 (Week 2): Patient will consume current diet with minimal overt s/s of aspiration with supervision verbal cues for use of small bites/sips via cup.  SLP Short Term Goal 2 (Week 2): Patient will utilize speech intelligibility strategies at the phrase level with supervision verbal cues to self-monitor and correct errors.  SLP Short Term Goal 3 (Week 2): Patient will utilize diaphragmatic breathing at the word level with Min A verbal cues for accuracy and to self-monitor and correct errors.   Skilled Therapeutic Interventions: Skilled treatment session focused on speech goals. SLP facilitated session by providing Mod-Max A verbal, visual and tactile cues for accuracy with use of IMST device. Patient performed IMST exercises at 9 cm H2O for 5 sets of 5 reps with a self-perceived effort level of 8/10, however, patient reported feeling "funny" and dizzy after each set and required extended rest breaks. EMST device was not introduced during this session due to difficulty and fatigue with IMST device. SLP also facilitated session by providing Min A verbal cues for use of speech intelligibility strategies and Mod A verbal cues for diaphragmatic breathing at the phrase and sentence level. Patient left upright in wheelchair with all needs within reach. Continue with current plan of care.   Function:  Cognition Comprehension Comprehension assist level: Understands complex 90% of the time/cues 10% of the time  Expression   Expression assist level: Expresses basic 75 - 89% of the time/requires cueing 10 - 24% of the time. Needs helper to occlude trach/needs to repeat words.  Social Interaction Social Interaction assist level:  Interacts appropriately 90% of the time - Needs monitoring or encouragement for participation or interaction.  Problem Solving Problem solving assist level: Solves basic 90% of the time/requires cueing < 10% of the time  Memory Memory assist level: Recognizes or recalls 75 - 89% of the time/requires cueing 10 - 24% of the time    Pain Pain Assessment Pain Assessment: No/denies pain  Therapy/Group: Individual Therapy  Grace Haggart 10/20/2015, 4:08 PM

## 2015-10-21 ENCOUNTER — Encounter (HOSPITAL_COMMUNITY): Payer: Medicare Other

## 2015-10-21 ENCOUNTER — Inpatient Hospital Stay (HOSPITAL_COMMUNITY): Payer: Medicare Other | Admitting: Speech Pathology

## 2015-10-21 ENCOUNTER — Inpatient Hospital Stay (HOSPITAL_COMMUNITY): Payer: Medicare Other | Admitting: *Deleted

## 2015-10-21 ENCOUNTER — Inpatient Hospital Stay (HOSPITAL_COMMUNITY): Payer: Medicare Other | Admitting: Occupational Therapy

## 2015-10-21 LAB — CBC WITH DIFFERENTIAL/PLATELET
BASOS ABS: 0 10*3/uL (ref 0.0–0.1)
Basophils Relative: 0 %
EOS ABS: 0.2 10*3/uL (ref 0.0–0.7)
Eosinophils Relative: 5 %
HEMATOCRIT: 35.2 % — AB (ref 39.0–52.0)
HEMOGLOBIN: 12.5 g/dL — AB (ref 13.0–17.0)
LYMPHS PCT: 32 %
Lymphs Abs: 1.6 10*3/uL (ref 0.7–4.0)
MCH: 34 pg (ref 26.0–34.0)
MCHC: 35.5 g/dL (ref 30.0–36.0)
MCV: 95.7 fL (ref 78.0–100.0)
Monocytes Absolute: 0.4 10*3/uL (ref 0.1–1.0)
Monocytes Relative: 9 %
NEUTROS PCT: 54 %
Neutro Abs: 2.7 10*3/uL (ref 1.7–7.7)
PLATELETS: 188 10*3/uL (ref 150–400)
RBC: 3.68 MIL/uL — ABNORMAL LOW (ref 4.22–5.81)
RDW: 11.9 % (ref 11.5–15.5)
WBC: 4.9 10*3/uL (ref 4.0–10.5)

## 2015-10-21 LAB — BASIC METABOLIC PANEL
Anion gap: 6 (ref 5–15)
BUN: 11 mg/dL (ref 6–20)
CO2: 27 mmol/L (ref 22–32)
CREATININE: 0.96 mg/dL (ref 0.61–1.24)
Calcium: 9.1 mg/dL (ref 8.9–10.3)
Chloride: 107 mmol/L (ref 101–111)
GFR calc Af Amer: >60 mL/min (ref >60)
GLUCOSE: 129 mg/dL — AB (ref 65–99)
POTASSIUM: 4.5 mmol/L (ref 3.5–5.1)
SODIUM: 140 mmol/L (ref 135–145)

## 2015-10-21 MED ORDER — NORTRIPTYLINE HCL 10 MG PO CAPS
10.0000 mg | ORAL_CAPSULE | Freq: Every day | ORAL | Status: DC
  Administered 2015-10-21 – 2015-10-29 (×9): 10 mg via ORAL
  Filled 2015-10-21 (×10): qty 1

## 2015-10-21 NOTE — Progress Notes (Signed)
Speech Language Pathology Daily Session Note  Patient Details  Name: Sean Wilkerson MRN: [id number] Date of Birth: 02-Aug-1946  Today's Date: 10/21/2015 SLP Individual Time: 1100-1200 SLP Individual Time Calculation (min): 60 min  Short Term Goals: Week 2: SLP Short Term Goal 1 (Week 2): Patient will consume current diet with minimal overt s/s of aspiration with supervision verbal cues for use of small bites/sips via cup.  SLP Short Term Goal 2 (Week 2): Patient will utilize speech intelligibility strategies at the phrase level with supervision verbal cues to self-monitor and correct errors.  SLP Short Term Goal 3 (Week 2): Patient will utilize diaphragmatic breathing at the word level with Min A verbal cues for accuracy and to self-monitor and correct errors.   Skilled Therapeutic Interventions: Skilled treatment session focused on speech goals. SLP facilitated session by providing Mod A verbal, visual and tactile cues for accuracy with use of IMST device. Patient performed IMST exercises at 9 cm H2O for 5 sets of 5 reps with a self-perceived effort level of 8/10. Patient also consumed trials of regular textures and demonstrated mildly prolonged mastication with moderate oral residue that patient cleared with a liquid wash.  Patient required Min A verbal cues for use of small bites and to reduce talking while masticating. Patient consumed lunch meal of Dys. 3 textures with thin liquids without overt s/s of aspiration and required Min A verbal cues for use of small bites/sips and a slow rate of self-feeding. Recommend patient continue current diet of Dys. 3 textures.  Patient was ~75% intelligible at the phrase and sentence level and required Min A verbal cues for use of an increased vocal intensity. Patient left upright in wheelchair with all needs within reach. Continue with current plan of care.   Function:  Eating Eating   Modified Consistency Diet: Yes Eating Assist Level: Set up assist  for;Supervision or verbal cues   Eating Set Up Assist For: Opening containers       Cognition Comprehension Comprehension assist level: Understands complex 90% of the time/cues 10% of the time  Expression   Expression assist level: Expresses basic 75 - 89% of the time/requires cueing 10 - 24% of the time. Needs helper to occlude trach/needs to repeat words.  Social Interaction Social Interaction assist level: Interacts appropriately 90% of the time - Needs monitoring or encouragement for participation or interaction.  Problem Solving Problem solving assist level: Solves basic 90% of the time/requires cueing < 10% of the time  Memory Memory assist level: Recognizes or recalls 75 - 89% of the time/requires cueing 10 - 24% of the time    Pain No/Denies Pain   Therapy/Group: Individual Therapy  Tenzin Edelman 10/21/2015, 3:55 PM

## 2015-10-21 NOTE — Progress Notes (Signed)
Physical Therapy Note  Patient Details  Name: Sean BirchwoodLarry Lee Wilkerson MRN: [id number] Date of Birth: 11/29/1945 Today's Date: 10/21/2015    Time: [phone] 55 minutes  2:1 Co-tx with Rec Therapist: Pt c/o sharp pains all down R UE and LE, RN aware, meds given prior to session, rests as needed.  W/c mobility with hemi techique in controlled environment 150', 100' with supervision, pt requires 1 rest break each trial due to fatigue.  Squat pivot transfers with mod A, min cuing for technique and sequencing.  [place], multiple stands in steady with reaching horseshoe task, pt requires manual facilitation and verbal/visual cues for midline posture and controlled movements, especially when fatigued. Pt able to contract R quad when reaching, difficult to contract R quad with verbal and tactile cues only.  Sit to stand from mat with mod A for balance and posture, focus on midline posture, decrease pushing with L LE and improved quad control. Pt fatigues easily but improves with repetition. Pt with 1 episode of feeling "light headed" during session.  BP 122/71 in seated.  Pt recovered with seated rest and deep breathing.   Hillarie Harrigan 10/21/2015, 10:10 AM

## 2015-10-21 NOTE — Progress Notes (Signed)
Recreational Therapy Session Note  Patient Details  Name: Sean BirchwoodLarry Lee Wilkerson MRN: [id number] Date of Birth: 1946/04/04 Today's Date: 10/21/2015  Pain: c/o R side pain, RN aware & premedicated Skilled Therapeutic Interventions/Progress Updates: Session focused on activity tolerance, w/c mobility, standing tolerance & balance, awareness of midline & breathing techniques during exercise.  Pt propelled w/c from room to gym with supervision, min cues for technique & rest break due to fatigue.  Pt performed squat pivot transfers with mod assist, min cues.  Pt performed sit-stands from perched position(steady) with mod assist and sit to stand from mat with mod assist.  While standing, pt reaching up and to the left to facilitate knee control on the right.  Pt reported feeling light headed during session, PT obtaining BP 122/71.  Pt required frequent rest breaks and multimodal cuing for diaphragmatic breathing throughout session.    Therapy/Group: Co-Treatment   Kylin Genna 10/21/2015, 3:41 PM

## 2015-10-21 NOTE — Progress Notes (Signed)
Occupational Therapy Session Note  Patient Details  Name: Sean Wilkerson MRN: [id number] Date of Birth: 29-Sep-1946  Today's Date: 10/21/2015 OT Individual Time: [id number] OT Individual Time Calculation (min): 72 min    Short Term Goals: Week 2:  OT Short Term Goal 1 (Week 2): Pt will complete toilet transfer with max A + 1 OT Short Term Goal 2 (Week 2): Pt will complete toileting tasks with max A OT Short Term Goal 3 (Week 2): Pt will utilize LUE as stabilizer during functional tasks with max A OT Short Term Goal 4 (Week 2): Pt will complete LB dressing tasks with max A OT Short Term Goal 5 (Week 2): Pt will complete UB dressing tasks with min A  Skilled Therapeutic Interventions/Progress Updates:    Pt engaged in BADL retraining including bathing at shower level and dressing with sit<>stand from w/c at sink.  Pt continues to require verbal cues for positioning and sequencing with transfers. Pt performed squat pivot transfer w/c<>tub transfer bench using grab bar with mod A.  Pt used long handle sponge this morning to bathe bilateral feet.  Pt attempted to use reacher to thread pants over feet but continues to require assistance.  Pt initiated pulling up pants while min A for standing provided.  Pt recalls hemi dressing techniques for donning shirt but requires assistance threading RUE and pulling over trunk.  Pt transitioned to w/c mobility activities requires min A for navigating in cluttered environment. Focus on activity tolerance, functional transfers, sit<>stand, standing balance, w/c mobility, sequencing, and safety awareness to increase independence with BADLs.  Therapy Documentation Precautions:  Precautions Precautions: Fall Restrictions Weight Bearing Restrictions: No Pain: Pain Assessment Pain Assessment: 0-10 Pain Score: 4  Pain Type: Acute pain Pain Location: Shoulder Pain Orientation: Right Pain Descriptors / Indicators: Sore;Dull Pain Onset: On-going Pain  Intervention(s): Repositioned  See Function Navigator for Current Functional Status.   Therapy/Group: Individual Therapy  Rich BraveLanier, Aj Crunkleton Chappell 10/21/2015, 8:58 AM

## 2015-10-21 NOTE — Progress Notes (Signed)
[name]     PROGRESS NOTE  Subjective/Complaints: Continues to have RUE neuropathic pain. intereferes with his sleep.  ROS: + Right sided pain. Denies CP, SOB, N/V/D   Objective: Vital Signs: Blood pressure 127/60, pulse 68, temperature 98.6 F (37 C), temperature source Oral, resp. rate 18, weight 113.5 kg (250 lb 3.6 oz), SpO2 99 %. No results found.  Recent Labs  10/21/15 0438  WBC 4.9  HGB 12.5*  HCT 35.2*  PLT 188    Recent Labs  10/21/15 0438  NA 140  K 4.5  CL 107  GLUCOSE 129*  BUN 11  CREATININE 0.96  CALCIUM 9.1   CBG (last 3)  No results for input(s): GLUCAP in the last 72 hours.  Wt Readings from Last 3 Encounters:  10/15/15 113.5 kg (250 lb 3.6 oz)  10/05/15 115.5 kg (254 lb 10.1 oz)    Physical Exam:  BP 127/60 mmHg  Pulse 68  Temp(Src) 98.6 F (37 C) (Oral)  Resp 18  Wt 113.5 kg (250 lb 3.6 oz)  SpO2 99% Constitutional: He appears well-developed and well-nourished. NAD. Vital signs reviewed HENT: Normocephalic and atraumatic.  Mouth/Throat: Oropharynx is pink, clear and moist.  Eyes: Conjunctivae and EOM are normal. Right eye exhibits no discharge. Left eye exhibits no discharge.  Cardiovascular: Normal rate and regular rhythm.No murmur heard. Respiratory: Effort normal and breath sounds normal. No respiratory distress. He has no wheezes.  GI: Soft. Bowel sounds are normal. He exhibits no distension. There is no tenderness.  Musculoskeletal: He exhibits no edema. He exhibits no tenderness.  Neurological: He is alert and oriented.  Right hemifacial weakness.  Mild dysphonia with moderate dysarthria  Has good awareness and insight into deficits.  LUE/LLE: 5/5 proximal to distal RUE: 0/5 prox to distal. RLE: 1/5 HE and KE, distally 0/5.  RUE: 0/5 prox to distal. RLE: 1/5 HE and KE, distally 0/5.  Sensation diminished to light touch and right upper and right lower  Extremities (stable). Skin: Skin  is warm and dry.  Psychiatric: He has a normal mood and affect. His behavior is normal. Judgment and thought content normal.   Assessment/Plan: 1. 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 Physiatrist is providing close team supervision and 24 hour management of active medical problems listed below. Physiatrist and rehab team continue to assess barriers to discharge/monitor patient progress toward functional and medical goals.  Function:  Bathing Bathing position   Position: Shower  Bathing parts Body parts bathed by patient: Left arm, Chest, Abdomen, Front perineal area, Right upper leg, Left upper leg Body parts bathed by helper: Right arm, Buttocks, Right lower leg, Left lower leg, Back  Bathing assist Assist Level: Touching or steadying assistance(Pt > 75%)      Upper Body Dressing/Undressing Upper body dressing   What is the patient wearing?: Pull over shirt/dress     Pull over shirt/dress - Perfomed by patient: Thread/unthread left sleeve, Put head through opening Pull over shirt/dress - Perfomed by helper: Thread/unthread right sleeve, Pull shirt over trunk        Upper body assist Assist Level: Touching or steadying assistance(Pt > 75%)      Lower Body Dressing/Undressing Lower body dressing   What is the patient wearing?: Pants, Socks, Shoes   Underwear - Performed by helper: Thread/unthread right underwear leg, Thread/unthread  left underwear leg, Pull underwear up/down Pants- Performed by patient: Thread/unthread left pants leg Pants- Performed by helper: Thread/unthread right pants leg, Thread/unthread left pants leg, Pull pants up/down   Non-skid slipper socks- Performed by helper: Don/doff right sock, Don/doff left sock   Socks - Performed by helper: Don/doff right  sock, Don/doff left sock   Shoes - Performed by helper: Don/doff right shoe, Don/doff left shoe, Fasten right, Fasten left          Lower body assist Assist for lower body dressing: 2 Designer, multimediaHelpers      Toileting Toileting Toileting activity did not occur: Safety/medical concerns   Toileting steps completed by helper: Adjust clothing prior to toileting, Performs perineal hygiene, Adjust clothing after toileting Toileting Assistive Devices: Grab bar or rail  Toileting assist Assist level: Two helpers   Transfers Chair/bed transfer   Chair/bed transfer method: Squat pivot Chair/bed transfer assist level: Maximal assist (Pt 25 - 49%/lift and lower) Chair/bed transfer assistive device: Sliding board Mechanical lift: Landscape architecttedy   Locomotion Ambulation     Max distance: 20 Assist level: 2 helpers   Wheelchair   Type: Manual Max wheelchair distance: 135 Assist Level: Touching or steadying assistance (Pt > 75%)  Cognition Comprehension Comprehension assist level: Understands complex 90% of the time/cues 10% of the time  Expression Expression assist level: Expresses basic 75 - 89% of the time/requires cueing 10 - 24% of the time. Needs helper to occlude trach/needs to repeat words.  Social Interaction Social Interaction assist level: Interacts appropriately 90% of the time - Needs monitoring or encouragement for participation or interaction.  Problem Solving Problem solving assist level: Solves basic 90% of the time/requires cueing < 10% of the time  Memory Memory assist level: Recognizes or recalls 75 - 89% of the time/requires cueing 10 - 24% of the time   Medical Problem List and Plan: 1. 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 DVT Prophylaxis/Anticoagulation: Mechanical: Sequential compression devices, below knee Bilateral lower extremities 3. Pain Management: Tylenol prn.   4. Mood: LCSW to follow for evaluation and support.  5. Neuropsych: This patient is capable of making decisions on hid own behalf. 6. Skin/Wound Care: Routine pressure relief measures.  7. Fluids/Electrolytes/Nutrition: Monitor I/O.   -Electrolytes WNL today---all labs personally reviewed  -Labs ordered 8. HTN: .  -Norvasc increased to 10MG  on 12/24  -Coreg increased to 25 on 1/2 with good results 9. Dyslipidemia: Cotinue Lipitor daily.  10. Neuropathic pain: persistent RUE pain despite large dose of gabapentin  -Gabapentin increased to 1200 3 times a day on 1/1  -add low dose pamelor at HS to help with sleep + pain   11. Constipation  -Improved  LOS (Days) 13 A FACE TO FACE EVALUATION WAS PERFORMED  Sean Wilkerson 10/21/2015 8:40 AM

## 2015-10-21 NOTE — Progress Notes (Signed)
Occupational Therapy Session Note  Patient Details  Name: Adolphus BirchwoodLarry Lee Landsberg MRN: [id number] Date of Birth: 07-04-1946  Today's Date: 10/21/2015 OT Individual Time: 1300-1330 OT Individual Time Calculation (min): 30 min    Skilled Therapeutic Interventions/Progress Updates:    1:1 NMES applied to supraspinatus and middle deltoid to help approximate shoulder joint to reduce sublux and reduce pain.   Ratio 1:1 Rate 35 pps Waveform- Asymmetric Ramp 1.0 Pulse 300 Intensity- 24-26 Duration - 15 mins  No adverse reactions after treatment and is skin intact.   Also applied kinesotape to right distal hand for swelling and to draw attention to UE for proper positioning.   Therapy Documentation Precautions:  Precautions Precautions: Fall Restrictions Weight Bearing Restrictions: No Pain:  4/10 in shoulder but decr to none with e stim  See Function Navigator for Current Functional Status.   Therapy/Group: Individual Therapy  [name], Levada [facility] 10/21/2015, 3:25 PM

## 2015-10-22 ENCOUNTER — Inpatient Hospital Stay (HOSPITAL_COMMUNITY): Payer: Medicare Other | Admitting: Physical Therapy

## 2015-10-22 ENCOUNTER — Inpatient Hospital Stay (HOSPITAL_COMMUNITY): Payer: Medicare Other | Admitting: Occupational Therapy

## 2015-10-22 ENCOUNTER — Inpatient Hospital Stay (HOSPITAL_COMMUNITY): Payer: Medicare Other | Admitting: Speech Pathology

## 2015-10-22 NOTE — Progress Notes (Signed)
Physical Therapy Session Note  Patient Details  Name: Sean Wilkerson Adolphus BirchwoodMRN: [id number] Date of Birth: [birth year]  Today's Date: 10/22/2015 PT Individual Time: 1355-1510 PT Individual Time Calculation (min): 75 min   Short Term Goals: Week 2:  PT Short Term Goal 1 (Week 2): Patient will transfer bed <> wheelchair with min assist of one person. PT Short Term Goal 2 (Week 2): Patient will perform sit <> stand with consistent min A.  PT Short Term Goal 3 (Week 2): Patient will ambulate 35 ft with max assist of one person. PT Short Term Goal 4 (Week 2): Patient will perform stair negotiation of 4 steps with max assist. PT Short Term Goal 5 (Week 2): Patient will propel wheelchair via L hemi technique x 100 ft with supervision.  Skilled Therapeutic Interventions/Progress Updates:   Focus on functional transfers, standing tolerance, midline positioning, postural control, RLE NMR, and activity tolerance.    Patient propelled wheelchair via L hemi technique 2 x 135 ft with supervision except 1 episode of min A to advance over slight incline with increased time and verbal cues for obstacle negotiation/steering.   Patient performed 3 static standing trials with mirror for visual feedback of 1.5 minutes, 35 seconds, and 2.5 minutes without assistive device with min-mod A with focus on promoting upright posture, positioning in midline, and even weight distribution. On third trial, patient c/o feeing "funny," see vitals below and required prolonged seated rest break.   Block practice squat pivot transfer training wheelchair <> mat table to R and L x 6 total via [place]ath technique with mod verbal/tactile cues for head hips relationship and L hand placement with min A going to L and mod A going to R. Patient required min cues for directing wheelchair setup prior to transfers and mod cues for managing L arm rest, total A for managing R leg rest, arm rest, and brake.   Patient declined further standing  activities due to "feeing funny"/lightheadedness and fatigue. Performed simulated car transfer to sedan height via squat pivot transfer with max A and cues for sequencing, technique, and safety and total A to bring RLE in/out of car. Discussed possible need to practice actual car transfer, patient verbalized understanding and will discuss further with wife tomorrow at family training. Patient requested to return to his room due to fatigue and left sitting in wheelchair with needs within reach and visitor and NT present.   Therapy Documentation Precautions:  Precautions Precautions: Fall Restrictions Weight Bearing Restrictions: No Vital Signs: Therapy Vitals Pulse Rate: 73 BP: (!) 147/77 mmHg Patient Position (if appropriate): Sitting Oxygen Therapy SpO2: 95 % O2 Device: Not Delivered Pain: Pain Assessment Pain Assessment: 0-10 Pain Score: 3  Pain Type: Acute pain Pain Location: Leg Pain Orientation: Right Pain Descriptors / Indicators: Sharp Pain Onset: On-going Pain Intervention(s): Repositioned;Rest   See Function Navigator for Current Functional Status.   Therapy/Group: Individual Therapy  [name], Arlicia Paquette A 10/22/2015, 3:13 PM

## 2015-10-22 NOTE — Progress Notes (Signed)
Speech Language Pathology Daily Session Note  Patient Details  Name: Sean Wilkerson MRN: [id number] Date of Birth: 15-Jun-1946  Today's Date: 10/22/2015 SLP Individual Time: 0800-0900 SLP Individual Time Calculation (min): 60 min  Short Term Goals: Week 2: SLP Short Term Goal 1 (Week 2): Patient will consume current diet with minimal overt s/s of aspiration with supervision verbal cues for use of small bites/sips via cup.  SLP Short Term Goal 2 (Week 2): Patient will utilize speech intelligibility strategies at the phrase level with supervision verbal cues to self-monitor and correct errors.  SLP Short Term Goal 3 (Week 2): Patient will utilize diaphragmatic breathing at the word level with Min A verbal cues for accuracy and to self-monitor and correct errors.   Skilled Therapeutic Interventions: Skilled treatment session focused on dysphagia and speech goals. Upon arrival, patient was awake while upright in bed consuming his breakfast meal of Dys. 3 textures with thin liquids. SLP facilitated session by providing supervision verbal cues for use of small sips with thin liquids. Patient consumed meal without overt s/s of aspiration. SLP also facilitated session by providing Min A verbal and question cues for patient to self-monitor and correct errors in regards to speech intelligibility and use of compensatory strategies at the phrase and sentence level. Patient was ~90% intelligible throughout task. Patient left upright in bed with alarm on and all needs within reach. Continue with current plan of care.    Function:  Eating Eating   Modified Consistency Diet: Yes Eating Assist Level: Set up assist for;Supervision or verbal cues   Eating Set Up Assist For: Opening containers       Cognition Comprehension Comprehension assist level: Understands complex 90% of the time/cues 10% of the time  Expression   Expression assist level: Expresses basic 75 - 89% of the time/requires cueing 10 -  24% of the time. Needs helper to occlude trach/needs to repeat words.  Social Interaction Social Interaction assist level: Interacts appropriately 90% of the time - Needs monitoring or encouragement for participation or interaction.  Problem Solving Problem solving assist level: Solves basic 90% of the time/requires cueing < 10% of the time  Memory Memory assist level: Recognizes or recalls 75 - 89% of the time/requires cueing 10 - 24% of the time    Pain Pain Assessment Pain Assessment: No/denies pain  Therapy/Group: Individual Therapy  Lauralei Clouse 10/22/2015, 11:48 AM

## 2015-10-22 NOTE — Progress Notes (Signed)
[facility] PHYSICAL MEDICINE & REHABILITATION     PROGRESS NOTE  Subjective/Complaints:  RUE pain better last night. Able to sleep much more.   ROS: + Right sided pain. Denies CP, SOB, N/V/D   Objective: Vital Signs: Blood pressure 130/61, pulse 69, temperature 98.4 F (36.9 C), temperature source Oral, resp. rate 18, weight 113.5 kg (250 lb 3.6 oz), SpO2 99 %. No results found.  Recent Labs  10/21/15 0438  WBC 4.9  HGB 12.5*  HCT 35.2*  PLT 188    Recent Labs  10/21/15 0438  NA 140  K 4.5  CL 107  GLUCOSE 129*  BUN 11  CREATININE 0.96  CALCIUM 9.1   CBG (last 3)  No results for input(s): GLUCAP in the last 72 hours.  Wt Readings from Last 3 Encounters:  10/15/15 113.5 kg (250 lb 3.6 oz)  10/05/15 115.5 kg (254 lb 10.1 oz)    Physical Exam:  BP 130/61 mmHg  Pulse 69  Temp(Src) 98.4 F (36.9 C) (Oral)  Resp 18  Wt 113.5 kg (250 lb 3.6 oz)  SpO2 99% Constitutional: He appears well-developed and well-nourished. NAD. Vital signs reviewed HENT: Normocephalic and atraumatic.  Mouth/Throat: Oropharynx is pink, clear and moist.  Eyes: Conjunctivae and EOM are normal. Right eye exhibits no discharge. Left eye exhibits no discharge.  Cardiovascular: Normal rate and regular rhythm.No murmur heard. Respiratory: Effort normal and breath sounds normal. No respiratory distress. He has no wheezes.  GI: Soft. Bowel sounds are normal. He exhibits no distension. There is no tenderness.  Musculoskeletal: He exhibits no edema. He exhibits no tenderness.  Neurological: He is alert and oriented.  Right hemifacial weakness.  Mild dysphonia and persistent moderate dysarthria  Has good awareness and insight into deficits.  LUE/LLE: 5/5 proximal to distal RUE: 0/5 prox to distal. RLE: 1/5 HE and KE, distally 0/5.  RUE: 0/5 prox to distal. RLE: 1/5 HE and KE, distally 0/5.  Sensation diminished to light touch and right upper and right lower  Extremities (stable). Skin: Skin  is warm and dry.  Psychiatric: He has a normal mood and affect. His behavior is normal. Judgment and thought content normal.   Assessment/Plan: 1. 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 Physiatrist is providing close team supervision and 24 hour management of active medical problems listed below. Physiatrist and rehab team continue to assess barriers to discharge/monitor patient progress toward functional and medical goals.  Function:  Bathing Bathing position   Position: Shower  Bathing parts Body parts bathed by patient: Left arm, Chest, Abdomen, Front perineal area, Left upper leg, Right lower leg, Left lower leg, Right upper leg Body parts bathed by helper: Buttocks, Back  Bathing assist Assist Level: Touching or steadying assistance(Pt > 75%)      Upper Body Dressing/Undressing Upper body dressing   What is the patient wearing?: Pull over shirt/dress     Pull over shirt/dress - Perfomed by patient: Thread/unthread left sleeve, Put head through opening Pull over shirt/dress - Perfomed by helper: Thread/unthread right sleeve, Pull shirt over trunk        Upper body assist Assist Level: Touching or steadying assistance(Pt > 75%)      Lower Body Dressing/Undressing Lower body dressing   What is the patient wearing?: Pants, Socks, Shoes   Underwear - Performed by helper: Thread/unthread right underwear leg,  Thread/unthread left underwear leg, Pull underwear up/down Pants- Performed by patient: Thread/unthread left pants leg, Thread/unthread right pants leg Pants- Performed by helper: Pull pants up/down   Non-skid slipper socks- Performed by helper: Don/doff right sock, Don/doff left sock   Socks - Performed by helper: Don/doff right sock, Don/doff left sock   Shoes -  Performed by helper: Don/doff right shoe, Don/doff left shoe, Fasten right, Fasten left          Lower body assist Assist for lower body dressing: 2 Designer, multimedia activity did not occur: Safety/medical concerns   Toileting steps completed by helper: Adjust clothing prior to toileting, Performs perineal hygiene, Adjust clothing after toileting Toileting Assistive Devices: Grab bar or rail  Toileting assist Assist level: Two helpers   Transfers Chair/bed transfer   Chair/bed transfer method: Squat pivot Chair/bed transfer assist level: Maximal assist (Pt 25 - 49%/lift and lower) Chair/bed transfer assistive device: Sliding board Mechanical lift: Landscape architect     Max distance: 20 Assist level: 2 helpers   Wheelchair   Type: Manual Max wheelchair distance: 135 Assist Level: Touching or steadying assistance (Pt > 75%)  Cognition Comprehension Comprehension assist level: Understands complex 90% of the time/cues 10% of the time  Expression Expression assist level: Expresses basic 75 - 89% of the time/requires cueing 10 - 24% of the time. Needs helper to occlude trach/needs to repeat words.  Social Interaction Social Interaction assist level: Interacts appropriately 90% of the time - Needs monitoring or encouragement for participation or interaction.  Problem Solving Problem solving assist level: Solves basic 90% of the time/requires cueing < 10% of the time  Memory Memory assist level: Recognizes or recalls 75 - 89% of the time/requires cueing 10 - 24% of the time   Medical Problem List and Plan: 1. 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 DVT Prophylaxis/Anticoagulation: Mechanical: Sequential compression devices, below knee Bilateral lower extremities 3. Pain Management: Tylenol prn.  4. Mood: LCSW to follow for evaluation  and support.  5. Neuropsych: This patient is capable of making decisions on hid own behalf. 6. Skin/Wound Care: Routine pressure relief measures.  7. Fluids/Electrolytes/Nutrition: Monitor I/O.   -encourage PO 8. HTN: .  -Norvasc increased to 10MG  on 12/24  -Coreg increased to 25 on 1/2   9. Dyslipidemia: Cotinue Lipitor daily.  10. Neuropathic pain: persistent RUE pain despite large dose of gabapentin  -Gabapentin increased to 1200 3 times a day on 1/1  -added low dose pamelor at HS to help with sleep + pain with good results   11. Constipation  -Improved  LOS (Days) 14 A FACE TO FACE EVALUATION WAS PERFORMED  Tashea Othman T 10/22/2015 8:42 AM

## 2015-10-22 NOTE — Progress Notes (Signed)
Social Work Patient ID: Sean BirchwoodLarry Lee Wilkerson, male   DOB: [birth year], 70 y.o.   MRN: [id number]  Have reviewed team conference with pt and wife.  Both aware that d/c date remains targeted for 1/12.  Have scheduled for wife to be here tomorrow at 9am to begin family education.  Discussed need for ramp and have left rental/ portable ramp info in pt's room.  I will follow up with wife on Friday.  Henessy Rohrer, LCSW

## 2015-10-22 NOTE — Plan of Care (Signed)
Problem: RH Ambulation Goal: LTG Patient will ambulate in controlled environment (PT) LTG: Patient will ambulate in a controlled environment, # of feet with assistance (PT).  Outcome: Not Applicable Date Met:  85/02/77 Goal DC'd due to slow pt progress. Goal: LTG Patient will ambulate in home environment (PT) LTG: Patient will ambulate in home environment, # of feet with assistance (PT).  Outcome: Not Applicable Date Met:  41/28/78 Goal DC'd due to slow pt progress.

## 2015-10-22 NOTE — Consult Note (Signed)
NEUROBEHAVIORAL STATUS EXAM - [facility]   MEDICAL NECESSITY:  [name] was [facility] for a neurobehavioral status exam owing to the patient's diagnosis of cerebral infarction, and to assist in treatment planning during admission.   Records indicate that [name] is a "70 y.o. left handed male with history of HTN, recent syncopal episode (being evaluated with cardiac monitor) who was admitted on 10/05/15 with right sided weakness and slurred speech. CT head negative and he was treated with tPA for NIHSS 7. MRI/MRA brain done showing acute 3 cm oval lacunar infarct extending from the left corona radiata through the left external capsule and posterior left lentiform nuclei and mild to moderate right PCA P2 segment stenosis with preserved distal flow.  2D echo with EF 55-60% with no wall abnormality. Carotid dopplers without ICA stenosis. Dr. Pearlean BrownieSethi felt that patient had left brain subcortical stroke due to small vessel disease and to continue ASA for secondary stroke prevention.Patient with resultant right hemiparesis, moderate to severe dysarthria and dysphagia. Therapy evaluations done yesterday and CIR recommended for follow up therapy."  During today's visit, [name] denied suffering from any cognitive deficits post-stroke. He endorsed slurred speech and right-side motor issues. From an emotional standpoint, he feels embarrassed that he suffered a stroke but is trying to stay positive. He particularly is distressed given that he was thriving in his daily life right before this event occurred. He is also still in the stage where he is trying to process the fact that he actually had a stroke. No major adjustment issues endorsed. Suicidal/homicidal ideation, plan or intent was denied. No manic or hypomanic episodes were reported. The patient denied ever experiencing any auditory/visual hallucinations. No major  behavioral or personality changes were endorsed.   Patient described poor sleep owing to how uncomfortable he feels in his hospital bed. Medicine has been helpful with sleep but he is still waking up at 2-3AM and has trouble going back to sleep.  [name] described therapy as "very good but tough." He believes that he is making progress. No barriers to therapy identified. He appears to have ample social support via his wife and family.   PROCEDURES: [2 units 96116] Diagnostic clinical interview  Review of available records  MENTAL STATUS EXAM: APPEARANCE:  Mild facial droop GEN:  Alert and oriented MOOD:  Mildly depressed       AFFECT:  Blunted  SPEECH:  Mildly dysarthric      THOUGHT CONTENT:  Appropriate HALLUCINATIONS:  None INTELLIGENCE:  Average  INSIGHT:  Good JUDGMENT:  Good SUICIDAL IDEATION:  Denies SI   HOMICIDAL IDEATION:   Denies HI   IMPRESSION: Overall, [name] denied suffering from any cognitive issues post-stroke and no blatant cognitive impairments were observed. Emotionally, he is still coming to grips with the fact that he suffered a stroke which leads to mild distress and embarrassment. However, overall, he has been coping well with the situation and this hospitalization given the support of his family and his faith. Time was spent processing how he feels about the stroke and how he perceives his future. Multiple reframes were offered. I also provided information on stroke recovery. For sleep, I wonder if there are different options for the types of hospital beds provided. I ask that his social worker explore this possibility. I plan to follow-up with [name] next Monday for supportive psychotherapy and to assist with coping.     Debbe MountsAdam T. Sayeed Weatherall,  Psy.D.  Clinical Neuropsychologist

## 2015-10-22 NOTE — Progress Notes (Signed)
Occupational Therapy Session Note  Patient Details  Name: Sean Wilkerson MRN: [id number] Date of Birth: 1946-03-03  Today's Date: 10/22/2015 OT Individual Time: 1100-1158 OT Individual Time Calculation (min): 58 min    Short Term Goals: Week 2:  OT Short Term Goal 1 (Week 2): Pt will complete toilet transfer with max A + 1 OT Short Term Goal 2 (Week 2): Pt will complete toileting tasks with max A OT Short Term Goal 3 (Week 2): Pt will utilize LUE as stabilizer during functional tasks with max A OT Short Term Goal 4 (Week 2): Pt will complete LB dressing tasks with max A OT Short Term Goal 5 (Week 2): Pt will complete UB dressing tasks with min A  Skilled Therapeutic Interventions/Progress Updates:    Pt completed bathing and dressing during session.  Max assist for transfers stand pivot to the walk-in shower. Max hand over hand assistance to integrate the RUE into the task.  Mod assist for pt to stand in order for therapist to complete washing his peri area.  He utilized LH sponge for washing his lower legs and feet.  Max instructional cueing for hemi dressing techniques for LB with min assist for donning pullover shirt.  Pt with increased difficulty crossing the RLE over the left knee or ankle and maintain for dressing.  Pt left in wheelchair at end of session with call button in place.    Therapy Documentation Precautions:  Precautions Precautions: Fall Restrictions Weight Bearing Restrictions: No  Pain: Pain Assessment Pain Assessment: No/denies pain ADL: See Function Navigator for Current Functional Status.   Therapy/Group: Individual Therapy  Sean Wilkerson OTR/L 10/22/2015, 12:56 PM

## 2015-10-22 NOTE — Patient Care Conference (Signed)
Inpatient RehabilitationTeam Conference and Plan of Care Update Date: 10/21/2015   Time: 2:15 PM    Patient Name: Sean Wilkerson      Medical Record Number: [id number]  Date of Birth: May 01, 1946 Sex: Male         Room/Bed: 4W10C/4W10C-01 Payor Info: Payor: Multimedia programmer / Plan: [employer] MEDICARE / Product Type: *No Product type* /    Admitting Diagnosis: L cva  Admit Date/Time:  10/08/2015  5:27 PM Admission Comments: No comment available   Primary Diagnosis:  Stroke, acute, thrombotic (HCC) Principal Problem: Stroke, acute, thrombotic [facility])  Patient Active Problem List   Diagnosis Date Noted  . Slow transit constipation   . Neuropathic pain   . Stroke, acute, thrombotic (HCC) 10/08/2015  . 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 Essential hypertension 10/08/2015  . HLD (hyperlipidemia) 10/08/2015  . Hemiplegia and hemiparesis following cerebral infarction affecting right dominant side (HCC)   . Right hemiplegia (HCC) 10/06/2015  . Stroke (cerebrum) (HCC) 10/05/2015  . CVA (cerebral infarction) 10/05/2015    Expected Discharge Date: Expected Discharge Date: 10/30/15  Team Members Present: Physician leading conference: Dr. Faith Rogue Social Worker Present: Amada Jupiter, LCSW Nurse Present: Carmie End, RN PT Present: Katherine Mantle, PT OT Present: Ardis Rowan, COTA;Jennifer Katrinka Blazing, OT SLP Present: Feliberto Gottron, SLP PPS Coordinator present : Tora Duck, RN, CRRN     Current Status/Progress Goal Weekly Team Focus  Medical   right hemiparesis, neuro pain, bp control better. po intake improved  improved pain control and activity tolerance  stroke mgt, pain control   Bowel/Bladder   Continent of bowel and bladder. LBM 10/20/15  Pt to remain continent of bowel and bladder  Monitor   Swallow/Nutrition/ Hydration   Dys. 3 textures with thin liquids, Min A for use of swallowing strategies   Mod I with  least restrictive diet  tolerance of current diet, increased use of swallowing strategies, trials of regular textures   ADL's   mn A overall; bathing-mod A; UB dressing-min A; LB dressing-max A; functional transfers-mod A  supervision/min A overall  funcitonal transfers, BADL retraining, sit<>stand, standing balance, toileting   Mobility   mod A transfer, w/c supervision  min A transfers, supervision w/c  balance, endurance, R UE/LE strengthening   Communication   Min-Mod A  Mod I at sentence level  RMT, increased use of speech intelligibility strategies, emergent awareness of errors    Safety/Cognition/ Behavioral Observations            Pain   No c/o pain  <3  Monitor for nonverbal cues of pain   Skin   CDI  CDI  CDI    Rehab Goals Patient on target to meet rehab goals: Yes *See Care Plan and progress notes for long and short-term goals.  Barriers to Discharge: neuro pain. ongoing dense right hemiparesis    Possible Resolutions to Barriers:  see prior, NMR    Discharge Planning/Teaching Needs:  Home with family able to provide 24/7 assistance  Teaching is ongoing.   Team Discussion:  Still with arm/ shoulder pain;  Taping and gabapentin.  Requiring a lot of cues with transfers and most activities.  No change in UE fxn.  Slow to understand strategies.  ST and team still considering extent of cognitive deficits.  Poor endurance overall.  Tx report must have ramp at home and hope to begin family ed by end of the week.  Revisions to  Treatment Plan:  None   Continued Need for Acute Rehabilitation Level of Care: The patient requires daily medical management by a physician with specialized training in physical medicine and rehabilitation for the following conditions: Daily direction of a multidisciplinary physical rehabilitation program to ensure safe treatment while eliciting the highest outcome that is of practical value to the patient.: Yes Daily medical management of patient  stability for increased activity during participation in an intensive rehabilitation regime.: Yes Daily analysis of laboratory values and/or radiology reports with any subsequent need for medication adjustment of medical intervention for : Other;Neurological problems  Merwyn Hodapp 10/24/2015, 10:15 AM

## 2015-10-23 ENCOUNTER — Inpatient Hospital Stay (HOSPITAL_COMMUNITY): Payer: Medicare Other | Admitting: Occupational Therapy

## 2015-10-23 ENCOUNTER — Inpatient Hospital Stay (HOSPITAL_COMMUNITY): Payer: Medicare Other | Admitting: Physical Therapy

## 2015-10-23 ENCOUNTER — Inpatient Hospital Stay (HOSPITAL_COMMUNITY): Payer: Medicare Other | Admitting: Speech Pathology

## 2015-10-23 ENCOUNTER — Ambulatory Visit (HOSPITAL_COMMUNITY): Payer: Medicare Other

## 2015-10-23 DIAGNOSIS — R609 Edema, unspecified: Secondary | ICD-10-CM

## 2015-10-23 LAB — GLUCOSE, CAPILLARY: Glucose-Capillary: 106 mg/dL — ABNORMAL HIGH (ref 65–99)

## 2015-10-23 MED ORDER — GABAPENTIN 300 MG PO CAPS
900.0000 mg | ORAL_CAPSULE | Freq: Three times a day (TID) | ORAL | Status: DC
  Administered 2015-10-23 – 2015-10-28 (×18): 900 mg via ORAL
  Filled 2015-10-23 (×5): qty 3
  Filled 2015-10-23: qty 9
  Filled 2015-10-23 (×12): qty 3

## 2015-10-23 MED ORDER — GABAPENTIN 600 MG PO TABS: 900.0000 mg | ORAL_TABLET | Freq: Three times a day (TID) | ORAL | Status: DC

## 2015-10-23 NOTE — Progress Notes (Signed)
Speech Language Pathology Daily Session Note  Patient Details  Name: Sean Wilkerson MRN: [id number] Date of Birth: 08-Oct-1946  Today's Date: 10/23/2015 SLP Individual Time: 1000-1055 SLP Individual Time Calculation (min): 55 min  Short Term Goals: Week 2: SLP Short Term Goal 1 (Week 2): Patient will consume current diet with minimal overt s/s of aspiration with supervision verbal cues for use of small bites/sips via cup.  SLP Short Term Goal 2 (Week 2): Patient will utilize speech intelligibility strategies at the phrase level with supervision verbal cues to self-monitor and correct errors.  SLP Short Term Goal 3 (Week 2): Patient will utilize diaphragmatic breathing at the word level with Min A verbal cues for accuracy and to self-monitor and correct errors.   Skilled Therapeutic Interventions: Skilled treatment session focused on speech goals and family education with the patient's wife. SLP facilitated session by providing verbal education along with demonstration in regards to patient's current speech intelligibility, strategies to utilize to maximize intelligibility and use of RMT devices. [name] verbalized understanding of all information and demonstrated appropriate cueing throughout session. Patient required Min A verbal cues for use of speech intelligibility strategies at the phrase and sentence level during a verbal description task to achieve 90% intelligibility. SLP also facilitated session by providing Min A verbal cues for accuracy with use of IMST device. Patient performed IMST exercises at 9 cm H2O for 5 sets of 5 reps with a self-perceived effort level of 5/10. Patient left upright in wheelchair with wife present. Continue with current plan of care.    Function:  Cognition Comprehension Comprehension assist level: Understands complex 90% of the time/cues 10% of the time  Expression   Expression assist level: Expresses basic 75 - 89% of the time/requires cueing 10 -  24% of the time. Needs helper to occlude trach/needs to repeat words.  Social Interaction Social Interaction assist level: Interacts appropriately 90% of the time - Needs monitoring or encouragement for participation or interaction.  Problem Solving Problem solving assist level: Solves basic 90% of the time/requires cueing < 10% of the time  Memory Memory assist level: Recognizes or recalls 75 - 89% of the time/requires cueing 10 - 24% of the time    Pain No/Denies Pain   Therapy/Group: Individual Therapy  Shelli Portilla 10/23/2015, 5:03 PM

## 2015-10-23 NOTE — Progress Notes (Signed)
Physical Therapy Session Note  Patient Details  Name: Sean Wilkerson MRN: [id number] Date of Birth: [birth year]  Today's Date: 10/23/2015 PT Individual Time: 1105-1200 PT Individual Time Calculation (min): 55 min   Short Term Goals: Week 2:  PT Short Term Goal 1 (Week 2): Patient will transfer bed <> wheelchair with min assist of one person. PT Short Term Goal 2 (Week 2): Patient will perform sit <> stand with consistent min A.  PT Short Term Goal 3 (Week 2): Patient will ambulate 35 ft with max assist of one person. PT Short Term Goal 4 (Week 2): Patient will perform stair negotiation of 4 steps with max assist. PT Short Term Goal 5 (Week 2): Patient will propel wheelchair via L hemi technique x 100 ft with supervision.  Skilled Therapeutic Interventions/Progress Updates:   Session focused on family education/training with patient and wife in preparation for discharge home next week. Patient up in wheelchair with wife present. Discussed need to obtain measurements for doorways, DME needs (hospital bed, wheelchair with cushion, 1/2 lap tray, etc), discussed portable ramp information, and need to practice actual car transfer to patient's wife's car next week. Patient and wife verbalized understanding. [name] expressed interest in purchasing [employer], provided information will discuss further over next week after she has initiated more hands-on training. Patient propelled wheelchair via L hemi technique x 150 ft with supervision. Education focused on squat pivot transfer training with emphasis on wheelchair set up, scooting forward in chair, hand placement, RLE placement, and head hips relationship. Emphasized that therapist is directing patient's hips, not "lifting" him and demonstrated Bobath technique to promote forward weight shift. Patient performed transfer to L to mat table and back to wheelchair to R with min A overall. Practiced bed mobility on mat table to L with focus on crossing LLE  under RLE to bring BLE onto bed at same time and protecting RUE during sit <> supine. After demonstration, patient's wife assisted patient with supine > sit with mod cues for sequencing and technique, min A overall to manage RLE. Performed standing trials for activity tolerance/balance with focus on midline position and equal weightbearing x 30 sec + 1 minute with min A overall. Patient fatigued from morning of family training and required multiple seated rest breaks. Patient left sitting in wheelchair with all needs within reach and wife in room. Continue family training with patient and wife.     Therapy Documentation Precautions:  Precautions Precautions: Fall Restrictions Weight Bearing Restrictions: No Pain: Pain Assessment Pain Assessment: 0-10 Pain Score: 3  Pain Type: Acute pain Pain Location: Arm Pain Orientation: Right Pain Descriptors / Indicators: Aching Pain Onset: Gradual Pain Intervention(s): Repositioned (sling)   See Function Navigator for Current Functional Status.   Therapy/Group: Individual Therapy  [name], Shareen Capwell A 10/23/2015, 12:04 PM

## 2015-10-23 NOTE — Progress Notes (Signed)
PHYSICAL MEDICINE & REHABILITATION     PROGRESS NOTE  Subjective/Complaints:  Complaining of Right leg pain last night. Right arm better?   ROS: + Right sided pain. Denies CP, SOB, N/V/D   Objective: Vital Signs: Blood pressure 127/71, pulse 66, temperature 98.7 F (37.1 C), temperature source Oral, resp. rate 19, weight 111.721 kg (246 lb 4.8 oz), SpO2 99 %. No results found.  Recent Labs  10/21/15 0438  WBC 4.9  HGB 12.5*  HCT 35.2*  PLT 188    Recent Labs  10/21/15 0438  NA 140  K 4.5  CL 107  GLUCOSE 129*  BUN 11  CREATININE 0.96  CALCIUM 9.1   CBG (last 3)  No results for input(s): GLUCAP in the last 72 hours.  Wt Readings from Last 3 Encounters:  10/22/15 111.721 kg (246 lb 4.8 oz)  10/05/15 115.5 kg (254 lb 10.1 oz)    Physical Exam:  BP 127/71 mmHg  Pulse 66  Temp(Src) 98.7 F (37.1 C) (Oral)  Resp 19  Wt 111.721 kg (246 lb 4.8 oz)  SpO2 99% Constitutional: He appears well-developed and well-nourished. NAD. Vital signs reviewed HENT: Normocephalic and atraumatic.  Mouth/Throat: Oropharynx is pink, clear and moist.  Eyes: Conjunctivae and EOM are normal. Right eye exhibits no discharge. Left eye exhibits no discharge.  Cardiovascular: Normal rate and regular rhythm.No murmur heard. Respiratory: Effort normal and breath sounds normal. No respiratory distress. He has no wheezes.  GI: Soft. Bowel sounds are normal. He exhibits no distension. There is no tenderness.  Musculoskeletal: He exhibits no edema. He exhibits no tenderness. 1+ edema RLE Neurological: He is alert and oriented.  Right hemifacial weakness.  Mild dysphonia and persistent moderate dysarthria  Has good awareness and insight into deficits.  LUE/LLE: 5/5 proximal to distal RUE: 0/5 prox to distal. RLE: 1/5 HE and KE, distally 0/5.  RUE: 0/5 prox to distal. RLE: 1/5 HE and KE, distally 0/5.  Sensation diminished to light touch and right upper and right lower   Extremities (stable). Skin: Skin is warm and dry.  Psychiatric: He has a normal mood and affect. His behavior is normal. Judgment and thought content normal.   Assessment/Plan: 1. 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 Physiatrist is providing close team supervision and 24 hour management of active medical problems listed below. Physiatrist and rehab team continue to assess barriers to discharge/monitor patient progress toward functional and medical goals.  Function:  Bathing Bathing position   Position: Shower  Bathing parts Body parts bathed by patient: Chest, Abdomen, Front perineal area, Left upper leg, Right lower leg, Left lower leg, Right upper leg, Right arm Body parts bathed by helper: Back, Buttocks, Left arm  Bathing assist Assist Level: Touching or steadying assistance(Pt > 75%)      Upper Body Dressing/Undressing Upper body dressing   What is the patient wearing?: Pull over shirt/dress     Pull over shirt/dress - Perfomed by patient: Thread/unthread left sleeve, Put head through opening, Pull shirt over trunk Pull over shirt/dress - Perfomed by helper: Thread/unthread right sleeve        Upper body assist Assist Level: Touching or steadying assistance(Pt > 75%)      Lower Body Dressing/Undressing Lower body dressing   What is the patient wearing?: Pants, Socks, Shoes Underwear - Performed by patient: Thread/unthread left  underwear leg Underwear - Performed by helper: Pull underwear up/down, Thread/unthread right underwear leg Pants- Performed by patient: Thread/unthread left pants leg Pants- Performed by helper: Pull pants up/down, Thread/unthread right pants leg   Non-skid slipper socks- Performed by helper: Don/doff right sock, Don/doff left sock   Socks -  Performed by helper: Don/doff right sock, Don/doff left sock Shoes - Performed by patient: Don/doff left shoe Shoes - Performed by helper: Don/doff right shoe, Fasten right, Fasten left          Lower body assist Assist for lower body dressing: 2 Designer, multimediaHelpers      Toileting Toileting Toileting activity did not occur: Safety/medical concerns   Toileting steps completed by helper: Adjust clothing prior to toileting, Performs perineal hygiene, Adjust clothing after toileting Toileting Assistive Devices: Grab bar or rail  Toileting assist Assist level: Two helpers   Transfers Chair/bed transfer   Chair/bed transfer method: Squat pivot Chair/bed transfer assist level: Moderate assist (Pt 50 - 74%/lift or lower) Chair/bed transfer assistive device: Armrests Mechanical lift: Landscape architecttedy   Locomotion Ambulation     Max distance: 20 Assist level: 2 helpers   Wheelchair   Type: Manual Max wheelchair distance: 135 Assist Level: Touching or steadying assistance (Pt > 75%)  Cognition Comprehension Comprehension assist level: Understands complex 90% of the time/cues 10% of the time  Expression Expression assist level: Expresses basic 75 - 89% of the time/requires cueing 10 - 24% of the time. Needs helper to occlude trach/needs to repeat words.  Social Interaction Social Interaction assist level: Interacts appropriately 90% of the time - Needs monitoring or encouragement for participation or interaction.  Problem Solving Problem solving assist level: Solves basic 90% of the time/requires cueing < 10% of the time  Memory Memory assist level: Recognizes or recalls 75 - 89% of the time/requires cueing 10 - 24% of the time   Medical Problem List and Plan: 1. 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 DVT Prophylaxis/Anticoagulation: Mechanical: Sequential compression devices, below knee  Bilateral lower extremities  -check dopplers lower ext given pain, RLE edema 3. Pain Management: Tylenol prn.  4. Mood: LCSW to follow for evaluation and support.  5. Neuropsych: This patient is capable of making decisions on hid own behalf. 6. Skin/Wound Care: Routine pressure relief measures.  7. Fluids/Electrolytes/Nutrition: Monitor I/O.   -encourage PO 8. HTN: .  -Norvasc increased to 10MG  on 12/24  -Coreg increased to 25 on 1/2   9. Dyslipidemia: Cotinue Lipitor daily.  10. Neuropathic pain: persistent RUE pain despite large dose of gabapentin  -Gabapentin--reduce to 900 TID due to fatigue.   -continue low dose pamelor at HS to help with sleep + pain with good results 11. Constipation  -Improved  LOS (Days) 15 A FACE TO FACE EVALUATION WAS PERFORMED  Almon Whitford T 10/23/2015 8:33 AM

## 2015-10-23 NOTE — Progress Notes (Signed)
VASCULAR LAB PRELIMINARY  PRELIMINARY  PRELIMINARY  PRELIMINARY  Bilateral lower extremity venous duplex completed.    Preliminary report:  Bilateral:  No evidence of DVT, superficial thrombosis, or Baker's Cyst.   Elior Robinette, RVS 10/23/2015, 3:56 PM

## 2015-10-23 NOTE — Progress Notes (Signed)
10/23/15 1036 nursing Vascular lab called and they said they can take the patient after therapy. They will call RN.

## 2015-10-23 NOTE — Progress Notes (Signed)
Occupational Therapy Session Note  Patient Details  Name: Sean Wilkerson MRN: [id number] Date of Birth: Nov 10, 1945  Today's Date: 10/23/2015 OT Individual Time: 0830-1000 OT Individual Time Calculation (min): 90 min    Short Term Goals: Week 1:  OT Short Term Goal 1 (Week 1): Pt will dress UB with mod A using hemi technique OT Short Term Goal 1 - Progress (Week 1): Met OT Short Term Goal 2 (Week 1): Pt will complete toilet transfer with max A +1 OT Short Term Goal 2 - Progress (Week 1): Progressing toward goal OT Short Term Goal 3 (Week 1): Pt will display carry over of hemi dressing technique with min VCs OT Short Term Goal 3 - Progress (Week 1): Met OT Short Term Goal 4 (Week 1): Pt will maintain static standing balance with min A in prep for LB dressing task OT Short Term Goal 4 - Progress (Week 1): Met OT Short Term Goal 5 (Week 1): Pt will utilize L UE during functional task with max A OT Short Term Goal 5 - Progress (Week 1): Progressing toward goal Week 2:  OT Short Term Goal 1 (Week 2): Pt will complete toilet transfer with max A + 1 OT Short Term Goal 2 (Week 2): Pt will complete toileting tasks with max A OT Short Term Goal 3 (Week 2): Pt will utilize LUE as stabilizer during functional tasks with max A OT Short Term Goal 4 (Week 2): Pt will complete LB dressing tasks with max A OT Short Term Goal 5 (Week 2): Pt will complete UB dressing tasks with min A      Skilled Therapeutic Interventions/Progress Updates:    Pt seen for skilled OT to facilitate ADL skills with a focus on dynamic balance, postural control and management of RUE.  Pt received in bed and worked on rolling on flat bed to R with mod A to sit up to EOB. Pt stated that his wife was planning to get a hospital bed.  Pt completed squat pivot transfers with min A from bed to w/c to shower bench back to w/c with min A but mod cues for positioning and set up. Pt used long sponge in shower to be able to wash L arm,  feet and part of his back. With dressing, he continues to need a great deal of A to don clothing over R foot due to heaviness of R leg.  Pt's wife arrived and was able to observe the rest of the session and participate in education. Pt demo his sit to stand using L hand on sink to stabilize himself in standing as therapist assisted with pants over hips. Educated wife on foot placement, forward wt shift to allow pt to stand with steadying A. Provided pt with [employer] sling to trial use of during the next week.  Pt fitted with sling and he stated he felt it give his arm support.   Pt and wife taken to ADL apt to discuss their bathroom layout and possible DME options to use at home. Wife will take some photos to show therapy team of their bathroom layout. Pt taken to gym to demonstrate to wife squat pivot transfer w/c to mat demonstrating foot placement, forward wt shifting, hand placement to support R arm. Pt transferred to mat with min A. Reviewed  Lateral scooting on mat. RUE facilitation with A/Arom to sh elevation and trace movement in sh flex. Educated wife on PROM and A/AROM to RUE. Encouraged pt to keep wearing the  sling as much as he could . Pt's SLP arrived for his next session.    Therapy Documentation Precautions:  Precautions Precautions: Fall Restrictions Weight Bearing Restrictions: No      Pain: Pain Assessment Pain Assessment: 0-10 Pain Score: 3  Pain Type: Acute pain Pain Location: Arm Pain Orientation: Right Pain Descriptors / Indicators: Aching Pain Onset: Gradual Pain Intervention(s): Repositioned (sling)   ADL: See Function Navigator for Current Functional Status.   Therapy/Group: Individual Therapy  [place] 10/23/2015, 12:02 PM

## 2015-10-23 NOTE — Progress Notes (Signed)
Physical Therapy Weekly Progress Note  Patient Details  Name: Sean Wilkerson MRN: [id number] Date of Birth: 11/23/1945  Beginning of progress report period: October 16, 2015 End of progress report period: October 23, 2015  Patient has met 4 of 5 short term goals. Patient currently propelling wheelchair via L hemi technique with supervision and requires min A for bed mobility, squat pivot transfers, and sit <> stand transfers with cues for safe setup and technique. Initiated family education with wife with plan for continued hands-on family training this week in preparation for discharge home.   Patient continues to demonstrate the following deficits: impaired sensation, proprioception, decreased strength and motor control, decreased postural control, impaired balance and decreased cardiovascular endurance and therefore will continue to benefit from skilled PT intervention to enhance overall performance with activity tolerance, balance, postural control, ability to compensate for deficits, functional use of  right upper extremity and right lower extremity, attention and coordination.  Patient progressing toward long term goals.  Plan of care revisions: DC'd ambulation goals.  PT Short Term Goals Week 2:  PT Short Term Goal 1 (Week 2): Patient will transfer bed <> wheelchair with min assist of one person. PT Short Term Goal 1 - Progress (Week 2): Met PT Short Term Goal 2 (Week 2): Patient will perform sit <> stand with consistent min A.  PT Short Term Goal 2 - Progress (Week 2): Met PT Short Term Goal 3 (Week 2): Patient will ambulate 35 ft with max assist of one person. PT Short Term Goal 3 - Progress (Week 2): Met PT Short Term Goal 4 (Week 2): Patient will perform stair negotiation of 4 steps with max assist. PT Short Term Goal 4 - Progress (Week 2): Progressing toward goal PT Short Term Goal 5 (Week 2): Patient will propel wheelchair via L hemi technique x 100 ft with supervision. PT  Short Term Goal 5 - Progress (Week 2): Met Week 3:  PT Short Term Goal 1 (Week 3): = LTGs due to anticipated LOS   Therapy Documentation Precautions:  Precautions Precautions: Fall Restrictions Weight Bearing Restrictions: No   Laretta Alstrom 10/23/2015, 9:47 PM

## 2015-10-23 NOTE — Progress Notes (Signed)
Occupational Therapy Session Note  Patient Details  Name: Sean Wilkerson MRN: [id number] Date of Birth: [birth year]  Today's Date: 10/23/2015 OT Individual Time: 1400-1430 OT Individual Time Calculation (min): 30 min    Short Term Goals: Week 2:  OT Short Term Goal 1 (Week 2): Pt will complete toilet transfer with max A + 1 OT Short Term Goal 2 (Week 2): Pt will complete toileting tasks with max A OT Short Term Goal 3 (Week 2): Pt will utilize LUE as stabilizer during functional tasks with max A OT Short Term Goal 4 (Week 2): Pt will complete LB dressing tasks with max A OT Short Term Goal 5 (Week 2): Pt will complete UB dressing tasks with min A  Skilled Therapeutic Interventions/Progress Updates:    1:1 NMES applied to supraspinatus and middle deltoid to help approximate shoulder joint to reduce sublux and reduce pain. Also focused on hands facilitation of scapular retraction, protraction, elevation and depression along with NMES to promote normal patterns of movement.   Ratio 1:1 Rate 35 pps Waveform- Asymmetric Ramp 1.0 Pulse 300 Intensity- 30 Duration - 30 mins  Report of pain at the beginning of session - 4/10 Report of pain at the end of session none during NMES and after  No adverse reactions after treatment and is skin intact.   Pt returned to bed at conclusion of session with min A squat pivot transfer to pt's right. Mod A for sit to supine.   Therapy Documentation Precautions:  Precautions Precautions: Fall Restrictions Weight Bearing Restrictions: No Pain:  no c/o pain in session   See Function Navigator for Current Functional Status.   Therapy/Group: Individual Therapy  [name], Jos[facility]amp[facility] 10/23/2015, 3:18 PM

## 2015-10-24 ENCOUNTER — Inpatient Hospital Stay (HOSPITAL_COMMUNITY): Payer: Medicare Other | Admitting: Physical Therapy

## 2015-10-24 ENCOUNTER — Inpatient Hospital Stay (HOSPITAL_COMMUNITY): Payer: Medicare Other | Admitting: Occupational Therapy

## 2015-10-24 ENCOUNTER — Inpatient Hospital Stay (HOSPITAL_COMMUNITY): Payer: Medicare Other | Admitting: *Deleted

## 2015-10-24 ENCOUNTER — Inpatient Hospital Stay (HOSPITAL_COMMUNITY): Payer: Medicare Other | Admitting: Speech Pathology

## 2015-10-24 LAB — GLUCOSE, CAPILLARY: Glucose-Capillary: 122 mg/dL — ABNORMAL HIGH (ref 65–99)

## 2015-10-24 NOTE — Progress Notes (Signed)
Occupational Therapy Weekly Progress Note  Patient Details  Name: Sean Wilkerson MRN: [id number] Date of Birth: September 16, 1946  Beginning of progress report period: October 16, 2015 End of progress report period: October 24, 2015  Patient has met 4 of 5 short term goals.  Pt made steady progress with BADLs this past week.  Pt continues to require mod verbal cues for safety and sequencing with transitional movements.  Pt dons a pull over shirt with supervision and extra time to complete task.  Pt is utilizing AE to assist with LB bathing and dressing tasks. Pt continues to require min verbal cues for use of reacher to assist with donning pants.  Pt performs squat pivot transfers with mod A and toilet transfers using grab bar with min A.  Pt requires tot A to use his RUE as a stabilizer during functional tasks.  Pt fatigues quickly and requires multiple rest breaks during a treatment session.  Family has not been present for therapy sessions. Patient continues to demonstrate the following deficits: dense R hemiplegia with decreased ability to manage RUE, decreased dynamic sitting balance, decreased activity tolerance, decreased carryover of new skills (although he is recalling hemidressing techniques) and therefore will continue to benefit from skilled OT intervention to enhance overall performance with BADL.  Patient progressing toward long term goals..  Continue plan of care.  OT Short Term Goals Week 2:  OT Short Term Goal 1 (Week 2): Pt will complete toilet transfer with max A + 1 OT Short Term Goal 1 - Progress (Week 2): Met OT Short Term Goal 2 (Week 2): Pt will complete toileting tasks with max A OT Short Term Goal 2 - Progress (Week 2): Met OT Short Term Goal 3 (Week 2): Pt will utilize LUE as stabilizer during functional tasks with max A OT Short Term Goal 3 - Progress (Week 2): Progressing toward goal OT Short Term Goal 4 (Week 2): Pt will complete LB dressing tasks with max A OT Short  Term Goal 4 - Progress (Week 2): Met OT Short Term Goal 5 (Week 2): Pt will complete UB dressing tasks with min A OT Short Term Goal 5 - Progress (Week 2): Met Week 3:  OT Short Term Goal 1 (Week 3): STG=LTG secondary to ELOS      Therapy Documentation Precautions:  Precautions Precautions: Fall Restrictions Weight Bearing Restrictions: No  See Function Navigator for Current Functional Status.   Leotis Shames [facility] 10/24/2015, 3:14 PM

## 2015-10-24 NOTE — Progress Notes (Signed)
Physical Therapy Session Note  Patient Details  Name: Sean Wilkerson MRN: [id number] Date of Birth: 11/18/1945  Today's Date: 10/24/2015 PT Co-Treatment Time: 1100-1130 PT Co-Treatment Time Calculation (min): 30 min  Short Term Goals: Week 2:  PT Short Term Goal 1 (Week 2): Patient will transfer bed <> wheelchair with min assist of one person. PT Short Term Goal 1 - Progress (Week 2): Met PT Short Term Goal 2 (Week 2): Patient will perform sit <> stand with consistent min A.  PT Short Term Goal 2 - Progress (Week 2): Met PT Short Term Goal 3 (Week 2): Patient will ambulate 35 ft with max assist of one person. PT Short Term Goal 3 - Progress (Week 2): Met PT Short Term Goal 4 (Week 2): Patient will perform stair negotiation of 4 steps with max assist. PT Short Term Goal 4 - Progress (Week 2): Progressing toward goal PT Short Term Goal 5 (Week 2): Patient will propel wheelchair via L hemi technique x 100 ft with supervision. PT Short Term Goal 5 - Progress (Week 2): Met    Therapy Documentation Precautions:  Precautions Precautions: Fall Restrictions Weight Bearing Restrictions: No   Patient received in [place] in therapy gym from [place]. Co-treatment performed with Speech therapist Loma Sousa total time with patient 60 min. Session focused on positional changes and functional mobility and diaphragmatic breathing with position changes specifically focused during supine, sitting and standing activities. Patient with increased tendancy to utilize accessory musculature anterior chest and upper traps.  Gait belt utilized around wait for feedback and initiation of diaphragmatic breathing. Patient stood four trials with use of right wide based quad cane with min assist. Squat pivot transfers to and from wheelchair and mat min assist. Short sit to and from supine on mat min assist. Moderate verbal cues from proper sequence and technique with all activity.     Therapy/Group:  Co-Treatment  Retta Diones 10/24/2015, 12:25 PM

## 2015-10-24 NOTE — Progress Notes (Signed)
Physical Therapy Session Note  Patient Details  Name: Sean BirchwoodLarry Lee Coiner MRN: [id number] Date of Birth: 06/27/1946  Today's Date: 10/24/2015 PT Individual Time: 0930-1100  PT Individual Time Calculation (min): 90 min   Short Term Goals: Week 3:  PT Short Term Goal 1 (Week 3): = LTGs due to anticipated LOS  Skilled Therapeutic Interventions/Progress Updates:   Session focused on functional transfers and mobility, weight shifting, trunk/core activation, R NMR, and activity tolerance. Patient propelled wheelchair via L hemi technique x 150 ft with supervision at slow pace, min cues for obstacle negotiation on R.   Block practice squat pivot transfer training wheelchair <> low compliant couch surface. Patient required min cues for sequencing and wheelchair set up and performed transfers to L on decline with min A and to R on incline with mod A. Therapist trialed different positions in front, [place] technique, and behind patient to determine best method for wife to assist patient.   Seated edge of mat, performed reaching with LUE for objects outside BOS to promote lateral trunk shortening/elongation and trunk rotation to place objects on R side of patient. Patient propped on R elbow to promote RUE WB over bolster and performed reaching tasks with LUE for lateral trunk ROM/activation. Sitting propped on elbow to L x 10 and to R with assist to maintain good shoulder alignment and cues for technique x 10.   Patient did not recall technique of crossing LLE under RLE to bring BLE onto mat table at same time and required assist to safely cross lower extremities to protect R ankle, min A for managing trunk and RUE with sit > supine to L. Supine NMR with ball between knees to facilitate co-contraction for bridging and homoklying hip adduction to fatigue and D1 RLE lower extremity PNF with focus on gross RLE extension, minimal activation noted and patient appeared apraxic with movement. Patient required max  multimodal cues to sequence supine > sit with assist to bend RLE and bring RUE across body, min A to weight shift sidelying > sit.   Sit <> stand at stairs with cues to push up from arm rest with min A. Performed standing weight shift task stepping LLE to 3" step using L rail for UE support with focus on maintaining upright posture, hip extension to neutral, and shifting weight forward onto LLE while stepping as patient tended to keep weight shifted posteriorly and unsafely stick out LLE to step. Patient required max-total multimodal cues for safe sequencing with difficulty following commands resulting in max-total A for balance and to prevent fall.  Patient tolerated session well but fatigued quickly with tasks and required multiple seated rest breaks. Patient left sitting in wheelchair to await SLP session.   Therapy Documentation Precautions:  Precautions Precautions: Fall Restrictions Weight Bearing Restrictions: No Pain: Pain Assessment Pain Assessment: No/denies pain   See Function Navigator for Current Functional Status.   Therapy/Group: Individual Therapy  [name], Lache Dagher A 10/24/2015, 12:22 PM

## 2015-10-24 NOTE — Progress Notes (Signed)
Occupational Therapy Session Note  Patient Details  Name: Adolphus BirchwoodLarry Lee Zent MRN: [id number] Date of Birth: 1946/07/19  Today's Date: 10/24/2015 OT Individual Time: 1300-1330 OT Individual Time Calculation (min): 30 min billed Unattended charge 30 min of estim (ran from 13:30-14:00)   Skilled Therapeutic Interventions/Progress Updates:    1:1 NMES applied to supraspinatus and middle deltoid to help approximate shoulder joint to reduce sublux and reduce pain. Along with NMR of right UE to facilitated scapular movement (protraction/ retraction/ elevation/ depression) in slide lying.  Able to elicited trace shoulder movement in side lying.     Ratio 1:1 Rate 35 pps Waveform- Asymmetric Ramp 1.0 Pulse 300 Intensity- 26 Duration - 30 min attended.   30 min unattended     Report of pain at the beginning of session none rated  Report of pain at the end of session none  No adverse reactions after treatment and is skin intact.    Therapy Documentation Precautions:  Precautions Precautions: Fall Restrictions Weight Bearing Restrictions: No   Vital Signs: Therapy Vitals Temp: 98.7 F (37.1 C) Temp Source: Oral Pulse Rate: 70 Resp: 18 BP: 131/67 mmHg Patient Position (if appropriate): Sitting Oxygen Therapy SpO2: 96 % O2 Device: Not Delivered Pain:   ADL:   Exercises:   Other Treatments:    See Function Navigator for Current Functional Status.   Therapy/Group: Individual Therapy  [name], Aaryana Betke [facility] 10/24/2015, 2:58 PM

## 2015-10-24 NOTE — Progress Notes (Signed)
Speech Language Pathology Weekly Progress and Session Note  Patient Details  Name: Sean Wilkerson MRN: [id number] Date of Birth: 01/26/1946  Beginning of progress report period: October 16, 2015 End of progress report period: October 24, 2015  Today's Date: 10/24/2015 SLP Co-Treatment Time: 1130 (co-tx with PT (RW) [phone] SLP Co-Treatment Time Calculation (min): 25 min  Short Term Goals: Week 2: SLP Short Term Goal 1 (Week 2): Patient will consume current diet with minimal overt s/s of aspiration with supervision verbal cues for use of small bites/sips via cup.  SLP Short Term Goal 1 - Progress (Week 2): Met SLP Short Term Goal 2 (Week 2): Patient will utilize speech intelligibility strategies at the phrase level with supervision verbal cues to self-monitor and correct errors.  SLP Short Term Goal 2 - Progress (Week 2): Not met SLP Short Term Goal 3 (Week 2): Patient will utilize diaphragmatic breathing at the word level with Min A verbal cues for accuracy and to self-monitor and correct errors.  SLP Short Term Goal 3 - Progress (Week 2): Not met    New Short Term Goals: Week 3: SLP Short Term Goal 1 (Week 3): Patient will consume current diet with minimal overt s/s of aspiration with Mod Ifor use of small bites/sips via cup.  SLP Short Term Goal 2 (Week 3): Patient will demonstrate efficient mastication with trials of regular textures over 2 consecutive sessions prior to upgrade with supervision verbal cues.  SLP Short Term Goal 3 (Week 3): Patient will utilize speech intelligibility strategies at the phrase level with supervision verbal cues to self-monitor and correct errors.  SLP Short Term Goal 4 (Week 3): Patient will utilize diaphragmatic breathing at the word level with Mod A verbal cues for accuracy and to self-monitor and correct errors.  SLP Short Term Goal 5 (Week 3): Patient will complete 25 repititions of IMST at 9 cm H2O with supervision verbal cues for accuracy  and a self-percieved levle of effort rating 7 or less on a 0-10 scale.   Weekly Progress Updates: Patient has made functional gains and has met 1 of 3 STG's this reporting period. Currently, patient is consuming Dys. 3 textures with thin liquids with minimal overt s/s of aspiration and requires supervision verbal cues for use of small bites/sips and a slow rate of self-feeding.  Patient is also consuming trials of regular textures and continues to demonstrate mild-moderate oral residue, therefore, recommend continued trials with SLP only prior to upgrade.  Patient requires Min A verbal cues for use of speech intelligibility strategies at the phrase level and Max A verbal and visual cues for use of diaphragmatic breathing at the word level. Patient is also performing RMT exercises with Min A verbal cues for accuracy. Patient and family education is ongoing. Patient would benefit from continued skill SLP intervention to maximize speech and swallowing function and overall functional independence prior to discharge.   Intensity: Minumum of 1-2 x/day, 30 to 90 minutes Frequency: 3 to 5 out of 7 days Duration/Length of Stay: 10/30/15 Treatment/Interventions: Cueing hierarchy;Functional tasks;Dysphagia/aspiration precaution training;Environmental controls;Speech/Language facilitation;Internal/external aids;Patient/family education;Therapeutic Activities   Daily Session  Skilled Therapeutic Interventions: Skilled co-treatment session with PT focused on diaphragmatic breathing with positional changes to maximize breath support to maximize speech intelligibility. Patient with increased tendency to utilize accessory musculature, anterior chest and upper traps throughout verbal expression and required Max-Total A verbal, visual, demonstration and tactile cues for use of diaphragm without verbal expression and at the word level. Diaphragmatic breathing was  achieved while patient standing with assistance while  singing but continues to require Max-Total A to carryover to verbal expression. Patient left upright in wheelchair with all needs within reach. Continue with current plan of care.    Function:   Cognition Comprehension Comprehension assist level: Understands basic 75 - 89% of the time/ requires cueing 10 - 24% of the time  Expression   Expression assist level: Expresses basic 75 - 89% of the time/requires cueing 10 - 24% of the time. Needs helper to occlude trach/needs to repeat words.  Social Interaction Social Interaction assist level: Interacts appropriately 90% of the time - Needs monitoring or encouragement for participation or interaction.  Problem Solving Problem solving assist level: Solves basic 90% of the time/requires cueing < 10% of the time  Memory Memory assist level: Recognizes or recalls 75 - 89% of the time/requires cueing 10 - 24% of the time   Pain No/Denies Pain   Therapy/Group: Individual Therapy  Mahayla Haddaway 10/24/2015, 2:51 PM

## 2015-10-24 NOTE — Progress Notes (Signed)
Occupational Therapy Session Note  Patient Details  Name: Sean BirchwoodLarry Lee Wilkerson MRN: [id number] Date of Birth: 03/21/1946  Today's Date: 10/24/2015 OT Individual Time: [id number] OT Individual Time Calculation (min): 60 min    Short Term Goals: Week 2:  OT Short Term Goal 1 (Week 2): Pt will complete toilet transfer with max A + 1 OT Short Term Goal 2 (Week 2): Pt will complete toileting tasks with max A OT Short Term Goal 3 (Week 2): Pt will utilize LUE as stabilizer during functional tasks with max A OT Short Term Goal 4 (Week 2): Pt will complete LB dressing tasks with max A OT Short Term Goal 5 (Week 2): Pt will complete UB dressing tasks with min A  Skilled Therapeutic Interventions/Progress Updates:    Pt engaged in BADL retraining including bathing at shower level and dressing with sit<>stand from w/c at sink.  Pt resting in bed upon arrival and performed supine->sit EOB with min A.  Pt continues to require min verbal cues for body position and sequencing with transitional movements.  Pt initially attempted a stand pivot transfer to w/c with a slight posterior lean noted.  Pt returned to sitting and performed a squat pivot transfer with mod A.  Pt performed w/c<>tub bench transfers with mod A using grab bar and mod verbal cues for safety and sequencing.  Pt uses long handle sponge to assist with bathing lower legs/feet.  Pt recalls correct hemi dressing techniques and was able to don shirt with supervision after shirt oriented for patient.  Pt's body habitus is limiting factor with LB dressing tasks but patient is exhibiting improvements with use of reacher to assist with threading pants.  Focus on activity tolerance, functional transfers, BADL retraining, sit<>stand, standing balance, and safety awareness to increase independence with BADLs.  Therapy Documentation Precautions:  Precautions Precautions: Fall Restrictions Weight Bearing Restrictions: No Pain:  Pt denied pain  See  Function Navigator for Current Functional Status.   Therapy/Group: Individual Therapy  Rich BraveLanier, Dekota Kirlin Chappell 10/24/2015, 8:53 AM

## 2015-10-24 NOTE — Progress Notes (Addendum)
Sean Wilkerson PHYSICAL MEDICINE & REHABILITATION     PROGRESS NOTE  Subjective/Complaints:  Right arm and leg feeling better today. Able to sleep last night  ROS:   Denies CP, SOB, N/V/D, anxiety, depression, no visual issues   Objective: Vital Signs: Blood pressure 141/64, pulse 65, temperature 97.7 F (36.5 C), temperature source Oral, resp. rate 18, weight 111.721 kg (246 lb 4.8 oz), SpO2 99 %. No results found. No results for input(s): WBC, HGB, HCT, PLT in the last 72 hours. No results for input(s): NA, K, CL, GLUCOSE, BUN, CREATININE, CALCIUM in the last 72 hours.  Invalid input(s): CO CBG (last 3)   Recent Labs  10/23/15 2056 10/24/15 0640  GLUCAP 106* 122*    Wt Readings from Last 3 Encounters:  10/22/15 111.721 kg (246 lb 4.8 oz)  10/05/15 115.5 kg (254 lb 10.1 oz)    Physical Exam:  BP 141/64 mmHg  Pulse 65  Temp(Src) 97.7 F (36.5 C) (Oral)  Resp 18  Wt 111.721 kg (246 lb 4.8 oz)  SpO2 99% Constitutional: He appears well-developed and well-nourished. NAD. Vital signs reviewed HENT: Normocephalic and atraumatic.  Mouth/Throat: Oropharynx is pink, clear and moist.  Eyes: Conjunctivae and EOM are normal. Right eye exhibits no discharge. Left eye exhibits no discharge.  Cardiovascular: Normal rate and regular rhythm.No murmur heard. Respiratory: Effort normal and breath sounds normal. No respiratory distress. He has no wheezes.  GI: Soft. Bowel sounds are normal. He exhibits no distension. There is no tenderness.  Musculoskeletal: He exhibits no edema. He exhibits no tenderness. 1+ edema RLE Neurological: He is alert and oriented.  Right hemifacial weakness.  Mild dysphonia and persistent moderate dysarthria  Has good awareness and insight into deficits.  LUE/LLE: 5/5 proximal to distal RUE: 0/5 prox to distal. RLE: 1/5 HE and KE, distally 0/5.  RUE: 0/5 prox to distal. RLE: 1/5 HE and KE, distally 0/5.  Sensation diminished to light touch and right  upper and right lower  Extremities (stable). Skin: Skin is warm and dry.  Psychiatric: He has a normal mood and affect. His behavior is normal. Judgment and thought content normal.   Assessment/Plan: 1. 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 Physiatrist is providing close team supervision and 24 hour management of active medical problems listed below. Physiatrist and rehab team continue to assess barriers to discharge/monitor patient progress toward functional and medical goals.  Function:  Bathing Bathing position   Position: Shower  Bathing parts Body parts bathed by patient: Chest, Abdomen, Front perineal area, Left upper leg, Right lower leg, Left lower leg, Right upper leg, Right arm, Left arm Body parts bathed by helper: Back, Buttocks  Bathing assist Assist Level: Touching or steadying assistance(Pt > 75%)      Upper Body Dressing/Undressing Upper body dressing   What is the patient wearing?: Pull over shirt/dress     Pull over shirt/dress - Perfomed by patient: Thread/unthread left sleeve, Put head through opening, Pull shirt over trunk, Thread/unthread right sleeve Pull over shirt/dress - Perfomed by helper: Thread/unthread right sleeve        Upper body assist Assist Level: Supervision or verbal cues      Lower Body Dressing/Undressing Lower body dressing   What is the patient wearing?: Pants, Socks, Shoes Underwear - Performed by patient: Thread/unthread left underwear leg Underwear - Performed by helper:  Pull underwear up/down, Thread/unthread right underwear leg Pants- Performed by patient: Thread/unthread left pants leg Pants- Performed by helper: Pull pants up/down, Thread/unthread right pants leg   Non-skid slipper socks- Performed by helper: Don/doff right sock,  Don/doff left sock   Socks - Performed by helper: Don/doff right sock, Don/doff left sock Shoes - Performed by patient: Don/doff left shoe Shoes - Performed by helper: Don/doff right shoe, Fasten right, Fasten left, Don/doff left shoe          Lower body assist Assist for lower body dressing: 2 Designer, multimedia activity did not occur: Safety/medical concerns   Toileting steps completed by helper: Adjust clothing prior to toileting, Performs perineal hygiene, Adjust clothing after toileting Toileting Assistive Devices: Grab bar or rail  Toileting assist Assist level: Two helpers   Transfers Chair/bed transfer   Chair/bed transfer method: Squat pivot Chair/bed transfer assist level: Touching or steadying assistance (Pt > 75%) Chair/bed transfer assistive device: Armrests Mechanical lift: Landscape architect     Max distance: 20 Assist level: 2 helpers   Wheelchair   Type: Manual Max wheelchair distance: 150 Assist Level: Supervision or verbal cues  Cognition Comprehension Comprehension assist level: Understands complex 90% of the time/cues 10% of the time  Expression Expression assist level: Expresses basic 75 - 89% of the time/requires cueing 10 - 24% of the time. Needs helper to occlude trach/needs to repeat words.  Social Interaction Social Interaction assist level: Interacts appropriately 90% of the time - Needs monitoring or encouragement for participation or interaction.  Problem Solving Problem solving assist level: Solves basic 90% of the time/requires cueing < 10% of the time  Memory Memory assist level: Recognizes or recalls 75 - 89% of the time/requires cueing 10 - 24% of the time   Medical Problem List and Plan: 1. 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 DVT Prophylaxis/Anticoagulation: Mechanical: Sequential  compression devices, below knee Bilateral lower extremities  -dopplers negative yesterday bilateral LE 3. Pain Management: Tylenol prn.  4. Mood: LCSW to follow for evaluation and support.  5. Neuropsych: This patient is capable of making decisions on hid own behalf. 6. Skin/Wound Care: Routine pressure relief measures.  7. Fluids/Electrolytes/Nutrition: Monitor I/O.   -encouraging PO 8. HTN: .  -Norvasc increased to 10mg  on 12/24  -Coreg increased to 25mg  on 1/2  -improved control 9. Dyslipidemia: Cotinue Lipitor daily.  10. Neuropathic pain:    -Gabapentin--reduced to 900 TID due to sedation.   -continue low dose pamelor at HS to help with sleep + pain   11. Constipation  -Improved  LOS (Days) 16 A FACE TO FACE EVALUATION WAS PERFORMED  Sean Wilkerson T 10/24/2015 9:01 AM

## 2015-10-25 NOTE — Progress Notes (Signed)
[facility] PHYSICAL MEDICINE & REHABILITATION     PROGRESS NOTE  Subjective/Complaints:  Right arm and leg feeling better today. Able to sleep last night  ROS:   Denies CP, SOB, N/V/D, anxiety, depression, no visual issues   Objective: Vital Signs: Blood pressure 131/67, pulse 70, temperature 98 F (36.7 C), temperature source Oral, resp. rate 20, weight 111.721 kg (246 lb 4.8 oz), SpO2 100 %. No results found. No results for input(s): WBC, HGB, HCT, PLT in the last 72 hours. No results for input(s): NA, K, CL, GLUCOSE, BUN, CREATININE, CALCIUM in the last 72 hours.  Invalid input(s): CO CBG (last 3)   Recent Labs  10/23/15 2056 10/24/15 0640  GLUCAP 106* 122*    Wt Readings from Last 3 Encounters:  10/22/15 111.721 kg (246 lb 4.8 oz)  10/05/15 115.5 kg (254 lb 10.1 oz)    Physical Exam:  BP 131/67 mmHg  Pulse 70  Temp(Src) 98 F (36.7 C) (Oral)  Resp 20  Wt 111.721 kg (246 lb 4.8 oz)  SpO2 100% Constitutional: He appears well-developed and well-nourished. NAD. Vital signs reviewed HENT: Normocephalic and atraumatic.  Mouth/Throat: Oropharynx is pink, clear and moist.  Eyes: Conjunctivae and EOM are normal. Right eye exhibits no discharge. Left eye exhibits no discharge.  Cardiovascular: Normal rate and regular rhythm.No murmur heard. Respiratory: Effort normal and breath sounds normal. No respiratory distress. He has no wheezes.  GI: Soft. Bowel sounds are normal. He exhibits no distension. There is no tenderness.  Musculoskeletal: He exhibits no edema. He exhibits no tenderness. 1+ edema RLE Neurological: He is alert and oriented.  Right hemifacial weakness.  Mild dysphonia and persistent moderate dysarthria  Has good awareness and insight into deficits.  LUE/LLE: 5/5 proximal to distal RUE: 0/5 prox to distal. RLE: 1/5 HE and KE, distally 0/5.  RUE: 0/5 prox to distal. RLE: 1/5 HE and KE, distally 0/5.  Sensation diminished to light touch and right upper  and right lower  Extremities (stable). Skin: Skin is warm and dry.  Psychiatric: He has a normal mood and affect. His behavior is normal. Judgment and thought content normal.   Assessment/Plan: 1. 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 Physiatrist is providing close team supervision and 24 hour management of active medical problems listed below. Physiatrist and rehab team continue to assess barriers to discharge/monitor patient progress toward functional and medical goals.  Function:  Bathing Bathing position   Position: Shower  Bathing parts Body parts bathed by patient: Chest, Abdomen, Front perineal area, Left upper leg, Right lower leg, Left lower leg, Right upper leg, Right arm, Left arm Body parts bathed by helper: Back, Buttocks  Bathing assist Assist Level: Touching or steadying assistance(Pt > 75%)      Upper Body Dressing/Undressing Upper body dressing   What is the patient wearing?: Pull over shirt/dress     Pull over shirt/dress - Perfomed by patient: Thread/unthread left sleeve, Put head through opening, Pull shirt over trunk, Thread/unthread right sleeve Pull over shirt/dress - Perfomed by helper: Thread/unthread right sleeve        Upper body assist Assist Level: Supervision or verbal cues      Lower Body Dressing/Undressing Lower body dressing   What is the patient wearing?: Pants, Socks, Shoes Underwear - Performed by patient: Thread/unthread left underwear leg Underwear - Performed by helper:  Pull underwear up/down, Thread/unthread right underwear leg Pants- Performed by patient: Thread/unthread left pants leg Pants- Performed by helper: Pull pants up/down, Thread/unthread right pants leg   Non-skid slipper socks- Performed by helper: Don/doff right sock,  Don/doff left sock   Socks - Performed by helper: Don/doff right sock, Don/doff left sock Shoes - Performed by patient: Don/doff left shoe Shoes - Performed by helper: Don/doff right shoe, Fasten right, Fasten left, Don/doff left shoe          Lower body assist Assist for lower body dressing: 2 Designer, multimediaHelpers      Toileting Toileting Toileting activity did not occur: No continent bowel/bladder event   Toileting steps completed by helper: Adjust clothing prior to toileting, Performs perineal hygiene, Adjust clothing after toileting Toileting Assistive Devices: Grab bar or rail  Toileting assist Assist level: Two helpers   Transfers Chair/bed transfer   Chair/bed transfer method: Squat pivot Chair/bed transfer assist level: Moderate assist (Pt 50 - 74%/lift or lower) Chair/bed transfer assistive device: Armrests Mechanical lift: Landscape architecttedy   Locomotion Ambulation     Max distance: 20 Assist level: 2 helpers   Wheelchair   Type: Manual Max wheelchair distance: 150 Assist Level: Supervision or verbal cues  Cognition Comprehension Comprehension assist level: Understands basic 75 - 89% of the time/ requires cueing 10 - 24% of the time  Expression Expression assist level: Expresses basic 75 - 89% of the time/requires cueing 10 - 24% of the time. Needs helper to occlude trach/needs to repeat words.  Social Interaction Social Interaction assist level: Interacts appropriately 90% of the time - Needs monitoring or encouragement for participation or interaction.  Problem Solving Problem solving assist level: Solves basic 90% of the time/requires cueing < 10% of the time  Memory Memory assist level: Recognizes or recalls 75 - 89% of the time/requires cueing 10 - 24% of the time   Medical Problem List and Plan: 1. 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 DVT  Prophylaxis/Anticoagulation: Mechanical: Sequential compression devices, below knee Bilateral lower extremities  -dopplers negative yesterday bilateral LE 3. Pain Management: Tylenol prn.  4. Mood: LCSW to follow for evaluation and support.  5. Neuropsych: This patient is capable of making decisions on hid own behalf. 6. Skin/Wound Care: Routine pressure relief measures.  7. Fluids/Electrolytes/Nutrition: Monitor I/O.   -encouraging PO 8. HTN: .  -Norvasc increased to 10mg  on 12/24  -Coreg increased to 25mg  on 1/2  -improved control 9. Dyslipidemia: Cotinue Lipitor daily.  10. Neuropathic pain:    -Gabapentin--reduced to 900 TID due to sedation.   -continue low dose pamelor at HS to help with sleep + pain   11. Constipation  -Improved  LOS (Days) 17 A FACE TO FACE EVALUATION WAS PERFORMED  Sean Wilkerson,Charlee Squibb E 10/25/2015 11:48 AM

## 2015-10-26 ENCOUNTER — Inpatient Hospital Stay (HOSPITAL_COMMUNITY): Payer: Medicare Other

## 2015-10-26 NOTE — Progress Notes (Signed)
Occupational Therapy Note  Patient Details  Name: Sean Wilkerson MRN: [id number] Date of Birth: 1946-07-08  Today's Date: 10/26/2015 OT Individual Time: 1430-1500 OT Individual Time Calculation (min): 30 min   Pt c/o 2/10 pain in right shoulder; repositioned Individual Therapy  Pt engaged in RUE NMR including weight bearing while reaching with LUE across midline.  Pt also engaged in facilitated right shoulder flexion with hand resting on balanced stool.  Pt engaged in right scapula elevation, retraction, protraction, and depression with manual facilitation.     Sean Wilkerson, Sean Wilkerson Sean Wilkerson 10/26/2015, 3:02 PM

## 2015-10-26 NOTE — Progress Notes (Signed)
[place] PHYSICAL MEDICINE & REHABILITATION     PROGRESS NOTE  Subjective/Complaints:  No issues overnight  ROS:   Denies CP, SOB, N/V/D, anxiety, depression, no visual issues   Objective: Vital Signs: Blood pressure 119/57, pulse 65, temperature 98.5 F (36.9 C), temperature source Oral, resp. rate 18, weight 111.721 kg (246 lb 4.8 oz), SpO2 100 %. No results found. No results for input(s): WBC, HGB, HCT, PLT in the last 72 hours. No results for input(s): NA, K, CL, GLUCOSE, BUN, CREATININE, CALCIUM in the last 72 hours.  Invalid input(s): CO CBG (last 3)   Recent Labs  10/23/15 2056 10/24/15 0640  GLUCAP 106* 122*    Wt Readings from Last 3 Encounters:  10/22/15 111.721 kg (246 lb 4.8 oz)  10/05/15 115.5 kg (254 lb 10.1 oz)    Physical Exam:  BP 119/57 mmHg  Pulse 65  Temp(Src) 98.5 F (36.9 C) (Oral)  Resp 18  Wt 111.721 kg (246 lb 4.8 oz)  SpO2 100% Constitutional: He appears well-developed and well-nourished. NAD. Vital signs reviewed HENT: Normocephalic and atraumatic.  Mouth/Throat: Oropharynx is pink, clear and moist.  Eyes: Conjunctivae and EOM are normal. Right eye exhibits no discharge. Left eye exhibits no discharge.  Cardiovascular: Normal rate and regular rhythm.No murmur heard. Respiratory: Effort normal and breath sounds normal. No respiratory distress. He has no wheezes.  GI: Soft. Bowel sounds are normal. He exhibits no distension. There is no tenderness.  Musculoskeletal: He exhibits no edema. He exhibits no tenderness. 1+ edema RLE Neurological: He is alert and oriented.  Right hemifacial weakness.  Mild dysphonia and persistent moderate dysarthria  Has good awareness and insight into deficits.  LUE/LLE: 5/5 proximal to distal RUE: 0/5 prox to distal. RLE: 1/5 HE and KE, distally 0/5.  RUE: 0/5 prox to distal. RLE: 1/5 HE and KE, distally 0/5.  Sensation diminished to light touch and right upper and right lower  Extremities  (stable). Skin: Skin is warm and dry.  Psychiatric: He has a normal mood and affect. His behavior is normal. Judgment and thought content normal.   Assessment/Plan: 1. 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 Physiatrist is providing close team supervision and 24 hour management of active medical problems listed below. Physiatrist and rehab team continue to assess barriers to discharge/monitor patient progress toward functional and medical goals.  Function:  Bathing Bathing position   Position: Shower  Bathing parts Body parts bathed by patient: Chest, Abdomen, Front perineal area, Left upper leg, Right lower leg, Left lower leg, Right upper leg, Right arm, Left arm Body parts bathed by helper: Back, Buttocks  Bathing assist Assist Level: Touching or steadying assistance(Pt > 75%)      Upper Body Dressing/Undressing Upper body dressing   What is the patient wearing?: Pull over shirt/dress     Pull over shirt/dress - Perfomed by patient: Thread/unthread left sleeve, Put head through opening, Pull shirt over trunk, Thread/unthread right sleeve Pull over shirt/dress - Perfomed by helper: Thread/unthread right sleeve        Upper body assist Assist Level: Supervision or verbal cues      Lower Body Dressing/Undressing Lower body dressing   What is the patient wearing?: Pants, Socks, Shoes Underwear - Performed by patient: Thread/unthread left underwear leg Underwear - Performed by helper: Pull underwear up/down, Thread/unthread right underwear leg Pants- Performed  by patient: Thread/unthread left pants leg Pants- Performed by helper: Pull pants up/down, Thread/unthread right pants leg   Non-skid slipper socks- Performed by helper: Don/doff right sock, Don/doff left sock   Socks -  Performed by helper: Don/doff right sock, Don/doff left sock Shoes - Performed by patient: Don/doff left shoe Shoes - Performed by helper: Don/doff right shoe, Fasten right, Fasten left, Don/doff left shoe          Lower body assist Assist for lower body dressing: 2 Designer, multimediaHelpers      Toileting Toileting Toileting activity did not occur: No continent bowel/bladder event   Toileting steps completed by helper: Adjust clothing prior to toileting, Performs perineal hygiene, Adjust clothing after toileting Toileting Assistive Devices: Grab bar or rail  Toileting assist Assist level: Two helpers   Transfers Chair/bed transfer   Chair/bed transfer method: Squat pivot Chair/bed transfer assist level: Moderate assist (Pt 50 - 74%/lift or lower) Chair/bed transfer assistive device: Armrests Mechanical lift: Landscape architecttedy   Locomotion Ambulation     Max distance: 20 Assist level: 2 helpers   Wheelchair   Type: Manual Max wheelchair distance: 150 Assist Level: Supervision or verbal cues  Cognition Comprehension Comprehension assist level: Understands basic 75 - 89% of the time/ requires cueing 10 - 24% of the time  Expression Expression assist level: Expresses basic 75 - 89% of the time/requires cueing 10 - 24% of the time. Needs helper to occlude trach/needs to repeat words.  Social Interaction Social Interaction assist level: Interacts appropriately 90% of the time - Needs monitoring or encouragement for participation or interaction.  Problem Solving Problem solving assist level: Solves basic 90% of the time/requires cueing < 10% of the time  Memory Memory assist level: Recognizes or recalls 75 - 89% of the time/requires cueing 10 - 24% of the time   Medical Problem List and Plan: 1. 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 DVT Prophylaxis/Anticoagulation: Mechanical:  Sequential compression devices, below knee Bilateral lower extremities  -dopplers negative yesterday bilateral LE 3. Pain Management: Tylenol prn.  4. Mood: LCSW to follow for evaluation and support.  5. Neuropsych: This patient is capable of making decisions on hid own behalf. 6. Skin/Wound Care: Routine pressure relief measures.  7. Fluids/Electrolytes/Nutrition: Monitor I/O.   -encouraging PO 8. HTN: .119/57  -Norvasc increased to 10mg  on 12/24  -Coreg increased to 25mg  on 1/2  -improved control 9. Dyslipidemia: Cotinue Lipitor daily.  10. Neuropathic pain:    -Gabapentin--reduced to 900 TID due to sedation.   -continue low dose pamelor at HS to help with sleep + pain   11. Constipation  -Improved  LOS (Days) 18 A FACE TO FACE EVALUATION WAS PERFORMED  Claudette LawsKIRSTEINS,Aksel Bencomo E 10/26/2015 11:18 AM

## 2015-10-27 ENCOUNTER — Inpatient Hospital Stay (HOSPITAL_COMMUNITY): Payer: Medicare Other | Admitting: *Deleted

## 2015-10-27 ENCOUNTER — Inpatient Hospital Stay (HOSPITAL_COMMUNITY): Payer: Medicare Other | Admitting: Occupational Therapy

## 2015-10-27 ENCOUNTER — Inpatient Hospital Stay (HOSPITAL_COMMUNITY): Payer: Medicare Other | Admitting: Speech Pathology

## 2015-10-27 ENCOUNTER — Inpatient Hospital Stay (HOSPITAL_COMMUNITY): Payer: Medicare Other

## 2015-10-27 NOTE — Progress Notes (Signed)
Occupational Therapy Session Note  Patient Details  Name: Sean BirchwoodLarry Lee Greenley MRN: [id number] Date of Birth: 1946/02/21  Today's Date: 10/27/2015 OT Individual Time: [id number] OT Individual Time Calculation (min): 30 min    Short Term Goals: Week 3:  OT Short Term Goal 1 (Week 3): STG=LTG secondary to ELOS  Skilled Therapeutic Interventions/Progress Updates:    Treatment session with focus on walk-in shower transfer training to prepare for d/c home.  Completed simulated transfer from w/c to tub bench over simulated walk-in shower box to simulate 3" ledge entry.  Pt reports walk-in shower has swinging door and that wife is to measure door width and dimensions of shower.  Pt required mod assist with transfer with tactile cues for anterior weight shift and manual facilitation for lifting.  Question if pt will be able to complete shower transfers with current layout of shower and necessary equipment, recommend further assessment of setup by HHOT.  Throughout transfer training NMES completed on Rt shoulder.  NMES applied to supraspinatus and middle deltoid to help approximate shoulder joint to reduce sublux and reduce pain.   Ratio 1:1 Rate 35 pps Waveform- Asymmetric Ramp 1.0 Pulse 300 Intensity- 23 Duration -  15 mins  Report of pain at the beginning of session 4/10 Report of pain at the end of session 2/10  No adverse reactions after treatment and is skin intact.    Therapy Documentation Precautions:  Precautions Precautions: Fall Restrictions Weight Bearing Restrictions: No General:   Vital Signs: Therapy Vitals Temp: 97.6 F (36.4 C) Temp Source: Oral Pulse Rate: 72 Resp: 18 BP: (!) 146/65 mmHg Patient Position (if appropriate): Sitting Oxygen Therapy SpO2: 100 % O2 Device: Not Delivered Pain: Pain Assessment Pain Assessment: No/denies pain  See Function Navigator for Current Functional Status.   Therapy/Group: Individual Therapy  Rosalio LoudHOXIE, Lakely Elmendorf 10/27/2015,  4:07 PM

## 2015-10-27 NOTE — Progress Notes (Signed)
Speech Language Pathology Daily Session Note  Patient Details  Name: Sean Wilkerson MRN: [id number] Date of Birth: 29-Dec-1945  Today's Date: 10/27/2015 SLP Individual Time: 0932-1030 SLP Individual Time Calculation (min): 58 min  Short Term Goals: Week 3: SLP Short Term Goal 1 (Week 3): Patient will consume current diet with minimal overt s/s of aspiration with Mod Ifor use of small bites/sips via cup.  SLP Short Term Goal 2 (Week 3): Patient will demonstrate efficient mastication with trials of regular textures over 2 consecutive sessions prior to upgrade with supervision verbal cues.  SLP Short Term Goal 3 (Week 3): Patient will utilize speech intelligibility strategies at the phrase level with supervision verbal cues to self-monitor and correct errors.  SLP Short Term Goal 4 (Week 3): Patient will utilize diaphragmatic breathing at the word level with Mod A verbal cues for accuracy and to self-monitor and correct errors.  SLP Short Term Goal 5 (Week 3): Patient will complete 25 repititions of IMST at 9 cm H2O with supervision verbal cues for accuracy and a self-percieved levle of effort rating 7 or less on a 0-10 scale.   Skilled Therapeutic Interventions: Skilled treatment session focused on addressing speech goals.  SLP facilitated session by providing verbal and visual education for coordination of breath support with the onset of phonation.  Patient required Mod assist verbal and visual cues for carryover in a structured task phrase-sentence task, which increased intelligibility to ~80% accuracy.  Patient continues to primarily utilize accessory mucsles for breath support and reports having difficulty understanding and utilizing diaphragmatic breathing.  SLP also facilitated session by providing Min assist verbal cues for accuracy with use of IMST device. Patient performed IMST exercises at 9 cm H2O for 3 sets of 5 reps with a self-perceived effort level of 5/10 so therefore last 2 sets of  5, difficulty was increased to 11 cm H2O with a reported effort of 8/10. Continue with current plan of care.   Function:  Cognition Comprehension Comprehension assist level: Understands basic 75 - 89% of the time/ requires cueing 10 - 24% of the time  Expression   Expression assist level: Expresses basic 50 - 74% of the time/requires cueing 25 - 49% of the time. Needs to repeat parts of sentences.  Social Interaction Social Interaction assist level: Interacts appropriately 90% of the time - Needs monitoring or encouragement for participation or interaction.  Problem Solving Problem solving assist level: Solves basic 25 - 49% of the time - needs direction more than half the time to initiate, plan or complete simple activities  Memory Memory assist level: Recognizes or recalls 75 - 89% of the time/requires cueing 10 - 24% of the time    Pain Pain Assessment Pain Assessment: No/denies pain  Therapy/Group: Individual Therapy  Charlane FerrettiMelissa Porter Nakama, M.A., CCC-SLP [phone]  Priscilla Kirstein 10/27/2015, 4:45 PM

## 2015-10-27 NOTE — Progress Notes (Signed)
Occupational Therapy Session Note  Patient Details  Name: Sean Wilkerson MRN: [id number] Date of Birth: 1945-11-24  Today's Date: 10/27/2015 OT Individual Time: 0700-0800 OT Individual Time Calculation (min): 60 min    Short Term Goals: Week 3:  OT Short Term Goal 1 (Week 3): STG=LTG secondary to ELOS  Skilled Therapeutic Interventions/Progress Updates:    Pt engaged in BADL retraining including bed mobility, functional transfers, bathing at shower level, and dressing with sit<>stand from w/c at sink, and standing balance to facilitate assistance with LB dressing.  Pt continues to require more than a reasonable amount of time with multiple rest breaks.  Pt requires increased assistance with LB dressing tasks using reacher to assist with donning pants.  Pt uses long handle sponge to assist with LB bathing in shower.  Pt requires hand over hand assist to use RUE during bathing tasks.   Therapy Documentation Precautions:  Precautions Precautions: Fall Restrictions Weight Bearing Restrictions: No Pain:  Pt denied pain  See Function Navigator for Current Functional Status.   Therapy/Group: Individual Therapy  Rich BraveLanier, Sean Wilkerson 10/27/2015, 8:01 AM

## 2015-10-27 NOTE — Progress Notes (Signed)
[place] PHYSICAL MEDICINE & REHABILITATION     PROGRESS NOTE  Subjective/Complaints:  No issues this weekend. Missed seeing family (weather). Pain better on right side.  ROS:   Denies CP, SOB, N/V/D, anxiety, depression, no visual issues   Objective: Vital Signs: Blood pressure 133/67, pulse 67, temperature 97.9 F (36.6 C), temperature source Oral, resp. rate 18, weight 111.721 kg (246 lb 4.8 oz), SpO2 99 %. No results found. No results for input(s): WBC, HGB, HCT, PLT in the last 72 hours. No results for input(s): NA, K, CL, GLUCOSE, BUN, CREATININE, CALCIUM in the last 72 hours.  Invalid input(s): CO CBG (last 3)  No results for input(s): GLUCAP in the last 72 hours.  Wt Readings from Last 3 Encounters:  10/22/15 111.721 kg (246 lb 4.8 oz)  10/05/15 115.5 kg (254 lb 10.1 oz)    Physical Exam:  BP 133/67 mmHg  Pulse 67  Temp(Src) 97.9 F (36.6 C) (Oral)  Resp 18  Wt 111.721 kg (246 lb 4.8 oz)  SpO2 99% Constitutional: He appears well-developed and well-nourished. NAD. Vital signs reviewed HENT: Normocephalic and atraumatic.  Mouth/Throat: Oropharynx is pink, clear and moist.  Eyes: Conjunctivae and EOM are normal. Right eye exhibits no discharge. Left eye exhibits no discharge.  Cardiovascular: Normal rate and regular rhythm.No murmur heard. Respiratory: Effort normal and breath sounds normal. No respiratory distress. He has no wheezes.  GI: Soft. Bowel sounds are normal. He exhibits no distension. There is no tenderness.  Musculoskeletal: He exhibits no edema. He exhibits no tenderness. 1+ edema RLE Neurological: He is alert and oriented.  Right hemifacial weakness.  Mild dysphonia and persistent moderate dysarthria make him difficult to understand unless he slows down.  Has good awareness and insight into deficits.  LUE/LLE: 5/5 proximal to distal RUE: 0/5 prox to distal. RLE: 1/5 HE and KE, distally 0/5.  RUE: 0/5 prox to distal. RLE: 1/5 HE and KE,  distally 0/5.  Sensation diminished to light touch and right upper and right lower  Extremities (stable). Skin: Skin is warm and dry.  Psychiatric: He has a normal mood and affect. His behavior is normal. Judgment and thought content normal.   Assessment/Plan: 1. 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 Physiatrist is providing close team supervision and 24 hour management of active medical problems listed below. Physiatrist and rehab team continue to assess barriers to discharge/monitor patient progress toward functional and medical goals.  Function:  Bathing Bathing position   Position: Shower  Bathing parts Body parts bathed by patient: Chest, Abdomen, Front perineal area, Left upper leg, Right lower leg, Left lower leg, Right upper leg, Right arm, Left arm Body parts bathed by helper: Back, Buttocks  Bathing assist Assist Level: Touching or steadying assistance(Pt > 75%)      Upper Body Dressing/Undressing Upper body dressing   What is the patient wearing?: Pull over shirt/dress     Pull over shirt/dress - Perfomed by patient: Thread/unthread left sleeve, Put head through opening, Pull shirt over trunk, Thread/unthread right sleeve Pull over shirt/dress - Perfomed by helper: Thread/unthread right sleeve        Upper body assist Assist Level: Supervision or verbal cues      Lower Body Dressing/Undressing Lower body dressing   What is the patient wearing?: Pants, Socks, Shoes, Underwear Underwear - Performed by patient: Thread/unthread left  underwear leg Underwear - Performed by helper: Pull underwear up/down, Thread/unthread right underwear leg Pants- Performed by patient: Thread/unthread left pants leg, Thread/unthread right pants leg Pants- Performed by helper: Pull pants  up/down   Non-skid slipper socks- Performed by helper: Don/doff right sock, Don/doff left sock   Socks - Performed by helper: Don/doff right sock, Don/doff left sock Shoes - Performed by patient: Don/doff left shoe Shoes - Performed by helper: Don/doff right shoe, Fasten right, Fasten left, Don/doff left shoe          Lower body assist Assist for lower body dressing: 2 Designer, multimediaHelpers      Toileting Toileting Toileting activity did not occur: No continent bowel/bladder event   Toileting steps completed by helper: Adjust clothing prior to toileting, Performs perineal hygiene, Adjust clothing after toileting Toileting Assistive Devices: Grab bar or rail  Toileting assist Assist level: Two helpers   Transfers Chair/bed transfer   Chair/bed transfer method: Squat pivot Chair/bed transfer assist level: Moderate assist (Pt 50 - 74%/lift or lower) Chair/bed transfer assistive device: Armrests Mechanical lift: Landscape architecttedy   Locomotion Ambulation     Max distance: 20 Assist level: 2 helpers   Wheelchair   Type: Manual Max wheelchair distance: 150 Assist Level: Supervision or verbal cues  Cognition Comprehension Comprehension assist level: Understands basic 75 - 89% of the time/ requires cueing 10 - 24% of the time  Expression Expression assist level: Expresses basic 75 - 89% of the time/requires cueing 10 - 24% of the time. Needs helper to occlude trach/needs to repeat words.  Social Interaction Social Interaction assist level: Interacts appropriately 90% of the time - Needs monitoring or encouragement for participation or interaction.  Problem Solving Problem solving assist level: Solves basic 75 - 89% of the time/requires cueing 10 - 24% of the time  Memory Memory assist level: Recognizes or recalls 75 - 89% of the time/requires cueing 10 - 24% of the time   Medical Problem List and Plan: 1. Hemiplegia, dysarthria, dysphagia secondary to left corona radiata, external capsule, and posterior  left lentiform nuclei infarct  - Continue CIR - D/C planned 1/12--on track 2. DVT Prophylaxis/Anticoagulation: Mechanical: Sequential compression devices, below knee Bilateral lower extremities  -dopplers negative yesterday bilateral LE 3. Pain Management: Tylenol prn.  4. Mood: LCSW to follow for evaluation and support.  5. Neuropsych: This patient is capable of making decisions on hid own behalf. 6. Skin/Wound Care: Routine pressure relief measures.  7. Fluids/Electrolytes/Nutrition: Monitor I/O.   -encouraging PO 8. HTN: .141/72  -Norvasc increased to 10mg  on 12/24  -Coreg increased to 25mg  on 1/2  -improved control 9. Dyslipidemia: Cotinue Lipitor daily.  10. Neuropathic pain:    -Gabapentin--900mg  tid.   -continue low dose pamelor at HS to help with sleep + pain   11. Constipation  -Improved  LOS (Days) 19 A FACE TO FACE EVALUATION WAS PERFORMED  SWARTZ,ZACHARY T 10/27/2015 9:37 AM

## 2015-10-27 NOTE — Progress Notes (Signed)
Physical Therapy Session Note  Patient Details  Name: Sean BirchwoodLarry Lee Wilkerson MRN: [id number] Date of Birth: 03/27/1946  Today's Date: 10/27/2015 PT Individual Time: 1191-47821408-1505 PT Individual Time Calculation (min): 57 min   Short Term Goals: Week 3:  PT Short Term Goal 1 (Week 3): = LTGs due to anticipated LOS    Skilled Therapeutic Interventions/Progress Updates:  W/c> mat to R with min cues for set-up, squat pivot with min assist.  neuromuscular re-education via VCS, demo, manual cues, forced use in supported sitting>unsupported sitting, supported standing for trunk shortening/lengthening/rotating during dual task of labelled food bean bags, sorting by category. Pt used R hand as gross assist.  100% accuracy for categorization of food items.  Pt stood x 7 minutes during bean bag task, with intermittent assistance for R knee control.  Family ed with wife. She performed mat><w/c to L and R squat pivots with pt, and managed w/c parts.    W/c propulsion using hemi method to return to room. Pt needed mod assist to back into area next to bed. Pt left resting in w/c with all needs iwthin reach, family present. Therapy Documentation Precautions:  Precautions Precautions: Fall Restrictions Weight Bearing Restrictions: No   Pain: Pain Assessment Pain Assessment: No/denies pain    See Function Navigator for Current Functional Status.   Therapy/Group: Individual Therapy  Shelba Susi 10/27/2015, 3:34 PM

## 2015-10-28 ENCOUNTER — Inpatient Hospital Stay (HOSPITAL_COMMUNITY): Payer: Medicare Other | Admitting: Occupational Therapy

## 2015-10-28 ENCOUNTER — Inpatient Hospital Stay (HOSPITAL_COMMUNITY): Payer: Medicare Other | Admitting: Speech Pathology

## 2015-10-28 ENCOUNTER — Ambulatory Visit (HOSPITAL_COMMUNITY): Payer: Medicare Other | Admitting: *Deleted

## 2015-10-28 ENCOUNTER — Inpatient Hospital Stay (HOSPITAL_COMMUNITY): Payer: Medicare Other

## 2015-10-28 NOTE — Progress Notes (Signed)
[place] PHYSICAL MEDICINE & REHABILITATION     PROGRESS NOTE  Subjective/Complaints:  Had a good night. Anxious to hear what DC date is. Right arm feeling ok.  ROS:   Denies CP, SOB, N/V/D, anxiety, depression, no visual issues   Objective: Vital Signs: Blood pressure 131/63, pulse 70, temperature 98 F (36.7 C), temperature source Oral, resp. rate 18, weight 111.721 kg (246 lb 4.8 oz), SpO2 100 %. No results found. No results for input(s): WBC, HGB, HCT, PLT in the last 72 hours. No results for input(s): NA, K, CL, GLUCOSE, BUN, CREATININE, CALCIUM in the last 72 hours.  Invalid input(s): CO CBG (last 3)  No results for input(s): GLUCAP in the last 72 hours.  Wt Readings from Last 3 Encounters:  10/22/15 111.721 kg (246 lb 4.8 oz)  10/05/15 115.5 kg (254 lb 10.1 oz)    Physical Exam:  BP 131/63 mmHg  Pulse 70  Temp(Src) 98 F (36.7 C) (Oral)  Resp 18  Wt 111.721 kg (246 lb 4.8 oz)  SpO2 100% Constitutional: He appears well-developed and well-nourished. NAD. Vital signs reviewed HENT: Normocephalic and atraumatic.  Mouth/Throat: Oropharynx is pink, clear and moist.  Eyes: Conjunctivae and EOM are normal. Right eye exhibits no discharge. Left eye exhibits no discharge.  Cardiovascular: Normal rate and regular rhythm.No murmur heard. Respiratory: Effort normal and breath sounds normal. No respiratory distress. He has no wheezes.  GI: Soft. Bowel sounds are normal. He exhibits no distension. There is no tenderness.  Musculoskeletal: He exhibits no edema. He exhibits no tenderness. 1+ edema RLE, 1/4" sublux right shoulder Neurological: He is alert and oriented.  Right hemifacial weakness.  Mild dysphonia and persistent moderate dysarthria make him difficult to understand unless he slows down.  Has good awareness and insight into deficits.  LUE/LLE: 5/5 proximal to distal RUE: 0/5 prox to distal. RLE: 1/5 HE and KE, distally 0/5.  RUE: 0/5 prox to distal. RLE: 1/5 HE  and KE, distally 0/5.  Sensation diminished to light touch and right upper and right lower  Extremities (stable). Skin: Skin is warm and dry.  Psychiatric: He has a normal mood and affect. His behavior is normal. Judgment and thought content normal.   Assessment/Plan: 1. 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 Physiatrist is providing close team supervision and 24 hour management of active medical problems listed below. Physiatrist and rehab team continue to assess barriers to discharge/monitor patient progress toward functional and medical goals.  Function:  Bathing Bathing position   Position: Shower  Bathing parts Body parts bathed by patient: Right arm, Left arm, Chest, Abdomen, Front perineal area, Right upper leg, Left upper leg, Right lower leg, Left lower leg Body parts bathed by helper: Buttocks, Back  Bathing assist Assist Level: Touching or steadying assistance(Pt > 75%)      Upper Body Dressing/Undressing Upper body dressing   What is the patient wearing?: Pull over shirt/dress     Pull over shirt/dress - Perfomed by patient: Thread/unthread left sleeve, Put head through opening, Pull shirt over trunk, Thread/unthread right sleeve Pull over shirt/dress - Perfomed by helper: Thread/unthread right sleeve        Upper body assist Assist Level: Supervision or verbal cues      Lower Body Dressing/Undressing Lower body dressing   What is the patient wearing?: Pants, Socks, Shoes, Underwear Underwear -  Performed by patient: Thread/unthread right underwear leg, Thread/unthread left underwear leg Underwear - Performed by helper: Pull underwear up/down Pants- Performed by patient: Thread/unthread right pants leg, Thread/unthread left pants leg Pants- Performed by helper: Pull  pants up/down   Non-skid slipper socks- Performed by helper: Don/doff right sock, Don/doff left sock   Socks - Performed by helper: Don/doff right sock, Don/doff left sock Shoes - Performed by patient: Don/doff left shoe Shoes - Performed by helper: Fasten right, Fasten left, Don/doff right shoe          Lower body assist Assist for lower body dressing: 2 Helpers      Financial trader activity did not occur: No continent bowel/bladder event   Toileting steps completed by helper: Adjust clothing prior to toileting, Performs perineal hygiene, Adjust clothing after toileting Toileting Assistive Devices: Grab bar or rail  Toileting assist Assist level: Two helpers   Transfers Chair/bed transfer   Chair/bed transfer method: Squat pivot Chair/bed transfer assist level: Moderate assist (Pt 50 - 74%/lift or lower) Chair/bed transfer assistive device: Armrests Mechanical lift: Landscape architect     Max distance: 20 Assist level: 2 helpers   Wheelchair   Type: Manual Max wheelchair distance: 150 Assist Level: Supervision or verbal cues  Cognition Comprehension Comprehension assist level: Understands basic 75 - 89% of the time/ requires cueing 10 - 24% of the time  Expression Expression assist level: Expresses basic 50 - 74% of the time/requires cueing 25 - 49% of the time. Needs to repeat parts of sentences.  Social Interaction Social Interaction assist level: Interacts appropriately 90% of the time - Needs monitoring or encouragement for participation or interaction.  Problem Solving Problem solving assist level: Solves basic 25 - 49% of the time - needs direction more than half the time to initiate, plan or complete simple activities  Memory Memory assist level: Recognizes or recalls 75 - 89% of the time/requires cueing 10 - 24% of the time   Medical Problem List and Plan: 1. Hemiplegia, dysarthria, dysphagia secondary to left corona radiata, external  capsule, and posterior left lentiform nuclei infarct  - Continue CIR - D/C planned 1/12- team conference today 2. DVT Prophylaxis/Anticoagulation: Mechanical: Sequential compression devices, below knee Bilateral lower extremities  -dopplers negative yesterday bilateral LE 3. Pain Management: Tylenol prn.  4. Mood: LCSW to follow for evaluation and support.  5. Neuropsych: This patient is capable of making decisions on hid own behalf. 6. Skin/Wound Care: Routine pressure relief measures.  7. Fluids/Electrolytes/Nutrition: Monitor I/O.   -encouraging PO 8. HTN: .   -Norvasc increased to 10mg  on 12/24  -Coreg increased to 25mg  on 1/2  -improved control overall  9. Dyslipidemia: Cotinue Lipitor daily.  10. Neuropathic pain:    -Gabapentin--900mg  tid.   -continue low dose pamelor at HS to help with sleep + pain   11. Constipation  -Improved  LOS (Days) 20 A FACE TO FACE EVALUATION WAS PERFORMED  SWARTZ,ZACHARY T 10/28/2015 9:18 AM

## 2015-10-28 NOTE — Progress Notes (Signed)
Physical Therapy Session Note  Patient Details  Name: Sean Wilkerson MRN: [id number] Date of Birth: 18-Mar-1946  Today's Date: 10/28/2015 PT Individual Time: 1000-1100 PT Individual Time Calculation (min): 60 min   Short Term Goals: Week 3:  PT Short Term Goal 1 (Week 3): = LTGs due to anticipated LOS  Skilled Therapeutic Interventions/Progress Updates:   Focus on squat pivot transfers with patient directing setup with min A overall to R and L, standing postural control with reaching task to promote weight shifting to RLE, lateral trunk shortening/elongation, midline positioning with mirror for visual feedback transitioned to seated reaching task due to fatigue with wedge under L hip to promote optimal alignment, reaching task to facilitate squat position with manual facilitation to promote equal weightbearing BLE, and wheelchair propulsion back to room via L hemi technique with supervision and increased time. Patient required multiple seated rest breaks due to fatigue, during rest breaks provided patient education regarding importance and purpose of postural control and midline positioning in functional tasks, patient verbalized understanding. Patient left sitting in wheelchair with all needs within reach and 1/2 lap tray in place. Patient with no concerns regarding upcoming discharge home.   Therapy Documentation Precautions:  Precautions Precautions: Fall Restrictions Weight Bearing Restrictions: No Pain: Pain Assessment Pain Assessment: 0-10 Pain Score: 2  Pain Type: Acute pain Pain Location: Shoulder Pain Orientation: Right Pain Descriptors / Indicators: Aching Pain Onset: Other (Comment) Pain Intervention(s): Repositioned  See Function Navigator for Current Functional Status.   Therapy/Group: Individual Therapy  [name], [name] 10/28/2015, 11:40 AM

## 2015-10-28 NOTE — Progress Notes (Signed)
Occupational Therapy Session Note  Patient Details  Name: Sean Wilkerson MRN: [id number] Date of Birth: 1946/04/06  Today's Date: 10/28/2015 OT Individual Time: 0700-0800 OT Individual Time Calculation (min): 60 min    Short Term Goals: Week 3:  OT Short Term Goal 1 (Week 3): STG=LTG secondary to ELOS  Skilled Therapeutic Interventions/Progress Updates:    Pt resting in bed upon arrival.  Pt engaged in BADL retraining including bathing at shower level and dressing with sit<>stand from w/c at sink.  Pt performed all functional transfers this morning with min A and min verbal cues for positioning and sequencing.  Pt demonstrated and return demonstrated UB bathing techniques while seated on tub bench.  Pt continues to require assistance with bathing buttocks.  Pt used long handle sponge to assist with bathing feet.  Pt used reacher to assist with threading pants and was successful with threading BLE into pants.  Pt performs sit<>stand with min A and maintains standing with min A and verbal cues for weight shifts.  Focus on functional transfers, sitting balance, sit<>stand, standing balance, hemi dressing/bathing techniques, and safety awareness to increase independence with BADLs.  Therapy Documentation Precautions:  Precautions Precautions: Fall Restrictions Weight Bearing Restrictions: No  Pain: Pain Assessment Pain Assessment: 0-10 Pain Score: 2  Pain Type: Acute pain Pain Location: Shoulder Pain Orientation: Right Pain Descriptors / Indicators: Aching Pain Intervention(s): RN made aware;Repositioned  See Function Navigator for Current Functional Status.   Therapy/Group: Individual Therapy  Rich BraveLanier, Lakeysha Slutsky Chappell 10/28/2015, 8:00 AM

## 2015-10-28 NOTE — Progress Notes (Signed)
Occupational Therapy Note  Patient Details  Name: Sean Wilkerson MRN: [id number] Date of Birth: 1946-08-04  Today's Date: 10/28/2015 OT Individual Time: 0900-0930 OT Individual Time Calculation (min): 30 min   1:1 self care retraining with focus on transfer training (to the right and left) to different surfaces- including couch, bed and mat with focus on recall of proper hand and feet placement. Pt able to complete with min A with extra time and tactile cues for weight shift forward.   Unattended charge: NMES applied to supraspinatus and middle deltoid to help approximate shoulder joint to reduce sublux and reduce pain.   Ratio 1:1 Rate 35 pps Waveform- Asymmetric Ramp 1.0 Pulse 300 Intensity- 26 Duration -   30min  Report of pain at the beginning of session 2/10 Report of pain at the end of session- 2/10  No adverse reactions after treatment and is skin intact.    [name], [name] 10/28/2015, 9:43 AM

## 2015-10-28 NOTE — Plan of Care (Signed)
Problem: RH Dressing Goal: LTG Patient will perform lower body dressing w/assist (OT) LTG: Patient will perform lower body dressing with assist, with/without cues in positioning using equipment (OT)  LTG has been downgraded from min A to mod A due to decreased standing balance.  Problem: RH Toileting Goal: LTG Patient will perform toileting w/assist, cues/equip (OT) LTG: Patient will perform toiletiing (clothes management/hygiene) with assist, with/without cues using equipment (OT)  LTG has been downgraded from min A to mod A due to decreased standing balance.

## 2015-10-28 NOTE — Progress Notes (Signed)
Recreational Therapy Session Note  Patient Details  Name: Sean BirchwoodLarry Lee Hallett MRN: [id number] Date of Birth: 11-22-1945 Today's Date: 10/28/2015  Pain: no c/o Skilled Therapeutic Interventions/Progress Updates: Session focused on activity tolerance, dynamic standing balance, midline stance, weight bearing through RLE & R knee control during reaching activity.  Pt required 3 seated rest breaks during session due to fatigue.  Pt is anxious to go home this week.  Therapy/Group: Co-Treatment   Meta Kroenke 10/28/2015, 3:34 PM

## 2015-10-28 NOTE — Patient Care Conference (Signed)
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 Slow transit constipation   . Neuropathic pain   . Stroke, acute, thrombotic (HCC) 10/08/2015  . 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 Essential hypertension 10/08/2015  . HLD (hyperlipidemia) 10/08/2015  . Hemiplegia and hemiparesis following cerebral infarction affecting right dominant side (HCC)   . Right hemiplegia (HCC) 10/06/2015  . Stroke (cerebrum) (HCC) 10/05/2015  . CVA (cerebral infarction) 10/05/2015    Expected Discharge Date: Expected Discharge Date: 10/30/15  Team Members Present: Physician leading conference: Dr. Faith RogueZachary Swartz Social Worker Present: Amada JupiterLucy Genella Bas, LCSW Nurse Present: Carmie EndAngie Joyce, RN PT Present: Bayard Huggerebecca Varner, Nita SicklePT;Rodney Wishart, PT OT Present: Ardis Rowanom [name], COTA;Jennifer Katrinka BlazingSmith, OT SLP Present: Feliberto Gottronourtney Payne, SLP PPS Coordinator present : Tora DuckMarie Noel, RN, CRRN     Current Status/Progress Goal Weekly Team Focus  Medical   pain control much better. bp's have levelled off. still with dense right hemiparesis  maintain ROM, positioning of RUE and RLE, improve functiona movement of right side  bp control, pain mgt, secondary stroke prevention ed   Bowel/Bladder   Continent of bowel and bladder; LBM 10/27/2015  Pt to remain continent of bowel and bladder  Monitor    Swallow/Nutrition/ Hydration   Dys.3 textures and thin liquids with Supervision for use of strategies   Mod I with least restrictive diet  trials of regular textures   ADL's   bathing-min A; UB dressing-supervision/min A; LB dressing-mod A; functional transfers-min A/mod A; toileting-max A  min A overall  functional transfers, BADL retraining, toileting, family education   Mobility   min A squat pivot transfers, supervision wheelchair mobility  min A transfers, supervision w/c  R NMR, postural control, standing balance, activity tolerance, patient.family education   Communication   Min-Mod assist   Mod I at sentence level  RMT, diaphragmatic breathing, use of strategies to maximize speech intelligibility   Safety/Cognition/ Behavioral Observations            Pain   No c/o pain  <3  assess q 4hr and prn. Assess for nonverbal cues of pain   Skin   CDI   Remain CDI  assess skin q shift and prn    Rehab Goals Patient on target to meet rehab goals: Yes *See Care Plan and progress notes for long and short-term goals.  Barriers to Discharge: dense right hemiparesis    Possible Resolutions to Barriers:  NMR, adpative equipment, education    Discharge Planning/Teaching Needs:  Home with family able to provide 24/7 assistance  Further family ed with wife scheduled for Wed 10-12   Team Discussion:  Team without any concerns.  Pt on target to meet all goals.  Family ed to finish up tomorrow and pt on track for d/c Thursday.  Revisions to Treatment Plan:  None   Continued Need for Acute Rehabilitation  Level of Care: The patient requires daily medical management by a physician with specialized training in physical medicine and rehabilitation for the following conditions: Daily direction of a multidisciplinary physical rehabilitation program to ensure safe treatment while eliciting the highest outcome that is of practical value to the patient.: Yes Daily medical management of patient  stability for increased activity during participation in an intensive rehabilitation regime.: Yes Daily analysis of laboratory values and/or radiology reports with any subsequent need for medication adjustment of medical intervention for : Neurological problems;Blood pressure problems  Sean Wilkerson 10/29/2015, 1:12 PM

## 2015-10-28 NOTE — Progress Notes (Signed)
Speech Language Pathology Daily Session Note  Patient Details  Name: Sean Wilkerson MRN: [id number] Date of Birth: 08/29/1946  Today's Date: 10/28/2015 SLP Individual Time: 1405-1505 SLP Individual Time Calculation (min): 60 min  Short Term Goals: Week 3: SLP Short Term Goal 1 (Week 3): Patient will consume current diet with minimal overt s/s of aspiration with Mod Ifor use of small bites/sips via cup.  SLP Short Term Goal 2 (Week 3): Patient will demonstrate efficient mastication with trials of regular textures over 2 consecutive sessions prior to upgrade with supervision verbal cues.  SLP Short Term Goal 3 (Week 3): Patient will utilize speech intelligibility strategies at the phrase level with supervision verbal cues to self-monitor and correct errors.  SLP Short Term Goal 4 (Week 3): Patient will utilize diaphragmatic breathing at the word level with Mod A verbal cues for accuracy and to self-monitor and correct errors.  SLP Short Term Goal 5 (Week 3): Patient will complete 25 repititions of IMST at 9 cm H2O with supervision verbal cues for accuracy and a self-percieved levle of effort rating 7 or less on a 0-10 scale.   Skilled Therapeutic Interventions: Skilled treatment session focused on addressing speech goals. SLP facilitated session with Min-Mod assist verbal and visual cues for accurate participation with obtaining measurements for IMST and EMST devices.  Patient's MIP was 24 cm H2O which is well below the LLN of 51.8 cm H2O and patient's MEP was 27 cm H2O which is also below the LLN of 60.56 cm H2O. Patient reported no dizziness this session.  Patient then performed IMST exercises at 17 cm H2O for 5 sets of 5 reps with a self-perceived effort level of 6/10.  Will continue to address IMST carryover prior to initiation of a EMST device.  Patient re-educated on diaphragmatic breathing techniques and completed some approximations in sitting following Max assist verbal, visual and  tactile cues.  Recommend to carryover in supine with a book in next session.  Continue with current plan of care.   Function:  Cognition Comprehension Comprehension assist level: Understands basic 90% of the time/cues < 10% of the time  Expression   Expression assist level: Expresses basic 50 - 74% of the time/requires cueing 25 - 49% of the time. Needs to repeat parts of sentences.  Social Interaction Social Interaction assist level: Interacts appropriately with others - No medications needed.  Problem Solving Problem solving assist level: Solves basic 25 - 49% of the time - needs direction more than half the time to initiate, plan or complete simple activities  Memory Memory assist level: Recognizes or recalls 75 - 89% of the time/requires cueing 10 - 24% of the time    Pain Pain Assessment Pain Assessment: No/denies pain  Therapy/Group: Individual Therapy  Charlane FerrettiMelissa Ithzel Fedorchak, M.A., CCC-SLP [phone]  Sean Wilkerson 10/28/2015, 3:50 PM

## 2015-10-29 ENCOUNTER — Inpatient Hospital Stay (HOSPITAL_COMMUNITY): Payer: Medicare Other | Admitting: Speech Pathology

## 2015-10-29 ENCOUNTER — Inpatient Hospital Stay (HOSPITAL_COMMUNITY): Payer: Medicare Other | Admitting: Physical Therapy

## 2015-10-29 ENCOUNTER — Inpatient Hospital Stay (HOSPITAL_COMMUNITY): Payer: Medicare Other

## 2015-10-29 MED ORDER — GABAPENTIN 800 MG PO TABS
800.0000 mg | ORAL_TABLET | Freq: Three times a day (TID) | ORAL | Status: DC
  Filled 2015-10-29 (×4): qty 1

## 2015-10-29 MED ORDER — GABAPENTIN 400 MG PO CAPS
800.0000 mg | ORAL_CAPSULE | Freq: Three times a day (TID) | ORAL | Status: DC
  Administered 2015-10-29 – 2015-10-30 (×4): 800 mg via ORAL
  Filled 2015-10-29 (×3): qty 2

## 2015-10-29 NOTE — Progress Notes (Signed)
Patient A/O, no noted distress. Tolerated meds well. RUE/RLE flaccid, edema noted as well. Administered sleeping regimen, effective. Patient slept well. Staff will continue to monitor and meet needs.

## 2015-10-29 NOTE — Progress Notes (Signed)
Physical Therapy Session Note  Patient Details  Name: Loyalty Brashier MRN: [id number] Date of Birth: [birth year]  Today's Date: 10/29/2015 PT Individual Time: 1301-1339 PT Individual Time Calculation (min): 38 min   Short Term Goals: Week 3:  PT Short Term Goal 1 (Week 3): = LTGs due to anticipated LOS    Therapy Documentation Precautions:  Precautions Precautions: Fall Restrictions Weight Bearing Restrictions: No  Transfers: Sit to and from stand transfer with minimum assist and left hemiwalker; verbal cues for foot placement, even weight distribution and controlled descent.   Patient ambulated 20 feet with left hemiwalker and mod assistx1; +2 for wheelchair follow and safety.   Patient up and down 4 steps ( 3 inches) with left handrail max assist. Patient descended stairs backwards. Verbal cues throughout for proper sequence and technique. Patient unable to advance RLE on and off step.   Patient's spouse present for family training with emphasis on bed mobility and commode transfer. Spouse performed all aspects of bed mobility with patient; providing min assist. Spouse performed squat pivot and NDT transfer to and from bed and wheelchair and wheelechair and commode providing min assist. Spouse demonstrated proper safety, sequence, technique and verbal cues in order to ensure a safefty for herself and patient.   Patient tolerated treatment well. Patient returned to room at end of session with all needs met resting comfortably in chair at bedside.    See Function Navigator for Current Functional Status.   Therapy/Group: Individual Therapy  Retta Diones 10/29/2015, 2:39 PM

## 2015-10-29 NOTE — Progress Notes (Signed)
Occupational Therapy Note  Patient Details  Name: Sean Wilkerson MRN: [id number] Date of Birth: Jul 23, 1946  Today's Date: 10/29/2015 OT Individual Time: 1400-1430 OT Individual Time Calculation (min): 30 min   Pt c/o 3/10 pain in right shoulder; repositioned Individual therapy  Pt engaged in RUE NMR seated unsupported on mat.  Trace shoulder flexion noted.  Pt also engaged in practicing squat pivot transfers including w/c set up, sequencing, and safety awareness.  Pt completes squat pivot transfers with min A.   Lavone NeriLanier, Kahne Helfand [facility]Ch[facility]ell 10/29/2015, 2:44 PM

## 2015-10-29 NOTE — Progress Notes (Signed)
[facility] PHYSICAL MEDICINE & REHABILITATION     PROGRESS NOTE  Subjective/Complaints:  Feeling well. Excited that he's nearing discharge..  ROS:   Denies CP, SOB, N/V/D, anxiety, depression, no visual issues   Objective: Vital Signs: Blood pressure 146/71, pulse 64, temperature 98.7 F (37.1 C), temperature source Oral, resp. rate 18, weight 111.721 kg (246 lb 4.8 oz), SpO2 97 %. No results found. No results for input(s): WBC, HGB, HCT, PLT in the last 72 hours. No results for input(s): NA, K, CL, GLUCOSE, BUN, CREATININE, CALCIUM in the last 72 hours.  Invalid input(s): CO CBG (last 3)  No results for input(s): GLUCAP in the last 72 hours.  Wt Readings from Last 3 Encounters:  10/22/15 111.721 kg (246 lb 4.8 oz)  10/05/15 115.5 kg (254 lb 10.1 oz)    Physical Exam:  BP 146/71 mmHg  Pulse 64  Temp(Src) 98.7 F (37.1 C) (Oral)  Resp 18  Wt 111.721 kg (246 lb 4.8 oz)  SpO2 97% Constitutional: He appears well-developed and well-nourished. NAD. Vital signs reviewed HENT: Normocephalic and atraumatic.  Mouth/Throat: Oropharynx is pink, clear and moist.  Eyes: Conjunctivae and EOM are normal. Right eye exhibits no discharge. Left eye exhibits no discharge.  Cardiovascular: Normal rate and regular rhythm.No murmur heard. Respiratory: Effort normal and breath sounds normal. No respiratory distress. He has no wheezes.  GI: Soft. Bowel sounds are normal. He exhibits no distension. There is no tenderness.  Musculoskeletal: He exhibits no edema. He exhibits no tenderness. 1+ edema RLE, 1/4" sublux right shoulder Neurological: He is alert and oriented.  Right hemifacial weakness.  Mild dysphonia and persistent moderate dysarthria make him difficult to understand unless he slows down.  Has good awareness and insight into deficits.  LUE/LLE: 5/5 proximal to distal RUE: 0/5 prox to distal. RLE: 1/5 HE and KE, distally 0/5.  RUE: 0/5 prox to distal. RLE: 1/5 HE and KE, trace  APF Sensation diminished to light touch and right upper and right lower  Extremities (stable). Skin: Skin is warm and dry.  Psychiatric: He has a normal mood and affect. His behavior is normal. Judgment and thought content normal.   Assessment/Plan: 1. 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 Physiatrist is providing close team supervision and 24 hour management of active medical problems listed below. Physiatrist and rehab team continue to assess barriers to discharge/monitor patient progress toward functional and medical goals.  Function:  Bathing Bathing position   Position: Shower  Bathing parts Body parts bathed by patient: Right arm, Left arm, Chest, Abdomen, Front perineal area, Right upper leg, Left upper leg, Right lower leg, Left lower leg Body parts bathed by helper: Buttocks, Back  Bathing assist Assist Level: Touching or steadying assistance(Pt > 75%)      Upper Body Dressing/Undressing Upper body dressing   What is the patient wearing?: Pull over shirt/dress     Pull over shirt/dress - Perfomed by patient: Thread/unthread left sleeve, Put head through opening, Pull shirt over trunk, Thread/unthread right sleeve Pull over shirt/dress - Perfomed by helper: Thread/unthread right sleeve        Upper body assist Assist Level: Supervision or verbal cues      Lower Body Dressing/Undressing Lower body dressing   What is the patient wearing?: Pants, Socks, Shoes, Underwear Underwear - Performed by patient: Thread/unthread right underwear leg, Thread/unthread  left underwear leg Underwear - Performed by helper: Pull underwear up/down Pants- Performed by patient: Thread/unthread right pants leg, Thread/unthread left pants leg Pants- Performed by helper: Pull pants up/down    Non-skid slipper socks- Performed by helper: Don/doff right sock, Don/doff left sock   Socks - Performed by helper: Don/doff right sock, Don/doff left sock Shoes - Performed by patient: Don/doff left shoe Shoes - Performed by helper: Fasten right, Fasten left, Don/doff right shoe          Lower body assist Assist for lower body dressing: 2 Designer, multimedia activity did not occur: No continent bowel/bladder event   Toileting steps completed by helper: Adjust clothing prior to toileting, Performs perineal hygiene, Adjust clothing after toileting Toileting Assistive Devices: Grab bar or rail  Toileting assist Assist level: Two helpers   Transfers Chair/bed transfer   Chair/bed transfer method: Squat pivot Chair/bed transfer assist level: Touching or steadying assistance (Pt > 75%) Chair/bed transfer assistive device: Armrests Mechanical lift: Landscape architect     Max distance: 20 Assist level: 2 helpers   Wheelchair   Type: Manual Max wheelchair distance: 150 Assist Level: Supervision or verbal cues  Cognition Comprehension Comprehension assist level: Understands basic 90% of the time/cues < 10% of the time  Expression Expression assist level: Expresses basic 50 - 74% of the time/requires cueing 25 - 49% of the time. Needs to repeat parts of sentences.  Social Interaction Social Interaction assist level: Interacts appropriately with others - No medications needed.  Problem Solving Problem solving assist level: Solves basic 25 - 49% of the time - needs direction more than half the time to initiate, plan or complete simple activities  Memory Memory assist level: Recognizes or recalls 75 - 89% of the time/requires cueing 10 - 24% of the time   Medical Problem List and Plan: 1. Hemiplegia, dysarthria, dysphagia secondary to left corona radiata, external capsule, and posterior left lentiform nuclei infarct  - Continue CIR - D/C planned  1/12- family ed prior to dc 2. DVT Prophylaxis/Anticoagulation: Mechanical: Sequential compression devices, below knee Bilateral lower extremities  -dopplers negative  bilateral LE 3. Pain Management: Tylenol prn.  4. Mood: LCSW to follow for evaluation and support.  5. Neuropsych: This patient is capable of making decisions on hid own behalf. 6. Skin/Wound Care: Routine pressure relief measures.  7. Fluids/Electrolytes/Nutrition: Monitor I/O.   -encouraging PO 8. HTN: .   -Norvasc increased to 10mg  on 12/24  -Coreg increased to 25mg  on 1/2  -improved control overall --no further changes prior to dc 9. Dyslipidemia: Cotinue Lipitor daily.  10. Neuropathic pain:    -Gabapentin--900mg  tid. ----decrease to 800mg   -continue low dose pamelor at HS which has helped pain   11. Constipation  -Improved  LOS (Days) 21 A FACE TO FACE EVALUATION WAS PERFORMED  SWARTZ,ZACHARY T 10/29/2015 8:58 AM

## 2015-10-29 NOTE — Progress Notes (Signed)
Physical Therapy Discharge Summary  Patient Details  Name: Sean Wilkerson MRN: [id number] Date of Birth: 1945/11/12  Today's Date: 10/29/2015 PT Individual Time: 1110-1210   60 minutes   Patient has met 7 of 8 long term goals due to improved activity tolerance, improved balance, improved postural control, increased strength, decreased pain, ability to compensate for deficits, functional use of  left upper extremity and left lower extremity, improved attention, improved awareness and improved coordination.  Patient to discharge at a wheelchair level [place]. Patient's care partner is independent to provide the necessary physical assistance at discharge.  Reasons goals not met: Patient continues to require mod A with simulated car transfer to sedan height.   Recommendation:  Patient will benefit from ongoing skilled PT services in home health setting to continue to advance safe functional mobility, address ongoing impairments in strength, motor control, coordination, proprioception, postural control, sitting/standing balance, balance strategies, cardiovascular endurance and activity tolerance and minimize fall risk.  Equipment: hospital bed, manual wheelchair and Jay2 wheelchair cushion  Reasons for discharge: treatment goals met and discharge from hospital  Patient/family agrees with progress made and goals achieved: Yes  Skilled Therapeutic Intervention Focus on family training with wife in preparation for planned discharge home tomorrow with wife providing all aspects of care for wheelchair mobility with supervision, bed <> wheelchair transfers to level surfaces with min A, simulated car transfer to sedan height with mod A, bed mobility on hospital bed with max cues for sequencing/technique from therapist and min A, DME needs and home entry as patient's wife reports ramp installation has been delayed by inclement weather, and wheelchair parts/management. Patient and wife declined need  to perform actual car transfer, recommend need for 2 people to assist with car transfers for safety. Per CSW, local fire department confirmed availability to assist with getting patient into house as ramp installation has been delayed. Plan for further bed mobility training and transfers to drop arm commode with wife for PT session this afternoon. Patient left sitting in wheelchair with all needs within reach, wife in room.   PT Discharge Precautions/Restrictions Precautions Precautions: Fall Restrictions Weight Bearing Restrictions: No Pain  Denies pain Vision/Perception   No change from baseline  Cognition Overall Cognitive Status: Within Functional Limits for tasks assessed Arousal/Alertness: Awake/alert Orientation Level: Oriented X4 Attention: Sustained Sustained Attention: Appears intact Memory: Appears intact Awareness: Appears intact Problem Solving: Appears intact Behaviors: Impulsive Safety/Judgment: Appears intact Sensation Sensation Light Touch: Impaired Detail Light Touch Impaired Details: Impaired RUE;Impaired RLE Stereognosis: Not tested Hot/Cold: Appears Intact Coordination Gross Motor Movements are Fluid and Coordinated: No Fine Motor Movements are Fluid and Coordinated: No Coordination and Movement Description: RUE/RLE hemiplegia Motor  Motor Motor: Hemiplegia  Mobility Transfers Transfers: Yes Sit to Stand: 4: Min assist;With upper extremity assist Stand to Sit: 4: Min assist;With upper extremity assist Squat Pivot Transfers: 4: Min assist;With Animator Details: Verbal cues for sequencing;Verbal cues for technique;Manual facilitation for placement;Verbal cues for precautions/safety Locomotion  Ambulation Ambulation: Yes Ambulation/Gait Assistance: 2: Max assist;1: +2 Total assist (+2 for w/c follow) Ambulation Distance (Feet): 20 Feet Assistive device: Hemi-walker Ambulation/Gait Assistance Details: Verbal cues for  sequencing;Verbal cues for technique;Verbal cues for gait pattern;Manual facilitation for weight shifting;Manual facilitation for placement;Manual facilitation for weight bearing;Tactile cues for posture Gait Gait: No Gait Pattern: Impaired Gait Pattern: Step-through pattern;Trunk flexed;Narrow base of support;Poor foot clearance - right Gait velocity: decreased Stairs / Additional Locomotion Stairs: Yes Stairs Assistance: 2: Max assist Stair Management Technique:  One rail Left;Step to pattern;Forwards;Backwards Number of Stairs: 4 Height of Stairs: 3 Ramp: Not tested (comment) Curb: Not tested (comment) Wheelchair Mobility Wheelchair Mobility: Yes Wheelchair Assistance: 5: Supervision Wheelchair Propulsion: Left lower extremity;Left upper extremity Wheelchair Parts Management: Needs assistance Distance: 150 ft  Trunk/Postural Assessment  Cervical Assessment Cervical Assessment: Within Functional Limits Thoracic Assessment Thoracic Assessment: Within Functional Limits Lumbar Assessment Lumbar Assessment: Within Functional Limits Postural Control Postural Control: Deficits on evaluation Righting Reactions: impaired Protective Responses: impaired  Balance Balance Balance Assessed: Yes Static Sitting Balance Static Sitting - Balance Support: Feet supported;Left upper extremity supported Static Sitting - Level of Assistance: 6: Modified independent (Device/Increase time) Dynamic Sitting Balance Dynamic Sitting - Balance Support: Feet supported;During functional activity Dynamic Sitting - Level of Assistance: 6: Modified independent (Device/Increase time) Static Standing Balance Static Standing - Balance Support: During functional activity;No upper extremity supported Static Standing - Level of Assistance: 4: Min assist Dynamic Standing Balance Dynamic Standing - Balance Support: During functional activity Dynamic Standing - Level of Assistance: 4: Min assist Extremity  Assessment  RUE Assessment RUE Assessment: Exceptions to [facility] RUE AROM (degrees) Overall AROM Right Upper Extremity: Deficits RUE Strength RUE Overall Strength: Deficits LUE Assessment LUE Assessment: Within Functional Limits RLE Assessment RLE Assessment: Exceptions to [facility] RLE Strength RLE Overall Strength: Deficits RLE Overall Strength Comments: 0/5 throughout LLE Assessment LLE Assessment: Within Functional Limits   See Function Navigator for Current Functional Status.  Carney Living A 10/29/2015, 8:52 AM

## 2015-10-29 NOTE — Discharge Instructions (Signed)
Inpatient Rehab Discharge Instructions  [facility] and [facility]:    Activities/Precautions/ Functional Status: Activity: no lifting, driving, or strenuous exercise for till cleared by MD.  Diet: cardiac diet Wound Care: none needed Functional status:  ___ No restrictions     ___ Walk up steps independently _X__ 24/7 supervision/assistance   ___ Walk up steps with assistance ___ Intermittent supervision/assistance  ___ Bathe/dress independently ___ Walk with walker     _X__ Bathe/dress with assistance ___ Walk Independently    ___ Shower independently ___ Walk with assistance    ___ Shower with assistance _X__ No alcohol     ___ Return to work/school ________    COMMUNITY REFERRALS UPON DISCHARGE:    Home Health:   PT     OT     ST                     Agency:  [facility] Phone: [phone]   Medical Equipment/Items Ordered:  Wheelchair, cushons, hospital bed, drop arm commode                                                       Agency/Supplier:  [facility] @ [phone]   GENERAL COMMUNITY RESOURCES FOR PATIENT/FAMILY: Support Groups: Stroke Support Group (3rd Thursdays of each month on Rehab unit)        Special Instructions: 1. Limit walking with therapy.    My questions have been answered and I understand these instructions. I will adhere to these goals and the provided educational materials after my discharge from the hospital.  Patient/Caregiver Signature _______________________________ Date __________  Clinician Signature _______________________________________ Date __________  Please bring this form and your medication list with you to all your follow-up doctor's appointments.

## 2015-10-29 NOTE — Progress Notes (Signed)
Speech Language Pathology Daily Session Note  Patient Details  Name: Sean Wilkerson MRN: [id number] Date of Birth: 02-09-1946  Today's Date: 10/29/2015 SLP Individual Time: 0800-0830 SLP Individual Time Calculation (min): 30 min  Short Term Goals: Week 3: SLP Short Term Goal 1 (Week 3): Patient will consume current diet with minimal overt s/s of aspiration with Mod Ifor use of small bites/sips via cup.  SLP Short Term Goal 2 (Week 3): Patient will demonstrate efficient mastication with trials of regular textures over 2 consecutive sessions prior to upgrade with supervision verbal cues.  SLP Short Term Goal 3 (Week 3): Patient will utilize speech intelligibility strategies at the phrase level with supervision verbal cues to self-monitor and correct errors.  SLP Short Term Goal 4 (Week 3): Patient will utilize diaphragmatic breathing at the word level with Mod A verbal cues for accuracy and to self-monitor and correct errors.  SLP Short Term Goal 5 (Week 3): Patient will complete 25 repititions of IMST at 9 cm H2O with supervision verbal cues for accuracy and a self-percieved levle of effort rating 7 or less on a 0-10 scale.   Skilled Therapeutic Interventions: Skilled treatment session focused on completion of patient education. SLP facilitated session by providing handouts in regards to patient's current swallowing function, diet recommendations, appropriate textures and swallowing compensatory strategies. SLP also facilitated session by providing Mod A question cues to recall strategies/activities to utilize at home to maximize speech intelligibility. Patient also requested books he could read that focused on stroke and stroke recovery, a list was provided to him. Patient left supine in bed with all needs within reach. Continue with current plan of care.    Function:  Eating Eating   Modified Consistency Diet: Yes Eating Assist Level: Swallowing techniques: self managed            Cognition Comprehension Comprehension assist level: Understands basic 90% of the time/cues < 10% of the time  Expression   Expression assist level: Expresses basic 50 - 74% of the time/requires cueing 25 - 49% of the time. Needs to repeat parts of sentences.  Social Interaction Social Interaction assist level: Interacts appropriately with others - No medications needed.  Problem Solving Problem solving assist level: Solves basic 75 - 89% of the time/requires cueing 10 - 24% of the time  Memory Memory assist level: Recognizes or recalls 75 - 89% of the time/requires cueing 10 - 24% of the time    Pain Pain Assessment Pain Assessment: No/denies pain  Therapy/Group: Individual Therapy  Sean Wilkerson 10/29/2015, 4:24 PM

## 2015-10-29 NOTE — Progress Notes (Signed)
Recreational Therapy Discharge Summary Patient Details  Name: Levii Hairfield MRN: [id number] Date of Birth: 1946-06-01 Today's Date: 10/29/2015  Long term goals set: 1  Long term goals met: 1  Comments on progress toward goals: Pt has made good progress during LOS and is ready for discharge home with family on 10/30/15.  Pt is discharging at overall supervision/set up assist level for simple TR tasks seated w/c level.  Reviewed potential activity modifications for leisure activities.  Pt is anxious for discharge. Reasons for discharge: discharge from hospital   Patient/family agrees with progress made and goals achieved: Yes  Json Koelzer 10/29/2015, 11:17 AM

## 2015-10-29 NOTE — Progress Notes (Signed)
Social Work  Patient ID: Sean BirchwoodLarry Lee Barney, male   DOB: 05-Jun-1946, 70 y.o.   MRN: [id number]   Have reviewed team conference information with pt and wife.  Wife completing family education today and readying for tomorrow's d/c.  DME for delivery this afternoon.  Have discussed home entry as ramp not expected to be in place by d/c.  I have contacted their closest fire station and confirmed that they can assist pt into the home tomorrow.  Will provide wife with contact ph #.  Pt eager for d/c and reports he feels ready.    Novie Maggio, LCSW

## 2015-10-29 NOTE — Progress Notes (Signed)
Occupational Therapy Session Note  Patient Details  Name: Adolphus BirchwoodLarry Lee Lenzo MRN: [id number] Date of Birth: November 24, 1945  Today's Date: 10/29/2015 OT Individual Time: 1000-1100 OT Individual Time Calculation (min): 60 min    Short Term Goals: Week 3:  OT Short Term Goal 1 (Week 3): STG=LTG secondary to ELOS  Skilled Therapeutic Interventions/Progress Updates:    Pt resting in bed upon arrival with wife present.  Pt's wife present to participate in hands-on training with BADLs. Recommended that patient bathe at sink at home until such time as HHOT can assess home arrangement and make recommendations/practice shower transfers to walk-in shower.  Pt and wife verbalized understanding. Pt and wife verbalized intent to purchase a Stedy to assist with transfers at home.  Pt's wife assisted with bed->w/c transfer and sit<>stand at sink during bathing and dressing tasks.  Pt utilized reacher to assist with LB dressing tasks and long handle sponge to assist with bathing feet.  Pt's wife assisted pt appropriately throughout session.  Focus on BADL retraining and family education.  Therapy Documentation Precautions:  Precautions Precautions: Fall Restrictions Weight Bearing Restrictions: No  Pain:  Pt denied pain   See Function Navigator for Current Functional Status.   Therapy/Group: Individual Therapy  Rich BraveLanier, Maryela Tapper Chappell 10/29/2015, 11:25 AM

## 2015-10-29 NOTE — Progress Notes (Signed)
Occupational Therapy Discharge Summary  Patient Details  Name: Sean Wilkerson MRN: [id number] Date of Birth: March 26, 1946  Patient has met 31 of 11 long term goals due to improved activity tolerance, improved balance, postural control, ability to compensate for deficits, improved attention and improved awareness.  Pt made steady progress with BADLs during this admission. Pt continues to require min A for bathing and functional transfers.Pt requires mod A for toileiting tasks and LB dressing tasks.  Pt's is able to use his RUE as a stabilizer with min A.  Pt employs hemi bathing and dressing techniques to complete tasks in conjunction with AE.  Pt's wife has been present for therapy and participated in providing appropriate assistance with BADLs. Patient to discharge at overall Min to [facility] Assist level.  Patient's care partner is independent to provide the necessary physical and cognitive assistance at discharge.      Recommendation:  Patient will benefit from ongoing skilled OT services in home health setting to continue to advance functional skills in the area of BADL.  Equipment: Drop arm BSC and hospital bed  Reasons for discharge: treatment goals met  Patient/family agrees with progress made and goals achieved: Yes  OT Discharge     Vision/Perception  Vision- History Baseline Vision/History: Wears glasses Wears Glasses: At all times Patient Visual Report: No change from baseline Vision- Assessment Vision Assessment?: Yes Eye Alignment: Within Functional Limits Ocular Range of Motion: Within Functional Limits Alignment/Gaze Preference: Within Defined Limits Tracking/Visual Pursuits: Able to track stimulus in all quads without difficulty  Cognition Overall Cognitive Status: Within Functional Limits for tasks assessed Arousal/Alertness: Awake/alert Orientation Level: Oriented X4 Attention: Sustained Sustained Attention: Appears intact Memory: Appears intact Awareness:  Appears intact Problem Solving: Appears intact Behaviors: Impulsive Safety/Judgment: Appears intact Sensation Sensation Light Touch: Impaired Detail Light Touch Impaired Details: Impaired RUE;Impaired RLE Stereognosis: Not tested Hot/Cold: Appears Intact Coordination Gross Motor Movements are Fluid and Coordinated: No Fine Motor Movements are Fluid and Coordinated: No Coordination and Movement Description: RUE/RLE hemiplegia Motor  Motor Motor: Hemiplegia    Trunk/Postural Assessment  Cervical Assessment Cervical Assessment: Within Functional Limits Thoracic Assessment Thoracic Assessment: Within Functional Limits Lumbar Assessment Lumbar Assessment: Within Functional Limits Postural Control Postural Control: Deficits on evaluation Righting Reactions: impaired Protective Responses: impaired  Balance Static Sitting Balance Static Sitting - Balance Support: Feet supported;Left upper extremity supported Static Sitting - Level of Assistance: 6: Modified independent (Device/Increase time) Dynamic Sitting Balance Dynamic Sitting - Balance Support: Feet supported;During functional activity Dynamic Sitting - Level of Assistance: 6: Modified independent (Device/Increase time) Extremity/Trunk Assessment RUE Assessment RUE Assessment: Exceptions to [facility] RUE AROM (degrees) Overall AROM Right Upper Extremity: Deficits RUE Strength RUE Overall Strength: Deficits LUE Assessment LUE Assessment: Within Functional Limits   See Function Navigator for Current Functional Status.  Leotis Shames [facility] 10/29/2015, 3:35 PM

## 2015-10-30 MED ORDER — METHOCARBAMOL 500 MG PO TABS: 500.0000 mg | ORAL_TABLET | Freq: Four times a day (QID) | ORAL | Status: AC | PRN

## 2015-10-30 MED ORDER — POLYETHYLENE GLYCOL 3350 17 G PO PACK: 17.0000 g | PACK | Freq: Every day | ORAL | Status: DC

## 2015-10-30 MED ORDER — NORTRIPTYLINE HCL 10 MG PO CAPS: 10.0000 mg | ORAL_CAPSULE | Freq: Every day | ORAL | Status: DC

## 2015-10-30 MED ORDER — PANTOPRAZOLE SODIUM 40 MG PO TBEC: 40.0000 mg | DELAYED_RELEASE_TABLET | Freq: Every day | ORAL | Status: DC

## 2015-10-30 MED ORDER — CARVEDILOL 25 MG PO TABS: 25.0000 mg | ORAL_TABLET | Freq: Two times a day (BID) | ORAL | Status: AC

## 2015-10-30 MED ORDER — ATORVASTATIN CALCIUM 40 MG PO TABS: 40.0000 mg | ORAL_TABLET | Freq: Every day | ORAL | Status: DC

## 2015-10-30 MED ORDER — GABAPENTIN 400 MG PO CAPS: 800.0000 mg | ORAL_CAPSULE | Freq: Three times a day (TID) | ORAL | Status: DC

## 2015-10-30 MED ORDER — AMLODIPINE BESYLATE 10 MG PO TABS: 10.0000 mg | ORAL_TABLET | Freq: Every day | ORAL | Status: AC

## 2015-10-30 NOTE — Progress Notes (Signed)
Social Work  Discharge Note  The overall goal for the admission was met for:   Discharge location: Yes - home with wife as primary caregiver  Length of Stay: Yes - 22 days  Discharge activity level: Yes - min/mod assist w/c level  Home/community participation: Yes  Services provided included: MD, RD, PT, OT, SLP, RN, TR, Pharmacy, [place]: Medicare  [facility] Medicare)  Follow-up services arranged: Home Health: PT, OT, ST via [facility], DME: 20x18 lightweight w/c with 1/2 lap tray, Jay 2 seat and back cushion, hospital bed, drop arm commode via [place] and Patient/Family has no preference for HH/DME agencies  Comments (or additional information):  Patient/Family verbalized understanding of follow-up arrangements: Yes  Individual responsible for coordination of the follow-up plan: pt  Confirmed correct DME delivered: Sean Wilkerson 10/30/2015    Sean Wilkerson

## 2015-10-30 NOTE — Progress Notes (Signed)
[place] PHYSICAL MEDICINE & REHABILITATION     PROGRESS NOTE  Subjective/Complaints:  PLEASED TO BE GOING HOME. GRATEFUL TO TEAM FOR THEIR EFFORTS.  ROS:   Denies CP, SOB, N/V/D, anxiety, depression, no visual issues   Objective: Vital Signs: Blood pressure 135/67, pulse 67, temperature 98.2 F (36.8 C), temperature source Oral, resp. rate 19, weight 111.721 kg (246 lb 4.8 oz), SpO2 100 %. No results found. No results for input(s): WBC, HGB, HCT, PLT in the last 72 hours. No results for input(s): NA, K, CL, GLUCOSE, BUN, CREATININE, CALCIUM in the last 72 hours.  Invalid input(s): CO CBG (last 3)  No results for input(s): GLUCAP in the last 72 hours.  Wt Readings from Last 3 Encounters:  10/22/15 111.721 kg (246 lb 4.8 oz)  10/05/15 115.5 kg (254 lb 10.1 oz)    Physical Exam:  BP 135/67 mmHg  Pulse 67  Temp(Src) 98.2 F (36.8 C) (Oral)  Resp 19  Wt 111.721 kg (246 lb 4.8 oz)  SpO2 100% Constitutional: He appears well-developed and well-nourished. NAD. Vital signs reviewed HENT: Normocephalic and atraumatic.  Mouth/Throat: Oropharynx is pink, clear and moist.  Eyes: Conjunctivae and EOM are normal. Right eye exhibits no discharge. Left eye exhibits no discharge.  Cardiovascular: Normal rate and regular rhythm.No murmur heard. Respiratory: Effort normal and breath sounds normal. No respiratory distress. He has no wheezes.  GI: Soft. Bowel sounds are normal. He exhibits no distension. There is no tenderness.  Musculoskeletal: He exhibits no edema. He exhibits no tenderness. 1+ edema RLE, 1/4" sublux right shoulder Neurological: He is alert and oriented.  Right hemifacial weakness.  Mild dysphonia and persistent moderate dysarthria make him difficult to understand unless he slows down.  Has good awareness and insight into deficits.  LUE/LLE: 5/5 proximal to distal RUE: 0/5 prox to distal. RLE: 1/5 HE and KE, distally 0/5.  RUE: 0/5 prox to distal. RLE: 1/5 HE and  KE, trace APF Sensation diminished to light touch and right upper and right lower  Extremities (stable). Skin: Skin is warm and dry.  Psychiatric: He has a normal mood and affect. His behavior is normal. Judgment and thought content normal.   Assessment/Plan: 1. 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 Physiatrist is providing close team supervision and 24 hour management of active medical problems listed below. Physiatrist and rehab team continue to assess barriers to discharge/monitor patient progress toward functional and medical goals.  Function:  Bathing Bathing position   Position: Shower  Bathing parts Body parts bathed by patient: Right arm, Left arm, Chest, Abdomen, Front perineal area, Right upper leg, Left upper leg, Right lower leg, Left lower leg Body parts bathed by helper: Buttocks, Back  Bathing assist Assist Level: Touching or steadying assistance(Pt > 75%)      Upper Body Dressing/Undressing Upper body dressing   What is the patient wearing?: Pull over shirt/dress     Pull over shirt/dress - Perfomed by patient: Thread/unthread left sleeve, Put head through opening, Pull shirt over trunk, Thread/unthread right sleeve Pull over shirt/dress - Perfomed by helper: Thread/unthread right sleeve        Upper body assist Assist Level: Supervision or verbal cues      Lower Body Dressing/Undressing Lower body dressing   What is the patient wearing?: Underwear, Pants, Socks, Shoes Underwear - Performed by patient: Thread/unthread  right underwear leg, Thread/unthread left underwear leg Underwear - Performed by helper: Pull underwear up/down Pants- Performed by patient: Thread/unthread right pants leg, Thread/unthread left pants leg Pants- Performed by helper: Pull pants  up/down   Non-skid slipper socks- Performed by helper: Don/doff right sock, Don/doff left sock Socks - Performed by patient: Don/doff left sock Socks - Performed by helper: Don/doff right sock Shoes - Performed by patient: Don/doff left shoe Shoes - Performed by helper: Don/doff right shoe          Lower body assist Assist for lower body dressing: 2 Designer, multimedia activity did not occur: No continent bowel/bladder event Toileting steps completed by patient: Adjust clothing prior to toileting, Performs perineal hygiene Toileting steps completed by helper: Adjust clothing after toileting Toileting Assistive Devices: Grab bar or rail  Toileting assist Assist level: Two helpers   Transfers Chair/bed transfer   Chair/bed transfer method: Squat pivot Chair/bed transfer assist level: Touching or steadying assistance (Pt > 75%) Chair/bed transfer assistive device: Armrests Mechanical lift: Landscape architect     Max distance: 20 Assist level: Moderate assist (Pt 50 - 74%)   Wheelchair   Type: Manual Max wheelchair distance: 150 Assist Level: Supervision or verbal cues  Cognition Comprehension Comprehension assist level: Understands basic 90% of the time/cues < 10% of the time  Expression Expression assist level: Expresses basic 50 - 74% of the time/requires cueing 25 - 49% of the time. Needs to repeat parts of sentences.  Social Interaction Social Interaction assist level: Interacts appropriately with others - No medications needed.  Problem Solving Problem solving assist level: Solves basic 75 - 89% of the time/requires cueing 10 - 24% of the time  Memory Memory assist level: Recognizes or recalls 75 - 89% of the time/requires cueing 10 - 24% of the time   Medical Problem List and Plan: 1. Hemiplegia, dysarthria, dysphagia secondary to left corona radiata, external capsule, and posterior left lentiform nuclei infarct  - dc home. hh  follow up.   -i'll see him back in about one month.  2. DVT Prophylaxis/Anticoagulation: Mechanical: Sequential compression devices, below knee Bilateral lower extremities  -dopplers negative  bilateral LE 3. Pain Management: Tylenol prn.  4. Mood: LCSW to follow for evaluation and support.  5. Neuropsych: This patient is capable of making decisions on hid own behalf. 6. Skin/Wound Care: Routine pressure relief measures.  7. Fluids/Electrolytes/Nutrition: Monitor I/O.   -encouraging PO 8. HTN: .   -Norvasc increased to 10mg  on 12/24  -Coreg increased to 25mg  on 1/2  -continue these meds at home 9. Dyslipidemia: Cotinue Lipitor daily.  10. Neuropathic pain:    -Gabapentin--900mg  tid. ----decreased to 800mg ---continue at home  -continue low dose pamelor at [facility] which has helped pain   11. Constipation  -Improved  LOS (Days) 22 A FACE TO FACE EVALUATION WAS PERFORMED  Harkirat Orozco T 10/30/2015 8:59 AM

## 2015-10-30 NOTE — Progress Notes (Signed)
Speech Language Pathology Discharge Summary  Patient Details  Name: Sean Wilkerson MRN: [id number] Date of Birth: 1946-05-03  Patient has met 2 of 2 long term goals.  Patient to discharge at overall Supervision level.   Reasons goals not met: Patient continues to require supervision verbal cues for use of speech intelligibility strategies    Clinical Impression/Discharge Summary: Patient has made functional gains and has met 2 of 4 LTG's this admission due to improved swallowing function. Currently, patient is consuming Dys. 3 textures with thin liquids without overt s/s of aspiration and is Mod I for use of swallowing compensatory strategies.  Patient is currently ~90% intelligible at the sentence level and requires supervision-Min A verbal cues for use of speech compensatory strategies and to self-monitor and correct speech errors.  Patient and family education is complete and patient will discharge home with 24 hour supervision from family. Patient would benefit from f/u SLP services to maximize speech intelligibility and swallowing function in order to maximize patient's functional independence and reduce caregiver burden.   Care Partner:  Caregiver Able to Provide Assistance: Yes     Recommendation:  Home Health SLP;24 hour supervision/assistance  Rationale for SLP Follow Up: Maximize swallowing safety;Maximize functional communication;Reduce caregiver burden   Equipment: N/A    Reasons for discharge: Discharged from hospital   Patient/Family Agrees with Progress Made and Goals Achieved: Yes   [place] 10/30/2015, 7:18 AM

## 2015-10-30 NOTE — Progress Notes (Signed)
Patient A/O, no noted distress. Patient slept through the nightPatient tolerated meds. Patient requested sleep regimen, which was effective. Staff will continue to monitor and meet needt needs

## 2015-10-30 NOTE — Progress Notes (Signed)
Patient and patient's wife given discharge instructions by Malissa Hippoan A., PA, and all questions answered. All belongings packed by family, and taken with family. Patient assisted by nurse tech via wheelchair to car.[place]

## 2015-11-04 ENCOUNTER — Telehealth: Payer: Self-pay | Admitting: *Deleted

## 2015-11-04 NOTE — Telephone Encounter (Signed)
Sean Wilkerson calling for continuation of OT therapy.  Verbal order given.

## 2015-11-11 NOTE — Discharge Summary (Signed)
Physician Discharge Summary  Patient ID: Sean Wilkerson MRN: [id number] DOB/AGE: Aug 09, 1946 70 y.o.  Admit date: 10/08/2015 Discharge date: 10/30/2015  Discharge Diagnoses:  Principal Problem:   Stroke, acute, thrombotic (HCC) Active Problems:   Right hemiplegia (HCC)   Hemiparesis, aphasia, and dysphagia as late effect of cerebrovascular accident (CVA) (HCC)   Essential hypertension   HLD (hyperlipidemia)   Hemiplegia and hemiparesis following cerebral infarction affecting right dominant side (HCC)   Neuropathic pain   Slow transit constipation   Discharged Condition: stable.   Significant Diagnostic Studies: No results found.  Labs:  Basic Metabolic Panel:    Component Value Date/Time   NA 140 10/21/2015 0438   K 4.5 10/21/2015 0438   CL 107 10/21/2015 0438   CO2 27 10/21/2015 0438   GLUCOSE 129* 10/21/2015 0438   BUN 11 10/21/2015 0438   CREATININE 0.96 10/21/2015 0438   CALCIUM 9.1 10/21/2015 0438   GFRNONAA >60 10/21/2015 0438   GFRAA >60 10/21/2015 0438     CBC: CBC Latest Ref Rng 10/21/2015 10/09/2015 10/05/2015  WBC 4.0 - 10.5 K/uL 4.9 6.5 -  Hemoglobin 13.0 - 17.0 g/dL 12.5(L) 12.9(L) 13.9  Hematocrit 39.0 - 52.0 % 35.2(L) 39.3 41.0  Platelets 150 - 400 K/uL 188 235 -     CBG: No results for input(s): GLUCAP in the last 168 hours.  Brief HPI:   Sean Wilkerson is a 70 y.o. left handed male with history of HTN, recent syncopal episode (being evaluated with cardiac monitor) who was admitted on 10/05/15 with right sided weakness and slurred speech. CT head negative and he was treated with tPA for NIHSS 7. 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 Dr. Pearlean Brownie felt that patient had left brain subcortical stroke due to small vessel disease and to  continue ASA for secondary stroke prevention.  Patient with resultant right hemiparesis, moderate to severe dysarthria and dysphagia. Therapy evaluations done and CIR was recommended for follow up therapy.   Hospital Course: Sean Wilkerson was admitted to rehab 10/08/2015 for inpatient therapies to consist of PT, ST and OT at least three hours five days a week. Past admission physiatrist, therapy team and rehab RN have worked together to provide customized collaborative inpatient rehab. Blood pressures were monitored on bid basis and continued to be labile.  Norvasc and coreg were increased to help with better control. Po intake has been good and he is continent of bowel and bladder. Check of lytes shows shows renal status to be stable.  He has had complaints of neuropathic pain RUE and gabapentin was added to help with symptoms and titrated to 800 mg tid. As he was unable to tolerate higher doses of Neurontin, low dose Pamelor was added to help with symptom management. This has been effective in managing neuropathy as well as helping with insomnia.  BLE dopplers were checked on 01/05 due to complaints of RLE pain with edema. This was negative for DVT and pain in RLE has resolved. He has progressed to min to moderate assist level for all mobility as well as self care tasks. He will continue to receive follow up HHPT, HHOT and HHST by [facility] after discharge.    Rehab course: During patient's stay in rehab weekly team conferences were held to monitor patient's progress,  set goals and discuss barriers to discharge. At admission, patient required total assist with mobility and max assist with ADL tasks. He had moderate dysarthria with low vocal quality and decreased breath support. He required min cues for mild left pocketing and to use safe swallow strategies. He has had improvement in activity tolerance, balance, postural control, as well as ability to compensate for deficits. He is has had  improvement in functional use RUE  and RLE as well as improved awareness.  He is tolerating dysphagia 3, thin liquids without difficulty and is able to utilize compensatory swallow strategies independently.  Speech is 90% intelligible and he requires supervision to min verbal cues to use speech strategies and self monitor for errors. He is able to use hemi body techniques with AE to complete ADL tasks. He requires min assist for bathing and moderate assistance with toileting and lower body dressing.  He requires min assist for transfers and is able to ambulate 20' with left hemi-walker. Family education was done with wife regarding safety as well as all aspects of care.   Disposition: 06-Home-Health Care Svc  Diet: Cardiac diet.   Special Instructions: 1. Limit walking with therapy.  2. Needs 24 hours supervision/assistance.       Medication List    TAKE these medications        amLODipine 10 MG tablet  Commonly known as:  NORVASC  Take 1 tablet (10 mg total) by mouth daily.     aspirin 325 MG tablet  Take 1 tablet (325 mg total) by mouth daily.     atorvastatin 40 MG tablet  Commonly known as:  LIPITOR  Take 1 tablet (40 mg total) by mouth daily at 6 PM.     carvedilol 25 MG tablet  Commonly known as:  COREG  Take 1 tablet (25 mg total) by mouth 2 (two) times daily with a meal.     gabapentin 400 MG capsule  Commonly known as:  NEURONTIN  Take 2 capsules (800 mg total) by mouth 3 (three) times daily.     methocarbamol 500 MG tablet  Commonly known as:  ROBAXIN  Take 1 tablet (500 mg total) by mouth every 6 (six) hours as needed for muscle spasms.     nortriptyline 10 MG capsule  Commonly known as:  PAMELOR  Take 1 capsule (10 mg total) by mouth at bedtime.     pantoprazole 40 MG tablet  Commonly known as:  PROTONIX  Take 1 tablet (40 mg total) by mouth at bedtime.     polyethylene glycol packet  Commonly known as:  MIRALAX / GLYCOLAX  Take 17 g by mouth daily.            Follow-up Information    Follow up with Ranelle Oyster, MD.   Specialty:  Physical Medicine and Rehabilitation   Contact information:   [address], [address] [place] [address] [phone]       Follow up with Delia Heady, MD. Call today.   Specialties:  Neurology, Radiology   Why:  for follow up appointment in 4 weeks.    Contact information:   [address] [place] [address] [phone]       Follow up with LONG,SCOTT, PA-C On 10/31/2015.   Specialty:  Physician Assistant   Why:  @ 10:00 am (for hospital follow up appt.)   Contact information:   [address] [place] [address] [phone]       Signed:  Jacquelynn Cree 11/11/2015, 2:33 PM

## 2015-11-18 ENCOUNTER — Encounter (HOSPITAL_COMMUNITY): Payer: Self-pay | Admitting: Emergency Medicine

## 2015-11-18 ENCOUNTER — Emergency Department (HOSPITAL_COMMUNITY)
Admission: EM | Admit: 2015-11-18 | Discharge: 2015-11-19 | Disposition: A | Discharge status: Home / Self Care | Payer: Medicare Other | Attending: Emergency Medicine | Admitting: Emergency Medicine

## 2015-11-18 DIAGNOSIS — R197 Diarrhea, unspecified: Secondary | ICD-10-CM | POA: Insufficient documentation

## 2015-11-18 DIAGNOSIS — R42 Dizziness and giddiness: Secondary | ICD-10-CM | POA: Diagnosis not present

## 2015-11-18 DIAGNOSIS — R55 Syncope and collapse: Secondary | ICD-10-CM | POA: Insufficient documentation

## 2015-11-18 DIAGNOSIS — R61 Generalized hyperhidrosis: Secondary | ICD-10-CM | POA: Diagnosis not present

## 2015-11-18 DIAGNOSIS — Z79899 Other long term (current) drug therapy: Secondary | ICD-10-CM | POA: Insufficient documentation

## 2015-11-18 DIAGNOSIS — Z7982 Long term (current) use of aspirin: Secondary | ICD-10-CM | POA: Diagnosis not present

## 2015-11-18 DIAGNOSIS — Z8673 Personal history of transient ischemic attack (TIA), and cerebral infarction without residual deficits: Secondary | ICD-10-CM | POA: Insufficient documentation

## 2015-11-18 DIAGNOSIS — E785 Hyperlipidemia, unspecified: Secondary | ICD-10-CM | POA: Diagnosis not present

## 2015-11-18 DIAGNOSIS — I10 Essential (primary) hypertension: Secondary | ICD-10-CM | POA: Insufficient documentation

## 2015-11-18 HISTORY — DX: Cerebral infarction, unspecified: I63.9

## 2015-11-18 LAB — BASIC METABOLIC PANEL
ANION GAP: 10 (ref 5–15)
BUN: 13 mg/dL (ref 6–20)
CALCIUM: 9.3 mg/dL (ref 8.9–10.3)
CHLORIDE: 107 mmol/L (ref 101–111)
CO2: 24 mmol/L (ref 22–32)
Creatinine, Ser: 0.86 mg/dL (ref 0.61–1.24)
GFR calc Af Amer: >60 mL/min (ref >60)
GFR calc non Af Amer: >60 mL/min (ref >60)
GLUCOSE: 184 mg/dL — AB (ref 65–99)
Potassium: 4.1 mmol/L (ref 3.5–5.1)
Sodium: 141 mmol/L (ref 135–145)

## 2015-11-18 LAB — I-STAT TROPONIN, ED: Troponin i, poc: 0 ng/mL (ref 0.00–0.08)

## 2015-11-18 LAB — CBC
HCT: 39.3 % (ref 39.0–52.0)
HEMOGLOBIN: 12.9 g/dL — AB (ref 13.0–17.0)
MCH: 32.2 pg (ref 26.0–34.0)
MCHC: 32.8 g/dL (ref 30.0–36.0)
MCV: 98 fL (ref 78.0–100.0)
Platelets: 226 10*3/uL (ref 150–400)
RBC: 4.01 MIL/uL — ABNORMAL LOW (ref 4.22–5.81)
RDW: 12 % (ref 11.5–15.5)
WBC: 5.7 10*3/uL (ref 4.0–10.5)

## 2015-11-18 LAB — CBG MONITORING, ED: GLUCOSE-CAPILLARY: 201 mg/dL — AB (ref 65–99)

## 2015-11-18 NOTE — ED Notes (Signed)
Pt arrives by [facility] with c/o near syncopal episode after having a BM today and on Saturday. Pt had a CVA in December 2016. Family insisted he come get checked out. Last vitals 120/66, HR 66, NSR, O2 96% RA, CBG 234.

## 2015-11-19 LAB — I-STAT TROPONIN, ED: Troponin i, poc: 0.01 ng/mL (ref 0.00–0.08)

## 2015-11-19 MED ORDER — DIPHENOXYLATE-ATROPINE 2.5-0.025 MG PO TABS: 1.0000 | ORAL_TABLET | Freq: Four times a day (QID) | ORAL | Status: DC | PRN

## 2015-11-19 NOTE — ED Provider Notes (Signed)
CSN: [id number]     Arrival date & time 11/18/15  2142 History   First MD Initiated Contact with Patient 11/18/15 2158     Chief Complaint  Patient presents with  . Near Syncope     HPI  Impression presents for evaluation after an episode of near-syncope. Patient lives at home. He had a stroke in November. After rehabilitation, he was able to return home.  Tonight he was in his bedroom. His son was shaving his head. He very suddenly had some rectal urgency. His wife and son helped him with a lift mechanism to get to the toilet. He had a large loose bowel movement. He felt dizzy and lightheaded. They helped him back into the left and into bed. His symptoms improved. He had some additional diarrhea and became diaphoretic. His symptoms resolved he states he feels normal and fine now. Had no chest pain or shortness of breath. No other symptoms.  Past Medical History  Diagnosis Date  . Hypertension   . HTN (hypertension) 10/08/2015  . HLD (hyperlipidemia) 10/08/2015  . Stroke [facility])    Past Surgical History  Procedure Laterality Date  . No past surgeries     History reviewed. No pertinent family history. Social History  Substance Use Topics  . Smoking status: Never Smoker   . Smokeless tobacco: None  . Alcohol Use: No    Review of Systems  Constitutional: Negative for fever, chills, diaphoresis, appetite change and fatigue.  HENT: Negative for mouth sores, sore throat and trouble swallowing.   Eyes: Negative for visual disturbance.  Respiratory: Negative for cough, chest tightness, shortness of breath and wheezing.   Cardiovascular: Negative for chest pain.  Gastrointestinal: Positive for diarrhea. Negative for nausea, vomiting, abdominal pain and abdominal distention.  Endocrine: Negative for polydipsia, polyphagia and polyuria.  Genitourinary: Negative for dysuria, frequency and hematuria.  Musculoskeletal: Negative for gait problem.  Skin: Negative for color change, pallor and  rash.  Neurological: Positive for syncope. Negative for dizziness, light-headedness and headaches.  Hematological: Does not bruise/bleed easily.  Psychiatric/Behavioral: Negative for behavioral problems and confusion.      Allergies  Review of patient's allergies indicates no known allergies.  Home Medications   Prior to Admission medications   Medication Sig Start Date End Date Taking? Authorizing Provider  amLODipine (NORVASC) 10 MG tablet Take 1 tablet (10 mg total) by mouth daily. 10/30/15  Yes Daniel J Angiulli, PA-C  aspirin 81 MG tablet Take 81 mg by mouth daily.   Yes Historical Provider, MD  atorvastatin (LIPITOR) 40 MG tablet Take 1 tablet (40 mg total) by mouth daily at 6 PM. 10/30/15  Yes Daniel J Angiulli, PA-C  carvedilol (COREG) 25 MG tablet Take 1 tablet (25 mg total) by mouth 2 (two) times daily with a meal. 10/30/15  Yes Daniel J Angiulli, PA-C  diphenhydramine-acetaminophen (TYLENOL PM) 25-500 MG TABS tablet Take 1 tablet by mouth at bedtime.   Yes Historical Provider, MD  gabapentin (NEURONTIN) 400 MG capsule Take 2 capsules (800 mg total) by mouth 3 (three) times daily. 10/30/15  Yes Daniel J Angiulli, PA-C  methocarbamol (ROBAXIN) 500 MG tablet Take 1 tablet (500 mg total) by mouth every 6 (six) hours as needed for muscle spasms. 10/30/15  Yes Daniel J Angiulli, PA-C  Multiple Vitamin (MULTIVITAMIN WITH MINERALS) TABS tablet Take 1 tablet by mouth daily.   Yes Historical Provider, MD  aspirin 325 MG tablet Take 1 tablet (325 mg total) by mouth daily. Patient not taking: Reported  on 11/18/2015 10/08/15   Micki Riley, MD  nortriptyline (PAMELOR) 10 MG capsule Take 1 capsule (10 mg total) by mouth at bedtime. Patient not taking: Reported on 11/18/2015 10/30/15   Mcarthur Rossetti Angiulli, PA-C  pantoprazole (PROTONIX) 40 MG tablet Take 1 tablet (40 mg total) by mouth at bedtime. Patient not taking: Reported on 11/18/2015 10/30/15   Mcarthur Rossetti Angiulli, PA-C  polyethylene glycol  (MIRALAX / GLYCOLAX) packet Take 17 g by mouth daily. Patient not taking: Reported on 11/18/2015 10/30/15   Mcarthur Rossetti Angiulli, PA-C   BP 140/69 mmHg  Pulse 66  Temp(Src) 98.2 F (36.8 C) (Oral)  Resp 18  SpO2 97% Physical Exam  Constitutional: He is oriented to person, place, and time. He appears well-developed and well-nourished. No distress.  HENT:  Head: Normocephalic.  Eyes: Conjunctivae are normal. Pupils are equal, round, and reactive to light. No scleral icterus.  Neck: Normal range of motion. Neck supple. No thyromegaly present.  Cardiovascular: Normal rate and regular rhythm.  Exam reveals no gallop and no friction rub.   No murmur heard. Pulmonary/Chest: Effort normal and breath sounds normal. No respiratory distress. He has no wheezes. He has no rales.  Abdominal: Soft. Bowel sounds are normal. He exhibits no distension. There is no tenderness. There is no rebound.  Benign abdomen.  Musculoskeletal: Normal range of motion.  Neurological: He is alert and oriented to person, place, and time.  Mupirocin is.  Skin: Skin is warm and dry. No rash noted.  Psychiatric: He has a normal mood and affect. His behavior is normal.    ED Course  Procedures (including critical care time) Labs Review Labs Reviewed  BASIC METABOLIC PANEL - Abnormal; Notable for the following:    Glucose, Bld 184 (*)    All other components within normal limits  CBC - Abnormal; Notable for the following:    RBC 4.01 (*)    Hemoglobin 12.9 (*)    All other components within normal limits  CBG MONITORING, ED - Abnormal; Notable for the following:    Glucose-Capillary 201 (*)    All other components within normal limits  I-STAT TROPOININ, ED  I-STAT TROPOININ, ED    Imaging Review No results found. I have personally reviewed and evaluated these images and lab results as part of my medical decision-making.   EKG Interpretation None      MDM   Final diagnoses:  Micturition syncope     Patient was rather adamant to return home. I did do a second troponin only slightly more that 1 hour after his first. No elevations. I have very low suspicion of ACS. Symptoms completely consistent with micturition syncope. Plan is home, when necessary Lomotil, push fluids. Recheck with recurrence.    Rolland Porter, MD 11/19/15 [phone]

## 2015-11-19 NOTE — Discharge Instructions (Signed)
Episode of passing out was secondary to the cramping pain with your diarrhea. This is called"Micturition Syncope". If your diarrhea continues you may take Lomotil to help slow down the diarrhea. Be sure to push fluids and stay hydrated if your Diarrhea continues.   Syncope Syncope is a medical term for fainting or passing out. This means you lose consciousness and drop to the ground. People are generally unconscious for less than 5 minutes. You may have some muscle twitches for up to 15 seconds before waking up and returning to normal. Syncope occurs more often in older adults, but it can happen to anyone. While most causes of syncope are not dangerous, syncope can be a sign of a serious medical problem. It is important to seek medical care.  CAUSES  Syncope is caused by a sudden drop in blood flow to the brain. The specific cause is often not determined. Factors that can bring on syncope include:  Taking medicines that lower blood pressure.  Sudden changes in posture, such as standing up quickly.  Taking more medicine than prescribed.  Standing in one place for too long.  Seizure disorders.  Dehydration and excessive exposure to heat.  Low blood sugar (hypoglycemia).  Straining to have a bowel movement.  Heart disease, irregular heartbeat, or other circulatory problems.  Fear, emotional distress, seeing blood, or severe pain. SYMPTOMS  Right before fainting, you may:  Feel dizzy or light-headed.  Feel nauseous.  See all white or all black in your field of vision.  Have cold, clammy skin. DIAGNOSIS  Your health care provider will ask about your symptoms, perform a physical exam, and perform an electrocardiogram (ECG) to record the electrical activity of your heart. Your health care provider may also perform other heart or blood tests to determine the cause of your syncope which may include:  Transthoracic echocardiogram (TTE). During echocardiography, sound waves are used to  evaluate how blood flows through your heart.  Transesophageal echocardiogram (TEE).  Cardiac monitoring. This allows your health care provider to monitor your heart rate and rhythm in real time.  Holter monitor. This is a portable device that records your heartbeat and can help diagnose heart arrhythmias. It allows your health care provider to track your heart activity for several days, if needed.  Stress tests by exercise or by giving medicine that makes the heart beat faster. TREATMENT  In most cases, no treatment is needed. Depending on the cause of your syncope, your health care provider may recommend changing or stopping some of your medicines. HOME CARE INSTRUCTIONS  Have someone stay with you until you feel stable.  Do not drive, use machinery, or play sports until your health care provider says it is okay.  Keep all follow-up appointments as directed by your health care provider.  Lie down right away if you start feeling like you might faint. Breathe deeply and steadily. Wait until all the symptoms have passed.  Drink enough fluids to keep your urine clear or pale yellow.  If you are taking blood pressure or heart medicine, get up slowly and take several minutes to sit and then stand. This can reduce dizziness. SEEK IMMEDIATE MEDICAL CARE IF:   You have a severe headache.  You have unusual pain in the chest, abdomen, or back.  You are bleeding from your mouth or rectum, or you have black or tarry stool.  You have an irregular or very fast heartbeat.  You have pain with breathing.  You have repeated fainting or seizure-like  jerking during an episode.  You faint when sitting or lying down.  You have confusion.  You have trouble walking.  You have severe weakness.  You have vision problems. If you fainted, call your local emergency services (911 in [place] Do not drive yourself to the hospital.    This information is not intended to replace advice given to you by  your health care provider. Make sure you discuss any questions you have with your health care provider.   Document Released: 10/04/2005 Document Revised: 02/18/2015 Document Reviewed: 12/03/2011 Elsevier Interactive Patient Education [employer]

## 2015-11-21 ENCOUNTER — Telehealth: Payer: Self-pay | Admitting: Physical Medicine & Rehabilitation

## 2015-11-24 DIAGNOSIS — G629 Polyneuropathy, unspecified: Secondary | ICD-10-CM

## 2015-11-24 DIAGNOSIS — I10 Essential (primary) hypertension: Secondary | ICD-10-CM

## 2015-11-24 DIAGNOSIS — I69391 Dysphagia following cerebral infarction: Secondary | ICD-10-CM | POA: Diagnosis not present

## 2015-11-24 DIAGNOSIS — I69351 Hemiplegia and hemiparesis following cerebral infarction affecting right dominant side: Secondary | ICD-10-CM | POA: Diagnosis not present

## 2015-11-24 DIAGNOSIS — R1312 Dysphagia, oropharyngeal phase: Secondary | ICD-10-CM | POA: Diagnosis not present

## 2015-11-24 DIAGNOSIS — I69322 Dysarthria following cerebral infarction: Secondary | ICD-10-CM | POA: Diagnosis not present

## 2015-11-26 ENCOUNTER — Ambulatory Visit: Payer: Self-pay | Admitting: Gastroenterology

## 2015-12-29 ENCOUNTER — Telehealth: Payer: Self-pay | Admitting: *Deleted

## 2015-12-29 NOTE — Telephone Encounter (Signed)
Kathie RhodesBetty from [facility] called to get extension on PT orders and recert which will be taking place Wednesday 12/31/15--ext 1wk1 (today) and recert for 1wk1 2 wk 8 preparing Mr Sean Wilkerson for outpt therapy.  The number left [phone]) does not have name identifier so I was unable to leave approval verbal order on VM.  Requested call back to office for orders.

## 2015-12-30 ENCOUNTER — Other Ambulatory Visit: Payer: Self-pay | Admitting: *Deleted

## 2015-12-30 NOTE — Telephone Encounter (Signed)
I spoke with Kathie RhodesBetty and gave the approval.

## 2016-01-14 ENCOUNTER — Encounter: Payer: Self-pay | Admitting: Neurology

## 2016-01-14 ENCOUNTER — Ambulatory Visit (INDEPENDENT_AMBULATORY_CARE_PROVIDER_SITE_OTHER): Payer: Medicare Other | Admitting: Neurology

## 2016-01-14 VITALS — BP 116/65 | HR 63 | Ht 69.0 in

## 2016-01-14 DIAGNOSIS — I69351 Hemiplegia and hemiparesis following cerebral infarction affecting right dominant side: Secondary | ICD-10-CM | POA: Diagnosis not present

## 2016-01-14 DIAGNOSIS — I10 Essential (primary) hypertension: Secondary | ICD-10-CM | POA: Diagnosis not present

## 2016-01-14 DIAGNOSIS — G629 Polyneuropathy, unspecified: Secondary | ICD-10-CM | POA: Diagnosis not present

## 2016-01-14 DIAGNOSIS — E669 Obesity, unspecified: Secondary | ICD-10-CM

## 2016-01-14 DIAGNOSIS — E785 Hyperlipidemia, unspecified: Secondary | ICD-10-CM

## 2016-01-14 DIAGNOSIS — I69322 Dysarthria following cerebral infarction: Secondary | ICD-10-CM | POA: Diagnosis not present

## 2016-01-14 DIAGNOSIS — G8191 Hemiplegia, unspecified affecting right dominant side: Secondary | ICD-10-CM

## 2016-01-14 MED ORDER — ROSUVASTATIN CALCIUM 5 MG PO TABS: 2.5000 mg | ORAL_TABLET | Freq: Every day | ORAL | Status: DC

## 2016-01-14 NOTE — Patient Instructions (Signed)
I had a long d/w patient and his wife about his recent stroke, risk for recurrent stroke/TIAs, personally independently reviewed imaging studies and stroke evaluation results and answered questions.Continue aspirin 81 mg daily  for secondary stroke prevention and maintain strict control of hypertension with blood pressure goal below 130/90, diabetes with hemoglobin A1c goal below 6.5% and lipids with LDL cholesterol goal below 70 mg/dL. I counseled the patient and his wife to be compliant with his cholesterol medication as red yeast oral alone is unlikely to bring his cholesterol to satisfactory level. I recommend he try Crestor 2.5 mg daily and follow-up with his primary physician for further lipid profile and LFTs check. Continue ongoing physical and occupational therapy. I also advised the patient to eat a healthy diet with plenty of whole grains, cereals, fruits and vegetables, exercise regularly and maintain ideal body weight Followup in the future with Butch Penny, nurse practitioner in 3 months or call earlier if necessary Stroke Prevention Some medical conditions and behaviors are associated with an increased chance of having a stroke. You may prevent a stroke by making healthy choices and managing medical conditions. HOW CAN I REDUCE MY RISK OF HAVING A STROKE?   Stay physically active. Get at least 30 minutes of activity on most or all days.  Do not smoke. It may also be helpful to avoid exposure to secondhand smoke.  Limit alcohol use. Moderate alcohol use is considered to be:  No more than 2 drinks per day for men.  No more than 1 drink per day for nonpregnant women.  Eat healthy foods. This involves:  Eating 5 or more servings of fruits and vegetables a day.  Making dietary changes that address high blood pressure (hypertension), high cholesterol, diabetes, or obesity.  Manage your cholesterol levels.  Making food choices that are high in fiber and low in saturated fat, trans  fat, and cholesterol may control cholesterol levels.  Take any prescribed medicines to control cholesterol as directed by your health care provider.  Manage your diabetes.  Controlling your carbohydrate and sugar intake is recommended to manage diabetes.  Take any prescribed medicines to control diabetes as directed by your health care provider.  Control your hypertension.  Making food choices that are low in salt (sodium), saturated fat, trans fat, and cholesterol is recommended to manage hypertension.  Ask your health care provider if you need treatment to lower your blood pressure. Take any prescribed medicines to control hypertension as directed by your health care provider.  If you are 90-83 years of age, have your blood pressure checked every 3-5 years. If you are 33 years of age or older, have your blood pressure checked every year.  Maintain a healthy weight.  Reducing calorie intake and making food choices that are low in sodium, saturated fat, trans fat, and cholesterol are recommended to manage weight.  Stop drug abuse.  Avoid taking birth control pills.  Talk to your health care provider about the risks of taking birth control pills if you are over 26 years old, smoke, get migraines, or have ever had a blood clot.  Get evaluated for sleep disorders (sleep apnea).  Talk to your health care provider about getting a sleep evaluation if you snore a lot or have excessive sleepiness.  Take medicines only as directed by your health care provider.  For some people, aspirin or blood thinners (anticoagulants) are helpful in reducing the risk of forming abnormal blood clots that can lead to stroke. If you have  the irregular heart rhythm of atrial fibrillation, you should be on a blood thinner unless there is a good reason you cannot take them.  Understand all your medicine instructions.  Make sure that other conditions (such as anemia or atherosclerosis) are addressed. SEEK  IMMEDIATE MEDICAL CARE IF:   You have sudden weakness or numbness of the face, arm, or leg, especially on one side of the body.  Your face or eyelid droops to one side.  You have sudden confusion.  You have trouble speaking (aphasia) or understanding.  You have sudden trouble seeing in one or both eyes.  You have sudden trouble walking.  You have dizziness.  You have a loss of balance or coordination.  You have a sudden, severe headache with no known cause.  You have new chest pain or an irregular heartbeat. Any of these symptoms may represent a serious problem that is an emergency. Do not wait to see if the symptoms will go away. Get medical help at once. Call your local emergency services (911 in [place] Do not drive yourself to the hospital.   This information is not intended to replace advice given to you by your health care provider. Make sure you discuss any questions you have with your health care provider.   Document Released: 11/11/2004 Document Revised: 10/25/2014 Document Reviewed: 04/06/2013 Elsevier Interactive Patient Education [employer] Inc[employer]

## 2016-01-15 ENCOUNTER — Other Ambulatory Visit: Payer: Self-pay

## 2016-01-15 NOTE — Patient Outreach (Signed)
[facility] Care Management  01/15/2016  Adolphus BirchwoodLarry Lee Mcgrail [birth year] [id number]   Incoming telephone call from patient.  mRS was successfully completed. mRS = 4   Sherle PoeNicole Blaze Nylund, Conrad BurlingtonB.A.  [facility] Care Management Assistant

## 2016-01-15 NOTE — Progress Notes (Signed)
[facility] [address] [place] [phone]       OFFICE FOLLOW-UP NOTE  Sean Wilkerson Date of Birth:  09-22-1946 Medical Record Number:  [id number]   HPI: [id number] year male seen today for first office follow-up visit following hospital admission for stroke in December 2016. Sean Wilkerson is an 70 y.o. male with a history of hypertension, obesity and syncopal spells, brought to the ED and code stroke status following acute onset of slurred speech along with weakness and numbness involving his right side. Patient was last known well at 7:30 PM  10/05/15. He had no history of stroke nor TIA. He's been taking aspirin 81 mg per day. CT scan of his head showed no acute intracranial abnormality. NIH stroke score was 7. Patient was deemed a candidate for intravenous thrombolytic therapy with TPA, which was administered. Patient was subsequently admitted to neuro intensive care unit for further management. Patient has been experiencing a couple spells of unclear etiology and is currently wearing a long-term cardiac monitor. IV TPA 90 mg Sunday 10/05/2015 at 2045 LSN: 7:30 PM on 10/05/2015 tPA Given: Yes patient was admitted to the intensive care unit where blood pressure was tightly controlled with close neurological monitoring. 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 MRA of the brain showed no large vessel occlusion or stenosis but mild atherosclerotic changes. LDL cholesterol was elevated at 135. Hemoglobin A1c was 6.0. Transthoracic echo showed normal ejection fraction without cardiac source of embolism. Carotid Doppler showed no significant extracranial stenosis. Patient's neurological exam actually worsened within the NIH scale score 7 on admission became-13 at the time of discharge. He was seen by physical occupational  therapy and felt to be a good patient for rehabilitation and he finished her rehabilitation and is currently at home. He is doing home physical and occupation therapy. Is able to walk with a walker with a foot brace. He can feed himself and needs some help with transfers and with toilet. Patient states he did not tolerate Lipitor due to fear of side effects without actually having them and stopped it after a month. He is tolerating aspirin well without bruising or bleeding. Continues to have dense right hemiplegia with no movements practically. ROS:   14 system review of systems is positive for weakness, gait difficulty, arm pain and all other systems negative  PMH:  Past Medical History  Diagnosis Date  . Hypertension   . HTN (hypertension) 10/08/2015  . HLD (hyperlipidemia) 10/08/2015  . Stroke [facility](HCC)     Social History:  Social History   Social History  . Marital Status: Single    Spouse Name: N/A  . Number of Children: N/A  . Years of Education: N/A   Occupational History  . Not on file.   Social History Main Topics  . Smoking status: Never Smoker   . Smokeless tobacco: Not on file  . Alcohol Use: No  . Drug Use: No  . Sexual Activity: Not on file   Other Topics Concern  . Not on file   Social History Narrative    Medications:   Current Outpatient Prescriptions on File Prior to Visit  Medication Sig Dispense Refill  . amLODipine (NORVASC) 10 MG tablet Take 1 tablet (10 mg total) by mouth daily. 30 tablet 1  . aspirin 81 MG tablet Take 81 mg by mouth  daily.    . carvedilol (COREG) 25 MG tablet Take 1 tablet (25 mg total) by mouth 2 (two) times daily with a meal. 60 tablet 1  . diphenhydramine-acetaminophen (TYLENOL PM) 25-500 MG TABS tablet Take 1 tablet by mouth at bedtime.    . gabapentin (NEURONTIN) 400 MG capsule Take 2 capsules (800 mg total) by mouth 3 (three) times daily. 90 capsule 1  . methocarbamol (ROBAXIN) 500 MG tablet Take 1 tablet (500 mg total) by mouth  every 6 (six) hours as needed for muscle spasms. 90 tablet 0  . Multiple Vitamin (MULTIVITAMIN WITH MINERALS) TABS tablet Take 1 tablet by mouth daily.    . nortriptyline (PAMELOR) 10 MG capsule Take 1 capsule (10 mg total) by mouth at bedtime. 30 capsule 1   No current facility-administered medications on file prior to visit.    Allergies:  No Known Allergies  Physical Exam General: well developed, well nourished Middle-age Caucasian male sitting in a wheelchair, seated, in no evident distress Head: head normocephalic and atraumatic.  Neck: supple with no carotid or supraclavicular bruits Cardiovascular: regular rate and rhythm, no murmurs Musculoskeletal: no deformity Skin:  no rash/petichiae Vascular:  Normal pulses all extremities Filed Vitals:   01/14/16 1305  BP: 116/65  Pulse: 63   Neurologic Exam Mental Status: Awake and fully alert. Oriented to place and time. Recent and remote memory intact. Attention span, concentration and fund of knowledge appropriate. Mood and affect appropriate. Mild dysarthria. No aphasia. Cranial Nerves: Fundoscopic exam reveals sharp disc margins. Pupils equal, briskly reactive to light. Extraocular movements full without nystagmus. Visual fields full to confrontation. Hearing intact. Moderate right lower facial weakness., Tongue, palate moves normally and symmetrically.  Motor: Dense right hemiplegia with 0/5 strength. Increased tone in the right leg. Normal strength tone and coordination on the left side. Sensory.: intact to touch ,pinprick .position and vibratory sensation. On the left and slightly reduced on the right.  Coordination: Rapid alternating movements normal in all extremities. Finger-to-nose and heel-to-shin performed accurately bilaterally. Gait and Station: Arises from chair without difficulty. Stance is normal. Gait demonstrates normal stride length and balance . Able to heel, toe and tandem walk without difficulty.  Reflexes: 1+ and  symmetric. Toes downgoing.   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    PLAN: I had a long d/w patient and his wife about his recent stroke, risk for recurrent stroke/TIAs, personally independently reviewed imaging studies and stroke evaluation results and answered questions.Continue aspirin 81 mg daily  for secondary stroke prevention and maintain strict control of hypertension with blood pressure goal below 130/90, diabetes with hemoglobin A1c goal below 6.5% and lipids with LDL cholesterol goal below 70 mg/dL. I counseled the patient and his wife to be compliant with his cholesterol medication as red yeast oral alone is unlikely to bring his cholesterol to satisfactory level. I recommend he try Crestor 2.5 mg daily and follow-up with his primary physician for further lipid profile and LFTs check. Continue ongoing physical and occupational therapy. I also advised the patient to eat a healthy diet with plenty of whole grains, cereals, fruits and vegetables, exercise regularly and maintain ideal body weight. Followup in the future with Butch Penny, nurse practitioner in 3 months or call earlier if necessary Greater than 50% of  time during this 25 minute visit was spent on counseling,explanation of diagnosis, planning of further management, discussion with patient and family and coordination of care Delia Heady, MD Note: This document was prepared with digital dictation and possible smart phrase technology. Any transcriptional errors that result from this process are unintentional

## 2016-01-15 NOTE — Patient Outreach (Signed)
First telephone outreach to patient to obtain mRS. Left message for return call. 01/15/16   Wynona CanesNicole Shruti Arrey, B.A.  [facility] Care Management Assistant

## 2016-02-11 ENCOUNTER — Telehealth: Payer: Self-pay | Admitting: Neurology

## 2016-02-11 NOTE — Telephone Encounter (Signed)
[facility]. [facility] to have physical, occupational and speech therapy. Sean BlaseDebbie says home health will end on May 12 and this would need to be scheduled after that so there would not be a lapse. A returned call is not needed unless there are questions.

## 2016-02-12 ENCOUNTER — Other Ambulatory Visit: Payer: Self-pay

## 2016-02-12 DIAGNOSIS — G8191 Hemiplegia, unspecified affecting right dominant side: Secondary | ICD-10-CM

## 2016-02-12 NOTE — Telephone Encounter (Signed)
Orders put in for outpatient rehab for ST, PT ,and OT to start 03/01/2016. Pt is currently in home health therapy.

## 2016-02-13 ENCOUNTER — Telehealth: Payer: Self-pay

## 2016-02-13 NOTE — Telephone Encounter (Signed)
Cathy[facility]called requesting to extend therapy to 1wk1 and 2wk2. Verbal orders approved.

## 2016-03-04 ENCOUNTER — Ambulatory Visit: Payer: Medicare Other | Attending: Neurology | Admitting: Occupational Therapy

## 2016-03-04 DIAGNOSIS — R2689 Other abnormalities of gait and mobility: Secondary | ICD-10-CM

## 2016-03-04 DIAGNOSIS — R278 Other lack of coordination: Secondary | ICD-10-CM | POA: Diagnosis present

## 2016-03-04 DIAGNOSIS — R471 Dysarthria and anarthria: Secondary | ICD-10-CM | POA: Diagnosis present

## 2016-03-04 DIAGNOSIS — I69353 Hemiplegia and hemiparesis following cerebral infarction affecting right non-dominant side: Secondary | ICD-10-CM

## 2016-03-04 DIAGNOSIS — R2681 Unsteadiness on feet: Secondary | ICD-10-CM

## 2016-03-04 DIAGNOSIS — M6281 Muscle weakness (generalized): Secondary | ICD-10-CM | POA: Diagnosis present

## 2016-03-04 NOTE — Therapy (Signed)
[facility] [address] [place], [place], [address] Phone: [phone]   Fax:  [phone]  Occupational Therapy Evaluation  Patient Details  Name: Sean Wilkerson MRN: [id number] Date of Birth: 1946-01-10 Referring Provider: Dr. Pearlean Brownie  Encounter Date: 03/04/2016      OT End of Session - 03/04/16 1551    Visit Number 1   Number of Visits 17   Date for OT Re-Evaluation 05/02/16   Authorization Type UMR MCR   Authorization - Visit Number 1  needs G-code every 10th visit   Authorization - Number of Visits 10   OT Start Time 1320   OT Stop Time 1400   OT Time Calculation (min) 40 min   Activity Tolerance Patient tolerated treatment well   Behavior During Therapy [facility] for tasks assessed/performed      Past Medical History  Diagnosis Date  . Hypertension   . HTN (hypertension) 10/08/2015  . HLD (hyperlipidemia) 10/08/2015  . Stroke [facility])     Past Surgical History  Procedure Laterality Date  . No past surgeries      There were no vitals filed for this visit.      Subjective Assessment - 03/04/16 1544    Subjective  Pt s/p CVA in Decmber 2016 presents with right flaccid hemiplegia.    Patient is accompained by: Family member   Pertinent History see Epic   Patient Stated Goals to be more indpendent   Currently in Pain? Yes   Pain Score 4    Pain Location Shoulder   Pain Orientation Right   Pain Descriptors / Indicators Aching   Pain Type Acute pain   Pain Onset More than a month ago   Pain Frequency Intermittent   Aggravating Factors  shoulder flexion/ malpositioning   Pain Relieving Factors repositioning   Multiple Pain Sites No           [facility] OT Assessment - 03/04/16 0001    Assessment   Diagnosis CVA   Referring Provider Dr. Pearlean Brownie   Onset Date 10/04/16   Assessment Pt s/p CVA on 10/04/16 presents with right flaccid hemiplegia.   Prior Therapy CIR  d/c home on 10/30/15, then [facility]  until last week   Precautions   Precautions Fall   Precaution Comments sensory impairment RUE   Balance Screen   Has the patient fallen in the past 6 months Yes   How many times? 1  slid down in the shower, walk in shower   Has the patient had a decrease in activity level because of a fear of falling?  Yes   Is the patient reluctant to leave their home because of a fear of falling?  No   Home  Environment   Family/patient expects to be discharged to: Private residence   Living Arrangements Spouse/significant other   Available Help at Discharge --  has stand up lift for use at home   Home Access Ramped entrance   Home Layout Two level  stays on ground floor with bed/ bath at home   [employer]  with seat, and grab bars   Home Equipment Shower seat  standing lift   Lives With Spouse   Prior Function   Level of Independence Independent   Vocation Full time employment   Vocation Requirements [place]   Leisure read , listen to music    ADL   Eating/Feeding Minimal assistance  for cutting food, feed himself   Grooming Minimal assistance  needs assist for dentures   Upper Body Bathing Minimal assistance   Lower Body Bathing Moderate assistance   Upper Body Dressing Moderate assistance   Lower Body Dressing Maximal assistance   Toilet Tranfer Moderate assistance;Maximal assistance  wife uses lift   Tub/Shower Transfer Moderate assistance   Mobility   Mobility Status Needs assist  pt has standing lift at home and a platform walker   Written Expression   Dominant Hand Left   Vision - History   Patient Visual Report --  denies changes   Vision Assessment   Vision Assessment Vision not tested   Cognition   Attention Selective   Memory --  not tested   Sensation   Light Touch Impaired by gross assessment   Hot/Cold Impaired by gross assessment   Coordination   Gross Motor Movements are Fluid and Coordinated No   Fine Motor Movements are Fluid and  Coordinated No   Other no active movement present in RUE   Edema   Edema moderate edema present in right hand and wrist, pt has a 2 digits witdth subluxation at right shoulder   ROM / Strength   AROM / PROM / Strength PROM   PROM   Overall PROM  Deficits;Due to pain   Overall PROM Comments R shoulder flexion/ abduction limited to 80* P/ROM, remaining P/ROM is grossly WFLS                           OT Short Term Goals - 03/04/16 1600    OT SHORT TERM GOAL #1   Title I with inital HEP.- check 04/03/16   Time 4   Period Weeks   Status New   OT SHORT TERM GOAL #2   Title Pt will demonstrate understanding of RUE positioning to minimize pain and risk for injury including splint wear PRN.   Time 4   Period Weeks   Status New   OT SHORT TERM GOAL #3   Title Pt will consistently donn shirt with min A.   Baseline min-mod A   Time 4   Period Weeks   Status New   OT SHORT TERM GOAL #4   Title Pt will perform toilet transfers with min A.   Baseline wife is currently using standing lift, pt has walker with platform   Time 4   Period Weeks   Status New   OT SHORT TERM GOAL #5   Title Pt will donn pants with min A using AE PRN.   Baseline mod for pants, dependent for shoes and socks   Time 4   Period Weeks   Status New   Additional Short Term Goals   Additional Short Term Goals Yes   OT SHORT TERM GOAL #6   Title Pt will  perform shower transfer and bathe with min A.   Time 4   Period Weeks   Status New   OT SHORT TERM GOAL #7   Title Pt will tolerate P/ROM shoulder flexion to 90* with no significant increase in pain.   Time 4   Period Weeks   Status New           OT Long Term Goals - 03/04/16 1613    OT LONG TERM GOAL #1   Title Pt will donn shirt with set up only.- check 05/01/16   Time 8   Period Weeks   Status New   OT LONG TERM GOAL #2   Title  Pt will donn pants with minguard for balance.   Time 8   Period Weeks   Status New   OT LONG TERM  GOAL #3   Title Pt will donn socks and shoes with min A.   Time 8   Period Weeks   Status New   OT LONG TERM GOAL #4   Title Pt will use RUE as a stabilizer for ADLS at least 25 % of the time with pain less than or equal to 3/10.   Time 8   Period Weeks   Status New   OT LONG TERM GOAL #5   Title Pt will demonstrate P/ROM shoulder flexion to 100* with pain no greater than 3/10.   Time 8   Period Weeks   Status New   Long Term Additional Goals   Additional Long Term Goals Yes   OT LONG TERM GOAL #6   Title Pt will demonstrate ability to stand at countertop and retrieve an item from midlevel shelf with LUE, with supervision and no LOB.   Time 8   Period Weeks   Status New               Plan - 03/13/2016 1552    Clinical Impression Statement Pt is a 70 y.o male s/p left corona radiata infarct extending to external capsule and posterior basal ganglia on 12 /18/16 with TPA administed. Pt presents with right non dominant hemiplegia, impaired sensation, right shoulder pain and decreased functional balance and mobility. Pt can benefit from skilled occupational therapy to maximize independence with ADLs/ IADLS.    Rehab Potential Good   OT Frequency 2x / week  plus eval   OT Duration 8 weeks   OT Treatment/Interventions Self-care/ADL training;Therapeutic exercise;Patient/family education;Balance training;Splinting;Neuromuscular education;Ultrasound;Energy conservation;Therapeutic exercises;Therapeutic activities;DME and/or AE instruction;Parrafin;Cryotherapy;Electrical Stimulation;Fluidtherapy;Cognitive remediation/compensation;Visual/perceptual remediation/compensation;Passive range of motion;Contrast Bath;Moist Heat   Plan education regarding strategies to minimize right shoulder pain, inital HEP   Consulted and Agree with Plan of Care Patient;Family member/caregiver   Family Member Consulted wife      Patient will benefit from skilled therapeutic intervention in order to improve  the following deficits and impairments:  Decreased coordination, Decreased range of motion, Increased edema, Decreased safety awareness, Decreased endurance, Decreased activity tolerance, Decreased knowledge of precautions, Impaired tone, Obesity, Pain, Impaired UE functional use, Decreased knowledge of use of DME, Decreased balance, Decreased cognition, Decreased mobility, Decreased strength, Impaired perceived functional ability, Impaired vision/preception  Visit Diagnosis: Hemiplegia and hemiparesis following cerebral infarction affecting right non-dominant side (HCC) - Plan: Ot plan of care cert/re-cert  Other lack of coordination - Plan: Ot plan of care cert/re-cert  Muscle weakness (generalized) - Plan: Ot plan of care cert/re-cert  Unsteadiness on feet - Plan: Ot plan of care cert/re-cert  Other abnormalities of gait and mobility - Plan: Ot plan of care cert/re-cert      G-Codes - Mar 13, 2016 1624    Functional Assessment Tool Used clinical impresssions /ADL status   Functional Limitation Self care   Self Care Current Status (Z6109) At least 40 percent but less than 60 percent impaired, limited or restricted   Self Care Goal Status (U0454) At least 20 percent but less than 40 percent impaired, limited or restricted      Problem List Patient Active Problem List   Diagnosis Date Noted  . Slow transit constipation   . Neuropathic pain   . Stroke, acute, thrombotic (HCC) 10/08/2015  . Hemiparesis, aphasia, and dysphagia as late effect of cerebrovascular accident (CVA) (  HCC) 10/08/2015  . Essential hypertension 10/08/2015  . HLD (hyperlipidemia) 10/08/2015  . Hemiplegia and hemiparesis following cerebral infarction affecting right dominant side (HCC)   . Right hemiplegia (HCC) 10/06/2015  . Stroke (cerebrum) (HCC) 10/05/2015  . CVA (cerebral infarction) 10/05/2015    RINE,KATHRYN 03/04/2016, 4:27 PM Keene BreathKathryn Rine, OTR/L [phone] Phone: [phone] 4:27 PM  03/04/2016 [facility] [address] [place], [place], [address] Phone: [phone]   Fax:  [phone]  Name: Adolphus BirchwoodLarry Lee Haro MRN: [id number] Date of Birth: Jun 05, 1946

## 2016-03-05 ENCOUNTER — Ambulatory Visit: Payer: Medicare Other | Admitting: Physical Therapy

## 2016-03-05 ENCOUNTER — Encounter: Payer: Self-pay | Admitting: Physical Therapy

## 2016-03-05 ENCOUNTER — Ambulatory Visit: Payer: Medicare Other

## 2016-03-05 DIAGNOSIS — R278 Other lack of coordination: Secondary | ICD-10-CM

## 2016-03-05 DIAGNOSIS — R471 Dysarthria and anarthria: Secondary | ICD-10-CM

## 2016-03-05 DIAGNOSIS — I69353 Hemiplegia and hemiparesis following cerebral infarction affecting right non-dominant side: Secondary | ICD-10-CM | POA: Diagnosis not present

## 2016-03-05 DIAGNOSIS — M6281 Muscle weakness (generalized): Secondary | ICD-10-CM

## 2016-03-05 DIAGNOSIS — R2689 Other abnormalities of gait and mobility: Secondary | ICD-10-CM

## 2016-03-05 NOTE — Therapy (Signed)
[facility] [address] [place], [place], [address] Phone: [phone]   Fax:  [phone]  Speech Language Pathology Evaluation  Patient Details  Name: Sean Wilkerson MRN: [id number] Date of Birth: [birth year] Referring Provider: Delia Heady  Encounter Date: 03/05/2016      End of Session - 03/05/16 1215    Visit Number 1   Number of Visits 17   Date for SLP Re-Evaluation 05/14/16   SLP Start Time 1104   SLP Stop Time  1148   SLP Time Calculation (min) 44 min   Activity Tolerance Patient tolerated treatment well      Past Medical History  Diagnosis Date  . Hypertension   . HTN (hypertension) 10/08/2015  . HLD (hyperlipidemia) 10/08/2015  . Stroke [facility])     Past Surgical History  Procedure Laterality Date  . No past surgeries      There were no vitals filed for this visit.      Subjective Assessment - 03/05/16 1107    Subjective Pt with mild-mod hoarse voice. Pt without s/s aspiration PNA.    Currently in Pain? No/denies            SLP Evaluation [facility] - 03/05/16 1107    SLP Visit Information   SLP Received On 03/05/16   Referring Provider Delia Heady   Onset Date 10-05-15   Medical Diagnosis CVA   General Information   HPI Pt with CVA 10-05-15. Pt initially with dysphagia   Prior Functional Status   Cognitive/Linguistic Baseline Within functional limits   Auditory Comprehension   Overall Auditory Comprehension Appears within functional limits for tasks assessed   Verbal Expression   Overall Verbal Expression Appears within functional limits for tasks assessed   Oral Motor/Sensory Function   Overall Oral Motor/Sensory Function Impaired   Labial ROM Reduced right   Labial Strength Reduced  on rt > left   Labial Sensation Within Functional Limits   Labial Coordination Reduced   Lingual ROM Reduced right   Lingual Symmetry Other (Comment)  slight deviation to rt   Lingual  Strength Reduced   Lingual Sensation Reduced  on rt > left   Lingual Coordination Reduced   Facial Symmetry Right droop   Motor Speech   Overall Motor Speech Impaired   Respiration Impaired   Level of Impairment Phrase   Phonation Hoarse;Low vocal intensity   Articulation Impaired   Level of Impairment Phrase   Intelligibility Intelligibility reduced   Conversation 75-100% accurate  85-90% depending on fatigue   Effective Techniques Increased vocal intensity;Slow rate      Pt blew 60cm H2O with labial leakage approx 30% of the time. SLP changed the flange to rubber instead of the circular hard plastic and pt's labial leakage (air) decr'd considerably. Pt was doing 5 sets of 5 reps with 15 second breaks in between. SLP modified pt's reps to 10 reps with 5 second breaks between reps, twice per day.  Will need to modify further as therapy progresses, ultimately to 5 sets of 5 reps with 2-5 seconds of rest in between reps, up totwice per day if pt tolerates. Additionally, SLP had pt perform loud /a/. Speech was more intelligible with pt focusing on producing speech at the same effort level as with loud /a/. However, pt cont with reduced breath support, running out of air during >7 word responses.  Pt had ENT eval in March due to hoarseness. Vocal tension was noted, with evidence of GERD. Prescription  was written for protonix.                 ADULT SLP TREATMENT - 03/16/16 1227    Treatment Provided   Treatment provided Cognitive-Linquistic   Cognitive-Linquistic Treatment   Treatment focused on Dysarthria   Skilled Treatment SLP initiated new/modified respiratory muscle HEP. Pt req'd min-mod cues rarely. Additionally, pt taught pt a labial strengthening exercise and pt initially req'd min cues occasionally fading to rare cues.           SLP Education - 16-Mar-2016 1214    Education provided Yes   Education Details modified EMST exercises, "kissing" exercise   Person(s)  Educated Patient;Spouse   Methods Demonstration;Explanation;Verbal cues   Comprehension Verbalized understanding;Returned demonstration;Verbal cues required;Need further instruction          SLP Short Term Goals - 2016/03/16 1217    SLP SHORT TERM GOAL #1   Title pt will demo 23/25 reps of EMST with proper procedure over 4 sessions   Time 4   Period Weeks   Status New   SLP SHORT TERM GOAL #2   Title pt will demo oral-motor HEP with rare min A    Time 4   Period Weeks   Status New   SLP SHORT TERM GOAL #3   Title pt will demo 70dB average volume in 5 minutes simple conversation over three sessions   Time 4   Period Weeks   Status New          SLP Long Term Goals - Mar 16, 2016 1224    SLP LONG TERM GOAL #1   Title pt will demo 25/25 reps of EMST with appropriate procedure over 4 sessions   Time 8   Period Weeks   Status New   SLP LONG TERM GOAL #2   Title pt will demo oral motor HEP with SBA over three sessions   Time 8   Period Weeks   Status New   SLP LONG TERM GOAL #3   Title pt will produce 10 minutes of simple to mod complex conversation with average 70dB with rare min A over three sessions   Time 8   Period Weeks   Status New          Plan - 03/16/16 1215    Clinical Impression Statement Pt presents with mod dysarthria characterized by reduced breath support for conversational speech as well as mod hoarseness following a CVA in mid December 2016.    Speech Therapy Frequency 2x / week   Duration --  8 weeks   Treatment/Interventions Oral motor exercises;Compensatory strategies;SLP instruction and feedback;Patient/family education;Functional tasks;Cueing hierarchy  HEP for respiratory muscle training   Potential to Achieve Goals Good   Potential Considerations Severity of impairments   SLP Home Exercise Plan provided today   Consulted and Agree with Plan of Care Patient;Family member/caregiver   Family Member Consulted wife      Patient will benefit from  skilled therapeutic intervention in order to improve the following deficits and impairments:   Dysarthria and anarthria      G-Codes - 03-16-2016 1226    Functional Assessment Tool Used noms - 5 (45% impaired)   Functional Limitations Motor speech   Motor Speech Current Status [id number]) At least 40 percent but less than 60 percent impaired, limited or restricted   Motor Speech Goal Status (U0454) At least 20 percent but less than 40 percent impaired, limited or restricted      Problem List Patient Active  Problem List   Diagnosis Date Noted  . Slow transit constipation   . Neuropathic pain   . Stroke, acute, thrombotic (HCC) 10/08/2015  . 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 Essential hypertension 10/08/2015  . HLD (hyperlipidemia) 10/08/2015  . Hemiplegia and hemiparesis following cerebral infarction affecting right dominant side (HCC)   . Right hemiplegia (HCC) 10/06/2015  . Stroke (cerebrum) (HCC) 10/05/2015  . CVA (cerebral infarction) 10/05/2015    [facility] Hospital[facility]INKE,Darrah Dredge ,MS, CCC-SLP  03/05/2016, 12:28 PM  [facility] [address] [place], [place], [address] Phone: [phone]   Fax:  [phone]  Name: Sean Wilkerson MRN: [id number] Date of Birth: [birth year]

## 2016-03-05 NOTE — Patient Instructions (Signed)
Do the "blower" 10 times, with 5 second between. Rest for a minute, then do the same thing again. Do this again once more per day.  Do 15 "kissing" exercises, twice a day. Make the sound come from the end of your lips, not your teeth.

## 2016-03-06 NOTE — Therapy (Signed)
[facility] [address] [place], [place], [address] Phone: [phone]   Fax:  [phone]  Physical Therapy Evaluation  Patient Details  Name: Sean Wilkerson MRN: [id number] Date of Birth: [birth year] Referring Provider: Dr. Delia Wilkerson  Encounter Date: 03/05/2016      PT End of Session - 03/06/16 1903    Visit Number 1  G1-    Number of Visits 17   Date for PT Re-Evaluation 05/04/16   Authorization Type U[employer] Medicare  G code and PN every 10th visit   Authorization Time Period 03-05-16 - 05-04-16   PT Start Time 1148   PT Stop Time 1236   PT Time Calculation (min) 48 min   Equipment Utilized During Treatment Gait belt      Past Medical History  Diagnosis Date  . Hypertension   . HTN (hypertension) 10/08/2015  . HLD (hyperlipidemia) 10/08/2015  . Stroke [facility])     Past Surgical History  Procedure Laterality Date  . No past surgeries      There were no vitals filed for this visit.       Subjective Assessment - 03/06/16 1849    Subjective Wife reports pt uses Steady Lift at home for transfers in bathroom; pt has hoarse, dysarthric voice - presents to PT using manual wheelchair   Patient is accompained by: Family member  wife [place]   Pertinent History L CVA 10-05-15 - hospitalized until Jan - D/C'd home with Home Health PT, OT ST - Home health finished up on 02-25-16   Patient Stated Goals "I want to walk without anything"   Currently in Pain? No/denies            [facility] PT Assessment - 03/06/16 0001    Assessment   Medical Diagnosis L CVA   Referring Provider Dr. Delia Wilkerson   Onset Date/Surgical Date 10/05/15   Prior Therapy --  Inpatient rehab til 10-30-15; home health til PT 02-25-16   Precautions   Precautions Fall   Precaution Comments sensory impairment RUE and RLE   Balance Screen   Has the patient fallen in the past 6 months No   Has the patient had a decrease in activity level  because of a fear of falling?  No   Is the patient reluctant to leave their home because of a fear of falling?  No   Home Environment   Living Environment Private residence   Type of Home House   Home Access Stairs to enter   Entrance Stairs-Number of Steps 6   Entrance Stairs-Rails Right   Home Layout Two level   Home Equipment Hospital bed;Walker - 2 wheels;Wheelchair - manual;Shower seat;Bedside commode  platform: BSC no longer needed   Additional Comments pt is staying in extra room downstairs -sleeping in a hospital  bed   Prior Function   Level of Independence Needs assistance with ADLs;Needs assistance with gait;Needs assistance with transfers   Vocation Full time employment   Vocation Requirements [place]   Leisure read , listen to music    PROM   Overall PROM Comments RLE WFL's for PROM   Strength   Overall Strength Deficits   Overall Strength Comments overall strength in RLE is 2- -2+/5 with 0/5 R ankle musc.   Transfers   Transfers Stand Pivot Transfers   Comments min assist for stand pivot transfers   Ambulation/Gait   Ambulation/Gait Yes   Ambulation/Gait Assistance 3: Mod assist   Ambulation/Gait Assistance Details 1st  gait rep was with use of KAFO on RLE: 2nd rep without use of KAFO   Ambulation Distance (Feet) 50 Feet  66' 2nd rep    Assistive device Right platform walker   Gait Pattern Step-to pattern;Decreased hip/knee flexion - right;Decreased stance time - right;Decreased step length - right;Decreased dorsiflexion - right   Ambulation Surface Level;Indoor   Stairs Yes   Stairs Assistance 3: Mod assist   Stairs Assistance Details (indicate cue type and reason) step by step   Stair Management Technique One rail Left   Number of Stairs 4   Height of Stairs 6   Static Standing Balance   Static Standing - Balance Support Left upper extremity supported   Static Standing - Level of Assistance 4: Min assist  KAFO on RLE   Static Standing - Comment/# of Minutes  approx. 15 secs                             PT Short Term Goals - 03/06/16 1912    PT SHORT TERM GOAL #1   Title Pt will perform basic transfers with S only - wheelchair to/from mat.  (04-05-16)   Baseline min to mod assist with stand pivot   Time 4   Status New   PT SHORT TERM GOAL #2   Title Stand for at least 3" with intermittent UE support at counter without KAFO on RLE with SBA for incr. independence with ADL's.  (04-05-16)   Time 4   Period Weeks   Status New   PT SHORT TERM GOAL #3   Title Amb. 150' with platform RW with CGA on flat, even surface.  (04-05-16)   Baseline 50' with RW   Time 4   Period Weeks   Status New   PT SHORT TERM GOAL #4   Title Perform bed mobility including sit to/from supine with S.  (04-05-16)   Baseline mod to min assist required per wife's report   Time 4   Period Weeks   Status New   PT SHORT TERM GOAL #5   Title Independent in HEP for RLE strengthening.  (04-05-16)   Time 4   Period Weeks   Status New           PT Long Term Goals - 03/06/16 1917    PT LONG TERM GOAL #1   Title Modified independent with basic transfers.  (05-04-16)   Time 8   Period Weeks   Status New   PT LONG TERM GOAL #2   Title Modified independent with bed mobility.  (05-04-16)   Time 8   Period Weeks   Status New   PT LONG TERM GOAL #3   Title Amb. 37' with hemiwalker with S for incr. household amb.  (05-04-16)   Time 8   Period Weeks   Status New   PT LONG TERM GOAL #4   Title Amb. 80' with hemiwalker with CGA for incr. community accessibility.  (05-04-16)   Time 8   Period Weeks   Status New   PT LONG TERM GOAL #5   Title Negotiate steps (4) with min assist with use of hand rail .  (05-04-16)   Time 8   Period Weeks   Status New   Additional Long Term Goals   Additional Long Term Goals Yes   PT LONG TERM GOAL #6   Title Perform Berg balance test when appropriate and establish goal as needed.  (  05-04-16)   Time 8   Period Weeks    Status New               Plan - 03/06/16 1905    Clinical Impression Statement Pt is a [age] year old male s/p L CVA with dense R hemiparesis. Pt was hospitalized 10-05-15 until Jan 2017 and discharged home with Home Health PT, OT and ST.  Pt is using a platform RW for assistance with ambulation but is not walking very much at home per report. Pt presents with dependencies with transfers, bed mobility and gait as well as balance deficits.   Rehab Potential Good   PT Frequency 2x / week   PT Duration 8 weeks   PT Treatment/Interventions ADLs/Self Care Home Management;DME Instruction;Balance training;Therapeutic exercise;Therapeutic activities;Functional mobility training;Stair training;Gait training;Neuromuscular re-education;Patient/family education;Orthotic Fit/Training;Passive range of motion   PT Next Visit Plan begin HEP for RLE strengthening - in supine or seated position; R heel cord stretches (passive as pt is unable to stand safely); gait training with platform RW   PT Home Exercise Plan RLE strengthening   Consulted and Agree with Plan of Care Patient;Family member/caregiver   Family Member Consulted spouse [name]      Patient will benefit from skilled therapeutic intervention in order to improve the following deficits and impairments:  Abnormal gait, Decreased activity tolerance, Decreased balance, Decreased coordination, Decreased endurance, Decreased mobility, Decreased range of motion, Decreased strength, Impaired tone, Impaired UE functional use, Obesity  Visit Diagnosis: Hemiplegia and hemiparesis following cerebral infarction affecting right non-dominant side (HCC) - Plan: PT plan of care cert/re-cert  Other abnormalities of gait and mobility - Plan: PT plan of care cert/re-cert  Other lack of coordination - Plan: PT plan of care cert/re-cert  Muscle weakness (generalized) - Plan: PT plan of care cert/re-cert      G-Codes - 03/05/16 1924    Functional Assessment  Tool Used clinical judgment; pt requires mod to min assist with amb. with R platform RW; min to mod assist with transfers and bed mobility; unable to stand unsupported   Functional Limitation Mobility: Walking and moving around   Mobility: Walking and Moving Around Current Status [phone]([id number]) At least 80 percent but less than 100 percent impaired, limited or restricted   Mobility: Walking and Moving Around Goal Status [phone](G8979) At least 40 percent but less than 60 percent impaired, limited or restricted       Problem List Patient Active Problem List   Diagnosis Date Noted  . Slow transit constipation   . Neuropathic pain   . Stroke, acute, thrombotic (HCC) 10/08/2015  . 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 Essential hypertension 10/08/2015  . HLD (hyperlipidemia) 10/08/2015  . Hemiplegia and hemiparesis following cerebral infarction affecting right dominant side (HCC)   . Right hemiplegia (HCC) 10/06/2015  . Stroke (cerebrum) (HCC) 10/05/2015  . CVA (cerebral infarction) 10/05/2015    Gisele Pack, Donavan BurnetLinda Suzanne, PT  03/06/2016, 7:28 PM  [facility] [address] [place], [place], [address] Phone: [phone]   Fax:  [phone]  Name: Sean Wilkerson MRN: [id number] Date of Birth: 1946/05/09

## 2016-03-08 ENCOUNTER — Ambulatory Visit: Payer: Medicare Other

## 2016-03-08 DIAGNOSIS — R471 Dysarthria and anarthria: Secondary | ICD-10-CM

## 2016-03-08 DIAGNOSIS — I69353 Hemiplegia and hemiparesis following cerebral infarction affecting right non-dominant side: Secondary | ICD-10-CM | POA: Diagnosis not present

## 2016-03-08 NOTE — Therapy (Signed)
[facility] [address] [place], [place], [address] Phone: [phone]   Fax:  [phone]  Speech Language Pathology Treatment  Patient Details  Name: Sean Wilkerson MRN: [id number] Date of Birth: 22-May-1946 Referring Provider: Delia Heady  Encounter Date: 03/08/2016      End of Session - 03/08/16 1301    Visit Number 2   Number of Visits 17   Date for SLP Re-Evaluation 05/14/16   SLP Start Time 1147   SLP Stop Time  1230   SLP Time Calculation (min) 43 min   Activity Tolerance Patient tolerated treatment well      Past Medical History  Diagnosis Date  . Hypertension   . HTN (hypertension) 10/08/2015  . HLD (hyperlipidemia) 10/08/2015  . Stroke [facility])     Past Surgical History  Procedure Laterality Date  . No past surgeries      There were no vitals filed for this visit.      Subjective Assessment - 03/08/16 1155    Subjective Pt with mod hoarse voice, but VERY intermittent WNL voicing.   Patient is accompained by: --  wife left to run errands   Currently in Pain? No/denies               ADULT SLP TREATMENT - 03/08/16 1158    General Information   Behavior/Cognition Alert;Cooperative;Pleasant mood   Treatment Provided   Treatment provided Cognitive-Linquistic   Cognitive-Linquistic Treatment   Treatment focused on Dysarthria   Skilled Treatment Loud /a/ completed with intermittent WNL voicing between reps, average 74dB with wide range of dB due to intermittent voice quality. Simple answers to questions (personal, biographical) with average 67dB when pt told to exert same effort as with loud /a/. Respiratory training: pt completed 25 reps with SLP A for buccal support. Routinely, first 3-4 reps were stronger and had "bike pump" sound with last 1-2 without the sound, and more pronounced buccal weakness. SLP modified pt's HEP for respiratory training due to this performance.   Assessment / Recommendations / Plan   Plan Continue with current plan of care   Progression Toward Goals   Progression toward goals Progressing toward goals          SLP Education - 03/08/16 1233    Education provided Yes   Education Details modified EMST exercises, "kissing" exercise   Person(s) Educated Patient   Methods Explanation;Demonstration;Verbal cues;Tactile cues;Handout   Comprehension Verbalized understanding;Returned demonstration;Verbal cues required;Need further instruction          SLP Short Term Goals - 03/08/16 1302    SLP SHORT TERM GOAL #1   Title pt will demo 23/25 reps of EMST with proper procedure over 4 sessions   Time 4   Period Weeks   Status On-going   SLP SHORT TERM GOAL #2   Title pt will demo oral-motor HEP with rare min A    Time 4   Period Weeks   Status On-going   SLP SHORT TERM GOAL #3   Title pt will demo 70dB average volume in 5 minutes simple conversation over three sessions   Time 4   Period Weeks   Status On-going          SLP Long Term Goals - 03/08/16 1302    SLP LONG TERM GOAL #1   Title pt will demo 25/25 reps of EMST with appropriate procedure over 4 sessions   Time 8   Period Weeks   Status On-going  SLP LONG TERM GOAL #2   Title pt will demo oral motor HEP with SBA over three sessions   Time 8   Period Weeks   Status On-going   SLP LONG TERM GOAL #3   Title pt will produce 10 minutes of simple to mod complex conversation with average 70dB with rare min A over three sessions   Time 8   Period Weeks   Status On-going          Plan - 03/08/16 1159    Clinical Impression Statement Pt presents with mod dysarthria continues. 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 Pt would cont to benefit from skilled ST addressing der'd intelligibility due to reduced breath support and reduced  articulatory precision.   Speech Therapy Frequency 2x / week   Duration --  8 weeks   Treatment/Interventions Oral motor exercises;Compensatory strategies;SLP instruction and feedback;Patient/family education;Functional tasks;Cueing hierarchy  HEP for respiratory muscle training   Potential to Achieve Goals Good   Potential Considerations Severity of impairments   SLP Home Exercise Plan provided today   Consulted and Agree with Plan of Care Patient;Family member/caregiver   Family Member Consulted wife      Patient will benefit from skilled therapeutic intervention in order to improve the following deficits and impairments:   Dysarthria and anarthria    Problem List Patient Active Problem List   Diagnosis Date Noted  . Slow transit constipation   . Neuropathic pain   . Stroke, acute, thrombotic (HCC) 10/08/2015  . 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 Essential hypertension 10/08/2015  . HLD (hyperlipidemia) 10/08/2015  . Hemiplegia and hemiparesis following cerebral infarction affecting right dominant side (HCC)   . Right hemiplegia (HCC) 10/06/2015  . Stroke (cerebrum) (HCC) 10/05/2015  . CVA (cerebral infarction) 10/05/2015    [facility] Medical Center [facility] Memorial CampusCHINKE,CARL ,MS, CCC-SLP  03/08/2016, 1:02 PM  [facility] [address] [place], [place], [address] Phone: [phone]   Fax:  [phone]   Name: Sean Wilkerson MRN: [id number] Date of Birth: 1946/07/11

## 2016-03-08 NOTE — Patient Instructions (Signed)
New instructions for the "blower"  Do 5 sets of 5 reps with 5 second breaks in between. Put a minute rest between each set of 5 reps.  SO: BLOW x5 with 5 second breaks between each blow MINUTE  BREAK                                             (repeat this 4 more times)  Do this at least once more, during 5 days each week

## 2016-03-09 ENCOUNTER — Encounter: Payer: Self-pay | Admitting: Occupational Therapy

## 2016-03-09 ENCOUNTER — Ambulatory Visit: Payer: Medicare Other | Admitting: Occupational Therapy

## 2016-03-09 DIAGNOSIS — M6281 Muscle weakness (generalized): Secondary | ICD-10-CM

## 2016-03-09 DIAGNOSIS — I69353 Hemiplegia and hemiparesis following cerebral infarction affecting right non-dominant side: Secondary | ICD-10-CM

## 2016-03-09 DIAGNOSIS — R2681 Unsteadiness on feet: Secondary | ICD-10-CM

## 2016-03-09 DIAGNOSIS — R2689 Other abnormalities of gait and mobility: Secondary | ICD-10-CM

## 2016-03-09 NOTE — Patient Instructions (Signed)
PROM of R hand, elevation of R hand, will instruct wife in edema massage and bed positioning next session

## 2016-03-09 NOTE — Therapy (Signed)
[facility] [address] [place], [place], [address] Phone: [phone]   Fax:  [phone]  Occupational Therapy Treatment  Patient Details  Name: Sean Wilkerson MRN: [id number] Date of Birth: 1946-04-06 Referring Provider: Dr. Pearlean Brownie  Encounter Date: 03/09/2016      OT End of Session - 03/09/16 1727    Visit Number 2   Number of Visits 17   Date for OT Re-Evaluation 05/02/16   Authorization Type [employer] MCR   Authorization - Visit Number 1   Authorization - Number of Visits 10   OT Start Time 1532   OT Stop Time 1615   OT Time Calculation (min) 43 min   Activity Tolerance Patient tolerated treatment well      Past Medical History  Diagnosis Date  . Hypertension   . HTN (hypertension) 10/08/2015  . HLD (hyperlipidemia) 10/08/2015  . Stroke [facility])     Past Surgical History  Procedure Laterality Date  . No past surgeries      There were no vitals filed for this visit.      Subjective Assessment - 03/09/16 1538    Subjective  My hand is always pretty swollen   Patient is accompained by: Family member  wife   Pertinent History see Epic   Patient Stated Goals to be more indpendent   Currently in Pain? No/denies  states shoulder only hurts when I lay down                      OT Treatments/Exercises (OP) - 03/09/16 0001    Neurological Re-education Exercises   Other Exercises 1 Instructed pt on PROM for finger flexion/extension - after practice pt able to return demonstrate. Pt also able to verbalize importance of self range related to edema mgmt and PROM.   Other Exercises 2 Neuro re ed to address alignment, increased activiation of R side during squat pivot transfers.  Pt initially using a great deal of momentum and very impulsive/unsafe; also intiatelly required mod assist. With practice and max cues, pt able to complete with min a by end of session and disiplay much greater control and  safety.  Pt needs max reminders to stay in a squat and not atttempt to stand during transfers.    Manual Therapy   Manual Therapy Edema management   Manual therapy comments Pt presented today with signficant edema in R hand.  Aggressive edema massage with good results.  Discussed at length with pt need to truly elevate hand above the level of his elbow when sitting and when laying down. Pt able to verbalize understanding. Issued compression glove and asked pt to wear at all times except when showering/dresssing for now with goal to wear only at night with resting hand splint (splint to be fabricated).  Reviewed with wife at end of session when she arrived to pick up patient.                   OT Short Term Goals - 03/09/16 1724    OT SHORT TERM GOAL #1   Title I with inital HEP.- check 04/03/16   Time 4   Period Weeks   Status New   OT SHORT TERM GOAL #2   Title Pt will demonstrate understanding of RUE positioning to minimize pain and risk for injury including splint wear PRN.   Time 4   Period Weeks   Status New   OT SHORT TERM GOAL #3  Title Pt will consistently donn shirt with min A.   Baseline min-mod A   Time 4   Period Weeks   Status New   OT SHORT TERM GOAL #4   Title Pt will perform toilet transfers with min A.   Baseline wife is currently using standing lift, pt has walker with platform   Time 4   Period Weeks   Status New   OT SHORT TERM GOAL #5   Title Pt will donn pants with min A using AE PRN.   Baseline mod for pants, dependent for shoes and socks   Time 4   Period Weeks   Status New   OT SHORT TERM GOAL #6   Title Pt will  perform shower transfer and bathe with min A.   Time 4   Period Weeks   Status New   OT SHORT TERM GOAL #7   Title Pt will tolerate P/ROM shoulder flexion to 90* with no significant increase in pain.   Time 4   Period Weeks   Status New           OT Long Term Goals - 03/09/16 1724    OT LONG TERM GOAL #1   Title Pt will  donn shirt with set up only.- check 05/01/16   Time 8   Period Weeks   Status New   OT LONG TERM GOAL #2   Title Pt will donn pants with minguard for balance.   Time 8   Period Weeks   Status New   OT LONG TERM GOAL #3   Title Pt will donn socks and shoes with min A.   Time 8   Period Weeks   Status New   OT LONG TERM GOAL #4   Title Pt will use RUE as a stabilizer for ADLS at least 25 % of the time with pain less than or equal to 3/10.   Time 8   Period Weeks   Status New   OT LONG TERM GOAL #5   Title Pt will demonstrate P/ROM shoulder flexion to 100* with pain no greater than 3/10.   Time 8   Period Weeks   Status New   OT LONG TERM GOAL #6   Title Pt will demonstrate ability to stand at countertop and retrieve an item from midlevel shelf with LUE, with supervision and no LOB.   Time 8   Period Weeks   Status New               Plan - 03/09/16 1725    Clinical Impression Statement Pt slowly progressing toward goals. Asked wife to attend next session to review aggressive edema massage, bed positionign and begin transfer training   Rehab Potential Good   OT Frequency 2x / week   OT Duration 8 weeks   OT Treatment/Interventions Self-care/ADL training;Therapeutic exercise;Patient/family education;Balance training;Splinting;Neuromuscular education;Ultrasound;Energy conservation;Therapeutic exercises;Therapeutic activities;DME and/or AE instruction;Parrafin;Cryotherapy;Electrical Stimulation;Fluidtherapy;Cognitive remediation/compensation;Visual/perceptual remediation/compensation;Passive range of motion;Contrast Bath;Moist Heat   Plan ed with pt and wife for positioning of LUE, edema mgmt, and begin transfer training with wife doing squat pivot transfers.   Consulted and Agree with Plan of Care Patient;Family member/caregiver   Family Member Consulted wife present at end of session      Patient will benefit from skilled therapeutic intervention in order to improve the  following deficits and impairments:  Decreased coordination, Decreased range of motion, Increased edema, Decreased safety awareness, Decreased endurance, Decreased activity tolerance, Decreased knowledge of precautions, Impaired tone, Obesity,  Pain, Impaired UE functional use, Decreased knowledge of use of DME, Decreased balance, Decreased cognition, Decreased mobility, Decreased strength, Impaired perceived functional ability, Impaired vision/preception  Visit Diagnosis: Hemiplegia and hemiparesis following cerebral infarction affecting right non-dominant side (HCC)  Other abnormalities of gait and mobility  Muscle weakness (generalized)  Unsteadiness on feet    Problem List Patient Active Problem List   Diagnosis Date Noted  . Slow transit constipation   . Neuropathic pain   . Stroke, acute, thrombotic (HCC) 10/08/2015  . 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 Essential hypertension 10/08/2015  . HLD (hyperlipidemia) 10/08/2015  . Hemiplegia and hemiparesis following cerebral infarction affecting right dominant side (HCC)   . Right hemiplegia (HCC) 10/06/2015  . Stroke (cerebrum) (HCC) 10/05/2015  . CVA (cerebral infarction) 10/05/2015    Norton PastelPulaski, Rox Mcgriff Halliday , OTR/L  03/09/2016, 5:28 PM  [facility] [address] [place], [place], [address] Phone: [phone]   Fax:  [phone]  Name: Adolphus BirchwoodLarry Lee Stanard MRN: [id number] Date of Birth: [birth year]

## 2016-03-11 ENCOUNTER — Ambulatory Visit: Payer: Medicare Other | Admitting: Occupational Therapy

## 2016-03-11 ENCOUNTER — Encounter: Payer: Self-pay | Admitting: Occupational Therapy

## 2016-03-11 DIAGNOSIS — M6281 Muscle weakness (generalized): Secondary | ICD-10-CM

## 2016-03-11 DIAGNOSIS — I69353 Hemiplegia and hemiparesis following cerebral infarction affecting right non-dominant side: Secondary | ICD-10-CM

## 2016-03-11 DIAGNOSIS — R2689 Other abnormalities of gait and mobility: Secondary | ICD-10-CM

## 2016-03-11 DIAGNOSIS — R2681 Unsteadiness on feet: Secondary | ICD-10-CM

## 2016-03-11 NOTE — Therapy (Signed)
[facility] [address] [place], [place], [address] Phone: [phone]   Fax:  [phone]  Occupational Therapy Treatment  Patient Details  Name: Sean Wilkerson MRN: [id number] Date of Birth: Jan 12, 1946 Referring Provider: Dr. Pearlean BrownieSethi  Encounter Date: 03/11/2016      OT End of Session - 03/11/16 1727    Visit Number 3   Number of Visits 17   Date for OT Re-Evaluation 05/02/16   Authorization Type UMR MCR   Authorization - Visit Number 3   Authorization - Number of Visits 10   OT Start Time 1535   OT Stop Time 1617   OT Time Calculation (min) 42 min   Activity Tolerance Patient tolerated treatment well   Behavior During Therapy [facility] for tasks assessed/performed      Past Medical History  Diagnosis Date  . Hypertension   . HTN (hypertension) 10/08/2015  . HLD (hyperlipidemia) 10/08/2015  . Stroke [facility](HCC)     Past Surgical History  Procedure Laterality Date  . No past surgeries      There were no vitals filed for this visit.      Subjective Assessment - 03/11/16 1719    Subjective  I want to be more independent   Patient is accompained by: Family member  wife [place]   Pertinent History see Epic   Patient Stated Goals to be more indpendent   Currently in Pain? No/denies   Pain Score 0-No pain                      OT Treatments/Exercises (OP) - 03/11/16 0001    Neurological Re-education Exercises   Other Exercises 1 Neuromuscular reeducation to address shoulder positioning in bed at night.  Patient reported discomfort in shoulder last night,a nd reviewed with patient and wife the benfeit of supporting distal end of humerus to prevent pain, and help manage edema in right hand.  Sidesitting position to begin light weight bearing and proprioceptive input into right arm.  Patient tolerated this position, and able to begin to activate shoulder stabilizers with body on arm motion.  Patient  easily fatigued, had difficulty sustaining scapular / trunk activation more than brief moments.     Other Exercises 2 In sitting worked to bias patient toward right side while activating more upright posture throughout trunk.  Patient able to accept weight bearing (light) through long arm in this position,and then followed immediately with active motion of right shoulder for adduction, flexion, and even slight extension.  Patient very excited to see arm moving, stating, "there is life in this arm."                OT Education - 03/11/16 1726    Education provided Yes   Education Details reviewed edema management techniques with patient and wife - significant decrease in swelling in hand since Tuesday!  Reviewed bed positioning to aide with shoulder discomfort   Person(s) Educated Patient;Spouse   Methods Explanation;Demonstration   Comprehension Verbalized understanding          OT Short Term Goals - 03/11/16 1730    OT SHORT TERM GOAL #1   Title I with inital HEP.- check 04/03/16   Status On-going   OT SHORT TERM GOAL #2   Title Pt will demonstrate understanding of RUE positioning to minimize pain and risk for injury including splint wear PRN.   Status On-going   OT SHORT TERM GOAL #3   Title Pt will  consistently donn shirt with min A.   Status On-going   OT SHORT TERM GOAL #4   Title Pt will perform toilet transfers with min A.   Status On-going   OT SHORT TERM GOAL #5   Title Pt will donn pants with min A using AE PRN.   Status On-going   OT SHORT TERM GOAL #6   Title Pt will  perform shower transfer and bathe with min A.   Status On-going   OT SHORT TERM GOAL #7   Title Pt will tolerate P/ROM shoulder flexion to 90* with no significant increase in pain.   Status On-going           OT Long Term Goals - 03/09/16 1724    OT LONG TERM GOAL #1   Title Pt will donn shirt with set up only.- check 05/01/16   Time 8   Period Weeks   Status New   OT LONG TERM GOAL #2    Title Pt will donn pants with minguard for balance.   Time 8   Period Weeks   Status New   OT LONG TERM GOAL #3   Title Pt will donn socks and shoes with min A.   Time 8   Period Weeks   Status New   OT LONG TERM GOAL #4   Title Pt will use RUE as a stabilizer for ADLS at least 25 % of the time with pain less than or equal to 3/10.   Time 8   Period Weeks   Status New   OT LONG TERM GOAL #5   Title Pt will demonstrate P/ROM shoulder flexion to 100* with pain no greater than 3/10.   Time 8   Period Weeks   Status New   OT LONG TERM GOAL #6   Title Pt will demonstrate ability to stand at countertop and retrieve an item from midlevel shelf with LUE, with supervision and no LOB.   Time 8   Period Weeks   Status New               Plan - 03/11/16 1728    Clinical Impression Statement Patient showing steady improvement and has shown marked decrease in edema in hand.  Patient beginning to show slight movement in right arm at shoulder - prefunctional at this time, although motivating to patient.     Rehab Potential Good   Clinical Impairments Affecting Rehab Potential Potential for depression- will monitor and offer support, resources as warranted   OT Frequency 2x / week   OT Duration 8 weeks   OT Treatment/Interventions Self-care/ADL training;Therapeutic exercise;Patient/family education;Balance training;Splinting;Neuromuscular education;Ultrasound;Energy conservation;Therapeutic exercises;Therapeutic activities;DME and/or AE instruction;Parrafin;Cryotherapy;Electrical Stimulation;Fluidtherapy;Cognitive remediation/compensation;Visual/perceptual remediation/compensation;Passive range of motion;Contrast Bath;Moist Heat   Consulted and Agree with Plan of Care Patient;Family member/caregiver   Family Member Consulted wife present at end of session      Patient will benefit from skilled therapeutic intervention in order to improve the following deficits and impairments:   Decreased coordination, Decreased range of motion, Increased edema, Decreased safety awareness, Decreased endurance, Decreased activity tolerance, Decreased knowledge of precautions, Impaired tone, Obesity, Pain, Impaired UE functional use, Decreased knowledge of use of DME, Decreased balance, Decreased cognition, Decreased mobility, Decreased strength, Impaired perceived functional ability, Impaired vision/preception  Visit Diagnosis: Hemiplegia and hemiparesis following cerebral infarction affecting right non-dominant side (HCC)  Other abnormalities of gait and mobility  Muscle weakness (generalized)  Unsteadiness on feet    Problem List Patient Active Problem List  Diagnosis Date Noted  . Slow transit constipation   . Neuropathic pain   . Stroke, acute, thrombotic (HCC) 10/08/2015  . 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 Essential hypertension 10/08/2015  . HLD (hyperlipidemia) 10/08/2015  . Hemiplegia and hemiparesis following cerebral infarction affecting right dominant side (HCC)   . Right hemiplegia (HCC) 10/06/2015  . Stroke (cerebrum) (HCC) 10/05/2015  . CVA (cerebral infarction) 10/05/2015    Collier Salina, OTR/L 03/11/2016, 5:31 PM  [facility] [address] [place], [place], [address] Phone: [phone]   Fax:  [phone]  Name: Sean Wilkerson MRN: [id number] Date of Birth: 12/01/1945

## 2016-03-12 ENCOUNTER — Ambulatory Visit: Payer: Medicare Other | Admitting: Physical Therapy

## 2016-03-12 DIAGNOSIS — I69353 Hemiplegia and hemiparesis following cerebral infarction affecting right non-dominant side: Secondary | ICD-10-CM | POA: Diagnosis not present

## 2016-03-12 DIAGNOSIS — R2689 Other abnormalities of gait and mobility: Secondary | ICD-10-CM

## 2016-03-12 DIAGNOSIS — M6281 Muscle weakness (generalized): Secondary | ICD-10-CM

## 2016-03-12 DIAGNOSIS — R2681 Unsteadiness on feet: Secondary | ICD-10-CM

## 2016-03-12 NOTE — Patient Instructions (Signed)
Bridging    Slowly raise buttocks from floor, keeping stomach tight. Repeat __10__ times per set. Do __1__ sets per session. Do _2-3___ sessions per day.  http://orth.exer.us/1096   Copyright  VHI. All rights reserved.   CAREGIVER ASSISTED: Ankle Dorsiflexion    Caregiver cups hand under heel, rests foot on forearm; leans forward to move foot toward head. Hold _20__ seconds. __2-3_ reps per set, _2-3__ sets per day.   Copyright  VHI. All rights reserved.   CAREGIVER ASSISTED: Hip / Knee Flexion    Caregiver holds leg at knee and heel; raises knee to chest.  Resist as he pushes leg straight out. Perform 10 reps.     Copyright  VHI. All rights reserved.   Abduction / External Rotation: ROM (Supine)    Position (A) Patient: Bend left leg, foot flat on bed. Helper: Stabilize hip. Place other hand on knee. Move knee out to side, pelvis remains flat on bed. Repeat _10__ times.  Do _2-3__ sessions per day.  Copyright  VHI. All rights reserved.

## 2016-03-12 NOTE — Therapy (Signed)
[facility] [address] [place], [place], [address] Phone: [phone]   Fax:  [phone]  Physical Therapy Treatment  Patient Details  Name: Sean Wilkerson MRN: [id number] Date of Birth: April 13, 1946 Referring Provider: Dr. Delia HeadyPramod Wilkerson  Encounter Date: 03/12/2016      PT End of Session - 03/12/16 1153    Visit Number 2   Number of Visits 17   Authorization Type UHC Medicare   Authorization Time Period 03-05-16 - 05-04-16   PT Start Time 1100   PT Stop Time 1144   PT Time Calculation (min) 44 min   Equipment Utilized During Treatment Gait belt   Activity Tolerance Patient tolerated treatment well   Behavior During Therapy [facility] for tasks assessed/performed      Past Medical History  Diagnosis Date  . Hypertension   . HTN (hypertension) 10/08/2015  . HLD (hyperlipidemia) 10/08/2015  . Stroke [name](HCC)     Past Surgical History  Procedure Laterality Date  . No past surgeries      There were no vitals filed for this visit.                       [facility] Adult PT Treatment/Exercise - 03/12/16 1148    Transfers   Transfers Sit to Stand;Stand to Genuine PartsSit;Stand Pivot Transfers   Sit to Stand 4: Min guard   Stand to Sit 4: Min guard   Stand Pivot Transfers 4: Min assist   Stand Pivot Transfer Details (indicate cue type and reason) use of PFRW with mod cues for safety and technique   Ambulation/Gait   Ambulation/Gait Yes   Ambulation/Gait Assistance 4: Min assist   Ambulation/Gait Assistance Details without KAFO   Ambulation Distance (Feet) 20 Feet  then 15'   Assistive device Right platform walker   Gait Pattern Step-to pattern;Decreased hip/knee flexion - right;Decreased stance time - right;Decreased step length - right;Decreased dorsiflexion - right;Right genu recurvatum   Ambulation Surface Level;Indoor   Gait Comments significant R genu recurvatum in stance without KAFO   Exercises   Exercises Knee/Hip   Knee/Hip Exercises: Stretches   Gastroc Stretch Right;3 reps;30 seconds   Gastroc Stretch Limitations educated wife on how to safely perform   Knee/Hip Exercises: Supine   Heel Slides Right;10 reps   Heel Slides Limitations with caregiver resisted extension   Bridges 10 reps   Other Supine Knee/Hip Exercises hooklying R slamshell with caregiver assist x 10                PT Education - 03/12/16 1152    Education provided Yes   Education Details HEP; amb at home (recommend to hold at this time but to return to static standing and weight shifting as instructed by HHPT)   Person(s) Educated Patient;Spouse   Methods Explanation;Handout;Demonstration   Comprehension Verbalized understanding;Returned demonstration;Need further instruction          PT Short Term Goals - 03/06/16 1912    PT SHORT TERM GOAL #1   Title Pt will perform basic transfers with S only - wheelchair to/from mat.  (04-05-16)   Baseline min to mod assist with stand pivot   Time 4   Status New   PT SHORT TERM GOAL #2   Title Stand for at least 3" with intermittent UE support at counter without KAFO on RLE with SBA for incr. independence with ADL's.  (04-05-16)   Time 4   Period Weeks   Status New  PT SHORT TERM GOAL #3   Title Amb. 150' with platform RW with CGA on flat, even surface.  (04-05-16)   Baseline 50' with RW   Time 4   Period Weeks   Status New   PT SHORT TERM GOAL #4   Title Perform bed mobility including sit to/from supine with S.  (04-05-16)   Baseline mod to min assist required per wife's report   Time 4   Period Weeks   Status New   PT SHORT TERM GOAL #5   Title Independent in HEP for RLE strengthening.  (04-05-16)   Time 4   Period Weeks   Status New           PT Long Term Goals - 03/06/16 1917    PT LONG TERM GOAL #1   Title Modified independent with basic transfers.  (05-04-16)   Time 8   Period Weeks   Status New   PT LONG TERM GOAL #2   Title  Modified independent with bed mobility.  (05-04-16)   Time 8   Period Weeks   Status New   PT LONG TERM GOAL #3   Title Amb. 37' with hemiwalker with S for incr. household amb.  (05-04-16)   Time 8   Period Weeks   Status New   PT LONG TERM GOAL #4   Title Amb. 49' with hemiwalker with CGA for incr. community accessibility.  (05-04-16)   Time 8   Period Weeks   Status New   PT LONG TERM GOAL #5   Title Negotiate steps (4) with min assist with use of hand rail .  (05-04-16)   Time 8   Period Weeks   Status New   Additional Long Term Goals   Additional Long Term Goals Yes   PT LONG TERM GOAL #6   Title Perform Berg balance test when appropriate and establish goal as needed.  (05-04-16)   Time 8   Period Weeks   Status New               Plan - 03/12/16 1153    Clinical Impression Statement Initiated HEP for home with caregiver/spouse assist.  Pt reports fatigue at end of session.  Pt with significant R knee recurvatum in stance without KAFO and feel amb safest with KAFO to decrease risk of injury.  Pt reluctant to amb with KAFO due to bulkiness of orthotic.   PT Next Visit Plan gait training with platform RW; standing exercises; RLE strengthening   PT Home Exercise Plan RLE strengthening   Consulted and Agree with Plan of Care Patient;Family member/caregiver   Family Member Consulted spouse Sean Wilkerson      Patient will benefit from skilled therapeutic intervention in order to improve the following deficits and impairments:     Visit Diagnosis: Hemiplegia and hemiparesis following cerebral infarction affecting right non-dominant side (HCC)  Other abnormalities of gait and mobility  Muscle weakness (generalized)  Unsteadiness on feet     Problem List Patient Active Problem List   Diagnosis Date Noted  . Slow transit constipation   . Neuropathic pain   . Stroke, acute, thrombotic (HCC) 10/08/2015  . Hemiparesis, aphasia, and dysphagia as late effect of cerebrovascular  accident (CVA) (HCC) 10/08/2015  . Essential hypertension 10/08/2015  . HLD (hyperlipidemia) 10/08/2015  . Hemiplegia and hemiparesis following cerebral infarction affecting right dominant side (HCC)   . Right hemiplegia (HCC) 10/06/2015  . Stroke (cerebrum) (HCC) 10/05/2015  . CVA (cerebral infarction) 10/05/2015  Clarita Crane, PT, DPT 03/12/2016 11:56 AM  [facility] [address] [place], [place], [address] Phone: [phone]   Fax:  [phone]  Name: Sean Wilkerson MRN: [id number] Date of Birth: [birth year]

## 2016-03-16 ENCOUNTER — Ambulatory Visit: Payer: Medicare Other | Admitting: Occupational Therapy

## 2016-03-16 ENCOUNTER — Encounter: Payer: Self-pay | Admitting: Occupational Therapy

## 2016-03-16 DIAGNOSIS — R278 Other lack of coordination: Secondary | ICD-10-CM

## 2016-03-16 DIAGNOSIS — I69353 Hemiplegia and hemiparesis following cerebral infarction affecting right non-dominant side: Secondary | ICD-10-CM | POA: Diagnosis not present

## 2016-03-16 DIAGNOSIS — M6281 Muscle weakness (generalized): Secondary | ICD-10-CM

## 2016-03-16 DIAGNOSIS — R2681 Unsteadiness on feet: Secondary | ICD-10-CM

## 2016-03-16 NOTE — Therapy (Signed)
[facility] [address] [place], [place], [address] Phone: [phone]   Fax:  [phone]  Occupational Therapy Treatment  Patient Details  Name: Antionio Negron MRN: [id number] Date of Birth: [birth year] Referring Provider: Dr. Pearlean Brownie  Encounter Date: 03/16/2016      OT End of Session - 03/16/16 1619    Visit Number 4   Number of Visits 17   Date for OT Re-Evaluation 05/02/16   Authorization Type UMR MCR   Authorization - Visit Number 4   Authorization - Number of Visits 10   OT Start Time 1450   OT Stop Time 1537   OT Time Calculation (min) 47 min   Activity Tolerance Patient tolerated treatment well   Behavior During Therapy [facility] for tasks assessed/performed      Past Medical History  Diagnosis Date  . Hypertension   . HTN (hypertension) 10/08/2015  . HLD (hyperlipidemia) 10/08/2015  . Stroke [facility])     Past Surgical History  Procedure Laterality Date  . No past surgeries      There were no vitals filed for this visit.      Subjective Assessment - 03/16/16 1609    Subjective  I am doing all I can   Patient is accompained by: Family member   Pertinent History see Epic   Patient Stated Goals to be more indpendent   Currently in Pain? No/denies   Pain Score 0-No pain                      OT Treatments/Exercises (OP) - 03/16/16 0001    ADLs   Toileting Patient is currently using a steady lift to transfer into and out of bathroom as wheelchair is too big for this space. Patient really hopes to be independent with toileting,a nd wife indicates he is able to complete hygiene, and can assist with clothing management.  Patient will need to walk into bathroom.  In the past he has walked with platform walker - will need to incorporate this into OP OT sessions.     Functional Mobility In sit to stand transition, patient with tendency to lift off surface too early, insufficient forward  weight shift, and this impedes balance in standing - posterior bias/ strong bias toward left side.  Need to encourage more symmetrical sit to stand transition to improve balance in static and dynamic standing condiitions.    Neurological Re-education Exercises   Other Exercises 1 Neuromuscular reeducation in sitting to facilitate muscle activation in right shoulder.  Patient needing faciltiation to bias weight toward weaker right side,a nd assistance to weight bear through long right arm.  PAtient with occasional flicker of activity to stabilize elbow extension - difficutl to reporduce, unable to sustain.  Following dynamic weight bearing, patient able to isolate shoulder motions, flexion, extension, adduction.  Patient able to utilize right UE to assist with transition from side sitting to sitting with mod facilitation.  Patient able to feel activation in right shoulder and elbow - and showing excitement / recognition of movement.                  OT Education - 03/16/16 1618    Education provided Yes   Education Details Sit to stand transition, standing weight shift onto right leg for stand step transfer   Person(s) Educated Patient;Spouse   Methods Explanation;Demonstration   Comprehension Need further instruction;Tactile cues required          OT  Short Term Goals - 03/11/16 1730    OT SHORT TERM GOAL #1   Title I with inital HEP.- check 04/03/16   Status On-going   OT SHORT TERM GOAL #2   Title Pt will demonstrate understanding of RUE positioning to minimize pain and risk for injury including splint wear PRN.   Status On-going   OT SHORT TERM GOAL #3   Title Pt will consistently donn shirt with min A.   Status On-going   OT SHORT TERM GOAL #4   Title Pt will perform toilet transfers with min A.   Status On-going   OT SHORT TERM GOAL #5   Title Pt will donn pants with min A using AE PRN.   Status On-going   OT SHORT TERM GOAL #6   Title Pt will  perform shower transfer and  bathe with min A.   Status On-going   OT SHORT TERM GOAL #7   Title Pt will tolerate P/ROM shoulder flexion to 90* with no significant increase in pain.   Status On-going           OT Long Term Goals - 03/09/16 1724    OT LONG TERM GOAL #1   Title Pt will donn shirt with set up only.- check 05/01/16   Time 8   Period Weeks   Status New   OT LONG TERM GOAL #2   Title Pt will donn pants with minguard for balance.   Time 8   Period Weeks   Status New   OT LONG TERM GOAL #3   Title Pt will donn socks and shoes with min A.   Time 8   Period Weeks   Status New   OT LONG TERM GOAL #4   Title Pt will use RUE as a stabilizer for ADLS at least 25 % of the time with pain less than or equal to 3/10.   Time 8   Period Weeks   Status New   OT LONG TERM GOAL #5   Title Pt will demonstrate P/ROM shoulder flexion to 100* with pain no greater than 3/10.   Time 8   Period Weeks   Status New   OT LONG TERM GOAL #6   Title Pt will demonstrate ability to stand at countertop and retrieve an item from midlevel shelf with LUE, with supervision and no LOB.   Time 8   Period Weeks   Status New               Plan - 03/16/16 1620    Clinical Impression Statement Patient showing steady progress with awareness and beginning activation in right upper extremity.  Patient is becoming slightly more animated in his interactions, especially when he experiences right sided movement.  Patient desires increased independence with basic ADL.     Rehab Potential Good   Clinical Impairments Affecting Rehab Potential Potential for depression- will monitor and offer support, resources as warranted   OT Frequency 2x / week   OT Duration 8 weeks   OT Treatment/Interventions Self-care/ADL training;Therapeutic exercise;Patient/family education;Balance training;Splinting;Neuromuscular education;Ultrasound;Energy conservation;Therapeutic exercises;Therapeutic activities;DME and/or AE  instruction;Parrafin;Cryotherapy;Electrical Stimulation;Fluidtherapy;Cognitive remediation/compensation;Visual/perceptual remediation/compensation;Passive range of motion;Contrast Bath;Moist Heat   Plan Incorporate walking into session - Bathroom transfers   Consulted and Agree with Plan of Care Patient;Family member/caregiver   Family Member Consulted wife       Patient will benefit from skilled therapeutic intervention in order to improve the following deficits and impairments:  Decreased coordination, Decreased range of motion, Increased  edema, Decreased safety awareness, Decreased endurance, Decreased activity tolerance, Decreased knowledge of precautions, Impaired tone, Obesity, Pain, Impaired UE functional use, Decreased knowledge of use of DME, Decreased balance, Decreased cognition, Decreased mobility, Decreased strength, Impaired perceived functional ability, Impaired vision/preception  Visit Diagnosis: 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 Slow transit constipation   . Neuropathic pain   . Stroke, acute, thrombotic (HCC) 10/08/2015  . 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 Essential hypertension 10/08/2015  . HLD (hyperlipidemia) 10/08/2015  . Hemiplegia and hemiparesis following cerebral infarction affecting right dominant side (HCC)   . Right hemiplegia (HCC) 10/06/2015  . Stroke (cerebrum) (HCC) 10/05/2015  . CVA (cerebral infarction) 10/05/2015    Collier SalinaGellert, Chanita Boden M, OTR/L 03/16/2016, 4:26 PM  [facility] [address] [place], [place], [address] Phone: [phone]   Fax:  [phone]  Name: Adolphus BirchwoodLarry Lee Loren MRN:  [id number] Date of Birth: 1946-03-30

## 2016-03-17 ENCOUNTER — Ambulatory Visit: Payer: Medicare Other

## 2016-03-17 ENCOUNTER — Encounter: Payer: Self-pay | Admitting: Occupational Therapy

## 2016-03-17 ENCOUNTER — Ambulatory Visit: Payer: Medicare Other | Admitting: Occupational Therapy

## 2016-03-17 DIAGNOSIS — R471 Dysarthria and anarthria: Secondary | ICD-10-CM

## 2016-03-17 DIAGNOSIS — R2681 Unsteadiness on feet: Secondary | ICD-10-CM

## 2016-03-17 DIAGNOSIS — I69353 Hemiplegia and hemiparesis following cerebral infarction affecting right non-dominant side: Secondary | ICD-10-CM | POA: Diagnosis not present

## 2016-03-17 DIAGNOSIS — R278 Other lack of coordination: Secondary | ICD-10-CM

## 2016-03-17 DIAGNOSIS — M6281 Muscle weakness (generalized): Secondary | ICD-10-CM

## 2016-03-17 NOTE — Therapy (Signed)
[facility] [address] [place], [place], [address] Phone: [phone]   Fax:  [phone]  Speech Language Pathology Treatment  Patient Details  Name: Sean Wilkerson MRN: [id number] Date of Birth: 21-Oct-1945 Referring Provider: Delia HeadySethi, Pramod  Encounter Date: 03/17/2016      End of Session - 03/17/16 1400    Visit Number 3   Number of Visits 17   Date for SLP Re-Evaluation 05/14/16   SLP Start Time 1317   SLP Stop Time  1400   SLP Time Calculation (min) 43 min   Activity Tolerance Patient tolerated treatment well      Past Medical History  Diagnosis Date  . Hypertension   . HTN (hypertension) 10/08/2015  . HLD (hyperlipidemia) 10/08/2015  . Stroke [facility](HCC)     Past Surgical History  Procedure Laterality Date  . No past surgeries      There were no vitals filed for this visit.      Subjective Assessment - 03/17/16 1321    Subjective Hoarseness continues.   Currently in Pain? No/denies               ADULT SLP TREATMENT - 03/17/16 1321    General Information   Behavior/Cognition Alert;Cooperative;Pleasant mood   Treatment Provided   Treatment provided Cognitive-Linquistic   Cognitive-Linquistic Treatment   Treatment focused on Dysarthria   Skilled Treatment Pt req'd min A occasionally from SLP for 5-second break between reps, and occasional min-mod cues for full breath - leading to "bike pump" sound. Notable difference between first set of reps and 4th set as pt's labial seal appeared to weaken with the more reps produced. With reading of multisyllable words, pt did so with 80% intelligibility, which incr'd to 90% with SLP min-mod cues for using compensations (reduced rate, overarticulation). Loud /a/ with SLP example and mod A for abdominal involvement with production. Pt with much louder voice with abdominal push than without it. Average 80dB. In one-word answers to questions by SLP, pt  demo'd voice quality improvement 75% of the time. SLP told pt he needed to obtain full breath as well as produce louder speech like he is presently, to be best understood. In phrase responses, pt req'd min to mod A for abdominal push and full breath, but pt was 80% successful at improved vocal quality and intelligibility.    Assessment / Recommendations / Plan   Plan Continue with current plan of care   Progression Toward Goals   Progression toward goals Progressing toward goals          SLP Education - 03/17/16 1359    Education provided Yes   Education Details full breath, abdominal push needed for improved speech production   Person(s) Educated Patient;Spouse   Methods Explanation;Demonstration;Verbal cues   Comprehension Verbalized understanding;Returned demonstration;Need further instruction          SLP Short Term Goals - 03/17/16 1407    SLP SHORT TERM GOAL #1   Title pt will demo 23/25 reps of EMST with proper procedure over 4 sessions   Time 3   Period Weeks   Status On-going   SLP SHORT TERM GOAL #2   Title pt will demo oral-motor HEP with rare min A    Time 3   Period Weeks   Status On-going   SLP SHORT TERM GOAL #3   Title pt will demo 70dB average volume in 5 minutes simple conversation over three sessions   Time 3   Period  Weeks   Status On-going          SLP Long Term Goals - 03/17/16 1407    SLP LONG TERM GOAL #1   Title pt will demo 25/25 reps of EMST with appropriate procedure over 4 sessions   Time 7   Period Weeks   Status On-going   SLP LONG TERM GOAL #2   Title pt will demo oral motor HEP with SBA over three sessions   Time 7   Period Weeks   Status On-going   SLP LONG TERM GOAL #3   Title pt will produce 10 minutes of simple to mod complex conversation with average 70dB with rare min A over three sessions   Time 7   Period Weeks   Status On-going          Plan - 03/17/16 1406    Clinical Impression Statement Pt presents with mod  dysarthria continues. Pt would cont to benefit from skilled ST addressing der'd intelligibility due to reduced breath support and reduced articulatory precision.   Speech Therapy Frequency 2x / week   Duration --  7 weeks   Treatment/Interventions Oral motor exercises;Compensatory strategies;SLP instruction and feedback;Patient/family education;Functional tasks;Cueing hierarchy  HEP for respiratory muscle training   Potential to Achieve Goals Good   Potential Considerations Severity of impairments   SLP Home Exercise Plan provided today   Consulted and Agree with Plan of Care Patient;Family member/caregiver   Family Member Consulted wife      Patient will benefit from skilled therapeutic intervention in order to improve the following deficits and impairments:   Dysarthria and anarthria    Problem List Patient Active Problem List   Diagnosis Date Noted  . Slow transit constipation   . Neuropathic pain   . Stroke, acute, thrombotic (HCC) 10/08/2015  . 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 Essential hypertension 10/08/2015  . HLD (hyperlipidemia) 10/08/2015  . Hemiplegia and hemiparesis following cerebral infarction affecting right dominant side (HCC)   . Right hemiplegia (HCC) 10/06/2015  . Stroke (cerebrum) (HCC) 10/05/2015  . CVA (cerebral infarction) 10/05/2015    [facility] ,MS, CCC-SLP  03/17/2016, 2:08 PM  [facility] [address] [place], [place], [address] Phone: [phone]   Fax:  [phone]   Name: Sean Wilkerson MRN: [id number] Date of Birth: 1946-09-26

## 2016-03-17 NOTE — Patient Instructions (Signed)
  Please complete the assigned speech therapy homework and return it to your next session.  

## 2016-03-17 NOTE — Therapy (Signed)
[facility] [address] [place], [place], [address] Phone: [phone]   Fax:  [phone]  Occupational Therapy Treatment  Patient Details  Name: Sean Wilkerson MRN: [id number] Date of Birth: Dec 10, 1945 Referring Provider: Dr. Pearlean BrownieSethi  Encounter Date: 03/17/2016      OT End of Session - 03/17/16 1520    Visit Number 5   Number of Visits 17   Date for OT Re-Evaluation 05/02/16   Authorization Type [employer] MCR   Authorization - Visit Number 5   Authorization - Number of Visits 10   OT Start Time 1407   OT Stop Time 1450   OT Time Calculation (min) 43 min   Activity Tolerance Patient tolerated treatment well   Behavior [facility] Therapy [facility] for tasks assessed/performed      Past Medical History  Diagnosis Date  . Hypertension   . HTN (hypertension) 10/08/2015  . HLD (hyperlipidemia) 10/08/2015  . Stroke [facility](HCC)     Past Surgical History  Procedure Laterality Date  . No past surgeries      There were no vitals filed for this visit.      Subjective Assessment - 03/17/16 1510    Subjective  I want to be independent, and use my arm   Patient is accompained by: Family member   Pertinent History see Epic   Patient Stated Goals to be more indpendent   Currently in Pain? No/denies   Pain Score 0-No pain                      OT Treatments/Exercises (OP) - 03/17/16 0001    ADLs   Functional Mobility Patient needs mod facilitation for sufficient forward weight shift in transitioning from sit to stand to prevent knee hyperextension, and for safe transition.  Patient has walked with platform walker.  Patient with step to pattern and poor weight translation onto right leg.  Patient's knee is unstable, and he tends toward posterior weight preference and knee hyperextension.  When patient's stepping pattern is slowed, and body weight is shifted forward over his base of support, he shows improved control of  right knee and hip, and can step through with left foot.  Patient's ability to manage his mass over his base of support will be critical for upper extremity functioning.  Patient shows much greater activation in right arm when active throughout right side of body - and aligned over his feet.     Neurological Re-education Exercises   Other Exercises 1 Neuromuscular reeducation in standing to address whole body alignment and activation to allow potential functional activity in right arm,  Patient today able to reach forward toward door handle (synergistic flexor pattern) and press down on handle to open door.  Patient with DENSE hemiplegia, and while this was patterned movement - this was his first attempt at a functional task.  It is imperative that he have a sufficient demand placed on him to actually obtain muscle activation.  Patient needing a great deal of encouragement, but able to open door with only minimal assistance.                  OT Education - 03/17/16 1520    Education provided Yes   Education Details Weight shifting in sit to stand and in stepping   Person(s) Educated Patient;Spouse   Methods Explanation;Demonstration;Tactile cues   Comprehension Need further instruction          OT Short Term Goals -  03/17/16 1523    OT SHORT TERM GOAL #1   Title I with inital HEP.- check 04/03/16   Status Achieved   OT SHORT TERM GOAL #2   Title Pt will demonstrate understanding of RUE positioning to minimize pain and risk for injury including splint wear PRN.   Status On-going   OT SHORT TERM GOAL #3   Title Pt will consistently donn shirt with min A.   Status On-going   OT SHORT TERM GOAL #4   Title Pt will perform toilet transfers with min A.   Status On-going   OT SHORT TERM GOAL #5   Title Pt will donn pants with min A using AE PRN.   Status On-going           OT Long Term Goals - 03/09/16 1724    OT LONG TERM GOAL #1   Title Pt will donn shirt with set up only.-  check 05/01/16   Time 8   Period Weeks   Status New   OT LONG TERM GOAL #2   Title Pt will donn pants with minguard for balance.   Time 8   Period Weeks   Status New   OT LONG TERM GOAL #3   Title Pt will donn socks and shoes with min A.   Time 8   Period Weeks   Status New   OT LONG TERM GOAL #4   Title Pt will use RUE as a stabilizer for ADLS at least 25 % of the time with pain less than or equal to 3/10.   Time 8   Period Weeks   Status New   OT LONG TERM GOAL #5   Title Pt will demonstrate P/ROM shoulder flexion to 100* with pain no greater than 3/10.   Time 8   Period Weeks   Status New   OT LONG TERM GOAL #6   Title Pt will demonstrate ability to stand at countertop and retrieve an item from midlevel shelf with LUE, with supervision and no LOB.   Time 8   Period Weeks   Status New               Plan - 03/17/16 1521    Clinical Impression Statement Patient showing steady progress with awareness and beginning activation in right upper extremity, lower extremity.     Rehab Potential Good   Clinical Impairments Affecting Rehab Potential Potential for depression- will monitor and offer support, resources as warranted   OT Frequency 2x / week   OT Duration 8 weeks   OT Treatment/Interventions Self-care/ADL training;Therapeutic exercise;Patient/family education;Balance training;Splinting;Neuromuscular education;Ultrasound;Energy conservation;Therapeutic exercises;Therapeutic activities;DME and/or AE instruction;Parrafin;Cryotherapy;Electrical Stimulation;Fluidtherapy;Cognitive remediation/compensation;Visual/perceptual remediation/compensation;Passive range of motion;Contrast Bath;Moist Heat   Plan Dynamic sitting and standing to obtain right UE activation - weight bearing through right UE - follow with active motion   Consulted and Agree with Plan of Care Patient;Family member/caregiver   Family Member Consulted wife       Patient will benefit from skilled  therapeutic intervention in order to improve the following deficits and impairments:  Decreased coordination, Decreased range of motion, Increased edema, Decreased safety awareness, Decreased endurance, Decreased activity tolerance, Decreased knowledge of precautions, Impaired tone, Obesity, Pain, Impaired UE functional use, Decreased knowledge of use of DME, Decreased balance, Decreased cognition, Decreased mobility, Decreased strength, Impaired perceived functional ability, Impaired vision/preception  Visit Diagnosis: Hemiplegia and hemiparesis following cerebral infarction affecting right non-dominant side (HCC)  Muscle weakness (generalized)  Unsteadiness on feet  Other lack of  coordination    Problem List Patient Active Problem List   Diagnosis Date Noted  . Slow transit constipation   . Neuropathic pain   . Stroke, acute, thrombotic (HCC) 10/08/2015  . 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 Essential hypertension 10/08/2015  . HLD (hyperlipidemia) 10/08/2015  . Hemiplegia and hemiparesis following cerebral infarction affecting right dominant side (HCC)   . Right hemiplegia (HCC) 10/06/2015  . Stroke (cerebrum) (HCC) 10/05/2015  . CVA (cerebral infarction) 10/05/2015    Collier Salina, OTR/L 03/17/2016, 3:24 PM  [facility] [address] [place], [place], [address] Phone: [phone]   Fax:  [phone]  Name: Sean Wilkerson MRN: [id number] Date of Birth: 09-Jul-1946

## 2016-03-18 ENCOUNTER — Ambulatory Visit: Payer: Medicare Other | Attending: Neurology | Admitting: Physical Therapy

## 2016-03-18 DIAGNOSIS — R278 Other lack of coordination: Secondary | ICD-10-CM | POA: Insufficient documentation

## 2016-03-18 DIAGNOSIS — R2681 Unsteadiness on feet: Secondary | ICD-10-CM | POA: Insufficient documentation

## 2016-03-18 DIAGNOSIS — R2689 Other abnormalities of gait and mobility: Secondary | ICD-10-CM

## 2016-03-18 DIAGNOSIS — R471 Dysarthria and anarthria: Secondary | ICD-10-CM | POA: Insufficient documentation

## 2016-03-18 DIAGNOSIS — M6281 Muscle weakness (generalized): Secondary | ICD-10-CM | POA: Diagnosis not present

## 2016-03-18 DIAGNOSIS — I69353 Hemiplegia and hemiparesis following cerebral infarction affecting right non-dominant side: Secondary | ICD-10-CM | POA: Diagnosis present

## 2016-03-19 ENCOUNTER — Ambulatory Visit: Payer: Medicare Other | Admitting: Physical Therapy

## 2016-03-19 DIAGNOSIS — M6281 Muscle weakness (generalized): Secondary | ICD-10-CM | POA: Diagnosis not present

## 2016-03-19 DIAGNOSIS — R2689 Other abnormalities of gait and mobility: Secondary | ICD-10-CM

## 2016-03-19 NOTE — Therapy (Signed)
[facility] [address] [place], [place], [address] Phone: [phone]   Fax:  [phone]  Physical Therapy Treatment  Patient Details  Name: Sean Wilkerson MRN: [id number] Date of Birth: [birth year] Referring Provider: Dr. Delia Heady  Encounter Date: 03/18/2016      PT End of Session - 03/19/16 0749    Visit Number 3   Number of Visits 17   Authorization Type [employer] Medicare   Authorization Time Period 03-05-16 - 05-04-16   PT Start Time 0932   PT Stop Time 1015   PT Time Calculation (min) 43 min   Equipment Utilized During Treatment Gait belt   Activity Tolerance Patient tolerated treatment well   Behavior During Therapy [facility] for tasks assessed/performed      Past Medical History  Diagnosis Date  . Hypertension   . HTN (hypertension) 10/08/2015  . HLD (hyperlipidemia) 10/08/2015  . Stroke [facility])     Past Surgical History  Procedure Laterality Date  . No past surgeries      There were no vitals filed for this visit.      Subjective Assessment - 03/18/16 0935    Subjective Been doing some bed exercises at home.   Patient is accompained by: Family member  wife, Sean Wilkerson   Pertinent History L CVA 10-05-15 - hospitalized until Jan - D/C'd home with Home Health PT, OT ST - Home health finished up on 02-25-16   Patient Stated Goals "I want to walk without anything"   Currently in Pain? No/denies                         [facility] Adult PT Treatment/Exercise - 03/18/16 1412    Transfers   Transfers Sit to Stand;Stand to Sit;Stand Pivot Transfers   Sit to Stand 4: Min guard;4: Min assist   Sit to Stand Details Tactile cues for sequencing;Tactile cues for placement;Tactile cues for weight beaing;Verbal cues for technique   Sit to Stand Details (indicate cue type and reason) Therapist provides assistance for foot placement and cues for anterior lean and equal weightbearing.  Therapist attempts to  block/prevent R knee recurvatum upon standing.   Stand to Sit 4: Min guard;4: Min assist  cues for hand placement   Stand Pivot Transfers 3: Mod assist   Stand Pivot Transfer Details (indicate cue type and reason) no device with therapist assist and cues for SPT   Number of Reps 10 reps   Comments Pt has decreased weightshift and decreased weightbearing on RLE with transfers and upon standing.   Ambulation/Gait   Ambulation/Gait Yes   Ambulation/Gait Assistance 4: Min assist   Ambulation/Gait Assistance Details without KAFO; therapist provides cues at R hip for improved initial swing phase and to attempt to block R knee recurvatum in stance phase of gait.   Ambulation Distance (Feet) 30 Feet  x 2   Assistive device Right platform walker   Gait Pattern Step-to pattern;Decreased hip/knee flexion - right;Decreased stance time - right;Decreased step length - right;Decreased dorsiflexion - right;Right genu recurvatum   Ambulation Surface Level;Indoor   Gait Comments Continues to have significant R knee recurvatum in stance phase of gait   High Level Balance   High Level Balance Comments Standing at counter, with lateral weightshifting x 10 reps, with tactile cues to prevent R knee recurvatum; standing minisquats at counter, x 8 reps with min assistance.   Knee/Hip Exercises: Seated   Heel Slides AAROM;Right;10 reps  using  sliding board and pillow case, therapist assist   Heel Slides Limitations needs assistance for proper positioning   Knee/Hip Exercises: Supine   Bridges 10 reps   Other Supine Knee/Hip Exercises hooklying R clamshell with caregiver assist x 10   Other Supine Knee/Hip Exercises hooklying marching alternating legs, 10 reps with therapist assistance for RLE; hooklying hip adduction squeezes x 10 reps       Ex:  Reviewed HEP exercises given last visit, with pt/wife verbalizing and demo understanding.  Pt continues to need assistance and cueing for control of RLE with  exercises.           PT Short Term Goals - 03/06/16 1912    PT SHORT TERM GOAL #1   Title Pt will perform basic transfers with S only - wheelchair to/from mat.  (04-05-16)   Baseline min to mod assist with stand pivot   Time 4   Status New   PT SHORT TERM GOAL #2   Title Stand for at least 3" with intermittent UE support at counter without KAFO on RLE with SBA for incr. independence with ADL's.  (04-05-16)   Time 4   Period Weeks   Status New   PT SHORT TERM GOAL #3   Title Amb. 150' with platform RW with CGA on flat, even surface.  (04-05-16)   Baseline 50' with RW   Time 4   Period Weeks   Status New   PT SHORT TERM GOAL #4   Title Perform bed mobility including sit to/from supine with S.  (04-05-16)   Baseline mod to min assist required per wife's report   Time 4   Period Weeks   Status New   PT SHORT TERM GOAL #5   Title Independent in HEP for RLE strengthening.  (04-05-16)   Time 4   Period Weeks   Status New           PT Long Term Goals - 03/06/16 1917    PT LONG TERM GOAL #1   Title Modified independent with basic transfers.  (05-04-16)   Time 8   Period Weeks   Status New   PT LONG TERM GOAL #2   Title Modified independent with bed mobility.  (05-04-16)   Time 8   Period Weeks   Status New   PT LONG TERM GOAL #3   Title Amb. 36' with hemiwalker with S for incr. household amb.  (05-04-16)   Time 8   Period Weeks   Status New   PT LONG TERM GOAL #4   Title Amb. 45' with hemiwalker with CGA for incr. community accessibility.  (05-04-16)   Time 8   Period Weeks   Status New   PT LONG TERM GOAL #5   Title Negotiate steps (4) with min assist with use of hand rail .  (05-04-16)   Time 8   Period Weeks   Status New   Additional Long Term Goals   Additional Long Term Goals Yes   PT LONG TERM GOAL #6   Title Perform Berg balance test when appropriate and establish goal as needed.  (05-04-16)   Time 8   Period Weeks   Status New                Plan - 03/19/16 1610    Clinical Impression Statement Reviewed HEP today, with pt/wife verbalize/demo understanding.  Pt continues to have significant R knee recurvatum in standing and with gait without KAFO (pt left in  car).  Recommended and requested pt bring in KAFO next visit.   Rehab Potential Good   PT Frequency 2x / week   PT Duration 8 weeks   PT Treatment/Interventions ADLs/Self Care Home Management;DME Instruction;Balance training;Therapeutic exercise;Therapeutic activities;Functional mobility training;Stair training;Gait training;Neuromuscular re-education;Patient/family education;Orthotic Fit/Training;Passive range of motion   PT Next Visit Plan gait training with platform RW; standing exercises; RLE strengthening; may try gait with AFO portion only of KAFO    PT Home Exercise Plan RLE strengthening   Consulted and Agree with Plan of Care Patient;Family member/caregiver   Family Member Consulted spouse Sean Wilkerson      Patient will benefit from skilled therapeutic intervention in order to improve the following deficits and impairments:  Abnormal gait, Decreased activity tolerance, Decreased balance, Decreased coordination, Decreased endurance, Decreased mobility, Decreased range of motion, Decreased strength, Impaired tone, Impaired UE functional use, Obesity  Visit Diagnosis: Muscle weakness (generalized)  Other abnormalities of gait and mobility     Problem List Patient Active Problem List   Diagnosis Date Noted  . Slow transit constipation   . Neuropathic pain   . Stroke, acute, thrombotic (HCC) 10/08/2015  . 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 Essential hypertension 10/08/2015  . HLD (hyperlipidemia) 10/08/2015  . Hemiplegia and hemiparesis following cerebral infarction affecting right dominant side (HCC)   . Right hemiplegia (HCC) 10/06/2015  . Stroke (cerebrum) (HCC) 10/05/2015  . CVA (cerebral  infarction) 10/05/2015    MARRIOTT,AMY W. 03/19/2016, 7:51 AM  Gean MaidensMARRIOTT,AMY W., PT  [facility] [address] [place], [place], [address] Phone: [phone]   Fax:  [phone]  Name: Sean Wilkerson MRN: [id number] Date of Birth: [birth year]

## 2016-03-20 NOTE — Therapy (Signed)
[facility] [address] [place], [place], [address] Phone: [phone]   Fax:  [phone]  Physical Therapy Treatment  Patient Details  Name: Sean Wilkerson MRN: [id number] Date of Birth: [birth year] Referring Provider: Dr. Delia Heady  Encounter Date: 03/19/2016      PT End of Session - 03/20/16 1247    Visit Number 4   Number of Visits 17   Date for PT Re-Evaluation 05/04/16   Authorization Type U[employer] Medicare   Authorization Time Period 03-05-16 - 05-04-16   PT Start Time 0932   PT Stop Time 1015   PT Time Calculation (min) 43 min   Equipment Utilized During Treatment Gait belt   Activity Tolerance Patient tolerated treatment well   Behavior During Therapy [facility] for tasks assessed/performed      Past Medical History  Diagnosis Date  . Hypertension   . HTN (hypertension) 10/08/2015  . HLD (hyperlipidemia) 10/08/2015  . Stroke [facility])     Past Surgical History  Procedure Laterality Date  . No past surgeries      There were no vitals filed for this visit.      Subjective Assessment - 03/19/16 0934    Subjective Brought in KAFO today.  "That was new stuff to my body yesterday."   Patient is accompained by: Family member   Patient Stated Goals "I want to walk without anything"   Currently in Pain? No/denies                         [facility] Adult PT Treatment/Exercise - 03/19/16 1009    Transfers   Transfers Sit to Stand;Stand to [employer]   Sit to Stand 4: Min guard;4: Min assist   Sit to Stand Details Tactile cues for sequencing;Tactile cues for placement;Tactile cues for weight beaing;Verbal cues for technique   Sit to Stand Details (indicate cue type and reason) Therapist provides assistance for RLE positioning and trying to prevent R knee recurvatum.  Upon standing, therapist provides cues for lateral weigthshifting towards R side, for improved equal weigthshifting and  weightbearing.   Stand to Sit 4: Min guard;4: Bank[employer] Transfers 3: Mod assist   Stand Pivot Transfer Details (indicate cue type and reason) no device wheelchair<>mat   Number of Reps 10 reps  sit<>stand from 20" mat surface   Comments Pt has decreased weightshift and decreased weightbearing on RLE with transfers and upon standing.   Ambulation/Gait   Ambulation/Gait Yes   Ambulation/Gait Assistance 3: Mod assist;4: Min assist   Ambulation/Gait Assistance Details trial of KAFO, then trial of PLS AFO with heelwedge in R shoe.  With KAFO, pt continues to have significant R knee recurvatum, decr. RLE weightshift and needs cues for upright posture and to keep RW closer to BOS.  With trial of PLS AFO with R heel wedge, pt has uncontrolled and even more recurvatum in R knee.   Ambulation Distance (Feet) 30 Feet  then 5 ft with trial of PLS AFO   Assistive device Right platform walker   Gait Pattern Step-to pattern;Decreased hip/knee flexion - right;Decreased stance time - right;Decreased step length - right;Decreased dorsiflexion - right;Right genu recurvatum;Decreased weight shift to right  forward flexed posture at walker   Ambulation Surface Level;Indoor   Gait Comments Gait trial today to see if KAFO could possibly be substituted with less cumbersome AFO/heelwedge, but recurvatum is even more significant, even to the point of  R knee instability/near buckling.  Will likely need to continue gait and standing activities with current KAFO-possible request for strap behind knee.   Knee/Hip Exercises: Seated   Heel Slides AAROM;Right;10 reps  using sliding board and pillowcase to lessen friction   Heel Slides Limitations needs assistance for proper positioning   Ball Squeeze 10 reps wtih therapist assist for RLE squeezes     Seated heel digs (incorporating cues for glut sets, quads/hamstring sets) x 10 reps.  Tactile cues provided to quads and hamstrings during seated  activities.  Pre gait:  Pt requires max assist/total assist for donning/doffing KAFO.   Self Care:  Discussed gait and standing activities and need for using current KAFO.  Trial of other AFO and heelwedge not successful and pt will need to bring in KAFO for PT sessions for standing and gait activities to optimize safety and functional mobility.  Pt and wife verbalize understanding.             PT Short Term Goals - 03/06/16 1912    PT SHORT TERM GOAL #1   Title Pt will perform basic transfers with S only - wheelchair to/from mat.  (04-05-16)   Baseline min to mod assist with stand pivot   Time 4   Status New   PT SHORT TERM GOAL #2   Title Stand for at least 3" with intermittent UE support at counter without KAFO on RLE with SBA for incr. independence with ADL's.  (04-05-16)   Time 4   Period Weeks   Status New   PT SHORT TERM GOAL #3   Title Amb. 150' with platform RW with CGA on flat, even surface.  (04-05-16)   Baseline 50' with RW   Time 4   Period Weeks   Status New   PT SHORT TERM GOAL #4   Title Perform bed mobility including sit to/from supine with S.  (04-05-16)   Baseline mod to min assist required per wife's report   Time 4   Period Weeks   Status New   PT SHORT TERM GOAL #5   Title Independent in HEP for RLE strengthening.  (04-05-16)   Time 4   Period Weeks   Status New           PT Long Term Goals - 03/06/16 1917    PT LONG TERM GOAL #1   Title Modified independent with basic transfers.  (05-04-16)   Time 8   Period Weeks   Status New   PT LONG TERM GOAL #2   Title Modified independent with bed mobility.  (05-04-16)   Time 8   Period Weeks   Status New   PT LONG TERM GOAL #3   Title Amb. 7' with hemiwalker with S for incr. household amb.  (05-04-16)   Time 8   Period Weeks   Status New   PT LONG TERM GOAL #4   Title Amb. 18' with hemiwalker with CGA for incr. community accessibility.  (05-04-16)   Time 8   Period Weeks   Status New   PT  LONG TERM GOAL #5   Title Negotiate steps (4) with min assist with use of hand rail .  (05-04-16)   Time 8   Period Weeks   Status New   Additional Long Term Goals   Additional Long Term Goals Yes   PT LONG TERM GOAL #6   Title Perform Berg balance test when appropriate and establish goal as needed.  (05-04-16)  Time 8   Period Weeks   Status New               Plan - 03/20/16 1258    Clinical Impression Statement Gait with KAFO today, as well as trial of PLS with heel wedge.  With those two choices, pt appears to have slightly less recurvatum and slightly imrpoved control of R knee with his KAFO.  Encouraged pt/wife to bring it in to sessions, as they said it was too cumbersome to put on/wear prior to sessions because of difficulty getting in and out of car.  May benefit from PT discussing with orthotist Kathlene November(Mike) regarding best fit and options for current KAFO to aid in stability and weigthshifting/pre-gait and gait activities.   Rehab Potential Good   PT Frequency 2x / week   PT Duration 8 weeks   PT Treatment/Interventions ADLs/Self Care Home Management;DME Instruction;Balance training;Therapeutic exercise;Therapeutic activities;Functional mobility training;Stair training;Gait training;Neuromuscular re-education;Patient/family education;Orthotic Fit/Training;Passive range of motion   PT Next Visit Plan standing weigthshifting and RLE weightbearing activities; gait with KAFO and platform RW, RLE strengthening and neuro-re-education.   PT Home Exercise Plan RLE strengthening   Consulted and Agree with Plan of Care Patient;Family member/caregiver   Family Member Consulted spouse [name]      Patient will benefit from skilled therapeutic intervention in order to improve the following deficits and impairments:  Abnormal gait, Decreased activity tolerance, Decreased balance, Decreased coordination, Decreased endurance, Decreased mobility, Decreased range of motion, Decreased strength,  Impaired tone, Impaired UE functional use, Obesity  Visit Diagnosis: Other abnormalities of gait and mobility  Muscle weakness (generalized)     Problem List Patient Active Problem List   Diagnosis Date Noted  . Slow transit constipation   . Neuropathic pain   . Stroke, acute, thrombotic (HCC) 10/08/2015  . 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 Essential hypertension 10/08/2015  . HLD (hyperlipidemia) 10/08/2015  . Hemiplegia and hemiparesis following cerebral infarction affecting right dominant side (HCC)   . Right hemiplegia (HCC) 10/06/2015  . Stroke (cerebrum) (HCC) 10/05/2015  . CVA (cerebral infarction) 10/05/2015    Latrica Clowers W. 03/20/2016, 1:05 PM  Gean MaidensMARRIOTT,Christerpher Clos W., PT  [facility] [address] [place], [place], [address] Phone: [phone]   Fax:  [phone]  Name: Adolphus BirchwoodLarry Lee Madera MRN: [id number] Date of Birth: 08/13/1946

## 2016-03-22 ENCOUNTER — Ambulatory Visit: Payer: Medicare Other | Admitting: Rehabilitation

## 2016-03-22 ENCOUNTER — Encounter: Payer: Self-pay | Admitting: Rehabilitation

## 2016-03-22 DIAGNOSIS — M6281 Muscle weakness (generalized): Secondary | ICD-10-CM

## 2016-03-22 DIAGNOSIS — R2681 Unsteadiness on feet: Secondary | ICD-10-CM

## 2016-03-22 DIAGNOSIS — R2689 Other abnormalities of gait and mobility: Secondary | ICD-10-CM

## 2016-03-22 DIAGNOSIS — I69353 Hemiplegia and hemiparesis following cerebral infarction affecting right non-dominant side: Secondary | ICD-10-CM

## 2016-03-22 NOTE — Therapy (Signed)
[facility] [address] [place], [place], [address] Phone: [phone]   Fax:  [phone]  Physical Therapy Treatment  Patient Details  Name: Sean Wilkerson MRN: [id number] Date of Birth: [birth year] Referring Provider: Dr. Delia Heady  Encounter Date: 03/22/2016      PT End of Session - 03/22/16 1154    Visit Number 5   Number of Visits 17   Date for PT Re-Evaluation 05/04/16   Authorization Type UHC Medicare   Authorization Time Period 03-05-16 - 05-04-16   PT Start Time 1145   PT Stop Time 1232   PT Time Calculation (min) 47 min   Equipment Utilized During Treatment Gait belt   Activity Tolerance Patient tolerated treatment well   Behavior During Therapy [facility] for tasks assessed/performed      Past Medical History  Diagnosis Date  . Hypertension   . HTN (hypertension) 10/08/2015  . HLD (hyperlipidemia) 10/08/2015  . Stroke [facility])     Past Surgical History  Procedure Laterality Date  . No past surgeries      There were no vitals filed for this visit.      Subjective Assessment - 03/22/16 1152    Subjective "I'm ready to walk."    Patient is accompained by: Family member   Pertinent History L CVA 10-05-15 - hospitalized until Jan - D/C'd home with Home Health PT, OT ST - Home health finished up on 02-25-16   Patient Stated Goals "I want to walk without anything"   Currently in Pain? No/denies                         [facility] Adult PT Treatment/Exercise - 03/22/16 1200    Transfers   Transfers Sit to Stand;Stand to [employer] Transfers   Sit to Stand 4: Min guard;4: Min assist   Sit to Stand Details Tactile cues for sequencing;Tactile cues for placement;Tactile cues for weight beaing;Verbal cues for technique   Sit to Stand Details (indicate cue type and reason) Cues for safety, hand placement and increased WB on RLE   Stand to Sit 4: Min guard;4: Min assist   Stand to Sit  Details (indicate cue type and reason) Tactile cues for weight shifting;Tactile cues for posture;Tactile cues for weight beaing;Verbal cues for technique;Verbal cues for sequencing;Verbal cues for gait pattern;Manual facilitation for weight shifting;Manual facilitation for weight bearing   Stand Pivot Transfers 3: Mod assist   Stand Pivot Transfer Details (indicate cue type and reason) Performed stand pivot without device in order to increase WB on RLE.  Mod A with facilitation for R weight shift and cues for stepping sequence and safety of RLE.    Comments Performed standing without brace to better assist recurvatum and note marked decreased weight shift to the R, decreased R glute activation and R knee in extension, however no hyperextension in standing.     Ambulation/Gait   Ambulation/Gait Yes   Ambulation/Gait Assistance 3: Mod assist;1: +2 Total assist  +2 for safety and facilitation (without device)   Ambulation/Gait Assistance Details Performed gait x 2 reps during session without AD with blue rocker AFO, air cast for increased ankle stability and ace wrap to control recurvatum during stance.  Pt requires some assist for RLE placement as he tends to have narrow BOS, assist to facilitate upright posture and increased R glute activation as well as cues for posture and increased weight shift to the R, increased time spent on  RLE and slower step with LLE.  Note marked improvement with knee control with ace wrap and continue to feel that he would benefit from another consult with [employer].     Ambulation Distance (Feet) 30 Feet  and another 7545'   Assistive device None;2 person hand held assist   Gait Pattern Step-to pattern;Decreased hip/knee flexion - right;Decreased stance time - right;Decreased step length - right;Decreased dorsiflexion - right;Right genu recurvatum;Decreased weight shift to right                PT Education - 03/22/16 1454    Education provided Yes   Education Details  education on performing sit<>stand as often as possible at home in front of mirror to increase midline orientation/equal weight bearing.   Person(s) Educated Patient;Spouse   Methods Explanation   Comprehension Verbalized understanding          PT Short Term Goals - 03/06/16 1912    PT SHORT TERM GOAL #1   Title Pt will perform basic transfers with S only - wheelchair to/from mat.  (04-05-16)   Baseline min to mod assist with stand pivot   Time 4   Status New   PT SHORT TERM GOAL #2   Title Stand for at least 3" with intermittent UE support at counter without KAFO on RLE with SBA for incr. independence with ADL's.  (04-05-16)   Time 4   Period Weeks   Status New   PT SHORT TERM GOAL #3   Title Amb. 150' with platform RW with CGA on flat, even surface.  (04-05-16)   Baseline 50' with RW   Time 4   Period Weeks   Status New   PT SHORT TERM GOAL #4   Title Perform bed mobility including sit to/from supine with S.  (04-05-16)   Baseline mod to min assist required per wife's report   Time 4   Period Weeks   Status New   PT SHORT TERM GOAL #5   Title Independent in HEP for RLE strengthening.  (04-05-16)   Time 4   Period Weeks   Status New           PT Long Term Goals - 03/06/16 1917    PT LONG TERM GOAL #1   Title Modified independent with basic transfers.  (05-04-16)   Time 8   Period Weeks   Status New   PT LONG TERM GOAL #2   Title Modified independent with bed mobility.  (05-04-16)   Time 8   Period Weeks   Status New   PT LONG TERM GOAL #3   Title Amb. 3150' with hemiwalker with S for incr. household amb.  (05-04-16)   Time 8   Period Weeks   Status New   PT LONG TERM GOAL #4   Title Amb. 65240' with hemiwalker with CGA for incr. community accessibility.  (05-04-16)   Time 8   Period Weeks   Status New   PT LONG TERM GOAL #5   Title Negotiate steps (4) with min assist with use of hand rail .  (05-04-16)   Time 8   Period Weeks   Status New   Additional Long Term  Goals   Additional Long Term Goals Yes   PT LONG TERM GOAL #6   Title Perform Berg balance test when appropriate and establish goal as needed.  (05-04-16)   Time 8   Period Weeks   Status New  Plan - 03/22/16 1456    Clinical Impression Statement Skilled session continued to focus on gait (without AD) with blue rocker AFO, air cast (for medial/lateral support at ankle) and ace wrap to reduce recurvatum at knee.  Continue to feel that pt would benefit from new consult with orthotist to better determine most appropriate brace for compliance and energy conservation.     Rehab Potential Good   PT Frequency 2x / week   PT Duration 8 weeks   PT Treatment/Interventions ADLs/Self Care Home Management;DME Instruction;Balance training;Therapeutic exercise;Therapeutic activities;Functional mobility training;Stair training;Gait training;Neuromuscular re-education;Patient/family education;Orthotic Fit/Training;Passive range of motion   PT Next Visit Plan standing weigthshifting and RLE weightbearing activities; gait with KAFO and platform RW (can he does this with wife) transfers without brace and walker from w/c to/from shower chair with wife, RLE strengthening and neuro-re-education.   PT Home Exercise Plan RLE strengthening   Consulted and Agree with Plan of Care Patient;Family member/caregiver   Family Member Consulted spouse [name]      Patient will benefit from skilled therapeutic intervention in order to improve the following deficits and impairments:  Abnormal gait, Decreased activity tolerance, Decreased balance, Decreased coordination, Decreased endurance, Decreased mobility, Decreased range of motion, Decreased strength, Impaired tone, Impaired UE functional use, Obesity  Visit Diagnosis: Other abnormalities of gait and mobility  Muscle weakness (generalized)  Hemiplegia and hemiparesis following cerebral infarction affecting right non-dominant side (HCC)  Unsteadiness  on feet     Problem List Patient Active Problem List   Diagnosis Date Noted  . Slow transit constipation   . Neuropathic pain   . Stroke, acute, thrombotic (HCC) 10/08/2015  . 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 Essential hypertension 10/08/2015  . HLD (hyperlipidemia) 10/08/2015  . Hemiplegia and hemiparesis following cerebral infarction affecting right dominant side (HCC)   . Right hemiplegia (HCC) 10/06/2015  . Stroke (cerebrum) (HCC) 10/05/2015  . CVA (cerebral infarction) 10/05/2015    Harriet Butte, PT, MPT [facility] [address] [place], [place], [address] Phone: [phone]   Fax:  [phone] 03/22/2016, 3:08 PM  Name: Sean Wilkerson MRN: [id number] Date of Birth: Oct 04, 1946

## 2016-03-26 ENCOUNTER — Ambulatory Visit: Payer: Medicare Other

## 2016-03-26 ENCOUNTER — Ambulatory Visit: Payer: Medicare Other | Admitting: Physical Therapy

## 2016-03-26 ENCOUNTER — Ambulatory Visit: Payer: Medicare Other | Admitting: Occupational Therapy

## 2016-03-26 ENCOUNTER — Encounter: Payer: Self-pay | Admitting: Physical Therapy

## 2016-03-26 DIAGNOSIS — I69353 Hemiplegia and hemiparesis following cerebral infarction affecting right non-dominant side: Secondary | ICD-10-CM

## 2016-03-26 DIAGNOSIS — R471 Dysarthria and anarthria: Secondary | ICD-10-CM

## 2016-03-26 DIAGNOSIS — R2689 Other abnormalities of gait and mobility: Secondary | ICD-10-CM

## 2016-03-26 DIAGNOSIS — R278 Other lack of coordination: Secondary | ICD-10-CM

## 2016-03-26 DIAGNOSIS — M6281 Muscle weakness (generalized): Secondary | ICD-10-CM

## 2016-03-26 DIAGNOSIS — R2681 Unsteadiness on feet: Secondary | ICD-10-CM

## 2016-03-26 NOTE — Patient Instructions (Signed)
   EXERCISES FOR LIPS  2 sets of 15, twice a day   KISSING EXERCISE - loud kissing noise from the FRONT of your lips   Say "oooooooo----eeeeee" with lips way forward and way back   Puff out your cheeks for 3 seconds - NO AIR ESCAPE!

## 2016-03-26 NOTE — Therapy (Signed)
[facility] [address] [place], [place], [address] Phone: [phone]   Fax:  [phone]  Speech Language Pathology Treatment  Patient Details  Name: Sean Wilkerson MRN: [id number] Date of Birth: 1946/06/07 Referring Provider: Delia Heady  Encounter Date: 03/26/2016      End of Session - 03/26/16 1154    Visit Number 4   Number of Visits 17   Date for SLP Re-Evaluation 05/14/16   SLP Start Time 1102   SLP Stop Time  1145   SLP Time Calculation (min) 43 min   Activity Tolerance Patient tolerated treatment well      Past Medical History  Diagnosis Date  . Hypertension   . HTN (hypertension) 10/08/2015  . HLD (hyperlipidemia) 10/08/2015  . Stroke [facility])     Past Surgical History  Procedure Laterality Date  . No past surgeries      There were no vitals filed for this visit.      Subjective Assessment - 03/26/16 1107    Subjective Mod hoarseness continues.   Currently in Pain? No/denies               ADULT SLP TREATMENT - 03/26/16 1118    General Information   Behavior/Cognition Alert;Cooperative;Pleasant mood   Treatment Provided   Treatment provided Cognitive-Linquistic   Cognitive-Linquistic Treatment   Treatment focused on Dysarthria   Skilled Treatment Min--mod cues usually needed for full breath cont as necessary, and 5 second break between reps. Pt with consistent cheek puffing for trials after the first set. 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   Assessment / Recommendations / Plan   Plan Continue with current plan of care          SLP Education - 03/26/16 1153    Education provided Yes   Education Details updated HEP for oral-motor    Person(s) Educated Patient   Methods Explanation;Demonstration;Verbal cues   Comprehension  Verbalized understanding;Verbal cues required          SLP Short Term Goals - 03/26/16 1125    SLP SHORT TERM GOAL #1   Title pt will demo 23/25 reps of EMST with proper procedure over 4 sessions   Time 2   Period Weeks   Status On-going   SLP SHORT TERM GOAL #2   Title pt will demo oral-motor HEP with rare min A    Time 2   Period Weeks   Status On-going   SLP SHORT TERM GOAL #3   Title pt will demo 70dB average volume in 5 minutes simple conversation over three sessions   Time 2   Period Weeks   Status On-going          SLP Long Term Goals - 03/26/16 1155    SLP LONG TERM GOAL #1   Title pt will demo 25/25 reps of EMST with appropriate procedure over 4 sessions   Time 6   Period Weeks   Status On-going   SLP LONG TERM GOAL #2   Title pt will demo oral motor HEP with SBA over three sessions   Time 6   Period Weeks   Status On-going   SLP LONG TERM GOAL #3   Title pt will produce 10 minutes of simple to mod complex conversation with average 70dB with rare min A over three sessions   Time  6   Period Weeks   Status On-going          Plan - 03/26/16 1154    Clinical Impression Statement Pt presents with mod dysarthria continues with hoarse voice.  Pt would cont to benefit from skilled ST addressing der'd intelligibility due to reduced breath support and reduced articulatory precision.   Speech Therapy Frequency 2x / week   Duration --  6 weeks   Treatment/Interventions Oral motor exercises;Compensatory strategies;SLP instruction and feedback;Patient/family education;Functional tasks;Cueing hierarchy   Potential to Achieve Goals Good   Potential Considerations Severity of impairments      Patient will benefit from skilled therapeutic intervention in order to improve the following deficits and impairments:   Dysarthria and anarthria    Problem List Patient Active Problem List   Diagnosis Date Noted  . Slow transit constipation   . Neuropathic pain   .  Stroke, acute, thrombotic (HCC) 10/08/2015  . 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 Essential hypertension 10/08/2015  . HLD (hyperlipidemia) 10/08/2015  . Hemiplegia and hemiparesis following cerebral infarction affecting right dominant side (HCC)   . Right hemiplegia (HCC) 10/06/2015  . Stroke (cerebrum) (HCC) 10/05/2015  . CVA (cerebral infarction) 10/05/2015    [facility] Regional Medical CenterCHINKE,CARL ,MS, CCC-SLP   03/26/2016, 11:56 AM  [facility] [address] [place], [place], [address] Phone: [phone]   Fax:  [phone]   Name: Sean Wilkerson MRN: [id number] Date of Birth: May 07, 1946

## 2016-03-26 NOTE — Therapy (Signed)
[facility] [address] [place], [place], [address] Phone: [phone]   Fax:  [phone]  Occupational Therapy Treatment  Patient Details  Name: Sean Wilkerson MRN: [id number] Date of Birth: Oct 29, 1945 Referring Provider: Dr. Pearlean Brownie  Encounter Date: 03/26/2016      OT End of Session - 03/26/16 1341    Visit Number 6   Number of Visits 17   Date for OT Re-Evaluation 05/02/16   Authorization Type UMR MCR   Authorization - Visit Number 6   Authorization - Number of Visits 10   OT Start Time 1230   OT Stop Time 1315   OT Time Calculation (min) 45 min   Activity Tolerance Patient tolerated treatment well   Behavior During Therapy [facility] for tasks assessed/performed      Past Medical History  Diagnosis Date  . Hypertension   . HTN (hypertension) 10/08/2015  . HLD (hyperlipidemia) 10/08/2015  . Stroke [facility])     Past Surgical History  Procedure Laterality Date  . No past surgeries      There were no vitals filed for this visit.      Subjective Assessment - 03/26/16 1332    Subjective  [name] indicates that he is just wearing edema glove at night - swelling almost completely resolved in right hand.     Patient is accompained by: Family member   Patient Stated Goals to be more indpendent   Currently in Pain? No/denies   Pain Score 0-No pain                      OT Treatments/Exercises (OP) - 03/26/16 0001    Neurological Re-education Exercises   Other Exercises 1 Neuromuscular reeducation to address postural control and activation of scapular stabilizers.  Weight bearing through long arm in sitting, sidelying and in quadruped.  Patient with wrist pain in quadruped, unable to sustain with adequate support.  In sidelying and in sidesitting able to activate trunk and scapular swtabilizers.  Immediately followed with attempts at active movement in right arm.  Patient needs weight biased  toward right, and trunk in active extension in order to activate shoulder movement.  Today patient with beginning sholdler flexion, extension, adduction.  Patient also with trace forearm pronation, and trace movement in thumb .                  OT Education - 03/26/16 1341    Education provided Yes   Education Details Benefits of weight bearing on right UE, shoulder subluxation, potential need for wrist splint.     Person(s) Educated Patient;Spouse   Methods Explanation;Demonstration   Comprehension Verbal cues required;Tactile cues required;Need further instruction          OT Short Term Goals - 03/17/16 1523    OT SHORT TERM GOAL #1   Title I with inital HEP.- check 04/03/16   Status Achieved   OT SHORT TERM GOAL #2   Title Pt will demonstrate understanding of RUE positioning to minimize pain and risk for injury including splint wear PRN.   Status On-going   OT SHORT TERM GOAL #3   Title Pt will consistently donn shirt with min A.   Status On-going   OT SHORT TERM GOAL #4   Title Pt will perform toilet transfers with min A.   Status On-going   OT SHORT TERM GOAL #5   Title Pt will donn pants with min A using AE PRN.  Status On-going           OT Long Term Goals - 03/09/16 1724    OT LONG TERM GOAL #1   Title Pt will donn shirt with set up only.- check 05/01/16   Time 8   Period Weeks   Status New   OT LONG TERM GOAL #2   Title Pt will donn pants with minguard for balance.   Time 8   Period Weeks   Status New   OT LONG TERM GOAL #3   Title Pt will donn socks and shoes with min A.   Time 8   Period Weeks   Status New   OT LONG TERM GOAL #4   Title Pt will use RUE as a stabilizer for ADLS at least 25 % of the time with pain less than or equal to 3/10.   Time 8   Period Weeks   Status New   OT LONG TERM GOAL #5   Title Pt will demonstrate P/ROM shoulder flexion to 100* with pain no greater than 3/10.   Time 8   Period Weeks   Status New   OT LONG  TERM GOAL #6   Title Pt will demonstrate ability to stand at countertop and retrieve an item from midlevel shelf with LUE, with supervision and no LOB.   Time 8   Period Weeks   Status New               Plan - 03/26/16 1343    Clinical Impression Statement Patient with dense right hemiplegia although beginning to elicit slight arm movement. Overall postural control and activity tolerance slowly improving.     Clinical Impairments Affecting Rehab Potential Potential for depression- will monitor and offer support, resources as warranted   OT Frequency 2x / week   OT Duration 8 weeks   OT Treatment/Interventions Self-care/ADL training;Therapeutic exercise;Patient/family education;Balance training;Splinting;Neuromuscular education;Ultrasound;Energy conservation;Therapeutic exercises;Therapeutic activities;DME and/or AE instruction;Parrafin;Cryotherapy;Electrical Stimulation;Fluidtherapy;Cognitive remediation/compensation;Visual/perceptual remediation/compensation;Passive range of motion;Contrast Bath;Moist Heat   Plan Dynamic sitting and atanding to obtain RUE activation, functional mobility (patient really wants bathroom transfers to be more independent)   Consulted and Agree with Plan of Care Patient;Family member/caregiver   Family Member Consulted wife       Patient will benefit from skilled therapeutic intervention in order to improve the following deficits and impairments:  Decreased coordination, Decreased range of motion, Increased edema, Decreased safety awareness, Decreased endurance, Decreased activity tolerance, Decreased knowledge of precautions, Impaired tone, Obesity, Pain, Impaired UE functional use, Decreased knowledge of use of DME, Decreased balance, Decreased cognition, Decreased mobility, Decreased strength, Impaired perceived functional ability, Impaired vision/preception  Visit Diagnosis: Muscle weakness (generalized)  Hemiplegia and hemiparesis following cerebral  infarction affecting right non-dominant side (HCC)  Unsteadiness on feet  Other lack of coordination    Problem List Patient Active Problem List   Diagnosis Date Noted  . Slow transit constipation   . Neuropathic pain   . Stroke, acute, thrombotic (HCC) 10/08/2015  . 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 Essential hypertension 10/08/2015  . HLD (hyperlipidemia) 10/08/2015  . Hemiplegia and hemiparesis following cerebral infarction affecting right dominant side (HCC)   . Right hemiplegia (HCC) 10/06/2015  . Stroke (cerebrum) (HCC) 10/05/2015  . CVA (cerebral infarction) 10/05/2015    Collier Salina, OTR/L 03/26/2016, 1:46 PM  [facility] [address] [place], [place], [address] Phone: [phone]   Fax:  [phone]  Name: Sean Wilkerson MRN: [id number] Date of Birth: Jun 30, 1946

## 2016-03-29 NOTE — Therapy (Signed)
[facility] [address] [place], [place], [address] Phone: [phone]   Fax:  [phone]  Physical Therapy Treatment  Patient Details  Name: Sean BirchwoodLarry Lee Vanhorne MRN: [id number] Date of Birth: 12-Feb-1946 Referring Provider: Dr. Delia HeadyPramod Sethi  Encounter Date: 03/26/2016     03/26/16 1150  PT Visits / Re-Eval  Visit Number 6  Number of Visits 17  Date for PT Re-Evaluation 05/04/16  Authorization  Authorization Type UHC Medicare  Authorization Time Period 03-05-16 - 05-04-16  PT Time Calculation  PT Start Time 1146  PT Stop Time 1230  PT Time Calculation (min) 44 min  PT - End of Session  Equipment Utilized During Treatment Gait belt  Activity Tolerance Patient tolerated treatment well  Behavior [facility] for tasks assessed/performed    Past Medical History  Diagnosis Date  . Hypertension   . HTN (hypertension) 10/08/2015  . HLD (hyperlipidemia) 10/08/2015  . Stroke [facility](HCC)     Past Surgical History  Procedure Laterality Date  . No past surgeries      There were no vitals filed for this visit.     03/26/16 1149  Symptoms/Limitations  Subjective No new complaints. No falls. No pain currently.  Patient is accompained by: Family member  Pertinent History L CVA 10-05-15 - hospitalized until Jan - D/C'd home with Home Health PT, OT ST - Home health finished up on 02-25-16  Patient Stated Goals "I want to walk without anything"  Pain Assessment  Currently in Pain? No/denies  Pain Score 0   Treatment: Session addressed gait safety and quality.  Started with brace options. With KAFO and heel wedge: knee recurvatum present, needing cues/faciliaiton to decrease this and on step length/posure with gait. With heel lift removed pt demo'd decreased speed of recurvatum, able to ambulate with min guard assist. Spouse and pt given okay to ambulate at home short distances with KAFO and PFRW.  Trialed theraband  assisted DR with use of it behind knee to decrease GR and attached at gait belt to assist with hip/knee flexion. No control over GR noted with this after ~20-30 feet.   Used blue rocker with heel wedge with PFRW, 2 person assist to facilitate posture, step length, step placement and knee control. Changed to bil HHA of 2 people with cues and facilitation as stated. Ended session with PTA in front of pt with hands on her shoulders, PT and OT on sides of pt facilitating weight shifting, step length, posture to decrease trunk rotation and for knee control. Minimal assist of 1-2 people for balance with all gait.          PT Short Term Goals - 03/06/16 1912    PT SHORT TERM GOAL #1   Title Pt will perform basic transfers with S only - wheelchair to/from mat.  (04-05-16)   Baseline min to mod assist with stand pivot   Time 4   Status New   PT SHORT TERM GOAL #2   Title Stand for at least 3" with intermittent UE support at counter without KAFO on RLE with SBA for incr. independence with ADL's.  (04-05-16)   Time 4   Period Weeks   Status New   PT SHORT TERM GOAL #3   Title Amb. 150' with platform RW with CGA on flat, even surface.  (04-05-16)   Baseline 50' with RW   Time 4   Period Weeks   Status New   PT SHORT TERM GOAL #4  Title Perform bed mobility including sit to/from supine with S.  (04-05-16)   Baseline mod to min assist required per wife's report   Time 4   Period Weeks   Status New   PT SHORT TERM GOAL #5   Title Independent in HEP for RLE strengthening.  (04-05-16)   Time 4   Period Weeks   Status New           PT Long Term Goals - 03/06/16 1917    PT LONG TERM GOAL #1   Title Modified independent with basic transfers.  (05-04-16)   Time 8   Period Weeks   Status New   PT LONG TERM GOAL #2   Title Modified independent with bed mobility.  (05-04-16)   Time 8   Period Weeks   Status New   PT LONG TERM GOAL #3   Title Amb. 71' with hemiwalker with S for incr.  household amb.  (05-04-16)   Time 8   Period Weeks   Status New   PT LONG TERM GOAL #4   Title Amb. 1' with hemiwalker with CGA for incr. community accessibility.  (05-04-16)   Time 8   Period Weeks   Status New   PT LONG TERM GOAL #5   Title Negotiate steps (4) with min assist with use of hand rail .  (05-04-16)   Time 8   Period Weeks   Status New   Additional Long Term Goals   Additional Long Term Goals Yes   PT LONG TERM GOAL #6   Title Perform Berg balance test when appropriate and establish goal as needed.  (05-04-16)   Time 8   Period Weeks   Status New        03/26/16 1150  Plan  Clinical Impression Statement Today's session focused on gait: trialing mulitiple devices with and without AD. Pt with best limb clearance using bluerocker and heel lift. Call has been placed to orthotist to set up consult in session to assess device choices and determine best option for pt.   Pt will benefit from skilled therapeutic intervention in order to improve on the following deficits Abnormal gait;Decreased activity tolerance;Decreased balance;Decreased coordination;Decreased endurance;Decreased mobility;Decreased range of motion;Decreased strength;Impaired tone;Impaired UE functional use;Obesity  Rehab Potential Good  PT Frequency 2x / week  PT Duration 8 weeks  PT Treatment/Interventions ADLs/Self Care Home Management;DME Instruction;Balance training;Therapeutic exercise;Therapeutic activities;Functional mobility training;Stair training;Gait training;Neuromuscular re-education;Patient/family education;Orthotic Fit/Training;Passive range of motion  PT Next Visit Plan standing weigthshifting and RLE weightbearing activities; RLE strengthening and neuro-re-education.  PT Home Exercise Plan RLE strengthening  Consulted and Agree with Plan of Care Patient;Family member/caregiver  Family Member Consulted spouse [name]     Patient will benefit from skilled therapeutic intervention in order to  improve the following deficits and impairments:  Abnormal gait, Decreased activity tolerance, Decreased balance, Decreased coordination, Decreased endurance, Decreased mobility, Decreased range of motion, Decreased strength, Impaired tone, Impaired UE functional use, Obesity  Visit Diagnosis: Other abnormalities of gait and mobility  Muscle weakness (generalized)  Hemiplegia and hemiparesis following cerebral infarction affecting right non-dominant side (HCC)  Other lack of coordination     Problem List Patient Active Problem List   Diagnosis Date Noted  . Slow transit constipation   . Neuropathic pain   . Stroke, acute, thrombotic (HCC) 10/08/2015  . 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 Essential hypertension 10/08/2015  . HLD (hyperlipidemia) 10/08/2015  . Hemiplegia and  hemiparesis following cerebral infarction affecting right dominant side (HCC)   . Right hemiplegia (HCC) 10/06/2015  . Stroke (cerebrum) (HCC) 10/05/2015  . CVA (cerebral infarction) 10/05/2015    Sallyanne Kuster, PTA, [facility] [address] [place], [place] [address] [phone] 03/29/2016, 3:38 PM   Name: Marl Seago MRN: [id number] Date of Birth: 1946-10-14

## 2016-03-31 ENCOUNTER — Ambulatory Visit: Payer: Medicare Other | Admitting: Occupational Therapy

## 2016-03-31 ENCOUNTER — Encounter: Payer: Self-pay | Admitting: Occupational Therapy

## 2016-03-31 ENCOUNTER — Ambulatory Visit: Payer: Medicare Other

## 2016-03-31 ENCOUNTER — Ambulatory Visit: Payer: Medicare Other | Admitting: Physical Therapy

## 2016-03-31 DIAGNOSIS — R278 Other lack of coordination: Secondary | ICD-10-CM

## 2016-03-31 DIAGNOSIS — M6281 Muscle weakness (generalized): Secondary | ICD-10-CM | POA: Diagnosis not present

## 2016-03-31 DIAGNOSIS — I69353 Hemiplegia and hemiparesis following cerebral infarction affecting right non-dominant side: Secondary | ICD-10-CM

## 2016-03-31 DIAGNOSIS — R2689 Other abnormalities of gait and mobility: Secondary | ICD-10-CM

## 2016-03-31 DIAGNOSIS — R2681 Unsteadiness on feet: Secondary | ICD-10-CM

## 2016-03-31 DIAGNOSIS — R471 Dysarthria and anarthria: Secondary | ICD-10-CM

## 2016-03-31 NOTE — Patient Instructions (Addendum)
Lip Exercise (to add) - press lips together hard, then draw them out (smile) while keeping them pressed  15x, twice a day   ================================================================== Stroke Support Group [facility]  This support group for stroke survivors, their families and friends meets on the second Thursday of each month from 3 to 4 p.m. (September-May) at [employer] [place]. [facility], Department [facility], Rehabilitation Center Dayroom.  For more information, call Callie FieldingKatie Pittman at [phone] between 8 a.m. and 5 p.m. Monday through Thursday.    [facility] [address] [place]  Provides support and education to stroke survivors and their families.  When: The fourth Tuesday of each month from noon until 1:30 p.m., March through December.  Info: Reservations are appreciated but not required. For reservations or more information, call [phone].

## 2016-03-31 NOTE — Therapy (Signed)
[facility] [address] [place], [place], [address] Phone: [phone]   Fax:  [phone]  Occupational Therapy Treatment  Patient Details  Name: Sean Wilkerson MRN: [id number] Date of Birth: 1946-05-15 Referring Provider: Dr. Pearlean Brownie  Encounter Date: 03/31/2016      OT End of Session - 03/31/16 1730    Visit Number 7   Number of Visits 17   Date for OT Re-Evaluation 05/02/16   Authorization Type UMR MCR   Authorization - Visit Number 7   Authorization - Number of Visits 10   OT Start Time 1447   OT Stop Time 1532   OT Time Calculation (min) 45 min   Activity Tolerance Patient tolerated treatment well   Behavior During Therapy [facility] for tasks assessed/performed      Past Medical History  Diagnosis Date  . Hypertension   . HTN (hypertension) 10/08/2015  . HLD (hyperlipidemia) 10/08/2015  . Stroke [facility])     Past Surgical History  Procedure Laterality Date  . No past surgeries      There were no vitals filed for this visit.      Subjective Assessment - 03/31/16 1721    Subjective  Patient smiling when PT indicated he walked really well in therapy session today.  Patient desires greater independence.    Patient Stated Goals to be more indpendent   Currently in Pain? No/denies   Pain Score 0-No pain                      OT Treatments/Exercises (OP) - 03/31/16 0001    Neurological Re-education Exercises   Other Exercises 1 Neuromuscular reeducation to address postural control and forced use concepts to improve muscle activation throughout right side. Patient needed moderate facilitation to accept weight equally through bilateral LE's for sit to stand, however, once standing able to sustain weight on right LE and even begin to control knee.  Patient with improving shoulder activation into elevation, flexion, adduction, and extension - with mod facilitation.  In standign with UE on mobile  surface and weight through right leg, patient able to activate early stages of flexion for pre-reach pattern.      Programme researcher, broadcasting/film/video Location right forearm   Electrical Stimulation Action disit extension / flexion   Electrical Stimulation Parameters H200 - FUnctional estim x 15 min - trimmer 9 flex/ext.                  OT Education - 03/31/16 1729    Education provided Yes   Education Details importance of visual attention to right UE, importance of activation throughout right side, functional estim   Person(s) Educated Patient   Methods Explanation;Demonstration;Verbal cues   Comprehension Need further instruction          OT Short Term Goals - 03/17/16 1523    OT SHORT TERM GOAL #1   Title I with inital HEP.- check 04/03/16   Status Achieved   OT SHORT TERM GOAL #2   Title Pt will demonstrate understanding of RUE positioning to minimize pain and risk for injury including splint wear PRN.   Status On-going   OT SHORT TERM GOAL #3   Title Pt will consistently donn shirt with min A.   Status On-going   OT SHORT TERM GOAL #4   Title Pt will perform toilet transfers with min A.   Status On-going   OT SHORT TERM GOAL #5  Title Pt will donn pants with min A using AE PRN.   Status On-going           OT Long Term Goals - 03/09/16 1724    OT LONG TERM GOAL #1   Title Pt will donn shirt with set up only.- check 05/01/16   Time 8   Period Weeks   Status New   OT LONG TERM GOAL #2   Title Pt will donn pants with minguard for balance.   Time 8   Period Weeks   Status New   OT LONG TERM GOAL #3   Title Pt will donn socks and shoes with min A.   Time 8   Period Weeks   Status New   OT LONG TERM GOAL #4   Title Pt will use RUE as a stabilizer for ADLS at least 25 % of the time with pain less than or equal to 3/10.   Time 8   Period Weeks   Status New   OT LONG TERM GOAL #5   Title Pt will demonstrate P/ROM shoulder flexion to 100*  with pain no greater than 3/10.   Time 8   Period Weeks   Status New   OT LONG TERM GOAL #6   Title Pt will demonstrate ability to stand at countertop and retrieve an item from midlevel shelf with LUE, with supervision and no LOB.   Time 8   Period Weeks   Status New               Plan - 03/31/16 1730    Clinical Impression Statement Patient with dense right hemiplegia, although beginning to elicit slight arm movement with facilitation.  Patient reports shoulder pain has resolved, and edema in right hand has significantly improved.     Rehab Potential Good   Clinical Impairments Affecting Rehab Potential Potential for depression- will monitor and offer support, resources as warranted   OT Frequency 2x / week   OT Duration 8 weeks   OT Treatment/Interventions Self-care/ADL training;Therapeutic exercise;Patient/family education;Balance training;Splinting;Neuromuscular education;Ultrasound;Energy conservation;Therapeutic exercises;Therapeutic activities;DME and/or AE instruction;Parrafin;Cryotherapy;Electrical Stimulation;Fluidtherapy;Cognitive remediation/compensation;Visual/perceptual remediation/compensation;Passive range of motion;Contrast Bath;Moist Heat   Plan Dynamic sitting and standing to elicit arm motion - forced use, functional mobility - transfer training (really desires increased independence with bathroom transfers)   Consulted and Agree with Plan of Care Patient      Patient will benefit from skilled therapeutic intervention in order to improve the following deficits and impairments:  Decreased coordination, Decreased range of motion, Increased edema, Decreased safety awareness, Decreased endurance, Decreased activity tolerance, Decreased knowledge of precautions, Impaired tone, Obesity, Pain, Impaired UE functional use, Decreased knowledge of use of DME, Decreased balance, Decreased cognition, Decreased mobility, Decreased strength, Impaired perceived functional ability,  Impaired vision/preception  Visit Diagnosis: Muscle weakness (generalized)  Hemiplegia and hemiparesis following cerebral infarction affecting right non-dominant side (HCC)  Other lack of coordination  Unsteadiness on feet    Problem List Patient Active Problem List   Diagnosis Date Noted  . Slow transit constipation   . Neuropathic pain   . Stroke, acute, thrombotic (HCC) 10/08/2015  . 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 Essential hypertension 10/08/2015  . HLD (hyperlipidemia) 10/08/2015  . Hemiplegia and hemiparesis following cerebral infarction affecting right dominant side (HCC)   . Right hemiplegia (HCC) 10/06/2015  . Stroke (cerebrum) (HCC) 10/05/2015  . CVA (cerebral infarction) 10/05/2015    Collier SalinaGellert, Kristin M, OTR/L 03/31/2016, 5:34  PM  [facility] [address] [place], [place], [address] Phone: [phone]   Fax:  [phone]  Name: Brynden Thune MRN: [id number] Date of Birth: October 01, 1946

## 2016-03-31 NOTE — Therapy (Signed)
[facility] [address] [place], [place], [address] Phone: [phone]   Fax:  [phone]  Speech Language Pathology Treatment  Patient Details  Name: Sean BirchwoodLarry Lee Wilkerson MRN: [id number] Date of Birth: [birth year] Referring Provider: Delia HeadySethi, Pramod  Encounter Date: 03/31/2016      End of Session - 03/31/16 1414    Visit Number 5   Number of Visits 17   Date for SLP Re-Evaluation 05/14/16   SLP Start Time 1406   SLP Stop Time  1446   SLP Time Calculation (min) 40 min   Activity Tolerance Patient tolerated treatment well      Past Medical History  Diagnosis Date  . Hypertension   . HTN (hypertension) 10/08/2015  . HLD (hyperlipidemia) 10/08/2015  . Stroke [facility](HCC)     Past Surgical History  Procedure Laterality Date  . No past surgeries      There were no vitals filed for this visit.      Subjective Assessment - 03/31/16 1413    Subjective Mod hoarseness continues.               ADULT SLP TREATMENT - 03/31/16 1415    General Information   Behavior/Cognition Alert;Cooperative;Pleasant mood   Treatment Provided   Treatment provided Cognitive-Linquistic   Cognitive-Linquistic Treatment   Treatment focused on Dysarthria   Skilled Treatment Pt req'd initial occasional min-mod A faded to rare min-mod A for labial exercises provided last session. Pt expressed frustration at his reduced ability with his speech/voice at this time. RMT completed with 19/25 "bike pump" reps. SLP stressed to pt to cont to work on therapy HEPs to afford him the best possible chance at recovery.  SLP added one exercise to lip regimen.   Assessment / Recommendations / Plan   Plan Continue with current plan of care   Progression Toward Goals   Progression toward goals Progressing toward goals          SLP Education - 03/31/16 1448    Education provided Yes   Education Details additional lip exercise, stroke support  groups   Person(s) Educated Patient   Methods Explanation;Demonstration;Verbal cues;Handout   Comprehension Verbalized understanding;Returned demonstration;Need further instruction;Verbal cues required          SLP Short Term Goals - 03/31/16 1414    SLP SHORT TERM GOAL #1   Title pt will demo 23/25 reps of EMST with proper procedure over 4 sessions   Time 1   Period Weeks   Status On-going   SLP SHORT TERM GOAL #2   Title pt will demo oral-motor HEP with rare min A    Time 1   Period Weeks   Status On-going   SLP SHORT TERM GOAL #3   Title pt will demo 70dB average volume in 5 minutes simple conversation over three sessions   Time 1   Period Weeks   Status On-going          SLP Long Term Goals - 03/31/16 1716    SLP LONG TERM GOAL #1   Title pt will demo 25/25 reps of EMST with appropriate procedure over 4 sessions   Time 5   Period Weeks   Status On-going   SLP LONG TERM GOAL #2   Title pt will demo oral motor HEP with SBA over three sessions   Time 5   Period Weeks   Status On-going   SLP LONG TERM GOAL #3   Title pt will produce 10 minutes  of simple to mod complex conversation with average 70dB with rare min A over three sessions   Time 5   Period Weeks   Status On-going          Plan - 03/31/16 1716    Clinical Impression Statement Pt presents with mod dysarthria continues with hoarse voice.  Pt would cont to benefit from skilled ST addressing der'd intelligibility due to reduced breath support and reduced articulatory precision. Pt provided support group info today as he became teary when talking about his deficits with speech.   Speech Therapy Frequency 2x / week   Duration --  6 weeks   Treatment/Interventions Oral motor exercises;Compensatory strategies;SLP instruction and feedback;Patient/family education;Functional tasks;Cueing hierarchy   Potential to Achieve Goals Good   Potential Considerations Severity of impairments      Patient will  benefit from skilled therapeutic intervention in order to improve the following deficits and impairments:   Dysarthria and anarthria    Problem List Patient Active Problem List   Diagnosis Date Noted  . Slow transit constipation   . Neuropathic pain   . Stroke, acute, thrombotic (HCC) 10/08/2015  . 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 Essential hypertension 10/08/2015  . HLD (hyperlipidemia) 10/08/2015  . Hemiplegia and hemiparesis following cerebral infarction affecting right dominant side (HCC)   . Right hemiplegia (HCC) 10/06/2015  . Stroke (cerebrum) (HCC) 10/05/2015  . CVA (cerebral infarction) 10/05/2015    [facility] ,MS, CCC-SLP  03/31/2016, 5:17 PM  [facility] [address] [place], [place], [address] Phone: [phone]   Fax:  [phone]   Name: Sean Wilkerson MRN: [id number] Date of Birth: [birth year]

## 2016-03-31 NOTE — Therapy (Signed)
[facility] [address] [place], [place], [address] Phone: [phone]   Fax:  [phone]  Physical Therapy Treatment  Patient Details  Name: Sean Wilkerson MRN: [id number] Date of Birth: 03/03/1946 Referring Provider: Dr. Delia Heady  Encounter Date: 03/31/2016      PT End of Session - 03/31/16 1741    Visit Number 7   Number of Visits 17   Date for PT Re-Evaluation 05/04/16   Authorization Type UHC Medicare   Authorization Time Period 03-05-16 - 05-04-16   PT Start Time 1316   PT Stop Time 1400   PT Time Calculation (min) 44 min   Equipment Utilized During Treatment Gait belt   Activity Tolerance Patient limited by fatigue  gait distance limited by fatigue   Behavior During Therapy [facility] for tasks assessed/performed      Past Medical History  Diagnosis Date  . Hypertension   . HTN (hypertension) 10/08/2015  . HLD (hyperlipidemia) 10/08/2015  . Stroke [facility])     Past Surgical History  Procedure Laterality Date  . No past surgeries      There were no vitals filed for this visit.      Subjective Assessment - 03/31/16 1320    Subjective Pt reports no significant changes and no falls. Brought personal PFRW to therapy today.    Pertinent History L CVA 10-05-15 - hospitalized until Jan - D/C'd home with Home Health PT, OT ST - Home health finished up on 02-25-16   Patient Stated Goals "I want to walk without anything"   Currently in Pain? No/denies                         [facility] Adult PT Treatment/Exercise - 03/31/16 1731    Transfers   Transfers Sit to Stand;Stand to Sit   Sit to Stand 4: Min assist   Sit to Stand Details (indicate cue type and reason) tactile cuing at R knee for WB, positioning, proprioception   Stand to Sit 4: Min guard   Stand Pivot Transfers 3: Mod assist   Stand Pivot Transfer Details (indicate cue type and reason) without AD; manual facilitation of weight  shift to L   Ambulation/Gait   Ambulation/Gait Yes   Ambulation/Gait Assistance 3: Mod assist   Ambulation/Gait Assistance Details Gait performed with R Blue Rocker AFO. Gait x35', x40, and x20' (seated rest breaks between trials) with R PFRW and mod A for stability/balance. Multimodal cueing for upright posture, anterior weigtht shift and gluteus max/med activation during LLE advancement, manual facilitation to prevent posterior trunk rotation of trunk on R. When pt fatigued, required manual blocking at posterior knee to prevent R genu recuvrvatum during R mid/terminal stance. Cueing for increased LLE step length. Noted RLE adduction during initial trial; however, this improved after personal R PFRW modified to move platform from inside to outside of RW.   Ambulation Distance (Feet) 100 Feet  total; see above   Assistive device Right platform walker;Other (Comment)  R Blue Rocker AFO   Gait Pattern Step-to pattern;Decreased hip/knee flexion - right;Decreased stance time - right;Decreased dorsiflexion - right;Right genu recurvatum;Decreased weight shift to right;Decreased step length - left;Trunk rotated posteriorly on right   Ambulation Surface Level;Indoor   Gait Comments Assist of 2 therapists provided during gait to maximize effectiveness of cueing and due to fact that pt fatigues quickly during gait.  PT Education - 03/31/16 1730    Education provided Yes   Education Details Explained rationale for modifying personal R PRFW (to place platform on outside of RW to decrease RLE adduction during gait).   Person(s) Educated Patient   Methods Explanation   Comprehension Verbalized understanding          PT Short Term Goals - 03/06/16 1912    PT SHORT TERM GOAL #1   Title Pt will perform basic transfers with S only - wheelchair to/from mat.  (04-05-16)   Baseline min to mod assist with stand pivot   Time 4   Status New   PT SHORT TERM GOAL #2   Title Stand for at  least 3" with intermittent UE support at counter without KAFO on RLE with SBA for incr. independence with ADL's.  (04-05-16)   Time 4   Period Weeks   Status New   PT SHORT TERM GOAL #3   Title Amb. 150' with platform RW with CGA on flat, even surface.  (04-05-16)   Baseline 50' with RW   Time 4   Period Weeks   Status New   PT SHORT TERM GOAL #4   Title Perform bed mobility including sit to/from supine with S.  (04-05-16)   Baseline mod to min assist required per wife's report   Time 4   Period Weeks   Status New   PT SHORT TERM GOAL #5   Title Independent in HEP for RLE strengthening.  (04-05-16)   Time 4   Period Weeks   Status New           PT Long Term Goals - 03/06/16 1917    PT LONG TERM GOAL #1   Title Modified independent with basic transfers.  (05-04-16)   Time 8   Period Weeks   Status New   PT LONG TERM GOAL #2   Title Modified independent with bed mobility.  (05-04-16)   Time 8   Period Weeks   Status New   PT LONG TERM GOAL #3   Title Amb. 6550' with hemiwalker with S for incr. household amb.  (05-04-16)   Time 8   Period Weeks   Status New   PT LONG TERM GOAL #4   Title Amb. 27240' with hemiwalker with CGA for incr. community accessibility.  (05-04-16)   Time 8   Period Weeks   Status New   PT LONG TERM GOAL #5   Title Negotiate steps (4) with min assist with use of hand rail .  (05-04-16)   Time 8   Period Weeks   Status New   Additional Long Term Goals   Additional Long Term Goals Yes   PT LONG TERM GOAL #6   Title Perform Berg balance test when appropriate and establish goal as needed.  (05-04-16)   Time 8   Period Weeks   Status New               Plan - 03/31/16 1743    Clinical Impression Statement Session focused on gait training with emphasis on R gluteus maximus/medius activation during R stance phase. Using R PFRW and R Blue Rocker AFO, pt able to control R genu recurvatum for approx. 25-50% of gait trial with cueing for weight shift  and R stance stability. Gait distance limited by decreased activity tolerance.    Rehab Potential Good   PT Frequency 2x / week   PT Duration 8 weeks   PT Treatment/Interventions ADLs/Self  Care Home Management;DME Instruction;Balance training;Therapeutic exercise;Therapeutic activities;Functional mobility training;Stair training;Gait training;Neuromuscular re-education;Patient/family education;Orthotic Fit/Training;Passive range of motion   PT Next Visit Plan standing weigthshifting and RLE weightbearing activities; RLE strengthening and neuro-re-education.   Consulted and Agree with Plan of Care Patient      Patient will benefit from skilled therapeutic intervention in order to improve the following deficits and impairments:  Abnormal gait, Decreased activity tolerance, Decreased balance, Decreased coordination, Decreased endurance, Decreased mobility, Decreased range of motion, Decreased strength, Impaired tone, Impaired UE functional use, Obesity  Visit Diagnosis: Hemiplegia and hemiparesis following cerebral infarction affecting right non-dominant side (HCC)  Other abnormalities of gait and mobility     Problem List Patient Active Problem List   Diagnosis Date Noted  . Slow transit constipation   . Neuropathic pain   . Stroke, acute, thrombotic (HCC) 10/08/2015  . 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 Essential hypertension 10/08/2015  . HLD (hyperlipidemia) 10/08/2015  . Hemiplegia and hemiparesis following cerebral infarction affecting right dominant side (HCC)   . Right hemiplegia (HCC) 10/06/2015  . Stroke (cerebrum) (HCC) 10/05/2015  . CVA (cerebral infarction) 10/05/2015    Jorje Guild, PT, DPT [facility] [address] [place], [place], [address] Phone: [phone]   Fax:  [phone] 03/31/2016, 5:47 PM  Name: Acelin Ferdig MRN: [id number] Date of  Birth: [birth year]

## 2016-04-02 ENCOUNTER — Encounter: Payer: Self-pay | Admitting: Rehabilitation

## 2016-04-02 ENCOUNTER — Ambulatory Visit: Payer: Medicare Other

## 2016-04-02 ENCOUNTER — Ambulatory Visit: Payer: Medicare Other | Admitting: Rehabilitation

## 2016-04-02 ENCOUNTER — Ambulatory Visit: Payer: Medicare Other | Admitting: Occupational Therapy

## 2016-04-02 DIAGNOSIS — R471 Dysarthria and anarthria: Secondary | ICD-10-CM

## 2016-04-02 DIAGNOSIS — M6281 Muscle weakness (generalized): Secondary | ICD-10-CM

## 2016-04-02 DIAGNOSIS — R2681 Unsteadiness on feet: Secondary | ICD-10-CM

## 2016-04-02 DIAGNOSIS — R2689 Other abnormalities of gait and mobility: Secondary | ICD-10-CM

## 2016-04-02 NOTE — Therapy (Signed)
[facility] [address] [place], [place], [address] Phone: [phone]   Fax:  [phone]  Physical Therapy Treatment  Patient Details  Name: Sean Wilkerson MRN: [id number] Date of Birth: [birth year] Referring Provider: Dr. Delia Heady  Encounter Date: 04/02/2016      PT End of Session - 04/02/16 1632    Visit Number 8   Number of Visits 17   Date for PT Re-Evaluation 05/04/16   Authorization Type UHC Medicare   Authorization Time Period 03-05-16 - 05-04-16   PT Start Time 1315   PT Stop Time 1403   PT Time Calculation (min) 48 min   Equipment Utilized During Treatment Gait belt   Activity Tolerance Patient limited by fatigue  gait distance limited by fatigue   Behavior During Therapy [facility] for tasks assessed/performed      Past Medical History  Diagnosis Date  . Hypertension   . HTN (hypertension) 10/08/2015  . HLD (hyperlipidemia) 10/08/2015  . Stroke [facility])     Past Surgical History  Procedure Laterality Date  . No past surgeries      There were no vitals filed for this visit.      Subjective Assessment - 04/02/16 1324    Subjective "I'm not walking as much at home because I don't like to use the brace."    Patient is accompained by: Family member   Pertinent History L CVA 10-05-15 - hospitalized until Jan - D/C'd home with Home Health PT, OT ST - Home health finished up on 02-25-16   Patient Stated Goals "I want to walk without anything"   Currently in Pain? Yes   Pain Score 3    Pain Location Arm   Pain Orientation Right   Pain Descriptors / Indicators Aching   Pain Type Acute pain   Pain Onset More than a month ago   Pain Frequency Intermittent   Aggravating Factors  malpositioning   Pain Relieving Factors repositioning              NMR:  Addressed NMR for RLE during session via tall kneeling while reaching for targets towards the R and superiorly in order to increase weight  shift and WB on RLE as well as improving upright posture and increasing R hip protraction (increased activation of glute med).  Tolerated for short period before needing to discontinue due to discomfort.  Progressed to sit<>stand transitions with use of mirror for increased visual feedback in order to work on midline orientation, equal WB and improved postural control.  Provided moderate facilitation at trunk for posture throughout sit and stand with cues for increased forward weight shift from hips and also assist at R knee to prevent recurvatum, but note pt did very well with maintaining knee control with no instances of buckle.  Performed x 8 reps.  Then performed standing activity reaching across midline to the R for targets for prolonged and sustained activation of R glute med as well as improved weight shift to the R.  Again, utilized Ship broker for feedback with continuous verbal and visual cues for R weight shift and decreased trunk rotation.  From this activity added mini squats placing pins in front of him to low target before returning to standing.  Requires up to max A, esp for returning to stand due to fatigue and decreased quad control wanting to hyperextend into recurvatum.  Ended session with forward and retro stepping of LLE in order to increase time spent on RLE.  Max facilitation and cues during activity to not only achieve (did well) but maintain this during stepping.                     PT Education - 04/02/16 1631    Education provided Yes   Education Details importance of midline stance and increased weight shift to the R.     Person(s) Educated Patient   Methods Explanation   Comprehension Verbalized understanding          PT Short Term Goals - 03/06/16 1912    PT SHORT TERM GOAL #1   Title Pt will perform basic transfers with S only - wheelchair to/from mat.  (04-05-16)   Baseline min to mod assist with stand pivot   Time 4   Status New   PT SHORT TERM GOAL #2    Title Stand for at least 3" with intermittent UE support at counter without KAFO on RLE with SBA for incr. independence with ADL's.  (04-05-16)   Time 4   Period Weeks   Status New   PT SHORT TERM GOAL #3   Title Amb. 150' with platform RW with CGA on flat, even surface.  (04-05-16)   Baseline 50' with RW   Time 4   Period Weeks   Status New   PT SHORT TERM GOAL #4   Title Perform bed mobility including sit to/from supine with S.  (04-05-16)   Baseline mod to min assist required per wife's report   Time 4   Period Weeks   Status New   PT SHORT TERM GOAL #5   Title Independent in HEP for RLE strengthening.  (04-05-16)   Time 4   Period Weeks   Status New           PT Long Term Goals - 03/06/16 1917    PT LONG TERM GOAL #1   Title Modified independent with basic transfers.  (05-04-16)   Time 8   Period Weeks   Status New   PT LONG TERM GOAL #2   Title Modified independent with bed mobility.  (05-04-16)   Time 8   Period Weeks   Status New   PT LONG TERM GOAL #3   Title Amb. 5850' with hemiwalker with S for incr. household amb.  (05-04-16)   Time 8   Period Weeks   Status New   PT LONG TERM GOAL #4   Title Amb. 92240' with hemiwalker with CGA for incr. community accessibility.  (05-04-16)   Time 8   Period Weeks   Status New   PT LONG TERM GOAL #5   Title Negotiate steps (4) with min assist with use of hand rail .  (05-04-16)   Time 8   Period Weeks   Status New   Additional Long Term Goals   Additional Long Term Goals Yes   PT LONG TERM GOAL #6   Title Perform Berg balance test when appropriate and establish goal as needed.  (05-04-16)   Time 8   Period Weeks   Status New               Plan - 04/02/16 1635    Clinical Impression Statement Skilled session focused on RLE NMR via tall kneeling, sit<>stand transitions, standing weight shifts, and crossing midline to increase R weight shift and WB.    Rehab Potential Good   PT Frequency 2x / week   PT Duration 8  weeks   PT Treatment/Interventions  ADLs/Self Care Home Management;DME Instruction;Balance training;Therapeutic exercise;Therapeutic activities;Functional mobility training;Stair training;Gait training;Neuromuscular re-education;Patient/family education;Orthotic Fit/Training;Passive range of motion   PT Next Visit Plan standing weigthshifting and RLE weightbearing activities; RLE strengthening and neuro-re-education.   Consulted and Agree with Plan of Care Patient      Patient will benefit from skilled therapeutic intervention in order to improve the following deficits and impairments:  Abnormal gait, Decreased activity tolerance, Decreased balance, Decreased coordination, Decreased endurance, Decreased mobility, Decreased range of motion, Decreased strength, Impaired tone, Impaired UE functional use, Obesity  Visit Diagnosis: Unsteadiness on feet  Other abnormalities of gait and mobility  Muscle weakness (generalized)     Problem List Patient Active Problem List   Diagnosis Date Noted  . Slow transit constipation   . Neuropathic pain   . Stroke, acute, thrombotic (HCC) 10/08/2015  . 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 Essential hypertension 10/08/2015  . HLD (hyperlipidemia) 10/08/2015  . Hemiplegia and hemiparesis following cerebral infarction affecting right dominant side (HCC)   . Right hemiplegia (HCC) 10/06/2015  . Stroke (cerebrum) (HCC) 10/05/2015  . CVA (cerebral infarction) 10/05/2015    Harriet Butte, PT, MPT [facility] [address] [place], [place], [address] Phone: [phone]   Fax:  [phone] 04/02/2016, 4:38 PM  Name: Sean Wilkerson MRN: [id number] Date of Birth: 30-Sep-1946

## 2016-04-02 NOTE — Therapy (Signed)
[facility] [address] [place], [place], [address] Phone: [phone]   Fax:  [phone]  Speech Language Pathology Treatment  Patient Details  Name: Sean Wilkerson MRN: [id number] Date of Birth: Nov 15, 1945 Referring Provider: Antony Contras  Encounter Date: 04/02/2016      End of Session - 04/02/16 1457    Visit Number 6   Number of Visits 17   Date for SLP Re-Evaluation 05/14/16   SLP Start Time 7564   SLP Stop Time  1445   SLP Time Calculation (min) 42 min   Activity Tolerance Patient tolerated treatment well      Past Medical History  Diagnosis Date  . Hypertension   . HTN (hypertension) 10/08/2015  . HLD (hyperlipidemia) 10/08/2015  . Stroke [facility])     Past Surgical History  Procedure Laterality Date  . No past surgeries      There were no vitals filed for this visit.      Subjective Assessment - 04/02/16 1410    Subjective Mod hoarseness and aphonia continue.   Currently in Pain? No/denies               ADULT SLP TREATMENT - 04/02/16 1415    General Information   Behavior/Cognition Alert;Cooperative;Pleasant mood   Treatment Provided   Treatment provided Cognitive-Linquistic   Cognitive-Linquistic Treatment   Treatment focused on Dysarthria   Skilled Treatment SLP facilitated practice with pt's EMST for improved speech and speech intelligibility. "Bike pump" sound req'd min A from SLP and was heard 20/25 reps (in 5 sets of 5 reps). SPL attempted laryngeal adduction exercises without success in improved vocal quality. Labial exercises completed with visual cues due to oral nonverbal apraxia.   Assessment / Recommendations / Plan   Plan Continue with current plan of care            SLP Short Term Goals - 04/02/16 1458    SLP SHORT TERM GOAL #1   Title pt will demo 23/25 reps of EMST with proper procedure over 4 sessions   Status Not Met   SLP SHORT TERM GOAL #2   Title  pt will demo oral-motor HEP with rare min A    Status Partially Met   SLP SHORT TERM GOAL #3   Title pt will demo 70dB average volume in 5 minutes simple conversation over three sessions   Status Not Met          SLP Long Term Goals - 04/02/16 1459    SLP LONG TERM GOAL #1   Title pt will demo 22/25 reps of EMST with appropriate procedure over 4 sessions   Time 5   Period Weeks   Status Revised   SLP LONG TERM GOAL #2   Title pt will demo oral motor HEP with rare min A over three sessions   Time 5   Period Weeks   Status Revised   SLP LONG TERM GOAL #3   Title pt will demo reduced rate of speech in 5 minutes simple conversation adequate for 95% intelligibility   Time 5   Period Weeks   Status On-going          Plan - 04/02/16 1457    Clinical Impression Statement Pt presents with mod dysarthria continues with mod-max hoarse voice.  Pt would cont to benefit from skilled ST addressing der'd intelligibility due to reduced breath support and reduced articulatory precision. Pt provided support group info today as he became teary when  talking about his deficits with speech.   Speech Therapy Frequency 2x / week   Duration --  5 weeks   Treatment/Interventions Oral motor exercises;Compensatory strategies;SLP instruction and feedback;Patient/family education;Functional tasks;Cueing hierarchy   Potential to Achieve Goals Good   Potential Considerations Severity of impairments      Patient will benefit from skilled therapeutic intervention in order to improve the following deficits and impairments:   Dysarthria and anarthria    Problem List Patient Active Problem List   Diagnosis Date Noted  . Slow transit constipation   . Neuropathic pain   . Stroke, acute, thrombotic ([place]) 10/08/2015  . Hemiparesis, aphasia, and dysphagia as late effect of cerebrovascular accident (CVA) ([place]) 10/08/2015  . Essential hypertension 10/08/2015  . HLD (hyperlipidemia) 10/08/2015  .  Hemiplegia and hemiparesis following cerebral infarction affecting right dominant side ([place])   . Right hemiplegia ([place]) 10/06/2015  . Stroke (cerebrum) ([place]) 10/05/2015  . CVA (cerebral infarction) 10/05/2015    [facility] ,MS, [place]  04/02/2016, 3:01 PM  [facility] [address] [place], [place], [address] Phone: [phone]   Fax:  [phone]   Name: Sean Wilkerson MRN: [id number] Date of Birth: [birth year]

## 2016-04-06 ENCOUNTER — Ambulatory Visit: Payer: Medicare Other | Admitting: Occupational Therapy

## 2016-04-06 ENCOUNTER — Encounter: Payer: Medicare Other | Admitting: Occupational Therapy

## 2016-04-06 ENCOUNTER — Encounter: Payer: Self-pay | Admitting: Occupational Therapy

## 2016-04-06 ENCOUNTER — Ambulatory Visit: Payer: Medicare Other | Admitting: Physical Therapy

## 2016-04-06 DIAGNOSIS — M6281 Muscle weakness (generalized): Secondary | ICD-10-CM | POA: Diagnosis not present

## 2016-04-06 DIAGNOSIS — R2681 Unsteadiness on feet: Secondary | ICD-10-CM

## 2016-04-06 DIAGNOSIS — I69353 Hemiplegia and hemiparesis following cerebral infarction affecting right non-dominant side: Secondary | ICD-10-CM

## 2016-04-06 DIAGNOSIS — R2689 Other abnormalities of gait and mobility: Secondary | ICD-10-CM

## 2016-04-06 DIAGNOSIS — R278 Other lack of coordination: Secondary | ICD-10-CM

## 2016-04-06 NOTE — Therapy (Signed)
[facility] [address] [place], [place], [address] Phone: [phone]   Fax:  [phone]  Occupational Therapy Treatment  Patient Details  Name: Sean Wilkerson MRN: [id number] Date of Birth: 19-Jul-1946 Referring Provider: Dr. Pearlean Brownie  Encounter Date: 04/06/2016      OT End of Session - 04/06/16 1515    Visit Number 8   Number of Visits 17   Date for OT Re-Evaluation 05/02/16   Authorization Type UMR MCR   Authorization - Visit Number 8   Authorization - Number of Visits 10   OT Start Time 1402   OT Stop Time 1447   OT Time Calculation (min) 45 min   Activity Tolerance Patient tolerated treatment well   Behavior During Therapy [facility] for tasks assessed/performed      Past Medical History  Diagnosis Date  . Hypertension   . HTN (hypertension) 10/08/2015  . HLD (hyperlipidemia) 10/08/2015  . Stroke [facility])     Past Surgical History  Procedure Laterality Date  . No past surgeries      There were no vitals filed for this visit.      Subjective Assessment - 04/06/16 1506    Subjective  Patient indicates he feels tired after PT session - but willing to work.   Pertinent History see Epic   Patient Stated Goals to be more indpendent   Currently in Pain? No/denies   Pain Score 0-No pain                      OT Treatments/Exercises (OP) - 04/06/16 1509    ADLs   Functional Mobility Patient unable to fit platform walker into his bathroom at home.  Patient very clearly wants to be more independent with bathroom transfers.  Spoke with PT, and discussed potential options for assistive devices for this transfer.  Patient able to use rolling walker with hand orthosis and step forward, backward, and turn with mod assist.  Patient needs mod physical and frequent verbal cues to be active on right leg - without hyperextending right knee.  Patient much better able to weight shift onto right leg, with  stable target on that side , e.g. shift toward me. Patient able to advance walker forward and backward using both arms, patient does best with slow deliberate stepping at this time.  Addressed functional mobiliy aimed at crossing threshold, opening lever handled door, stand to sit and sit to stand.                  OT Education - 04/06/16 1514    Education provided Yes   Education Details Importance of weight shifting onto active right leg for functional transfers   Person(s) Educated Patient;Spouse   Methods Explanation   Comprehension Verbalized understanding          OT Short Term Goals - 03/17/16 1523    OT SHORT TERM GOAL #1   Title I with inital HEP.- check 04/03/16   Status Achieved   OT SHORT TERM GOAL #2   Title Pt will demonstrate understanding of RUE positioning to minimize pain and risk for injury including splint wear PRN.   Status On-going   OT SHORT TERM GOAL #3   Title Pt will consistently donn shirt with min A.   Status On-going   OT SHORT TERM GOAL #4   Title Pt will perform toilet transfers with min A.   Status On-going   OT SHORT TERM GOAL #5  Title Pt will donn pants with min A using AE PRN.   Status On-going           OT Long Term Goals - 03/09/16 1724    OT LONG TERM GOAL #1   Title Pt will donn shirt with set up only.- check 05/01/16   Time 8   Period Weeks   Status New   OT LONG TERM GOAL #2   Title Pt will donn pants with minguard for balance.   Time 8   Period Weeks   Status New   OT LONG TERM GOAL #3   Title Pt will donn socks and shoes with min A.   Time 8   Period Weeks   Status New   OT LONG TERM GOAL #4   Title Pt will use RUE as a stabilizer for ADLS at least 25 % of the time with pain less than or equal to 3/10.   Time 8   Period Weeks   Status New   OT LONG TERM GOAL #5   Title Pt will demonstrate P/ROM shoulder flexion to 100* with pain no greater than 3/10.   Time 8   Period Weeks   Status New   OT LONG TERM  GOAL #6   Title Pt will demonstrate ability to stand at countertop and retrieve an item from midlevel shelf with LUE, with supervision and no LOB.   Time 8   Period Weeks   Status New               Plan - 04/06/16 1515    Clinical Impression Statement Patient showing steady improvement with functional mobility.  Patient demonstrates active movement in right shoulder and forearm - most evident in forced use conditions.     Rehab Potential Good   Clinical Impairments Affecting Rehab Potential Potential for depression- will monitor and offer support, resources as warranted   OT Frequency 2x / week   OT Duration 8 weeks   OT Treatment/Interventions Self-care/ADL training;Therapeutic exercise;Patient/family education;Balance training;Splinting;Neuromuscular education;Ultrasound;Energy conservation;Therapeutic exercises;Therapeutic activities;DME and/or AE instruction;Parrafin;Cryotherapy;Electrical Stimulation;Fluidtherapy;Cognitive remediation/compensation;Visual/perceptual remediation/compensation;Passive range of motion;Contrast Bath;Moist Heat   Plan Dynamic sitting and standing for right arm activation - forced use, functional mobility   Consulted and Agree with Plan of Care Patient      Patient will benefit from skilled therapeutic intervention in order to improve the following deficits and impairments:  Decreased coordination, Decreased range of motion, Increased edema, Decreased safety awareness, Decreased endurance, Decreased activity tolerance, Decreased knowledge of precautions, Impaired tone, Obesity, Pain, Impaired UE functional use, Decreased knowledge of use of DME, Decreased balance, Decreased cognition, Decreased mobility, Decreased strength, Impaired perceived functional ability, Impaired vision/preception  Visit Diagnosis: Unsteadiness on feet  Muscle weakness (generalized)  Hemiplegia and hemiparesis following cerebral infarction affecting right non-dominant side  (HCC)  Other lack of coordination    Problem List Patient Active Problem List   Diagnosis Date Noted  . Slow transit constipation   . Neuropathic pain   . Stroke, acute, thrombotic ([facility]) 10/08/2015  . 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 Essential hypertension 10/08/2015  . HLD (hyperlipidemia) 10/08/2015  . Hemiplegia and hemiparesis following cerebral infarction affecting right dominant side (HCC)   . Right hemiplegia (HCC) 10/06/2015  . Stroke (cerebrum) (H[facility]) 10/05/2015  . CVA (cerebral infarction) 10/05/2015    Collier Salina, OTR/L 04/06/2016, 3:18 PM  [facility] [address] [place], [place],  [address] Phone: [phone]   Fax:  [phone]  Name: Sean Wilkerson MRN: [id number] Date of Birth: 1946-10-14

## 2016-04-06 NOTE — Therapy (Signed)
[facility] [address] [place], [place], [address] Phone: [phone]   Fax:  [phone]  Physical Therapy Treatment  Patient Details  Name: Sean Wilkerson MRN: [id number] Date of Birth: 17-Feb-1946 Referring Provider: Dr. Delia HeadyPramod Sethi  Encounter Date: 04/06/2016      PT End of Session - 04/06/16 1443    Visit Number 9   Number of Visits 17   Date for PT Re-Evaluation 05/04/16   Authorization Type UHC Medicare   Authorization Time Period 03-05-16 - 05-04-16   PT Start Time 1318   PT Stop Time 1400   PT Time Calculation (min) 42 min   Equipment Utilized During Treatment Gait belt   Activity Tolerance Patient limited by fatigue   Behavior [facility] Therapy [facility] for tasks assessed/performed      Past Medical History  Diagnosis Date  . Hypertension   . HTN (hypertension) 10/08/2015  . HLD (hyperlipidemia) 10/08/2015  . Stroke [facility](HCC)     Past Surgical History  Procedure Laterality Date  . No past surgeries      There were no vitals filed for this visit.      Subjective Assessment - 04/06/16 1423    Subjective Pt reports no significant changes. No pain today. Patient with many questions for orthotist, Kathlene NovemberMike, who is present to assess patient today.    Pertinent History L CVA 10-05-15 - hospitalized until Jan - D/C'd home with Home Health PT, OT ST - Home health finished up on 02-25-16   Patient Stated Goals "I want to walk without anything"   Currently in Pain? No/denies                         [facility] Adult PT Treatment/Exercise - 04/06/16 0001    Transfers   Transfers Sit to Stand;Stand to Sit   Sit to Stand 4: Min guard   Sit to Stand Details (indicate cue type and reason) sit > stand from EOM with supervision, min guard while pt self-managing RUE, placing RUE in platform attachment   Stand to Sit 4: Min guard  min guard for balance during RUE management   Ambulation/Gait   Ambulation/Gait Yes   Ambulation/Gait Assistance 4: Min assist   Ambulation/Gait Assistance Details Gait x80' with R [place]er AFO, R PFRW with cueing for increased R anterolateral weight shift, increased LLE step length, R stance stability (emphasis on gluteus max/medius activation) to control R genu recurvatum. Orthotist, Kathlene NovemberMike, present to assess gait pattern to assist in decision-making for R AFO.   Ambulation Distance (Feet) 80 Feet   Assistive device Right platform walker;Other (Comment)  R [place]er AFO   Gait Pattern Step-to pattern;Decreased hip/knee flexion - right;Decreased stance time - right;Decreased dorsiflexion - right;Right genu recurvatum;Decreased weight shift to right;Decreased step length - left;Trunk rotated posteriorly on right   Ambulation Surface Level;Indoor   Neuro Re-ed    Neuro Re-ed Details  With 4" step under LLE to promote RLE WB, pt performed mini squats x8 reps with BUE support at R PFRW. Orthotist, Kathlene NovemberMike, simultaneously palpated R knee to assess R quadriceps control, hamstring activation.                PT Education - 04/06/16 1425    Education provided Yes   Education Details Plan for custom R AFO with double action metal uprights. Explained rationale for plan.    Person(s) Educated Patient   Methods Explanation   Comprehension Verbalized understanding  PT Short Term Goals - 03/06/16 1912    PT SHORT TERM GOAL #1   Title Pt will perform basic transfers with S only - wheelchair to/from mat.  (04-05-16)   Baseline min to mod assist with stand pivot   Time 4   Status New   PT SHORT TERM GOAL #2   Title Stand for at least 3" with intermittent UE support at counter without KAFO on RLE with SBA for incr. independence with ADL's.  (04-05-16)   Time 4   Period Weeks   Status New   PT SHORT TERM GOAL #3   Title Amb. 150' with platform RW with CGA on flat, even surface.  (04-05-16)   Baseline 50' with RW   Time 4   Period Weeks   Status  New   PT SHORT TERM GOAL #4   Title Perform bed mobility including sit to/from supine with S.  (04-05-16)   Baseline mod to min assist required per wife's report   Time 4   Period Weeks   Status New   PT SHORT TERM GOAL #5   Title Independent in HEP for RLE strengthening.  (04-05-16)   Time 4   Period Weeks   Status New           PT Long Term Goals - 03/06/16 1917    PT LONG TERM GOAL #1   Title Modified independent with basic transfers.  (05-04-16)   Time 8   Period Weeks   Status New   PT LONG TERM GOAL #2   Title Modified independent with bed mobility.  (05-04-16)   Time 8   Period Weeks   Status New   PT LONG TERM GOAL #3   Title Amb. 45' with hemiwalker with S for incr. household amb.  (05-04-16)   Time 8   Period Weeks   Status New   PT LONG TERM GOAL #4   Title Amb. 57' with hemiwalker with CGA for incr. community accessibility.  (05-04-16)   Time 8   Period Weeks   Status New   PT LONG TERM GOAL #5   Title Negotiate steps (4) with min assist with use of hand rail .  (05-04-16)   Time 8   Period Weeks   Status New   Additional Long Term Goals   Additional Long Term Goals Yes   PT LONG TERM GOAL #6   Title Perform Berg balance test when appropriate and establish goal as needed.  (05-04-16)   Time 8   Period Weeks   Status New               Plan - 04/06/16 1444    Clinical Impression Statement Orthotist, Kathlene November, present to assess patient's gait pattern, address need for less restrictve R AFO to maximize patient safety/independence with gait. Orthotist assessed gait impairments and quadriceps control and engaged in long discussion of patient goals, orthotist/PT goals, and concerns. After assessment and discussion, all in agreement to move forward with R custom AFO with double action metal uprights.   Rehab Potential Good   PT Frequency 2x / week   PT Duration 8 weeks   PT Treatment/Interventions ADLs/Self Care Home Management;DME Instruction;Balance  training;Therapeutic exercise;Therapeutic activities;Functional mobility training;Stair training;Gait training;Neuromuscular re-education;Patient/family education;Orthotic Fit/Training;Passive range of motion   PT Next Visit Plan *GCODES and PN. * Assess STG's.  Continue R NMR, WB activities, RLE weightshifting, gait training   Consulted and Agree with Plan of Care Patient  Patient will benefit from skilled therapeutic intervention in order to improve the following deficits and impairments:  Abnormal gait, Decreased activity tolerance, Decreased balance, Decreased coordination, Decreased endurance, Decreased mobility, Decreased range of motion, Decreased strength, Impaired tone, Impaired UE functional use, Obesity  Visit Diagnosis: Other abnormalities of gait and mobility     Problem List Patient Active Problem List   Diagnosis Date Noted  . Slow transit constipation   . Neuropathic pain   . Stroke, acute, thrombotic (HCC) 10/08/2015  . 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 Essential hypertension 10/08/2015  . HLD (hyperlipidemia) 10/08/2015  . Hemiplegia and hemiparesis following cerebral infarction affecting right dominant side (HCC)   . Right hemiplegia (HCC) 10/06/2015  . Stroke (cerebrum) (HCC) 10/05/2015  . CVA (cerebral infarction) 10/05/2015    Jorje Guild, PT, DPT [facility] [address] [place], [place], [address] Phone: [phone]   Fax:  [phone] 04/06/2016, 5:31 PM  Name: Sean Wilkerson MRN: [id number] Date of Birth: May 28, 1946

## 2016-04-08 ENCOUNTER — Ambulatory Visit: Payer: Medicare Other | Admitting: Occupational Therapy

## 2016-04-08 ENCOUNTER — Ambulatory Visit: Payer: Medicare Other | Admitting: Rehabilitation

## 2016-04-08 DIAGNOSIS — I69353 Hemiplegia and hemiparesis following cerebral infarction affecting right non-dominant side: Secondary | ICD-10-CM

## 2016-04-08 DIAGNOSIS — R2689 Other abnormalities of gait and mobility: Secondary | ICD-10-CM

## 2016-04-08 DIAGNOSIS — R2681 Unsteadiness on feet: Secondary | ICD-10-CM

## 2016-04-08 DIAGNOSIS — M6281 Muscle weakness (generalized): Secondary | ICD-10-CM | POA: Diagnosis not present

## 2016-04-08 DIAGNOSIS — R278 Other lack of coordination: Secondary | ICD-10-CM

## 2016-04-09 ENCOUNTER — Encounter: Payer: Self-pay | Admitting: Occupational Therapy

## 2016-04-09 ENCOUNTER — Encounter: Payer: Self-pay | Admitting: Rehabilitation

## 2016-04-09 DIAGNOSIS — M6281 Muscle weakness (generalized): Secondary | ICD-10-CM | POA: Diagnosis not present

## 2016-04-09 NOTE — Therapy (Signed)
[facility] [address] [place], [place], [address] Phone: [phone]   Fax:  [phone]  Physical Therapy Treatment  Patient Details  Name: Sean Wilkerson MRN: [id number] Date of Birth: 12/11/1945 Referring Provider: Dr. Pramod Sethi  Encounter Date: 04/08/2016      PT End of Session - 04/09/16 0741    Visit Number 10   Number of Visits 17   Date for PT Re-Evaluation 05/04/16   Authorization Type [employer]   PT Start Time 1448   PT Stop Time 1532   PT Time Calculation (min) 44 min   Equipment Utilized During Treatment Gait belt   Activity Tolerance Patient limited by fatigue   Behavior During Therapy WFL for tasks assessed/performed      Past Medical History  Diagnosis Date  . Hypertension   . HTN (hypertension) 10/08/2015  . HLD (hyperlipidemia) 10/08/2015  . Stroke (HCC)     Past Surgical History  Procedure Laterality Date  . No past surgeries      There were no vitals filed for this visit.      Subjective Assessment - 04/09/16 0740    Subjective "This is all new to me."  In regards to ambulation with our brace (to better simulate what he will be issued and with R hand orthosis).     Pertinent History L CVA 10-05-15 - hospitalized until Jan - D/C'd home with Home Health PT, OT ST - Home health finished up on 02-25-16   Patient Stated Goals "I want to walk without anything"   Currently in Pain? No/denies                         [facility] Adult PT Treatment/Exercise - 04/08/16 1450    Ambulation/Gait   Ambulation/Gait Yes   Ambulation/Gait Assistance 4: Min assist   Ambulation/Gait Assistance Details Focused on gait training with use of RW, R hand orthosis, and R AFO (blue rocker).  Performed three trials x 28', 29', and 42' at min A level with continued verbal cues and manual facilitation for posture, decreased R trunk rotation, improved R hip protraction and increased L step  length with slower gait speed to increase time spent on RLE.  Pt needing multiple seated rest breaks during session with cues to have improved breath control throughout.  Disucssed that PT feels he is ready to progress to hand orthosis more permanently , however pt would like one more session to practice with wife.     Ambulation Distance (Feet) 28 Feet  29 and 42   Assistive device Rolling walker  R hand orthosis   Gait Pattern Step-to pattern;Decreased hip/knee flexion - right;Decreased stance time - right;Decreased dorsiflexion - right;Right genu recurvatum;Decreased weight shift to right;Decreased step length - left;Trunk rotated posteriorly on right   Ambulation Surface Level;Indoor                PT Education - 04/09/16 0740    Education provided Yes   Education Details PT/OT discussed beginning to use R hand orthosis more permanantly on RW.  Pt request to use one more time and have wife assist to make sure they both feel comfortable.    Person(s) Educated Patient;Spouse   Methods Explanation   Comprehension Verbalized understanding          PT Short Term Goals - 04/09/16 0744    PT SHORT TERM GOAL #1   Title Pt will perform basic transfers with   S only - wheelchair to/from mat.  (04-05-16)   Baseline Min A with RW, AFO, and HO   Time 4   Status Partially Met  progressing   PT SHORT TERM GOAL #2   Title Stand for at least 3" with intermittent UE support at counter without KAFO on RLE with SBA for incr. independence with ADL's.  (04-05-16)   Time 4   Period Weeks   Status New   PT SHORT TERM GOAL #3   Title Amb. 150' with platform RW with CGA on flat, even surface.  (04-05-16)   Baseline 42' was longest distance at min A level 04/09/16   Time 4   Period Weeks   Status Partially Met  progressing   PT SHORT TERM GOAL #4   Title Perform bed mobility including sit to/from supine with S.  (04-05-16)   Baseline mod to min assist required per wife's report   Time 4    Period Weeks   Status New   PT SHORT TERM GOAL #5   Title Independent in HEP for RLE strengthening.  (04-05-16)   Time 4   Period Weeks   Status New           PT Long Term Goals - 03/06/16 1917    PT LONG TERM GOAL #1   Title Modified independent with basic transfers.  (05-04-16)   Time 8   Period Weeks   Status New   PT LONG TERM GOAL #2   Title Modified independent with bed mobility.  (05-04-16)   Time 8   Period Weeks   Status New   PT LONG TERM GOAL #3   Title Amb. 50' with hemiwalker with S for incr. household amb.  (05-04-16)   Time 8   Period Weeks   Status New   PT LONG TERM GOAL #4   Title Amb. 240' with hemiwalker with CGA for incr. community accessibility.  (05-04-16)   Time 8   Period Weeks   Status New   PT LONG TERM GOAL #5   Title Negotiate steps (4) with min assist with use of hand rail .  (05-04-16)   Time 8   Period Weeks   Status New   Additional Long Term Goals   Additional Long Term Goals Yes   PT LONG TERM GOAL #6   Title Perform Berg balance test when appropriate and establish goal as needed.  (05-04-16)   Time 8   Period Weeks   Status New               Plan - 04/09/16 0742    Clinical Impression Statement Skilled session focused on gait training with RW, R blue rocker AFO (to better simulate what he will have) and R hand orthosis.  Addressed quality of gait as well as increasing distance.     Rehab Potential Good   PT Frequency 2x / week   PT Duration 8 weeks   PT Treatment/Interventions ADLs/Self Care Home Management;DME Instruction;Balance training;Therapeutic exercise;Therapeutic activities;Functional mobility training;Stair training;Gait training;Neuromuscular re-education;Patient/family education;Orthotic Fit/Training;Passive range of motion   PT Next Visit Plan Assess remaining STG's.  Continue R NMR, WB activities, RLE weightshifting, gait training-go over gait with wife/pt with use of R HO   Consulted and Agree with Plan of Care  Patient      Patient will benefit from skilled therapeutic intervention in order to improve the following deficits and impairments:  Abnormal gait, Decreased activity tolerance, Decreased balance, Decreased coordination, Decreased endurance, Decreased   mobility, Decreased range of motion, Decreased strength, Impaired tone, Impaired UE functional use, Obesity  Visit Diagnosis: Unsteadiness on feet  Muscle weakness (generalized)  Hemiplegia and hemiparesis following cerebral infarction affecting right non-dominant side (HCC)  Other abnormalities of gait and mobility       G-Codes - 2016-04-23 0745    Functional Assessment Tool Used clinical judgment; pt requires min assist with amb. with R platform RW or R hand orthosis; min assist with transfers and bed mobility   Functional Limitation Mobility: Walking and moving around   Mobility: Walking and Moving Around Current Status [phone]) At least 60 percent but less than 80 percent impaired, limited or restricted   Mobility: Walking and Moving Around Goal Status [phone]) At least 40 percent but less than 60 percent impaired, limited or restricted     Physical Therapy Progress Note  Dates of Reporting Period: 5/19 to 04-24-2023  Objective Reports of Subjective Statement: See subjective above  Objective Measurements: gait and transfers at min A with RW, AFO, and R hand orthosis  Goal Update: See above  Plan: Continue current POC  Reason Skilled Services are Required: Pt continues to have deficits post CVA with R hemiparesis.  Requires min A for gait and transfers with min/mod cues for sequencing and technique.      Problem List Patient Active Problem List   Diagnosis Date Noted  . Slow transit constipation   . Neuropathic pain   . Stroke, acute, thrombotic ([place]) 10/08/2015  . Hemiparesis, aphasia, and dysphagia as late effect of cerebrovascular accident (CVA) ([place]) 10/08/2015  . Essential hypertension 10/08/2015  . HLD (hyperlipidemia)  10/08/2015  . Hemiplegia and hemiparesis following cerebral infarction affecting right dominant side ([place])   . Right hemiplegia ([place]) 10/06/2015  . Stroke (cerebrum) ([place]) 10/05/2015  . CVA (cerebral infarction) 10/05/2015    Cameron Sprang, PT, MPT [facility] [address] [place], [place], [address] Phone: [phone]   Fax:  [phone] 04/23/16, 8:05 AM  Name: Sean Wilkerson MRN: [id number] Date of Birth: June 10, 1946

## 2016-04-09 NOTE — Therapy (Signed)
[facility] [address] [place], [place], [address] Phone: [phone]   Fax:  [phone]  Occupational Therapy Treatment  Patient Details  Name: Sean BirchwoodLarry Lee Bejarano MRN: [id number] Date of Birth: 20-Jun-1946 Referring Provider: Dr. Pearlean BrownieSethi  Encounter Date: 04/08/2016      OT End of Session - 04/09/16 1026    Visit Number 9   Number of Visits 17   Date for OT Re-Evaluation 05/02/16   Authorization Type [employer] MCR   Authorization - Visit Number 9   Authorization - Number of Visits 10   OT Start Time 1530   OT Stop Time 1615   OT Time Calculation (min) 45 min   Activity Tolerance Patient tolerated treatment well   Behavior During Therapy [facility] for tasks assessed/performed      Past Medical History  Diagnosis Date  . Hypertension   . HTN (hypertension) 10/08/2015  . HLD (hyperlipidemia) 10/08/2015  . Stroke [facility](HCC)     Past Surgical History  Procedure Laterality Date  . No past surgeries      There were no vitals filed for this visit.      Subjective Assessment - 04/09/16 1009    Subjective  Patient indicates discomfort in right elbiow when performing self range of motion   Patient Stated Goals to be more indpendent   Currently in Pain? Yes   Pain Score 2    Pain Location Elbow   Pain Orientation Right   Pain Descriptors / Indicators Aching   Pain Type Acute pain   Pain Onset Today   Pain Frequency Intermittent   Aggravating Factors  Rapid self range of motion   Pain Relieving Factors Slow, deliberate self range of motion   Multiple Pain Sites No                      OT Treatments/Exercises (OP) - 04/09/16 0001    ADLs   UB Dressing Patient able to doff front opening or overhead shirt without assistance.  Patient needs mod assist currently for donning tshirt,and min assist to place right arm into sleeve for front opening shirt.  Reinforced with patient and wife the importance of allowing  him time to attempt independence with upper and lower body dressing.     Toileting Patient desires increased independence with toileting.  Patient recently attempting to use walker without platform- more likely this will fit into bathroom at home.  Patient walking with min assist with rolling walker and right hand orthosis.  Patient needs cueing for weight shift onto right LE, and active extension through right LE, while controling posture in trunk.  Patient with tendency to strongly rotate trunk to progress LE's for walking.  Patient needs intermittent min assist - straight path, but min to occasionally mod assist for backing up, turning, etc.  Patient tends to speed up which significantly reduces his postural control.  [name] unable to attend today's session, patient feels one more day of practice without platform walker will be beneficial before officially adjusting device for home.  Also spoke with wife, and she plans to attend therapy session Tuesday, to do hands on practice with walking to bathroom.                  OT Education - 04/09/16 1025    Education provided Yes   Education Details Upper and lower body dressing - allowing time for increased independence.  Wife to attend next week to practice walking  into / out of bathrrom with RW and hand orthosis (vs. platform)   Person(s) Educated Patient;Spouse   Methods Explanation;Demonstration   Comprehension Verbalized understanding;Need further instruction;Verbal cues required;Tactile cues required          OT Short Term Goals - 04/09/16 1029    OT SHORT TERM GOAL #1   Title I with inital HEP.- check 04/03/16   Status Achieved   OT SHORT TERM GOAL #2   Title Pt will demonstrate understanding of RUE positioning to minimize pain and risk for injury including splint wear PRN.   Status Achieved   OT SHORT TERM GOAL #3   Title Pt will consistently donn shirt with min A.   Status On-going   OT SHORT TERM GOAL #4   Title Pt  will perform toilet transfers with min A.   Status On-going   OT SHORT TERM GOAL #5   Title Pt will donn pants with min A using AE PRN.   Status On-going   OT SHORT TERM GOAL #6   Title Pt will  perform shower transfer and bathe with min A.   Status On-going   OT SHORT TERM GOAL #7   Title Pt will tolerate P/ROM shoulder flexion to 90* with no significant increase in pain.   Status Achieved           OT Long Term Goals - 03/09/16 1724    OT LONG TERM GOAL #1   Title Pt will donn shirt with set up only.- check 05/01/16   Time 8   Period Weeks   Status New   OT LONG TERM GOAL #2   Title Pt will donn pants with minguard for balance.   Time 8   Period Weeks   Status New   OT LONG TERM GOAL #3   Title Pt will donn socks and shoes with min A.   Time 8   Period Weeks   Status New   OT LONG TERM GOAL #4   Title Pt will use RUE as a stabilizer for ADLS at least 25 % of the time with pain less than or equal to 3/10.   Time 8   Period Weeks   Status New   OT LONG TERM GOAL #5   Title Pt will demonstrate P/ROM shoulder flexion to 100* with pain no greater than 3/10.   Time 8   Period Weeks   Status New   OT LONG TERM GOAL #6   Title Pt will demonstrate ability to stand at countertop and retrieve an item from midlevel shelf with LUE, with supervision and no LOB.   Time 8   Period Weeks   Status New               Plan - 04/09/16 1027    Clinical Impression Statement Patient showing steady improvement with functional mobility,and early right arm movement.  Patient needs increased focus on ADL at home to achieve short term goals.     Rehab Potential Good   Clinical Impairments Affecting Rehab Potential Potential for depression- will monitor and offer support, resources as warranted   OT Frequency 2x / week   OT Duration 8 weeks   OT Treatment/Interventions Self-care/ADL training;Therapeutic exercise;Patient/family education;Balance training;Splinting;Neuromuscular  education;Ultrasound;Energy conservation;Therapeutic exercises;Therapeutic activities;DME and/or AE instruction;Parrafin;Cryotherapy;Electrical Stimulation;Fluidtherapy;Cognitive remediation/compensation;Visual/perceptual remediation/compensation;Passive range of motion;Contrast Bath;Moist Heat   Plan G code, progress note due, toilet transfers, bathroom navigation with patient and wife.     Consulted and Agree with Plan of Care Patient  Family Member Consulted wife       Patient will benefit from skilled therapeutic intervention in order to improve the following deficits and impairments:  Decreased coordination, Decreased range of motion, Increased edema, Decreased safety awareness, Decreased endurance, Decreased activity tolerance, Decreased knowledge of precautions, Impaired tone, Obesity, Pain, Impaired UE functional use, Decreased knowledge of use of DME, Decreased balance, Decreased cognition, Decreased mobility, Decreased strength, Impaired perceived functional ability, Impaired vision/preception  Visit Diagnosis: Unsteadiness on feet  Hemiplegia and hemiparesis following cerebral infarction affecting right non-dominant side (HCC)  Other lack of coordination  Muscle weakness (generalized)    Problem List Patient Active Problem List   Diagnosis Date Noted  . Slow transit constipation   . Neuropathic pain   . Stroke, acute, thrombotic (HCC) 10/08/2015  . 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 Essential hypertension 10/08/2015  . HLD (hyperlipidemia) 10/08/2015  . Hemiplegia and hemiparesis following cerebral infarction affecting right dominant side (HCC)   . Right hemiplegia (HCC) 10/06/2015  . Stroke (cerebrum) (HCC) 10/05/2015  . CVA (cerebral infarction) 10/05/2015    Collier Salina, OTR/L 04/09/2016, 10:30 AM  [facility] [address] [place], [place], [address] Phone: [phone]   Fax:  [phone]  Name: Yadriel Kerrigan MRN: [id number] Date of Birth: 1946-08-17

## 2016-04-13 ENCOUNTER — Encounter: Payer: Self-pay | Admitting: Physical Therapy

## 2016-04-13 ENCOUNTER — Ambulatory Visit: Payer: Medicare Other

## 2016-04-13 ENCOUNTER — Ambulatory Visit: Payer: Medicare Other | Admitting: Occupational Therapy

## 2016-04-13 ENCOUNTER — Encounter: Payer: Self-pay | Admitting: Occupational Therapy

## 2016-04-13 ENCOUNTER — Ambulatory Visit: Payer: Medicare Other | Admitting: Physical Therapy

## 2016-04-13 DIAGNOSIS — M6281 Muscle weakness (generalized): Secondary | ICD-10-CM

## 2016-04-13 DIAGNOSIS — R2681 Unsteadiness on feet: Secondary | ICD-10-CM

## 2016-04-13 DIAGNOSIS — R471 Dysarthria and anarthria: Secondary | ICD-10-CM

## 2016-04-13 DIAGNOSIS — R278 Other lack of coordination: Secondary | ICD-10-CM

## 2016-04-13 DIAGNOSIS — I69353 Hemiplegia and hemiparesis following cerebral infarction affecting right non-dominant side: Secondary | ICD-10-CM

## 2016-04-13 DIAGNOSIS — R2689 Other abnormalities of gait and mobility: Secondary | ICD-10-CM

## 2016-04-13 NOTE — Patient Instructions (Signed)
Hand Splint    Wearing Schedule: Daytime As needed Up to 2 hours per session. 3 or more sessions per day. Check skin condition after 30 minutes for changes in color, irritation or swelling today.  If no problems arise, you can wear splint up to 2 hours at a time.  If problems arise, do not wear splint - bring in for modification.  Keep splint out of heat - made of plastic, can melt.     Copyright  VHI. All rights reserved.

## 2016-04-13 NOTE — Therapy (Signed)
[facility] [address] [place], [place], [address] Phone: [phone]   Fax:  [phone]  Occupational Therapy Treatment  Patient Details  Name: Sean Wilkerson MRN: [id number] Date of Birth: Jan 01, 1946 Referring Provider: Dr. Pearlean BrownieSethi  Encounter Date: 04/13/2016      OT End of Session - 04/13/16 1427    Number of Visits 17   Date for OT Re-Evaluation 05/02/16   Authorization Type UMR MCR   Authorization - Visit Number 10   Authorization - Number of Visits 20   OT Start Time 1316   OT Stop Time 1400   OT Time Calculation (min) 44 min   Activity Tolerance Patient tolerated treatment well   Behavior [facility] for tasks assessed/performed      Past Medical History  Diagnosis Date  . Hypertension   . HTN (hypertension) 10/08/2015  . HLD (hyperlipidemia) 10/08/2015  . Stroke [facility](HCC)     Past Surgical History  Procedure Laterality Date  . No past surgeries      There were no vitals filed for this visit.      Subjective Assessment - 04/13/16 1418    Subjective  I feel I am doing more of my own drressing, and bathing, and getting in and out of the car is easier now.     Patient is accompained by: Family member   Pertinent History see Epic   Patient Stated Goals to be more indpendent   Currently in Pain? No/denies   Pain Score 0-No pain                      OT Treatments/Exercises (OP) - 04/13/16 0001    Neurological Re-education Exercises   Other Exercises 1 Neuromuscular reeducation to address active movement in right UE.  While weight bearing through right hand, patient experienced wrist discomfort, not alleviated with repositioning, realignment.  Upon further assessment, patient with laxity throughout carpal bones.  Patient not experiencing discomfort beyond weight bearing situations.    Splinting   Splinting Fabricated wrist orthosis to promote better support to carpal bone  alignment.  Fabricated circumferential wrist brace with thumb in opposition position.  Wife present.  Educated patient and wife on wearing schedule, splint wear and care.                  OT Education - 04/13/16 1425    Education provided Yes   Education Details splint wear and care, importance of proper positioning to prevent wrist pain   Person(s) Educated Patient;Spouse   Methods Explanation;Demonstration   Comprehension Verbalized understanding          OT Short Term Goals - 04/13/16 1429    OT SHORT TERM GOAL #1   Title I with inital HEP.- check 04/03/16   Status Achieved   OT SHORT TERM GOAL #2   Title Pt will demonstrate understanding of RUE positioning to minimize pain and risk for injury including splint wear PRN.   Status Achieved   OT SHORT TERM GOAL #3   Title Pt will consistently donn shirt with min A.   Status Achieved   OT SHORT TERM GOAL #4   Title Pt will perform toilet transfers with min A.   Status Achieved   OT SHORT TERM GOAL #5   Title Pt will donn pants with min A using AE PRN.   Status Achieved   OT SHORT TERM GOAL #6   Title Pt will  perform shower  transfer and bathe with min A.   Status On-going   OT SHORT TERM GOAL #7   Status Achieved           OT Long Term Goals - 03/09/16 1724    OT LONG TERM GOAL #1   Title Pt will donn shirt with set up only.- check 05/01/16   Time 8   Period Weeks   Status New   OT LONG TERM GOAL #2   Title Pt will donn pants with minguard for balance.   Time 8   Period Weeks   Status New   OT LONG TERM GOAL #3   Title Pt will donn socks and shoes with min A.   Time 8   Period Weeks   Status New   OT LONG TERM GOAL #4   Title Pt will use RUE as a stabilizer for ADLS at least 25 % of the time with pain less than or equal to 3/10.   Time 8   Period Weeks   Status New   OT LONG TERM GOAL #5   Title Pt will demonstrate P/ROM shoulder flexion to 100* with pain no greater than 3/10.   Time 8   Period  Weeks   Status New   OT LONG TERM GOAL #6   Title Pt will demonstrate ability to stand at countertop and retrieve an item from midlevel shelf with LUE, with supervision and no LOB.   Time 8   Period Weeks   Status New               Plan - 04/14/2016 1428    Clinical Impression Statement Patient showing steady progress toward goals due to improved activity tolerance, improved activation trunk, Right LE, improved trust of therapy team, and beginning movement in right arm.     Rehab Potential Good   OT Frequency 2x / week   OT Duration 8 weeks   OT Treatment/Interventions Self-care/ADL training;Therapeutic exercise;Patient/family education;Balance training;Splinting;Neuromuscular education;Ultrasound;Energy conservation;Therapeutic exercises;Therapeutic activities;DME and/or AE instruction;Parrafin;Cryotherapy;Electrical Stimulation;Fluidtherapy;Cognitive remediation/compensation;Visual/perceptual remediation/compensation;Passive range of motion;Contrast Bath;Moist Heat   Plan splint check, NMR RUE, Functional mobility   Consulted and Agree with Plan of Care Patient   Family Member Consulted wife      Patient will benefit from skilled therapeutic intervention in order to improve the following deficits and impairments:  Decreased coordination, Decreased range of motion, Increased edema, Decreased safety awareness, Decreased endurance, Decreased activity tolerance, Decreased knowledge of precautions, Impaired tone, Obesity, Pain, Impaired UE functional use, Decreased knowledge of use of DME, Decreased balance, Decreased cognition, Decreased mobility, Decreased strength, Impaired perceived functional ability, Impaired vision/preception  Visit Diagnosis: Unsteadiness on feet  Hemiplegia and hemiparesis following cerebral infarction affecting right non-dominant side (HCC)  Other lack of coordination  Muscle weakness (generalized)      G-Codes - 14-Apr-2016 1432    Functional Assessment  Tool Used clinical impresssions /ADL status   Functional Limitation Self care   Self Care Current Status (Z6109) At least 40 percent but less than 60 percent impaired, limited or restricted   Self Care Goal Status (U0454) At least 20 percent but less than 40 percent impaired, limited or restricted      Problem List Patient Active Problem List   Diagnosis Date Noted  . Slow transit constipation   . Neuropathic pain   . Stroke, acute, thrombotic (HCC) 10/08/2015  . 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 Essential hypertension 10/08/2015  . HLD (  hyperlipidemia) 10/08/2015  . Hemiplegia and hemiparesis following cerebral infarction affecting right dominant side (HCC)   . Right hemiplegia (HCC) 10/06/2015  . Stroke (cerebrum) (HCC) 10/05/2015  . CVA (cerebral infarction) 10/05/2015   Occupational Therapy Progress Note  Dates of Reporting Period: 03/04/16 to 04/13/16  Objective Reports of Subjective Statement: See above  Objective Measurements: See above  Goal Update: See above  Plan: Continue with current plan of care.  Anticipate recertifying for additional OT visits as patient showing steady improvement and is becoming more independent with ADL.    Reason Skilled Services are Required: See above   Collier SalinaGellert, Ashaun Gaughan M, OTR/L 04/13/2016, 2:34 PM    [place] [facility] [address] [place], [place], [address] Phone: [phone]   Fax:  [phone]  Name: Sean Wilkerson MRN: [id number] Date of Birth: 09-28-1946

## 2016-04-13 NOTE — Patient Instructions (Signed)
  Please complete the assigned speech therapy homework and return it to your next session.  

## 2016-04-13 NOTE — Therapy (Signed)
[facility] [address] [place], [place], [address] Phone: [phone]   Fax:  [phone]  Speech Language Pathology Treatment  Patient Details  Name: Sean Wilkerson MRN: [id number] Date of Birth: [birth year] Referring Provider: Antony Contras  Encounter Date: 04/13/2016      End of Session - 04/13/16 1529    Visit Number 7   Number of Visits 17   Date for SLP Re-Evaluation 05/14/16   SLP Start Time 1450   SLP Stop Time  1530   SLP Time Calculation (min) 40 min   Activity Tolerance Patient tolerated treatment well      Past Medical History  Diagnosis Date  . Hypertension   . HTN (hypertension) 10/08/2015  . HLD (hyperlipidemia) 10/08/2015  . Stroke [facility])     Past Surgical History  Procedure Laterality Date  . No past surgeries      There were no vitals filed for this visit.      Subjective Assessment - 04/13/16 1453    Subjective Mod hoarseness and aphonia continue.   Currently in Pain? No/denies               ADULT SLP TREATMENT - 04/13/16 1455    General Information   Behavior/Cognition Alert;Cooperative;Pleasant mood   Treatment Provided   Treatment provided Cognitive-Linquistic   Cognitive-Linquistic Treatment   Treatment focused on Dysarthria   Skilled Treatment EMST practice was facilitated with SLP to be able to have pt achieve more success with "bike pump" sound. Appropriate sound 20/25 reps, with rare min A for full breath. SLP also facilitated pt's reduced rate in order to improve intelligibility. With structured sentence tasks, pt was 80% intelligible using ovdrarticulation. Mod-max SLP cues were necessary to further improve pt's intelligibilty.    Assessment / Recommendations / Plan   Plan Continue with current plan of care   Progression Toward Goals   Progression toward goals Progressing toward goals          SLP Education - 04/13/16 1529    Education provided Yes    Education Details overarticulation necessary to improve intelligibility   Person(s) Educated Patient   Methods Explanation;Demonstration   Comprehension Verbalized understanding;Returned demonstration;Need further instruction;Verbal cues required          SLP Short Term Goals - 04/02/16 1458    SLP SHORT TERM GOAL #1   Title pt will demo 23/25 reps of EMST with proper procedure over 4 sessions   Status Not Met   SLP SHORT TERM GOAL #2   Title pt will demo oral-motor HEP with rare min A    Status Partially Met   SLP SHORT TERM GOAL #3   Title pt will demo 70dB average volume in 5 minutes simple conversation over three sessions   Status Not Met          SLP Long Term Goals - 04/13/16 1454    SLP LONG TERM GOAL #1   Title pt will demo 22/25 reps of EMST with appropriate procedure over 4 sessions   Time 4   Period Weeks   Status Revised   SLP LONG TERM GOAL #2   Title pt will demo oral motor HEP with rare min A over three sessions   Time 4   Period Weeks   Status Revised   SLP LONG TERM GOAL #3   Title pt will demo reduced rate of speech in 5 minutes simple conversation adequate for 95% intelligibility   Time 4  Period Weeks   Status On-going          Plan - 04/13/16 1529    Clinical Impression Statement Pt presents with mod dysarthria continues with mod-max hoarse voice.  Pt would cont to benefit from skilled ST addressing der'd intelligibility due to reduced breath support and reduced articulatory precision.   Speech Therapy Frequency 2x / week   Duration 4 weeks   Treatment/Interventions Oral motor exercises;Compensatory strategies;SLP instruction and feedback;Patient/family education;Functional tasks;Cueing hierarchy   Potential to Achieve Goals Good   Potential Considerations Severity of impairments   Consulted and Agree with Plan of Care Patient      Patient will benefit from skilled therapeutic intervention in order to improve the following deficits and  impairments:   Dysarthria and anarthria    Problem List Patient Active Problem List   Diagnosis Date Noted  . Slow transit constipation   . Neuropathic pain   . Stroke, acute, thrombotic ([place]) 10/08/2015  . Hemiparesis, aphasia, and dysphagia as late effect of cerebrovascular accident (CVA) ([place]) 10/08/2015  . Essential hypertension 10/08/2015  . HLD (hyperlipidemia) 10/08/2015  . Hemiplegia and hemiparesis following cerebral infarction affecting right dominant side ([place])   . Right hemiplegia ([place]) 10/06/2015  . Stroke (cerebrum) ([place]) 10/05/2015  . CVA (cerebral infarction) 10/05/2015    [facility] 04/13/2016, 3:34 PM  [facility] [address] [place], [place], [address] Phone: [phone]   Fax:  [phone]   Name: Sean Wilkerson MRN: [id number] Date of Birth: Apr 01, 1946

## 2016-04-14 ENCOUNTER — Encounter: Payer: Self-pay | Admitting: Adult Health

## 2016-04-14 ENCOUNTER — Ambulatory Visit (INDEPENDENT_AMBULATORY_CARE_PROVIDER_SITE_OTHER): Payer: Medicare Other | Admitting: Adult Health

## 2016-04-14 VITALS — BP 129/75 | HR 63 | Ht 69.0 in

## 2016-04-14 DIAGNOSIS — Z8673 Personal history of transient ischemic attack (TIA), and cerebral infarction without residual deficits: Secondary | ICD-10-CM | POA: Diagnosis not present

## 2016-04-14 DIAGNOSIS — G8191 Hemiplegia, unspecified affecting right dominant side: Secondary | ICD-10-CM | POA: Diagnosis not present

## 2016-04-14 NOTE — Patient Instructions (Signed)
Continue aspirin Blood pressure goal less than 130/90 Cholesteroll LDL less than 70 Hemoglobin A1c less than 6.5% If you develop any strokelike symptoms call 911 immediately If your symptoms worsen or you develop new symptoms please let us know.

## 2016-04-14 NOTE — Progress Notes (Signed)
PATIENT: Sean Wilkerson DOB: 19-Oct-1945  REASON FOR VISIT: follow up- stroke HISTORY FROM: patient  HISTORY OF PRESENT ILLNESS: Sean Wilkerson is a 70 year old male with a history of stroke in the summer 2016. He returns today for follow-up. He is currently taking aspirin and tolerating it well. He denies any significant bruising or bleeding. The patient's blood pressure is in normal range. His primary care has been managing his cholesterol. Wife reports that he has a follow-up visit in August. 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 He states with rehabilitation he has been able to regain some movement of the right arm. He is currently participating in outpatient rehabilitation. He states that this is going very well. The patient is on gabapentin for nerve related pain. His wife reports that this was started in the hospital however he has not had any discomfort recently. The wife is questioning if he needs to stay on this medication. He returns today for an evaluation.   HISTORY 01/14/16 (sethI): 70 year male seen today for first office follow-up visit following hospital admission for stroke in December 2016. Sean Wilkerson is an 70 y.o. male with a history of hypertension, obesity and syncopal spells, brought to the ED and code stroke status following acute onset of slurred speech along with weakness and numbness involving his right side. Patient was last known well at 7:30 PM 10/05/15. He had no history of stroke nor TIA. He's been taking aspirin 81 mg per day. CT scan of his head showed no acute intracranial abnormality. NIH stroke score was 7. Patient was deemed a candidate for intravenous thrombolytic therapy with TPA, which was administered. Patient was subsequently admitted to neuro intensive care unit for further management. Patient has been experiencing a couple spells of unclear etiology and is currently wearing a long-term cardiac monitor. IV TPA 90 mg  Sunday 10/05/2015 at 2045 LSN: 7:30 PM on 10/05/2015 tPA Given: Yes patient was admitted to the intensive care unit where blood pressure was tightly controlled with close neurological monitoring. 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 MRA of the brain showed no large vessel occlusion or stenosis but mild atherosclerotic changes. LDL cholesterol was elevated at 135. Hemoglobin A1c was 6.0. Transthoracic echo showed normal ejection fraction without cardiac source of embolism. Carotid Doppler showed no significant extracranial stenosis. Patient's neurological exam actually worsened within the NIH scale score 7 on admission became-13 at the time of discharge. He was seen by physical occupational therapy and felt to be a good patient for rehabilitation and he finished her rehabilitation and is currently at home. He is doing home physical and occupation therapy. Is able to walk with a walker with a foot brace. He can feed himself and needs some help with transfers and with toilet. Patient states he did not tolerate Lipitor due to fear of side effects without actually having them and stopped it after a month. He is tolerating aspirin well without bruising or bleeding. Continues to have dense right hemiplegia with no movements practically  REVIEW OF SYSTEMS: Out of a complete 14 system review of symptoms, the patient complains only of the following symptoms, and all other reviewed systems are negative.  ALLERGIES: No Known Allergies  HOME MEDICATIONS: Outpatient Prescriptions Prior to Visit  Medication Sig Dispense Refill  . amLODipine (NORVASC) 10 MG tablet Take 1 tablet (10  mg total) by mouth daily. 30 tablet 1  . aspirin 81 MG tablet Take 81 mg by mouth daily.    . carvedilol (COREG) 25 MG tablet Take 1 tablet (25 mg total) by mouth 2 (two) times daily with a meal. 60 tablet 1   . diphenhydramine-acetaminophen (TYLENOL PM) 25-500 MG TABS tablet Take 1 tablet by mouth at bedtime.    . gabapentin (NEURONTIN) 400 MG capsule Take 2 capsules (800 mg total) by mouth 3 (three) times daily. 90 capsule 1  . methocarbamol (ROBAXIN) 500 MG tablet Take 1 tablet (500 mg total) by mouth every 6 (six) hours as needed for muscle spasms. 90 tablet 0  . Multiple Vitamin (MULTIVITAMIN WITH MINERALS) TABS tablet Take 1 tablet by mouth daily.    . nortriptyline (PAMELOR) 10 MG capsule Take 1 capsule (10 mg total) by mouth at bedtime. 30 capsule 1  . Red Yeast Rice 600 MG CAPS Take by mouth. Reported on 03/04/2016    . rosuvastatin (CRESTOR) 5 MG tablet Take 0.5 tablets (2.5 mg total) by mouth daily. 30 tablet 2   No facility-administered medications prior to visit.    PAST MEDICAL HISTORY: Past Medical History  Diagnosis Date  . Hypertension   . HTN (hypertension) 10/08/2015  . HLD (hyperlipidemia) 10/08/2015  . Stroke [facility](HCC)     PAST SURGICAL HISTORY: Past Surgical History  Procedure Laterality Date  . No past surgeries      FAMILY HISTORY: Family History  Problem Relation Age of Onset  . Stroke Brother     SOCIAL HISTORY: Social History   Social History  . Marital Status: Single    Spouse Name: N/A  . Number of Children: N/A  . Years of Education: N/A   Occupational History  . Not on file.   Social History Main Topics  . Smoking status: Never Smoker   . Smokeless tobacco: Not on file  . Alcohol Use: No  . Drug Use: No  . Sexual Activity: Not on file   Other Topics Concern  . Not on file   Social History Narrative      PHYSICAL EXAM  Filed Vitals:   04/14/16 1115  BP: 129/75  Pulse: 63  Height: 5\' 9"  (1.753 m)   There is no weight on file to calculate BMI.  Generalized: Well developed, in no acute distress   Neurological examination  Mentation: Alert oriented to time, place, history taking. Follows all commands speech and language  fluent Cranial nerve II-XII: Pupils were equal round reactive to light. Extraocular movements were full, visual field were full on confrontational test. Facial sensation and strength were normal. Uvula tongue midline. Head turning and shoulder shrug  were normal and symmetric. Motor: The motor testing reveals 5 over 5 strength In the left upper and lower extremity. Hemiplegia noted on the right-minimal movement noted in right arm. Sensory: Sensory testing is intact to soft touch on all 4 extremities. No evidence of extinction is noted.  Coordination: Cerebellar testing reveals good finger-nose-finger and heel-to-shin on the left Gait and station: Gait is normal. Tandem gait is normal. Romberg is negative. No drift is seen.  Reflexes: Deep tendon reflexes are symmetric and normal bilaterally.   DIAGNOSTIC DATA (LABS, IMAGING, TESTING) - I reviewed patient records, labs, notes, testing and imaging myself where available.  Lab Results  Component Value Date   WBC 5.7 11/18/2015   HGB 12.9* 11/18/2015   HCT 39.3 11/18/2015   MCV 98.0 11/18/2015   PLT 226 11/18/2015  Component Value Date/Time   NA 141 11/18/2015 2255   K 4.1 11/18/2015 2255   CL 107 11/18/2015 2255   CO2 24 11/18/2015 2255   GLUCOSE 184* 11/18/2015 2255   BUN 13 11/18/2015 2255   CREATININE 0.86 11/18/2015 2255   CALCIUM 9.3 11/18/2015 2255   PROT 6.5 10/09/2015 0456   ALBUMIN 3.3* 10/09/2015 0456   AST 25 10/09/2015 0456   ALT 29 10/09/2015 0456   ALKPHOS 72 10/09/2015 0456   BILITOT 0.7 10/09/2015 0456   GFRNONAA >60 11/18/2015 2255   GFRAA >60 11/18/2015 2255   Lab Results  Component Value Date   CHOL 205* 10/06/2015   HDL 44 10/06/2015   LDLCALC 135* 10/06/2015   TRIG 131 10/06/2015   CHOLHDL 4.7 10/06/2015   Lab Results  Component Value Date   HGBA1C 6.0* 10/06/2015     ASSESSMENT AND PLAN 70 y.o. year old male  has a past medical history of Hypertension; HTN (hypertension) (10/08/2015); HLD  (hyperlipidemia) (10/08/2015); and Stroke (HCC). here with:  1. Stroke 2. Right-sided hemiplegia   The patient will remain on aspirin for stroke prevention. He should maintain strict control of his blood pressure with goal less than 130/90, cholesterol LDL less than 70 and hemoglobin A1c less than 6.5%. The patient's wife was inquiring about gabapentin. I advised that she can slowly reduce his dose and see if his discomfort returns. If not she can continue weaning the patient down. We reviewed how to wean gabapentin. The patient is encouraged to participate in daily exercise if possible. Patient advised that if he develops any strokelike symptoms he should call 911 immediately. He will follow-up with our office in 6 months or sooner if needed.  Butch PennyMegan Melesio Madara, MSN, NP-C 04/14/2016, 11:19 AM [facility] [address], [address] [place], [place] [address] [phone]

## 2016-04-15 ENCOUNTER — Ambulatory Visit: Payer: Medicare Other | Admitting: Rehabilitation

## 2016-04-15 ENCOUNTER — Ambulatory Visit: Payer: Medicare Other | Admitting: Occupational Therapy

## 2016-04-15 ENCOUNTER — Ambulatory Visit: Payer: Medicare Other

## 2016-04-15 DIAGNOSIS — I69353 Hemiplegia and hemiparesis following cerebral infarction affecting right non-dominant side: Secondary | ICD-10-CM

## 2016-04-15 DIAGNOSIS — R2681 Unsteadiness on feet: Secondary | ICD-10-CM

## 2016-04-15 DIAGNOSIS — R278 Other lack of coordination: Secondary | ICD-10-CM

## 2016-04-15 DIAGNOSIS — R2689 Other abnormalities of gait and mobility: Secondary | ICD-10-CM

## 2016-04-15 DIAGNOSIS — M6281 Muscle weakness (generalized): Secondary | ICD-10-CM | POA: Diagnosis not present

## 2016-04-15 DIAGNOSIS — R471 Dysarthria and anarthria: Secondary | ICD-10-CM

## 2016-04-15 NOTE — Therapy (Signed)
[facility] [address] [place], [place], [address] Phone: [phone]   Fax:  [phone]  Physical Therapy Treatment  Patient Details  Name: Sean Wilkerson MRN: [id number] Date of Birth: Feb 02, 1946 Referring Provider: Dr. Antony Contras  Encounter Date: 04/13/2016   04/13/16 1406  PT Visits / Re-Eval  Visit Number 11  Number of Visits 17  Date for PT Re-Evaluation 05/04/16  Authorization  Authorization Type UHC Medicare  PT Time Calculation  PT Start Time 1962  PT Stop Time 1445  PT Time Calculation (min) 42 min  PT - End of Session  Equipment Utilized During Treatment Gait belt  Activity Tolerance Patient limited by fatigue  Behavior During Therapy [facility] for tasks assessed/performed     Past Medical History  Diagnosis Date  . Hypertension   . HTN (hypertension) 10/08/2015  . HLD (hyperlipidemia) 10/08/2015  . Stroke [facility])     Past Surgical History  Procedure Laterality Date  . No past surgeries      There were no vitals filed for this visit.     04/13/16 1405  Symptoms/Limitations  Subjective No new complaints. No falls or pain to report at this time.  Patient is accompained by: Family member  Pertinent History L CVA 10-05-15 - hospitalized until Jan - D/C'd home with [place], [place] finished up on 02-25-16  Patient Stated Goals "I want to walk without anything"  Pain Assessment  Currently in Pain? No/denies  Pain Score 0         04/13/16 1424  Transfers  Transfers Sit to Stand;Stand to Sit  Sit to Stand 4: Min guard;With upper extremity assist;With armrests;From chair/3-in-1  Sit to Stand Details Verbal cues for safe use of DME/AE;Verbal cues for precautions/safety  Sit to Stand Details (indicate cue type and reason) cues for equal LE weight bearing, midline position and for proper foot position with sit>stand transfers  Stand to Sit 4: Min guard;With upper  extremity assist;With armrests;To chair/3-in-1;Uncontrolled descent  Stand to Sit Details (indicate cue type and reason) Verbal cues for precautions/safety;Verbal cues for safe use of DME/AE  Stand to Sit Details cues to reach back to control descent with sitting down   Ambulation/Gait  Ambulation/Gait Yes  Ambulation/Gait Assistance 4: Min guard;4: Min assist;5: Supervision  Ambulation/Gait Assistance Details PTA assisted with 1st gait trial, spouse assisted with second and third gait trials with min guard assist to supervision from PTA. pt's spouse educated on how to assist pt with use of new hand orthotic and cues to provide pt with gait to decrease gait deviations.                                    Ambulation Distance (Feet) 52 Feet (x1, 40 x1, 30 x1  (spouse assist with S/MGA  from PTA))  Assistive device Rolling walker (with right hand orthotic)  Gait Pattern Step-to pattern;Decreased hip/knee flexion - right;Decreased stance time - right;Decreased dorsiflexion - right;Right genu recurvatum;Decreased weight shift to right;Decreased step length - left;Trunk rotated posteriorly on right  Ambulation Surface Level;Indoor            PT Short Term Goals - 04/13/16 1406    PT SHORT TERM GOAL #1   Title Pt will perform basic transfers with S only - wheelchair to/from mat.  (04-05-16)   Baseline Min A with RW, AFO, and HO  Time --   Status Partially Met  progressing   PT SHORT TERM GOAL #2   Title Stand for at least 3" with intermittent UE support at counter without KAFO on RLE with SBA for incr. independence with ADL's.  (04-05-16)   Baseline 04/13/16: pt able to stand for >/= 3 minutes with intermittent support on walker with min guard assist to supervision   Time --   Period --   Status Achieved   PT SHORT TERM GOAL #3   Title Amb. 150' with platform RW with CGA on flat, even surface.  (04-05-16)   Baseline 04/13/16: 52 with RW with right hand orthosis and AFO (bluerocker) with min  guard assist.   Time --   Period --   Status Partially Met  progressing   PT SHORT TERM GOAL #4   Title Perform bed mobility including sit to/from supine with S.  (04-05-16)   Baseline mod to min assist required per wife's report   Time 4   Period Weeks   Status New   PT SHORT TERM GOAL #5   Title Independent in HEP for RLE strengthening.  (04-05-16)   Time 4   Period Weeks   Status New           PT Long Term Goals - 03/06/16 1917    PT LONG TERM GOAL #1   Title Modified independent with basic transfers.  (05-04-16)   Time 8   Period Weeks   Status New   PT LONG TERM GOAL #2   Title Modified independent with bed mobility.  (05-04-16)   Time 8   Period Weeks   Status New   PT LONG TERM GOAL #3   Title Amb. 71' with hemiwalker with S for incr. household amb.  (05-04-16)   Time 8   Period Weeks   Status New   PT LONG TERM GOAL #4   Title Amb. 15' with hemiwalker with CGA for incr. community accessibility.  (05-04-16)   Time 8   Period Weeks   Status New   PT LONG TERM GOAL #5   Title Negotiate steps (4) with min assist with use of hand rail .  (05-04-16)   Time 8   Period Weeks   Status New   Additional Long Term Goals   Additional Long Term Goals Yes   PT LONG TERM GOAL #6   Title Perform Berg balance test when appropriate and establish goal as needed.  (05-04-16)   Time 8   Period Weeks   Status New        04/13/16 1406  Plan  Clinical Impression Statement Pt has met his standing goal for ADL's and made more progress toward gait goal. Pt continues to progress toward all unmet goals.Spouse appears more confident in assisting pt with gait at home after additional practice in today's sessionl  Pt will benefit from skilled therapeutic intervention in order to improve on the following deficits Abnormal gait;Decreased activity tolerance;Decreased balance;Decreased coordination;Decreased endurance;Decreased mobility;Decreased range of motion;Decreased strength;Impaired  tone;Impaired UE functional use;Obesity  Rehab Potential Good  PT Frequency 2x / week  PT Duration 8 weeks  PT Treatment/Interventions ADLs/Self Care Home Management;DME Instruction;Balance training;Therapeutic exercise;Therapeutic activities;Functional mobility training;Stair training;Gait training;Neuromuscular re-education;Patient/family education;Orthotic Fit/Training;Passive range of motion  PT Next Visit Plan Assess remaining STG's- bed mobility and HEP compliance  Continue R NMR, WB activities, RLE weightshifting, gait training-go over gait with wife/pt with use of Rt hand orthotic  Consulted and Agree with Plan of  Care Patient       Patient will benefit from skilled therapeutic intervention in order to improve the following deficits and impairments:  Abnormal gait, Decreased activity tolerance, Decreased balance, Decreased coordination, Decreased endurance, Decreased mobility, Decreased range of motion, Decreased strength, Impaired tone, Impaired UE functional use, Obesity  Visit Diagnosis: Unsteadiness on feet  Muscle weakness (generalized)  Other abnormalities of gait and mobility  Other lack of coordination     Problem List Patient Active Problem List   Diagnosis Date Noted  . Slow transit constipation   . Neuropathic pain   . Stroke, acute, thrombotic ([place]) 10/08/2015  . Hemiparesis, aphasia, and dysphagia as late effect of cerebrovascular accident (CVA) ([place]) 10/08/2015  . Essential hypertension 10/08/2015  . HLD (hyperlipidemia) 10/08/2015  . Hemiplegia and hemiparesis following cerebral infarction affecting right dominant side ([place])   . Right hemiplegia ([place]) 10/06/2015  . Stroke (cerebrum) ([place]) 10/05/2015  . CVA (cerebral infarction) 10/05/2015    Willow Ora, PTA, [facility] [address], [place], [place] [address] [phone] 04/15/2016, 12:52 AM   Name: Sean Wilkerson MRN: [id number] Date of Birth:  [birth year]

## 2016-04-15 NOTE — Patient Instructions (Signed)
For these exercises:  Stand facing the counter top for support and have a chair behind you for safety.  Line your body up with the middle of the sink to ensure that you are putting weight on your R leg.     ABDUCTION: Standing (Active)    Stand, feet flat. Lift left leg out to side. Stand tall!!! Then do the right leg as best you can  (don't use your trunk to push the right leg out).  Complete _1__ sets of __10_ repetitions. Perform _1-2__ sessions per day.  http://gtsc.exer.us/110   Copyright  VHI. All rights reserved.   Extension: Half Squat    Squat, dropping hips back as if sitting in chair. Maintain knees over feet.  Don't use a weight (keep hands on the counter for support).   Repeat __10__ times per set. Do _1___ sets per session. Do _5-7___ sessions per week.  Copyright  VHI. All rights reserved.    Stand tall by the sink, make sure you are lined up with the middle.  I want you to step your R foot forward then "load" the leg (aim your hip for the counter and keep your R leg straight).  Do this 10 times, 1-2 times a day.

## 2016-04-16 ENCOUNTER — Encounter: Payer: Self-pay | Admitting: Occupational Therapy

## 2016-04-16 NOTE — Therapy (Signed)
[facility] [address] [place], [place], [address] Phone: [phone]   Fax:  [phone]  Occupational Therapy Treatment  Patient Details  Name: Sean Wilkerson MRN: [id number] Date of Birth: 1946-05-02 Referring Provider: Dr. Pearlean Brownie  Encounter Date: 04/15/2016      OT End of Session - 04/16/16 0858    Visit Number 11   Number of Visits 17   Date for OT Re-Evaluation 05/02/16   Authorization Type UMR MCR   Authorization - Visit Number 11   Authorization - Number of Visits 20   OT Start Time 1403   OT Stop Time 1447   OT Time Calculation (min) 44 min   Activity Tolerance Patient tolerated treatment well   Behavior During Therapy [facility] for tasks assessed/performed      Past Medical History  Diagnosis Date  . Hypertension   . HTN (hypertension) 10/08/2015  . HLD (hyperlipidemia) 10/08/2015  . Stroke [facility])     Past Surgical History  Procedure Laterality Date  . No past surgeries      There were no vitals filed for this visit.      Subjective Assessment - 04/16/16 0850    Subjective  I need you to look at the brace again.  I don't think we had it right.     Patient Stated Goals to be more indpendent   Currently in Pain? No/denies   Pain Score 0-No pain                      OT Treatments/Exercises (OP) - 04/16/16 0001    Transfers   Sit to Stand 4: Min guard   Sit to Stand Details (indicate cue type and reason) Cueing for foot placement and alignment in midline, and anterior weight shift   Stand to Sit 5: Supervision   ADLs   Functional Mobility Patient now able to static stand for 15 seconds with equal weight bearing through BLE's.  If standing becomes slightly dynamic, patient has little ability to adapt.  Need increased focused on dynamic stand balance and stand tolerance for overall ADL ability/independence / safety.  Patient stepping for stand step transfer without knee  hyperextension - using rolling walker and with mod cueing to control pace.     Neurological Re-education Exercises   Other Exercises 1 Neuromuscular reeducation to facilitate active trunk and scapula stabilizers.  Patient with improved ability to sustain muscle activation in trunk and shoulder in sidelying position.  Patient able to control trunk rotation in sidelying with min assist and cueing for correct activation.  In supine worked to activate elbow flexion in gravity assisted position.  Patient needs to visually attend and have significant time allowed to activate correct muscles.                  OT Education - 04/16/16 [phone]    Education provided Yes   Education Details reviewed splint donning technique with patient and wife.     Person(s) Educated Patient;Spouse   Methods Explanation;Demonstration   Comprehension Verbalized understanding;Returned demonstration;Tactile cues required;Verbal cues required          OT Short Term Goals - 04/13/16 1429    OT SHORT TERM GOAL #1   Title I with inital HEP.- check 04/03/16   Status Achieved   OT SHORT TERM GOAL #2   Title Pt will demonstrate understanding of RUE positioning to minimize pain and risk for injury including splint wear PRN.  Status Achieved   OT SHORT TERM GOAL #3   Title Pt will consistently donn shirt with min A.   Status Achieved   OT SHORT TERM GOAL #4   Title Pt will perform toilet transfers with min A.   Status Achieved   OT SHORT TERM GOAL #5   Title Pt will donn pants with min A using AE PRN.   Status Achieved   OT SHORT TERM GOAL #6   Title Pt will  perform shower transfer and bathe with min A.   Status On-going   OT SHORT TERM GOAL #7   Status Achieved           OT Long Term Goals - 03/09/16 1724    OT LONG TERM GOAL #1   Title Pt will donn shirt with set up only.- check 05/01/16   Time 8   Period Weeks   Status New   OT LONG TERM GOAL #2   Title Pt will donn pants with minguard for  balance.   Time 8   Period Weeks   Status New   OT LONG TERM GOAL #3   Title Pt will donn socks and shoes with min A.   Time 8   Period Weeks   Status New   OT LONG TERM GOAL #4   Title Pt will use RUE as a stabilizer for ADLS at least 25 % of the time with pain less than or equal to 3/10.   Time 8   Period Weeks   Status New   OT LONG TERM GOAL #5   Title Pt will demonstrate P/ROM shoulder flexion to 100* with pain no greater than 3/10.   Time 8   Period Weeks   Status New   OT LONG TERM GOAL #6   Title Pt will demonstrate ability to stand at countertop and retrieve an item from midlevel shelf with LUE, with supervision and no LOB.   Time 8   Period Weeks   Status New               Plan - 04/16/16 0859    Clinical Impression Statement Patient showing steady progress toward goals due to improved activity tolerance, static stand balance, control of right LE, scapular stabilization activation, and beginning movement inright UE.     Rehab Potential Good   Clinical Impairments Affecting Rehab Potential Potential for depression- will monitor and offer support, resources as warranted   OT Frequency 2x / week   OT Duration 8 weeks   OT Treatment/Interventions Self-care/ADL training;Therapeutic exercise;Patient/family education;Balance training;Splinting;Neuromuscular education;Ultrasound;Energy conservation;Therapeutic exercises;Therapeutic activities;DME and/or AE instruction;Parrafin;Cryotherapy;Electrical Stimulation;Fluidtherapy;Cognitive remediation/compensation;Visual/perceptual remediation/compensation;Passive range of motion;Contrast Bath;Moist Heat   Plan NMR RUE, Dynamic stand balance, functional mobility   Consulted and Agree with Plan of Care Patient   Family Member Consulted wife      Patient will benefit from skilled therapeutic intervention in order to improve the following deficits and impairments:  Decreased coordination, Decreased range of motion, Increased  edema, Decreased safety awareness, Decreased endurance, Decreased activity tolerance, Decreased knowledge of precautions, Impaired tone, Obesity, Pain, Impaired UE functional use, Decreased knowledge of use of DME, Decreased balance, Decreased cognition, Decreased mobility, Decreased strength, Impaired perceived functional ability, Impaired vision/preception  Visit Diagnosis: Unsteadiness on feet  Hemiplegia and hemiparesis following cerebral infarction affecting right non-dominant side (HCC)  Other lack of coordination  Muscle weakness (generalized)    Problem List Patient Active Problem List   Diagnosis Date Noted  . Slow transit constipation   .  Neuropathic pain   . Stroke, acute, thrombotic ([facility]) 10/08/2015  . 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 Essential hypertension 10/08/2015  . HLD (hyperlipidemia) 10/08/2015  . Hemiplegia and hemiparesis following cerebral infarction affecting right dominant side (HCC)   . Right hemiplegia (H[facility]) 10/06/2015  . Stroke (cerebrum) ([facility]) 10/05/2015  . CVA (cerebral infarction) 10/05/2015    Collier SalinaGellert, Kristin M, OTR/L 04/16/2016, 9:09 AM  [facility] [address] [place], [place], [address] Phone: [phone]   Fax:  [phone]  Name: Sean Wilkerson MRN: [id number] Date of Birth: 09-08-1946

## 2016-04-16 NOTE — Patient Instructions (Signed)
  Please complete the assigned speech therapy homework and return it to your next session.  

## 2016-04-16 NOTE — Therapy (Signed)
[facility] [address] [name], [place], [address] Phone: [phone]   Fax:  [phone]  Physical Therapy Treatment  Patient Details  Name: Sean Wilkerson MRN: [id number] Date of Birth: [birth year] Referring Provider: Dr. Antony Contras  Encounter Date: 04/15/2016      PT End of Session - 04/16/16 0924    Visit Number 12   Number of Visits 17   Date for PT Re-Evaluation 05/04/16   Authorization Type UHC Medicare   PT Start Time 1447   PT Stop Time 1533   PT Time Calculation (min) 46 min   Equipment Utilized During Treatment Gait belt   Activity Tolerance Patient limited by fatigue   Behavior During Therapy [facility] for tasks assessed/performed      Past Medical History  Diagnosis Date  . Hypertension   . HTN (hypertension) 10/08/2015  . HLD (hyperlipidemia) 10/08/2015  . Stroke [facility])     Past Surgical History  Procedure Laterality Date  . No past surgeries      There were no vitals filed for this visit.      Subjective Assessment - 04/16/16 0924    Subjective "I like this brace better than yall's brace."    Patient is accompained by: Family member   Pertinent History L CVA 10-05-15 - hospitalized until Jan - D/C'd home with [place], [place] finished up on 02-25-16   Patient Stated Goals "I want to walk without anything"   Currently in Pain? No/denies         NMR:  Addressed STG for HEP for NMR to RLE.  See pt instruction for details on exercises performed.  Wife and pt verbalized/demonstrated understanding.    Ortho fit train:  Ronalee Belts from [employer] present for part of session to provide pt with his personal R AFO.  Note marked improvement in R genu recurvatum during stance phase of gait.  Pt very happy with brace.  Provided education on skin checks with brace to prevent breakdown.  Pt and wife verbalized understanding.    Gait:  Ambulated x 40' with RW, R AFO and R hand orthosis  at min/guard to min A level (for facilitation) with cues and facilitation for upright posture, increased R hip protraction during R stance phase of gait, and cues for increased R and L step lengths.                          PT Education - 04/16/16 0924    Education provided Yes   Education Details Educated pt and wife on standing exercises at sink.    Person(s) Educated Patient;Spouse   Methods Explanation;Demonstration;Handout   Comprehension Verbalized understanding;Returned demonstration          PT Short Term Goals - 04/16/16 0926    PT SHORT TERM GOAL #1   Title Pt will perform basic transfers with S only - wheelchair to/from mat.  (04-05-16)   Baseline Min A with RW, AFO, and HO   Status Partially Met  progressing   PT SHORT TERM GOAL #2   Title Stand for at least 3" with intermittent UE support at counter without KAFO on RLE with SBA for incr. independence with ADL's.  (04-05-16)   Baseline 04/13/16: pt able to stand for >/= 3 minutes with intermittent support on walker with min guard assist to supervision   Status Achieved   PT SHORT TERM GOAL #3   Title  Amb. 150' with platform RW with CGA on flat, even surface.  (04-05-16)   Baseline 04/13/16: 52 with RW with right hand orthosis and AFO (bluerocker) with min guard assist.   Status Partially Met  progressing   PT SHORT TERM GOAL #4   Title Perform bed mobility including sit to/from supine with S.  (04-05-16)   Baseline mod to min assist required per wife's report   Status On-going   PT SHORT TERM GOAL #5   Title Independent in HEP for RLE strengthening.  (04-05-16)   Baseline reports compliance with bed level exercises 04/15/16   Time 4   Period Weeks   Status Achieved           PT Long Term Goals - 03/06/16 1917    PT LONG TERM GOAL #1   Title Modified independent with basic transfers.  (05-04-16)   Time 8   Period Weeks   Status New   PT LONG TERM GOAL #2   Title Modified independent with bed  mobility.  (05-04-16)   Time 8   Period Weeks   Status New   PT LONG TERM GOAL #3   Title Amb. 10' with hemiwalker with S for incr. household amb.  (05-04-16)   Time 8   Period Weeks   Status New   PT LONG TERM GOAL #4   Title Amb. 71' with hemiwalker with CGA for incr. community accessibility.  (05-04-16)   Time 8   Period Weeks   Status New   PT LONG TERM GOAL #5   Title Negotiate steps (4) with min assist with use of hand rail .  (05-04-16)   Time 8   Period Weeks   Status New   Additional Long Term Goals   Additional Long Term Goals Yes   PT LONG TERM GOAL #6   Title Perform Berg balance test when appropriate and establish goal as needed.  (05-04-16)   Time 8   Period Weeks   Status New               Plan - 04/16/16 7106    Clinical Impression Statement Skilled session focused on addressing HEP for STG.  Note that he has bed level exercises that he reports he is compliant with.  Added standing exercises today to increase challenge.  See pt instruction.  Note that Ronalee Belts from [employer] present during session for part of time to provide pt with his personal brace.     Rehab Potential Good   PT Frequency 2x / week   PT Duration 8 weeks   PT Treatment/Interventions ADLs/Self Care Home Management;DME Instruction;Balance training;Therapeutic exercise;Therapeutic activities;Functional mobility training;Stair training;Gait training;Neuromuscular re-education;Patient/family education;Orthotic Fit/Training;Passive range of motion   PT Next Visit Plan Assess bed mobility.  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 Slow transit constipation   .  Neuropathic pain   . Stroke, acute, thrombotic ([place]) 10/08/2015  . Hemiparesis, aphasia, and dysphagia as late effect of cerebrovascular accident (CVA) ([place]) 10/08/2015  . Essential hypertension 10/08/2015  . HLD (hyperlipidemia) 10/08/2015  . Hemiplegia and hemiparesis following cerebral infarction affecting right dominant side ([place])   . Right hemiplegia ([place]) 10/06/2015  . Stroke (cerebrum) ([place]) 10/05/2015  . CVA (cerebral infarction) 10/05/2015    Cameron Sprang, PT, MPT [facility] [address] [place], [place], [address] Phone: [phone]   Fax:  [phone] 04/16/2016, 9:28 AM  Name: Tadao Emig MRN: [id number] Date of Birth: Apr 06, 1946

## 2016-04-16 NOTE — Therapy (Signed)
[facility] [address] [place], [place], [address] Phone: [phone]   Fax:  [phone]  Speech Language Pathology Treatment  Patient Details  Name: Sean Wilkerson MRN: [id number] Date of Birth: 1946-06-06 Referring Provider: Antony Contras  Encounter Date: 04/15/2016      End of Session - 04/15/16 1326    Visit Number 7   Number of Visits 17   Date for SLP Re-Evaluation 05/14/16   SLP Start Time 62   SLP Stop Time  1400   SLP Time Calculation (min) 42 min   Activity Tolerance Patient tolerated treatment well      Past Medical History  Diagnosis Date  . Hypertension   . HTN (hypertension) 10/08/2015  . HLD (hyperlipidemia) 10/08/2015  . Stroke [facility])     Past Surgical History  Procedure Laterality Date  . No past surgeries      There were no vitals filed for this visit.      Subjective Assessment - 04/15/16 1327    Subjective Mod hoarseness and aphonia continue. If SLP not looking directly at patient, pt intelligibility was approx 35%. Pt tells SLP he is focusing on slower rate outside ST sessions, approx 50% of the time.   Currently in Pain? No/denies               ADULT SLP TREATMENT - 04/16/16 1118    General Information   Behavior/Cognition Alert;Cooperative;Pleasant mood   Cognitive-Linquistic Treatment   Treatment focused on Dysarthria   Skilled Treatment SLP facilitated pt's rate reduction in sentence responses. Simple to mod complex responses with a different rate reduction strategy. SLP and pt agreed that overarticulation along with "slowing down" were most helpful in improving intelligbility. With  sentence level and multisentence tasks pt was able to maintain slowed rate using overarticulation and reduced rate 75% of the time. Pt's error awareness incr'd slightly over the task however SLP had to cont to cue pt (minimally by end of session) for decr in intelligibility due to  faster rate of speech.   Assessment / Recommendations / Plan   Plan Continue with current plan of care   Progression Toward Goals   Progression toward goals Progressing toward goals          SLP Education - 04/16/16 1138    Education provided Yes   Education Details reduced rate needed to improve intelligibility   Person(s) Educated Patient   Methods Explanation;Demonstration;Verbal cues   Comprehension Verbalized understanding;Returned demonstration;Verbal cues required;Need further instruction          SLP Short Term Goals - 04/02/16 1458    SLP SHORT TERM GOAL #1   Title pt will demo 23/25 reps of EMST with proper procedure over 4 sessions   Status Not Met   SLP SHORT TERM GOAL #2   Title pt will demo oral-motor HEP with rare min A    Status Partially Met   SLP SHORT TERM GOAL #3   Title pt will demo 70dB average volume in 5 minutes simple conversation over three sessions   Status Not Met          SLP Long Term Goals - 04/16/16 1140    SLP [place] #1   Title pt will demo 22/25 reps of EMST with appropriate procedure over 4 sessions   Time 4   Period Weeks   Status Revised   SLP LONG TERM GOAL #2   Title pt will demo oral motor HEP with  rare min A over three sessions   Time 4   Period Weeks   Status Revised   SLP LONG TERM GOAL #3   Title pt will demo reduced rate of speech in 5 minutes simple conversation adequate for 95% intelligibility over 3 sessions   Time 4   Period Weeks   Status Revised          Plan - 04/16/16 1139    Clinical Impression Statement Pt presents with mod dysarthria continues with mod hoarse voice. Pt worked specificaly today on rate reductionand error awareness improved over the course of the session. Pt would cont to benefit from skilled ST addressing der'd intelligibility due to reduced breath support and reduced articulatory precision.   Speech Therapy Frequency 2x / week   Duration 4 weeks   Treatment/Interventions Oral  motor exercises;Compensatory strategies;SLP instruction and feedback;Patient/family education;Functional tasks;Cueing hierarchy   Potential to Achieve Goals Good   Potential Considerations Severity of impairments   Consulted and Agree with Plan of Care Patient      Patient will benefit from skilled therapeutic intervention in order to improve the following deficits and impairments:   Dysarthria and anarthria    Problem List Patient Active Problem List   Diagnosis Date Noted  . Slow transit constipation   . Neuropathic pain   . Stroke, acute, thrombotic ([place]) 10/08/2015  . Hemiparesis, aphasia, and dysphagia as late effect of cerebrovascular accident (CVA) ([place]) 10/08/2015  . Essential hypertension 10/08/2015  . HLD (hyperlipidemia) 10/08/2015  . Hemiplegia and hemiparesis following cerebral infarction affecting right dominant side ([place])   . Right hemiplegia ([place]) 10/06/2015  . Stroke (cerebrum) ([place]) 10/05/2015  . CVA (cerebral infarction) 10/05/2015    [facility] ,MS, [place]   04/16/2016, 11:40 AM  [facility] [address] [place], [place], [address] Phone: [phone]   Fax:  [phone]   Name: Sean Wilkerson MRN: [id number] Date of Birth: 1946-05-30

## 2016-04-19 ENCOUNTER — Ambulatory Visit: Payer: Medicare Other | Admitting: Occupational Therapy

## 2016-04-19 ENCOUNTER — Ambulatory Visit: Payer: Medicare Other | Attending: Neurology | Admitting: Physical Therapy

## 2016-04-19 ENCOUNTER — Ambulatory Visit: Payer: Medicare Other | Admitting: Speech Pathology

## 2016-04-19 DIAGNOSIS — I69353 Hemiplegia and hemiparesis following cerebral infarction affecting right non-dominant side: Secondary | ICD-10-CM

## 2016-04-19 DIAGNOSIS — R2681 Unsteadiness on feet: Secondary | ICD-10-CM | POA: Diagnosis present

## 2016-04-19 DIAGNOSIS — R471 Dysarthria and anarthria: Secondary | ICD-10-CM | POA: Insufficient documentation

## 2016-04-19 DIAGNOSIS — M6281 Muscle weakness (generalized): Secondary | ICD-10-CM | POA: Insufficient documentation

## 2016-04-19 DIAGNOSIS — R278 Other lack of coordination: Secondary | ICD-10-CM | POA: Insufficient documentation

## 2016-04-19 DIAGNOSIS — R2689 Other abnormalities of gait and mobility: Secondary | ICD-10-CM | POA: Insufficient documentation

## 2016-04-19 NOTE — Therapy (Signed)
[facility] [address] [place], [place], [address] Phone: [phone]   Fax:  [phone]  Speech Language Pathology Treatment  Patient Details  Name: Sean Wilkerson MRN: [id number] Date of Birth: 1946/06/12 Referring Provider: Antony Contras  Encounter Date: 04/19/2016      End of Session - 04/19/16 1417    Visit Number 8   Number of Visits 17   Date for SLP Re-Evaluation 05/14/16   SLP Start Time 1233   SLP Stop Time  1315   SLP Time Calculation (min) 42 min      Past Medical History  Diagnosis Date  . Hypertension   . HTN (hypertension) 10/08/2015  . HLD (hyperlipidemia) 10/08/2015  . Stroke [facility])     Past Surgical History  Procedure Laterality Date  . No past surgeries      There were no vitals filed for this visit.      Subjective Assessment - 04/19/16 1236    Subjective Pt remains moderately hoarse -    Currently in Pain? No/denies               ADULT SLP TREATMENT - 04/19/16 1237    General Information   Behavior/Cognition Alert;Cooperative;Pleasant mood   Treatment Provided   Treatment provided Cognitive-Linquistic   Cognitive-Linquistic Treatment   Treatment focused on Dysarthria   Skilled Treatment Compensations for dysarthria initially facilitated with structured repeitive tasks with SBA. Sentence level tasks of descriptives with occasional min cues for slow rate and over articulation . Loud /a/ to recalibrate loudness average of 84dB. Breath support with short 3 word utterances with focus on loudness reduced hoarseness with mod A.    Assessment / Recommendations / Plan   Plan Continue with current plan of care            SLP Short Term Goals - 04/19/16 1416    SLP SHORT TERM GOAL #1   Title pt will demo 23/25 reps of EMST with proper procedure over 4 sessions   Status Not Met   SLP SHORT TERM GOAL #2   Title pt will demo oral-motor HEP with rare min A    Status  Partially Met   SLP SHORT TERM GOAL #3   Title pt will demo 70dB average volume in 5 minutes simple conversation over three sessions   Status Not Met          SLP Long Term Goals - 04/19/16 1417    SLP LONG TERM GOAL #1   Title pt will demo 22/25 reps of EMST with appropriate procedure over 4 sessions   Time 3   Period Weeks   Status Revised   SLP LONG TERM GOAL #2   Title pt will demo oral motor HEP with rare min A over three sessions   Time 3   Period Weeks   Status Revised   SLP LONG TERM GOAL #3   Title pt will demo reduced rate of speech in 5 minutes simple conversation adequate for 95% intelligibility over 3 sessions   Time 3   Period Weeks   Status Revised          Plan - 04/19/16 1416    Clinical Impression Statement Pt presents with mod dysarthria continues with mod hoarse voice. Pt worked specificaly today on rate reductionand error awareness improved over the course of the session. Pt would cont to benefit from skilled ST addressing der'd intelligibility due to reduced breath support and reduced articulatory precision.  Speech Therapy Frequency 2x / week   Treatment/Interventions Oral motor exercises;Compensatory strategies;SLP instruction and feedback;Patient/family education;Functional tasks;Cueing hierarchy   Potential to Achieve Goals Good   Potential Considerations Severity of impairments      Patient will benefit from skilled therapeutic intervention in order to improve the following deficits and impairments:   Dysarthria and anarthria    Problem List Patient Active Problem List   Diagnosis Date Noted  . Slow transit constipation   . Neuropathic pain   . Stroke, acute, thrombotic ([place]) 10/08/2015  . Hemiparesis, aphasia, and dysphagia as late effect of cerebrovascular accident (CVA) ([place]) 10/08/2015  . Essential hypertension 10/08/2015  . HLD (hyperlipidemia) 10/08/2015  . Hemiplegia and hemiparesis following cerebral infarction affecting right  dominant side ([place])   . Right hemiplegia ([place]) 10/06/2015  . Stroke (cerebrum) ([place]) 10/05/2015  . CVA (cerebral infarction) 10/05/2015    [name], Annye Rusk MS, CCC-SLP 04/19/2016, 2:18 PM  [name] [address] [place], [place], [address] Phone: [phone]   Fax:  [phone]   Name: Sean Wilkerson MRN: [id number] Date of Birth: [birth year]

## 2016-04-19 NOTE — Therapy (Signed)
[facility] [address] [place], [place], [address] Phone: [phone]   Fax:  [phone]  Physical Therapy Treatment  Patient Details  Name: Sean Wilkerson MRN: [id number] Date of Birth: 01/07/1946 Referring Provider: Dr. Antony Contras  Encounter Date: 04/19/2016      PT End of Session - 04/19/16 1213    Visit Number 13   Number of Visits 17   Date for PT Re-Evaluation 05/04/16   Authorization Type [employer] Medicare   Authorization Time Period 03-05-16 - 05-04-16   PT Start Time 1103   PT Stop Time 1145   PT Time Calculation (min) 42 min   Activity Tolerance Patient limited by fatigue   Behavior During Therapy [facility] for tasks assessed/performed      Past Medical History  Diagnosis Date  . Hypertension   . HTN (hypertension) 10/08/2015  . HLD (hyperlipidemia) 10/08/2015  . Stroke [facility])     Past Surgical History  Procedure Laterality Date  . No past surgeries      There were no vitals filed for this visit.      Subjective Assessment - 04/19/16 1107    Subjective Pt arrived to session wearing new R AFO. Reports he "really likes it." When asked about getting into, OOB, pt reports he has a hospital bed and gets in by lying on L side first, then rolling onto back.   Pertinent History L CVA 10-05-15 - hospitalized until Jan - D/C'd home with [place], [place] [facility] finished up on 02-25-16   Patient Stated Goals "I want to walk without anything"   Currently in Pain? No/denies                         [facility] Adult PT Treatment/Exercise - 04/19/16 0001    Bed Mobility   Bed Mobility Supine to Sit;Sit to Supine   Supine to Sit 4: Min assist   Supine to Sit Details (indicate cue type and reason) Initially attempted supine > sit via L sidelying to simulate home environment. Pt required manual assist to initiate/maintain R hip/knee flexion to initiate rolling from supine > L sidelying. Pt  able to use LUE to assist RUE across midline; cueing required for L cervical rotation to initiate rolling to L. Pt with effective return demo of technique; however, required manual assist for RLE management only during subsequent trials. Also attempted supine > sit via R sidelying: pt able to better intiate supine > R sidelying, but required assist to transition from R sidelying > sit due to R hemiplegia.   Sit to Supine 5: Supervision;5: Set up   Sit to Supine - Details (indicate cue type and reason) Initially performed sit > supine via L sidelying (to simualte setup of hospital bed at home): pt able to perform with (S), no cueing required. Then attempted sit > supine via R sidelying by managing RLE using LLE and transitioning straight into supine.   Transfers   Transfers Sit to Stand;Stand to Sit   Sit to Stand 5: Supervision   Sit to Stand Details (indicate cue type and reason) (S) for stability/balance while pt managing RUE   Stand to Sit 5: Supervision   Stand to Sit Details (S) for stability/balance while pt managing RUE   Stand Pivot Transfers 4: Min guard   Stand Pivot Transfer Details (indicate cue type and reason) using RW with R h/o; requires cueing for weight shifting, RLE during  pivoting   Ambulation/Gait   Ambulation/Gait Yes   Ambulation/Gait Assistance 5: Supervision;4: Min guard   Ambulation/Gait Assistance Details Gait x60' then x55' (seated rest between trials) with RW, R h/o, and R AFO. Cueing provided to address posterior rotation of trunk to R side, to promote R pelvic protraction and anterior weight shift during LLE advancement, for increased LLE step length, and to improve R foot placement (decreased RLE ADD). PT provided only subtle cueing during second trial; noted pt with effective within-session carryover of correction of trunk rotation.   Ambulation Distance (Feet) 60 Feet  then 64'   Assistive device Rolling walker  R h/o, R AFO   Gait Pattern Decreased hip/knee  flexion - right;Decreased stance time - right;Decreased weight shift to right;Decreased step length - left;Trunk rotated posteriorly on right;Step-through pattern;Decreased arm swing - right  RLE adduction; progressed to step-through pattern   Ambulation Surface Level;Indoor   Gait Comments Noted only 1 episode of R genu recurvatum during this session..                PT Education - 04/19/16 1153    Education provided Yes   Education Details [place] technique for getting OOB.    Person(s) Educated Patient   Methods Explanation;Demonstration;Verbal cues   Comprehension Verbalized understanding;Returned demonstration          PT Short Term Goals - 04/19/16 1218    PT SHORT TERM GOAL #1   Title Pt will perform basic transfers with S only - wheelchair to/from mat.  (04-05-16)   Baseline Min A with RW, AFO, and HO   Status Partially Met  progressing   PT SHORT TERM GOAL #2   Title Stand for at least 3" with intermittent UE support at counter without KAFO on RLE with SBA for incr. independence with ADL's.  (04-05-16)   Baseline 04/13/16: pt able to stand for >/= 3 minutes with intermittent support on walker with min guard assist to supervision   Status Achieved   PT SHORT TERM GOAL #3   Title Amb. 150' with platform RW with CGA on flat, even surface.  (04-05-16)   Baseline 04/13/16: 52 with RW with right hand orthosis and AFO (bluerocker) with min guard assist.   Status Partially Met  progressing   PT SHORT TERM GOAL #4   Title Perform bed mobility including sit to/from supine with S.  (04-05-16)   Baseline 7/3: (S) for sit > supine; min A for supine > sit   Status Partially Met   PT SHORT TERM GOAL #5   Title Independent in HEP for RLE strengthening.  (04-05-16)   Baseline reports compliance with bed level exercises 04/15/16   Time 4   Period Weeks   Status Achieved           PT Long Term Goals - 03/06/16 1917    PT LONG TERM GOAL #1   Title Modified independent with  basic transfers.  (05-04-16)   Time 8   Period Weeks   Status New   PT LONG TERM GOAL #2   Title Modified independent with bed mobility.  (05-04-16)   Time 8   Period Weeks   Status New   PT LONG TERM GOAL #3   Title Amb. 43' with hemiwalker with S for incr. household amb.  (05-04-16)   Time 8   Period Weeks   Status New   PT LONG TERM GOAL #4   Title Amb. 76' with hemiwalker with CGA for incr.  community accessibility.  (05-04-16)   Time 8   Period Weeks   Status New   PT LONG TERM GOAL #5   Title Negotiate steps (4) with min assist with use of hand rail .  (05-04-16)   Time 8   Period Weeks   Status New   Additional Long Term Goals   Additional Long Term Goals Yes   PT LONG TERM GOAL #6   Title Perform Berg balance test when appropriate and establish goal as needed.  (05-04-16)   Time 8   Period Weeks   Status New               Plan - 04/19/16 1214    Clinical Impression Statement Session focused on increasing pt independence with bed mobility and gait training with new R AFO. Hospital bed at home oriented such that pt rolls to/from L side to get out of/into bed. Requires (S) for sit > supine; with supine > sit, however, requires min assist for RLE management. During gait training, R genu recurvatum well-controlled by new R AFO and pt close (S) to min guard for gait up to 60'. Examined skin post-session with no signs of excessive pressure under R AFO.   Rehab Potential Good   PT Frequency 2x / week   PT Duration 8 weeks   PT Treatment/Interventions ADLs/Self Care Home Management;DME Instruction;Balance training;Therapeutic exercise;Therapeutic activities;Functional mobility training;Stair training;Gait training;Neuromuscular re-education;Patient/family education;Orthotic Fit/Training;Passive range of motion   PT Next Visit Plan Continue to address bed mobility. Continue R NMR, WB activities, RLE weightshifting, gait training-go over gait with wife/pt with use of R HO    Consulted and Agree with Plan of Care Patient      Patient will benefit from skilled therapeutic intervention in order to improve the following deficits and impairments:  Abnormal gait, Decreased activity tolerance, Decreased balance, Decreased coordination, Decreased endurance, Decreased mobility, Decreased range of motion, Decreased strength, Impaired tone, Impaired UE functional use, Obesity  Visit Diagnosis: Hemiplegia and hemiparesis following cerebral infarction affecting right non-dominant side (HCC)  Other abnormalities of gait and mobility     Problem List Patient Active Problem List   Diagnosis Date Noted  . Slow transit constipation   . Neuropathic pain   . Stroke, acute, thrombotic ([place]) 10/08/2015  . Hemiparesis, aphasia, and dysphagia as late effect of cerebrovascular accident (CVA) ([place]) 10/08/2015  . Essential hypertension 10/08/2015  . HLD (hyperlipidemia) 10/08/2015  . Hemiplegia and hemiparesis following cerebral infarction affecting right dominant side ([place])   . Right hemiplegia ([place]) 10/06/2015  . Stroke (cerebrum) ([place]) 10/05/2015  . CVA (cerebral infarction) 10/05/2015    Billie Ruddy, PT, DPT [facility] [address] [place], [place], [address] Phone: [phone]   Fax:  [phone] 04/19/2016, 12:19 PM  Name: Tayvian Holycross MRN: [id number] Date of Birth: [birth year]

## 2016-04-19 NOTE — Therapy (Signed)
[facility] [address] [place], [place], [address] Phone: [phone]   Fax:  [phone]  Occupational Therapy Treatment  Patient Details  Name: Sean BirchwoodLarry Lee Wilkerson MRN: [id number] Date of Birth: 06/25/1946 Referring Provider: Dr. Pearlean BrownieSethi  Encounter Date: 04/19/2016    Past Medical History  Diagnosis Date  . Hypertension   . HTN (hypertension) 10/08/2015  . HLD (hyperlipidemia) 10/08/2015  . Stroke [facility](HCC)     Past Surgical History  Procedure Laterality Date  . No past surgeries      There were no vitals filed for this visit.      Subjective Assessment - 04/19/16 1720    Subjective  denies pain   Pertinent History see Epic   Patient Stated Goals to be more indpendent   Currently in Pain? No/denies              Neurological Re-education Exercises  Other Exercises 1 Neuromuscular reeducation to facilitate active trunk in sidelying.  Patient performed weightbearing through right elbow with lateral trunk flexion, in seated,  mod facilitation.   In seated weightbearing through RUE over dome, while performing reaching and lateral weight shift to left side. Weightbearing through tilted stool for AA/ROM and increased RUE activation , mod/ max facilitation.                      OT Short Term Goals - 04/13/16 1429    OT SHORT TERM GOAL #1   Title I with inital HEP.- check 04/03/16   Status Achieved   OT SHORT TERM GOAL #2   Title Pt will demonstrate understanding of RUE positioning to minimize pain and risk for injury including splint wear PRN.   Status Achieved   OT SHORT TERM GOAL #3   Title Pt will consistently donn shirt with min A.   Status Achieved   OT SHORT TERM GOAL #4   Title Pt will perform toilet transfers with min A.   Status Achieved   OT SHORT TERM GOAL #5   Title Pt will donn pants with min A using AE PRN.   Status Achieved   OT SHORT TERM GOAL #6   Title Pt will   perform shower transfer and bathe with min A.   Status On-going   OT SHORT TERM GOAL #7   Status Achieved           OT Long Term Goals - 03/09/16 1724    OT LONG TERM GOAL #1   Title Pt will donn shirt with set up only.- check 05/01/16   Time 8   Period Weeks   Status New   OT LONG TERM GOAL #2   Title Pt will donn pants with minguard for balance.   Time 8   Period Weeks   Status New   OT LONG TERM GOAL #3   Title Pt will donn socks and shoes with min A.   Time 8   Period Weeks   Status New   OT LONG TERM GOAL #4   Title Pt will use RUE as a stabilizer for ADLS at least 25 % of the time with pain less than or equal to 3/10.   Time 8   Period Weeks   Status New   OT LONG TERM GOAL #5   Title Pt will demonstrate P/ROM shoulder flexion to 100* with pain no greater than 3/10.   Time 8   Period Weeks   Status New   OT  LONG TERM GOAL #6   Title Pt will demonstrate ability to stand at countertop and retrieve an item from midlevel shelf with LUE, with supervision and no LOB.   Time 8   Period Weeks   Status New               Plan - 04/19/16 1721    Clinical Impression Statement Pt is progressing towards goals with beginning activation in RUE and improved trunk control.   Rehab Potential Good   Clinical Impairments Affecting Rehab Potential Potential for depression- will monitor and offer support, resources as warranted   OT Frequency 2x / week   OT Duration 8 weeks   OT Treatment/Interventions Self-care/ADL training;Therapeutic exercise;Patient/family education;Balance training;Splinting;Neuromuscular education;Ultrasound;Energy conservation;Therapeutic exercises;Therapeutic activities;DME and/or AE instruction;Parrafin;Cryotherapy;Electrical Stimulation;Fluidtherapy;Cognitive remediation/compensation;Visual/perceptual remediation/compensation;Passive range of motion;Contrast Bath;Moist Heat   Plan Dynamic standing balance, NMR RUE   Consulted and Agree with Plan of  Care Patient      Patient will benefit from skilled therapeutic intervention in order to improve the following deficits and impairments:  Decreased coordination, Decreased range of motion, Increased edema, Decreased safety awareness, Decreased endurance, Decreased activity tolerance, Decreased knowledge of precautions, Impaired tone, Obesity, Pain, Impaired UE functional use, Decreased knowledge of use of DME, Decreased balance, Decreased cognition, Decreased mobility, Decreased strength, Impaired perceived functional ability, Impaired vision/preception  Visit Diagnosis: Hemiplegia and hemiparesis following cerebral infarction affecting right non-dominant side (HCC)  Other lack of coordination  Muscle weakness (generalized)    Problem List Patient Active Problem List   Diagnosis Date Noted  . Slow transit constipation   . Neuropathic pain   . Stroke, acute, thrombotic (HCC) 10/08/2015  . 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 Essential hypertension 10/08/2015  . HLD (hyperlipidemia) 10/08/2015  . Hemiplegia and hemiparesis following cerebral infarction affecting right dominant side (HCC)   . Right hemiplegia (HCC) 10/06/2015  . Stroke (cerebrum) (HCC) 10/05/2015  . CVA (cerebral infarction) 10/05/2015    Fayette Gasner 04/19/2016, 5:22 PM Keene BreathKathryn Lonell Stamos, OTR/L [phone] Phone: [phone] 5:22 PM 04/19/2016       [facility] [address] [place], [place], [address] Phone: [phone]   Fax:  [phone]  Name: Sean BirchwoodLarry Lee Wilkerson MRN: [id number] Date of Birth: 04-Feb-1946

## 2016-04-22 ENCOUNTER — Ambulatory Visit: Payer: Medicare Other | Admitting: Speech Pathology

## 2016-04-22 ENCOUNTER — Ambulatory Visit: Payer: Medicare Other | Admitting: Occupational Therapy

## 2016-04-22 ENCOUNTER — Ambulatory Visit: Payer: Medicare Other

## 2016-04-22 ENCOUNTER — Encounter: Payer: Self-pay | Admitting: Occupational Therapy

## 2016-04-22 DIAGNOSIS — M6281 Muscle weakness (generalized): Secondary | ICD-10-CM

## 2016-04-22 DIAGNOSIS — R2681 Unsteadiness on feet: Secondary | ICD-10-CM

## 2016-04-22 DIAGNOSIS — R278 Other lack of coordination: Secondary | ICD-10-CM

## 2016-04-22 DIAGNOSIS — R471 Dysarthria and anarthria: Secondary | ICD-10-CM

## 2016-04-22 DIAGNOSIS — I69353 Hemiplegia and hemiparesis following cerebral infarction affecting right non-dominant side: Secondary | ICD-10-CM

## 2016-04-22 DIAGNOSIS — R2689 Other abnormalities of gait and mobility: Secondary | ICD-10-CM

## 2016-04-22 NOTE — Therapy (Signed)
[facility] [address] [place], [place], [address] Phone: [phone]   Fax:  [phone]  Physical Therapy Treatment  Patient Details  Name: Sean Wilkerson MRN: [id number] Date of Birth: 1946/01/15 Referring Provider: Dr. Antony Contras  Encounter Date: 04/22/2016      PT End of Session - 04/22/16 2007    Visit Number 14   Number of Visits 17   Date for PT Re-Evaluation 05/04/16   Authorization Type [employer] Medicare   Authorization Time Period 03-05-16 - 05-04-16   PT Start Time 1316   PT Stop Time 1400   PT Time Calculation (min) 44 min   Equipment Utilized During Treatment Gait belt   Activity Tolerance Patient tolerated treatment well   Behavior During Therapy [facility] for tasks assessed/performed      Past Medical History  Diagnosis Date  . Hypertension   . HTN (hypertension) 10/08/2015  . HLD (hyperlipidemia) 10/08/2015  . Stroke [facility])     Past Surgical History  Procedure Laterality Date  . No past surgeries      There were no vitals filed for this visit.      Subjective Assessment - 04/22/16 1321    Subjective Pt denied falls or changes since last session.   Pertinent History L CVA 10-05-15 - hospitalized until Jan - D/C'd home with [place], [facility] finished up on 02-25-16   Patient Stated Goals "I want to walk without anything"   Currently in Pain? No/denies                         [facility] Adult PT Treatment/Exercise - 04/22/16 1321    Ambulation/Gait   Ambulation/Gait Yes   Ambulation/Gait Assistance 4: Min guard;4: Min assist   Ambulation/Gait Assistance Details Cues to improve, R hip protraction, upright posture and improve L step length and stride length. Pt required seated rest breaks after amb. 2/2 fatigue. PT and rehab tech adjusted R h/o on RW as it slipped during gait.   Ambulation Distance (Feet) 115 Feet  x2 and 10' in // bars.   Assistive device  Rolling walker;Parallel bars  with R h/o   Gait Pattern Decreased hip/knee flexion - right;Decreased stance time - right;Decreased weight shift to right;Decreased step length - left;Trunk rotated posteriorly on right;Step-through pattern   Ambulation Surface Level;Indoor   Gait Comments R genu recurvatum improved today, only 2 episodes noted during last 40' of amb. 2/2 fatigue.                  PT Short Term Goals - 04/19/16 1218    PT SHORT TERM GOAL #1   Title Pt will perform basic transfers with S only - wheelchair to/from mat.  (04-05-16)   Baseline Min A with RW, AFO, and HO   Status Partially Met  progressing   PT SHORT TERM GOAL #2   Title Stand for at least 3" with intermittent UE support at counter without KAFO on RLE with SBA for incr. independence with ADL's.  (04-05-16)   Baseline 04/13/16: pt able to stand for >/= 3 minutes with intermittent support on walker with min guard assist to supervision   Status Achieved   PT SHORT TERM GOAL #3   Title Amb. 150' with platform RW with CGA on flat, even surface.  (04-05-16)   Baseline 04/13/16: 52 with RW with right hand orthosis and AFO (bluerocker) with min guard assist.  Status Partially Met  progressing   PT SHORT TERM GOAL #4   Title Perform bed mobility including sit to/from supine with S.  (04-05-16)   Baseline 7/3: (S) for sit > supine; min A for supine > sit   Status Partially Met   PT SHORT TERM GOAL #5   Title Independent in HEP for RLE strengthening.  (04-05-16)   Baseline reports compliance with bed level exercises 04/15/16   Time 4   Period Weeks   Status Achieved           PT Long Term Goals - 03/06/16 1917    PT LONG TERM GOAL #1   Title Modified independent with basic transfers.  (05-04-16)   Time 8   Period Weeks   Status New   PT LONG TERM GOAL #2   Title Modified independent with bed mobility.  (05-04-16)   Time 8   Period Weeks   Status New   PT LONG TERM GOAL #3   Title Amb. 41' with  hemiwalker with S for incr. household amb.  (05-04-16)   Time 8   Period Weeks   Status New   PT LONG TERM GOAL #4   Title Amb. 78' with hemiwalker with CGA for incr. community accessibility.  (05-04-16)   Time 8   Period Weeks   Status New   PT LONG TERM GOAL #5   Title Negotiate steps (4) with min assist with use of hand rail .  (05-04-16)   Time 8   Period Weeks   Status New   Additional Long Term Goals   Additional Long Term Goals Yes   PT LONG TERM GOAL #6   Title Perform Berg balance test when appropriate and establish goal as needed.  (05-04-16)   Time [place]   Status New               Plan - 04/22/16 2008    Clinical Impression Statement Pt demonstrated progress, as he was able to amb. longer distances today. Pt did experience one LOB episode which required mod A to correct, however, LOB likely due to new cues to contract glute max/med during R SLS. Pt would continue to benefit from skilled PT to improve safety during functional mobililty.   Rehab Potential Good   PT Frequency 2x / week   PT Duration 8 weeks   PT Treatment/Interventions ADLs/Self Care Home Management;DME Instruction;Balance training;Therapeutic exercise;Therapeutic activities;Functional mobility training;Stair training;Gait training;Neuromuscular re-education;Patient/family education;Orthotic Fit/Training;Passive range of motion   PT Next Visit Plan Continue to address bed mobility. Continue R NMR, WB activities, RLE weightshifting, gait training-go over gait with wife/pt with use of R HO (wife not present during last session).   PT Home Exercise Plan RLE strengthening   Consulted and Agree with Plan of Care Patient      Patient will benefit from skilled therapeutic intervention in order to improve the following deficits and impairments:  Abnormal gait, Decreased activity tolerance, Decreased balance, Decreased coordination, Decreased endurance, Decreased mobility, Decreased range of motion,  Decreased strength, Impaired tone, Impaired UE functional use, Obesity  Visit Diagnosis: Hemiplegia and hemiparesis following cerebral infarction affecting right non-dominant side (HCC)  Other lack of coordination  Other abnormalities of gait and mobility     Problem List Patient Active Problem List   Diagnosis Date Noted  . Slow transit constipation   . Neuropathic pain   . Stroke, acute, thrombotic ([place]) 10/08/2015  . Hemiparesis, aphasia, and dysphagia as late  effect of cerebrovascular accident (CVA) ([place]) 10/08/2015  . Essential hypertension 10/08/2015  . HLD (hyperlipidemia) 10/08/2015  . Hemiplegia and hemiparesis following cerebral infarction affecting right dominant side ([place])   . Right hemiplegia ([place]) 10/06/2015  . Stroke (cerebrum) ([place]) 10/05/2015  . CVA (cerebral infarction) 10/05/2015    Bearl Talarico L 04/22/2016, 8:10 PM  [facility] [address] [place], [place], [address] Phone: [phone]   Fax:  [phone]  Name: Sean Wilkerson MRN: [id number] Date of Birth: January 08, 1946    Geoffry Paradise, PT,DPT 04/22/2016 8:10 PM Phone: [phone] Fax: [phone]

## 2016-04-22 NOTE — Therapy (Signed)
[facility] [address] [place], [place], [address] Phone: [phone]   Fax:  [phone]  Occupational Therapy Treatment  Patient Details  Name: Sean Wilkerson MRN: [id number] Date of Birth: 08-Mar-1946 Referring Provider: Dr. Pearlean BrownieSethi  Encounter Date: 04/22/2016      OT End of Session - 04/22/16 1645    Visit Number 13   Number of Visits 17   Date for OT Re-Evaluation 05/02/16   Authorization Type UMR MCR   Authorization - Visit Number 13   Authorization - Number of Visits 20   OT Start Time 1404   OT Stop Time 1446   OT Time Calculation (min) 42 min   Activity Tolerance Patient tolerated treatment well   Behavior [facility] for tasks assessed/performed      Past Medical History  Diagnosis Date  . Hypertension   . HTN (hypertension) 10/08/2015  . HLD (hyperlipidemia) 10/08/2015  . Stroke [facility](HCC)     Past Surgical History  Procedure Laterality Date  . No past surgeries      There were no vitals filed for this visit.      Subjective Assessment - 04/22/16 1625    Subjective  Patient inquiring about his progress - reinforced with him that he is making steady progress   Patient is accompained by: Family member   Pertinent History see Epic   Patient Stated Goals to be more indpendent   Currently in Pain? No/denies   Pain Score 0-No pain                      OT Treatments/Exercises (OP) - 04/22/16 0001    ADLs   UB Dressing Wife indicates that she is now allowing patient to fasten his own buttons now for UB drressing.     ADL Comments Encouraged wife and patient to continue to increase the amount of self care patient is doing without assistance   Neurological Re-education Exercises   Other Exercises 1 Neuromuscular reeducation in conjunction with estim to right forearm.  Able to facilitate both wrist and digit flexion and extension today - prior attempt, only able to achieve  extension.  Patient with only trace movement following stimulation to right forearm.     Programme researcher, broadcasting/film/videolectrical Stimulation   Electrical Stimulation Location right forearm   Electrical Stimulation Action wrist/digit extension and flexion   Electrical Stimulation Parameters on/off 10 seconds, 50pps, ramp time 2 sec, pulse width 250 x 10 min flexion and 10 min extension                OT Education - 04/22/16 1645    Education provided Yes   Education Details reviewed progress with functional mobility, ADL, and RUE movement   Person(s) Educated Patient;Spouse   Methods Explanation   Comprehension Verbalized understanding          OT Short Term Goals - 04/13/16 1429    OT SHORT TERM GOAL #1   Title I with inital HEP.- check 04/03/16   Status Achieved   OT SHORT TERM GOAL #2   Title Pt will demonstrate understanding of RUE positioning to minimize pain and risk for injury including splint wear PRN.   Status Achieved   OT SHORT TERM GOAL #3   Title Pt will consistently donn shirt with min A.   Status Achieved   OT SHORT TERM GOAL #4   Title Pt will perform toilet transfers with min A.   Status Achieved  OT SHORT TERM GOAL #5   Title Pt will donn pants with min A using AE PRN.   Status Achieved   OT SHORT TERM GOAL #6   Title Pt will  perform shower transfer and bathe with min A.   Status On-going   OT SHORT TERM GOAL #7   Status Achieved           OT Long Term Goals - 03/09/16 1724    OT LONG TERM GOAL #1   Title Pt will donn shirt with set up only.- check 05/01/16   Time 8   Period Weeks   Status New   OT LONG TERM GOAL #2   Title Pt will donn pants with minguard for balance.   Time 8   Period Weeks   Status New   OT LONG TERM GOAL #3   Title Pt will donn socks and shoes with min A.   Time 8   Period Weeks   Status New   OT LONG TERM GOAL #4   Title Pt will use RUE as a stabilizer for ADLS at least 25 % of the time with pain less than or equal to 3/10.   Time 8    Period Weeks   Status New   OT LONG TERM GOAL #5   Title Pt will demonstrate P/ROM shoulder flexion to 100* with pain no greater than 3/10.   Time 8   Period Weeks   Status New   OT LONG TERM GOAL #6   Title Pt will demonstrate ability to stand at countertop and retrieve an item from midlevel shelf with LUE, with supervision and no LOB.   Time 8   Period Weeks   Status New               Plan - 04/22/16 1646    Clinical Impression Statement Patient is showing steady improvement with functional mobility, ADL participation, and RUE activation.     Rehab Potential Good   Clinical Impairments Affecting Rehab Potential Potential for depression- will monitor and offer support, resources as warranted   OT Frequency 2x / week   OT Duration 8 weeks   OT Treatment/Interventions Self-care/ADL training;Therapeutic exercise;Patient/family education;Balance training;Splinting;Neuromuscular education;Ultrasound;Energy conservation;Therapeutic exercises;Therapeutic activities;DME and/or AE instruction;Parrafin;Cryotherapy;Electrical Stimulation;Fluidtherapy;Cognitive remediation/compensation;Visual/perceptual remediation/compensation;Passive range of motion;Contrast Bath;Moist Heat   Plan dynamic stand balance, NMR RUE   Consulted and Agree with Plan of Care Patient   Family Member Consulted wife      Patient will benefit from skilled therapeutic intervention in order to improve the following deficits and impairments:  Decreased coordination, Decreased range of motion, Increased edema, Decreased safety awareness, Decreased endurance, Decreased activity tolerance, Decreased knowledge of precautions, Impaired tone, Obesity, Pain, Impaired UE functional use, Decreased knowledge of use of DME, Decreased balance, Decreased cognition, Decreased mobility, Decreased strength, Impaired perceived functional ability, Impaired vision/preception  Visit Diagnosis: Hemiplegia and hemiparesis following cerebral  infarction affecting right non-dominant side (HCC)  Other lack of coordination  Muscle weakness (generalized)  Unsteadiness on feet    Problem List Patient Active Problem List   Diagnosis Date Noted  . Slow transit constipation   . Neuropathic pain   . Stroke, acute, thrombotic (HCC) 10/08/2015  . 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 Essential hypertension 10/08/2015  . HLD (hyperlipidemia) 10/08/2015  . Hemiplegia and hemiparesis following cerebral infarction affecting right dominant side (HCC)   . Right hemiplegia (HCC) 10/06/2015  . Stroke (cerebrum) (HCC) 10/05/2015  .  CVA (cerebral infarction) 10/05/2015    Collier Salina, OTR/L 04/22/2016, 4:48 PM  [facility] [address] [place], [place], [address] Phone: [phone]   Fax:  [phone]  Name: Sean Wilkerson MRN: [id number] Date of Birth: [birth year]

## 2016-04-22 NOTE — Patient Instructions (Signed)
  Be aware of background noise you are trying to talk over - mute the TV, if appliances or fans are loud, move to another room.  Look face to face with the person you are talking to.   Reduce tension in your voice box, massaging down on either side of voice box, working out to sides of throat and back in.   Stretch your neck gently around.

## 2016-04-22 NOTE — Therapy (Signed)
[facility] [address] [place], [place], [address] Phone: [phone]   Fax:  [phone]  Speech Language Pathology Treatment  Patient Details  Name: Sean Wilkerson MRN: [id number] Date of Birth: [birth year] Referring Provider: Antony Contras  Encounter Date: 04/22/2016      End of Session - 04/22/16 1507    Visit Number 9   Number of Visits 17   Date for SLP Re-Evaluation 05/14/16   SLP Start Time 1232   SLP Stop Time  1314   SLP Time Calculation (min) 42 min      Past Medical History  Diagnosis Date  . Hypertension   . HTN (hypertension) 10/08/2015  . HLD (hyperlipidemia) 10/08/2015  . Stroke [facility])     Past Surgical History  Procedure Laterality Date  . No past surgeries      There were no vitals filed for this visit.      Subjective Assessment - 04/22/16 1242    Subjective Hoarse voice continues   Currently in Pain? No/denies               ADULT SLP TREATMENT - 04/22/16 1243    General Information   Behavior/Cognition Alert;Cooperative;Pleasant mood   Treatment Provided   Treatment provided Cognitive-Linquistic   Cognitive-Linquistic Treatment   Treatment focused on Dysarthria   Skilled Treatment Compensations for dysarthria facilitated with recalibrating volume with loud /a/ average of 75dB.  Slow rate and over articulation in structured tasks with occasional min A as pt rate of speech increases as task progressed. Dyphonia remains and continues to affect itellgibility. I attempted laryngeal massage  and gentle neck stretches to reduce tension dysphonia  without change in voice. EMST completed accurately with 23/25  reps.   Assessment / Recommendations / Plan   Plan Continue with current plan of care   Progression Toward Goals   Progression toward goals Progressing toward goals          SLP Education - 04/22/16 1504    Education provided Yes   Education Details environmental  compensations for dysarthria,    Person(s) Educated Patient   Methods Explanation;Demonstration;Handout   Comprehension Verbalized understanding;Verbal cues required          SLP Short Term Goals - 04/22/16 1506    SLP SHORT TERM GOAL #1   Title pt will demo 23/25 reps of EMST with proper procedure over 4 sessions   Status Not Met   SLP SHORT TERM GOAL #2   Title pt will demo oral-motor HEP with rare min A    Status Partially Met   SLP SHORT TERM GOAL #3   Title pt will demo 70dB average volume in 5 minutes simple conversation over three sessions   Status Not Met          SLP Long Term Goals - 04/22/16 1506    SLP LONG TERM GOAL #1   Title pt will demo 22/25 reps of EMST with appropriate procedure over 4 sessions   Time 3   Period Weeks   Status Revised   SLP LONG TERM GOAL #2   Title pt will demo oral motor HEP with rare min A over three sessions   Time 3   Period Weeks   Status Revised   SLP LONG TERM GOAL #3   Title pt will demo reduced rate of speech in 5 minutes simple conversation adequate for 95% intelligibility over 3 sessions   Time 3   Period Weeks  Status Revised          Plan - 04/22/16 1504    Clinical Impression Statement 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 Pt continues to require min A for carryover of slow rate - continue skilled ST to maximize intellgibility and carryover of compensations for dysarthria.   Speech Therapy Frequency 2x / week   Duration --  3 weeks   Treatment/Interventions Oral motor exercises;Compensatory strategies;SLP instruction and feedback;Patient/family education;Functional tasks;Cueing hierarchy   Potential to Achieve Goals Good   Potential Considerations Severity of impairments      Patient will benefit from skilled therapeutic intervention in order to improve the following deficits and impairments:   Dysarthria and anarthria    Problem List Patient Active Problem List    Diagnosis Date Noted  . Slow transit constipation   . Neuropathic pain   . Stroke, acute, thrombotic ([place]) 10/08/2015  . Hemiparesis, aphasia, and dysphagia as late effect of cerebrovascular accident (CVA) ([place]) 10/08/2015  . Essential hypertension 10/08/2015  . HLD (hyperlipidemia) 10/08/2015  . Hemiplegia and hemiparesis following cerebral infarction affecting right dominant side ([place])   . Right hemiplegia ([place]) 10/06/2015  . Stroke (cerebrum) ([place]) 10/05/2015  . CVA (cerebral infarction) 10/05/2015    Florencio Hollibaugh, Annye Rusk  MS, CCC-SLP  04/22/2016, 3:08 PM  [facility] [address] [place], [place], [address] Phone: [phone]   Fax:  [phone]   Name: Nawaf Strange MRN: [id number] Date of Birth: [birth year]

## 2016-04-27 ENCOUNTER — Ambulatory Visit: Payer: Medicare Other

## 2016-04-27 ENCOUNTER — Ambulatory Visit: Payer: Medicare Other | Admitting: Occupational Therapy

## 2016-04-27 ENCOUNTER — Encounter: Payer: Self-pay | Admitting: Occupational Therapy

## 2016-04-27 VITALS — BP 131/72 | HR 64

## 2016-04-27 DIAGNOSIS — R2681 Unsteadiness on feet: Secondary | ICD-10-CM

## 2016-04-27 DIAGNOSIS — I69353 Hemiplegia and hemiparesis following cerebral infarction affecting right non-dominant side: Secondary | ICD-10-CM

## 2016-04-27 DIAGNOSIS — R278 Other lack of coordination: Secondary | ICD-10-CM

## 2016-04-27 DIAGNOSIS — R471 Dysarthria and anarthria: Secondary | ICD-10-CM

## 2016-04-27 DIAGNOSIS — M6281 Muscle weakness (generalized): Secondary | ICD-10-CM

## 2016-04-27 NOTE — Therapy (Signed)
[facility] [address] [place], [place], [address] Phone: [phone]   Fax:  [phone]  Physical Therapy Treatment  Patient Details  Name: Sean Wilkerson MRN: [id number] Date of Birth: Jul 26, 1946 Referring Provider: Dr. Antony Contras  Encounter Date: 04/27/2016      PT End of Session - 04/27/16 1642    Visit Number 15   Number of Visits 17   Date for PT Re-Evaluation 05/04/16   Authorization Type [employer] Medicare   Authorization Time Period 03-05-16 - 05-04-16   PT Start Time 1449   PT Stop Time 1529   PT Time Calculation (min) 40 min   Equipment Utilized During Treatment Gait belt   Activity Tolerance Patient tolerated treatment well   Behavior During Therapy [facility] for tasks assessed/performed      Past Medical History  Diagnosis Date  . Hypertension   . HTN (hypertension) 10/08/2015  . HLD (hyperlipidemia) 10/08/2015  . Stroke [facility])     Past Surgical History  Procedure Laterality Date  . No past surgeries      There were no vitals filed for this visit.      Subjective Assessment - 04/27/16 1454    Subjective Pt denied falls since last visit. Pt reported his energy has been up and down, he believes this could be due to his preacher friend passing away.   Pertinent History L CVA 10-05-15 - hospitalized until Jan - D/C'd home with [place], [facility] finished up on 02-25-16   Patient Stated Goals "I want to walk without anything"   Currently in Pain? No/denies                         [facility] Adult PT Treatment/Exercise - 04/27/16 1455    Ambulation/Gait   Ambulation/Gait Yes   Ambulation/Gait Assistance 4: Min assist;3: Mod assist;4: Min guard   Ambulation/Gait Assistance Details Pt amb. with RW and R h/o with cues to improve R hip extension, upright posture, stride length (and L step length), and R hip protraction. Pt also amb. backwards in // bars to improve R hip  extension. Pt required frequent seated rest breaks 2/2 fatigue.    Ambulation Distance (Feet) 38 Feet  87' and 4x7' backwards/forwards   Assistive device Rolling walker;Parallel bars   Gait Pattern Decreased hip/knee flexion - right;Decreased stance time - right;Decreased weight shift to right;Decreased step length - left;Trunk rotated posteriorly on right;Step-through pattern   Ambulation Surface Level;Indoor                PT Education - 04/27/16 1641    Education provided Yes   Education Details PT educated pt on goal assessment next visit, which will determine POC.   Person(s) Educated Patient   Methods Explanation   Comprehension Verbalized understanding          PT Short Term Goals - 04/19/16 1218    PT SHORT TERM GOAL #1   Title Pt will perform basic transfers with S only - wheelchair to/from mat.  (04-05-16)   Baseline Min A with RW, AFO, and HO   Status Partially Met  progressing   PT SHORT TERM GOAL #2   Title Stand for at least 3" with intermittent UE support at counter without KAFO on RLE with SBA for incr. independence with ADL's.  (04-05-16)   Baseline 04/13/16: pt able to stand for >/= 3 minutes with intermittent support on walker with  min guard assist to supervision   Status Achieved   PT SHORT TERM GOAL #3   Title Amb. 150' with platform RW with CGA on flat, even surface.  (04-05-16)   Baseline 04/13/16: 52 with RW with right hand orthosis and AFO (bluerocker) with min guard assist.   Status Partially Met  progressing   PT SHORT TERM GOAL #4   Title Perform bed mobility including sit to/from supine with S.  (04-05-16)   Baseline 7/3: (S) for sit > supine; min A for supine > sit   Status Partially Met   PT SHORT TERM GOAL #5   Title Independent in HEP for RLE strengthening.  (04-05-16)   Baseline reports compliance with bed level exercises 04/15/16   Time 4   Period Weeks   Status Achieved           PT Long Term Goals - 03/06/16 1917    PT LONG TERM  GOAL #1   Title Modified independent with basic transfers.  (05-04-16)   Time 8   Period Weeks   Status New   PT LONG TERM GOAL #2   Title Modified independent with bed mobility.  (05-04-16)   Time 8   Period Weeks   Status New   PT LONG TERM GOAL #3   Title Amb. 23' with hemiwalker with S for incr. household amb.  (05-04-16)   Time 8   Period Weeks   Status New   PT LONG TERM GOAL #4   Title Amb. 72' with hemiwalker with CGA for incr. community accessibility.  (05-04-16)   Time 8   Period Weeks   Status New   PT LONG TERM GOAL #5   Title Negotiate steps (4) with min assist with use of hand rail .  (05-04-16)   Time 8   Period Weeks   Status New   Additional Long Term Goals   Additional Long Term Goals Yes   PT LONG TERM GOAL #6   Title Perform Berg balance test when appropriate and establish goal as needed.  (05-04-16)   Time 8   Period Weeks   Status New               Plan - 04/27/16 1642    Clinical Impression Statement Pt required incr. rest breaks during amb. 2/2 fatigue. However, pt demonstrated progress, as he required less assist during sit<>stand txfs prior to gait training (min guard to S). Pt continues to experience decr. R hip ext. during gait and would continue to benefit from skilled PT to improve safety during functional mobility.    Rehab Potential Good   PT Frequency 2x / week   PT Duration 8 weeks   PT Treatment/Interventions ADLs/Self Care Home Management;DME Instruction;Balance training;Therapeutic exercise;Therapeutic activities;Functional mobility training;Stair training;Gait training;Neuromuscular re-education;Patient/family education;Orthotic Fit/Training;Passive range of motion   PT Next Visit Plan Assess LTGs and renew if appropriate.   PT Home Exercise Plan RLE strengthening   Consulted and Agree with Plan of Care Patient      Patient will benefit from skilled therapeutic intervention in order to improve the following deficits and  impairments:  Abnormal gait, Decreased activity tolerance, Decreased balance, Decreased coordination, Decreased endurance, Decreased mobility, Decreased range of motion, Decreased strength, Impaired tone, Impaired UE functional use, Obesity  Visit Diagnosis: Other lack of coordination  Hemiplegia and hemiparesis following cerebral infarction affecting right non-dominant side [facility])     Problem List Patient Active Problem List   Diagnosis Date Noted  .  Slow transit constipation   . Neuropathic pain   . Stroke, acute, thrombotic ([place]) 10/08/2015  . Hemiparesis, aphasia, and dysphagia as late effect of cerebrovascular accident (CVA) ([place]) 10/08/2015  . Essential hypertension 10/08/2015  . HLD (hyperlipidemia) 10/08/2015  . Hemiplegia and hemiparesis following cerebral infarction affecting right dominant side ([place])   . Right hemiplegia ([place]) 10/06/2015  . Stroke (cerebrum) ([place]) 10/05/2015  . CVA (cerebral infarction) 10/05/2015    Race Latour L 04/27/2016, 4:44 PM  [facility] [address] [place], [place], [address] Phone: [phone]   Fax:  [phone]  Name: Calieb Lichtman MRN: [id number] Date of Birth: 21-Jul-1946    Geoffry Paradise, PT,DPT 04/27/2016 4:44 PM Phone: [phone] Fax: [phone]

## 2016-04-27 NOTE — Therapy (Signed)
[facility] [address] [place], [place], [address] Phone: [phone]   Fax:  [phone]  Occupational Therapy Treatment  Patient Details  Name: Sean Wilkerson MRN: [id number] Date of Birth: December 08, 1945 Referring Provider: Dr. Pearlean Brownie  Encounter Date: 04/27/2016      OT End of Session - 04/27/16 2345    Visit Number 14   Number of Visits 17   Date for OT Re-Evaluation 05/02/16   Authorization Type UMR MCR   Authorization - Visit Number 14   Authorization - Number of Visits 20   OT Start Time 1404   OT Stop Time 1446   OT Time Calculation (min) 42 min   Activity Tolerance Patient tolerated treatment well   Behavior During Therapy [facility] for tasks assessed/performed      Past Medical History  Diagnosis Date  . Hypertension   . HTN (hypertension) 10/08/2015  . HLD (hyperlipidemia) 10/08/2015  . Stroke [facility])     Past Surgical History  Procedure Laterality Date  . No past surgeries      Filed Vitals:   04/27/16 1436  BP: 131/72  Pulse: 64        Subjective Assessment - 04/27/16 2337    Subjective  Patient indicates that he feels he is making good progress at home, now walking to bathroom with wife.  Indicates race fits well on right hand.    Currently in Pain? No/denies   Pain Score 0-No pain                      OT Treatments/Exercises (OP) - 04/27/16 0001    Neurological Re-education Exercises   Other Exercises 1 Neuromuscular reeducation to address dynamic standing balance in coordination with right arm movement.   Patient needing slow deliberate cues to stand on rgiht leg in order to activate right trunk and arm.  Patient unable to sustain controlled weight shift to right side, compensating with hip flexion, or trunk rotation,.  Patient inevitably reports pain at waist, requiring him to sit - assume this is accesory motion in trunk.  Patient in standing assisted to modified  plantigrade position to accept weight through  extended right arm.  Patient with increased weight bearing with weight shift to right reported wrist pain at [facility] joint.  Attempts to reposition were initially successful, but pain persisted.  Performed traction and manula realignment to reduce wrist pain. THis was helpful during this session.     Other Exercises 2 In standing wrapped right hand onto extended PVC pipe for forced use of right shoulder and elbow.  Patient with very strong, reaction with increased tension toward adduction, internl rotation, and relative shoulder flexion.  With increased support to standing weight shift, patient able to activate shoulder flex with elbow extension in modified closed chain.                  OT Education - 04/27/16 2345    Education provided Yes   Education Details Importance of stand balance for arm function   Person(s) Educated Patient   Methods Explanation   Comprehension Verbalized understanding;Need further instruction          OT Short Term Goals - 04/13/16 1429    OT SHORT TERM GOAL #1   Title I with inital HEP.- check 04/03/16   Status Achieved   OT SHORT TERM GOAL #2   Title Pt will demonstrate understanding of RUE positioning to minimize pain and risk for  injury including splint wear PRN.   Status Achieved   OT SHORT TERM GOAL #3   Title Pt will consistently donn shirt with min A.   Status Achieved   OT SHORT TERM GOAL #4   Title Pt will perform toilet transfers with min A.   Status Achieved   OT SHORT TERM GOAL #5   Title Pt will donn pants with min A using AE PRN.   Status Achieved   OT SHORT TERM GOAL #6   Title Pt will  perform shower transfer and bathe with min A.   Status On-going   OT SHORT TERM GOAL #7   Status Achieved           OT Long Term Goals - 03/09/16 1724    OT LONG TERM GOAL #1   Title Pt will donn shirt with set up only.- check 05/01/16   Time 8   Period Weeks   Status New   OT LONG TERM GOAL #2    Title Pt will donn pants with minguard for balance.   Time 8   Period Weeks   Status New   OT LONG TERM GOAL #3   Title Pt will donn socks and shoes with min A.   Time 8   Period Weeks   Status New   OT LONG TERM GOAL #4   Title Pt will use RUE as a stabilizer for ADLS at least 25 % of the time with pain less than or equal to 3/10.   Time 8   Period Weeks   Status New   OT LONG TERM GOAL #5   Title Pt will demonstrate P/ROM shoulder flexion to 100* with pain no greater than 3/10.   Time 8   Period Weeks   Status New   OT LONG TERM GOAL #6   Title Pt will demonstrate ability to stand at countertop and retrieve an item from midlevel shelf with LUE, with supervision and no LOB.   Time 8   Period Weeks   Status New               Plan - 04/27/16 2346    Clinical Impression Statement Patient is showing decreased dependence with ADL, and functional mobility   Rehab Potential Good   Clinical Impairments Affecting Rehab Potential Potential for depression- will monitor and offer support, resources as warranted   OT Frequency 2x / week   OT Duration 8 weeks   OT Treatment/Interventions Self-care/ADL training;Therapeutic exercise;Patient/family education;Balance training;Splinting;Neuromuscular education;Ultrasound;Energy conservation;Therapeutic exercises;Therapeutic activities;DME and/or AE instruction;Parrafin;Cryotherapy;Electrical Stimulation;Fluidtherapy;Cognitive remediation/compensation;Visual/perceptual remediation/compensation;Passive range of motion;Contrast Bath;Moist Heat   Plan bathroom transfers, NMR RUE   Consulted and Agree with Plan of Care Patient      Patient will benefit from skilled therapeutic intervention in order to improve the following deficits and impairments:     Visit Diagnosis: Other lack of coordination  Hemiplegia and hemiparesis following cerebral infarction affecting right non-dominant side (HCC)  Muscle weakness  (generalized)  Unsteadiness on feet    Problem List Patient Active Problem List   Diagnosis Date Noted  . Slow transit constipation   . Neuropathic pain   . Stroke, acute, thrombotic ([facility]) 10/08/2015  . 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 Essential hypertension 10/08/2015  . HLD (hyperlipidemia) 10/08/2015  . Hemiplegia and hemiparesis following cerebral infarction affecting right dominant side (HCC)   . Right hemiplegia (H[facility]) 10/06/2015  . Stroke (cerebrum) ([facility]) 10/05/2015  . CVA (  cerebral infarction) 10/05/2015    Collier SalinaGellert, Rether Rison M 04/27/2016, 11:48 PM  [facility] [address] [place], [place], [address] Phone: [phone]   Fax:  [phone]  Name: Sean Wilkerson MRN: [id number] Date of Birth: [birth year]

## 2016-04-27 NOTE — Therapy (Signed)
[facility] [address] [place], [place], [address] Phone: [phone]   Fax:  [phone]  Speech Language Pathology Treatment  Patient Details  Name: Sean Wilkerson MRN: [id number] Date of Birth: [birth year] Referring Provider: Antony Contras  Encounter Date: 04/27/2016      End of Session - 04/27/16 1402    Visit Number 10   Number of Visits 17   Date for SLP Re-Evaluation 05/07/16   SLP Start Time 80   SLP Stop Time  1402   SLP Time Calculation (min) 43 min   Activity Tolerance Patient tolerated treatment well      Past Medical History  Diagnosis Date  . Hypertension   . HTN (hypertension) 10/08/2015  . HLD (hyperlipidemia) 10/08/2015  . Stroke [facility])     Past Surgical History  Procedure Laterality Date  . No past surgeries      There were no vitals filed for this visit.      Subjective Assessment - 04/27/16 1322    Subjective Mod hoarseness today.   Currently in Pain? No/denies               ADULT SLP TREATMENT - 04/27/16 1341    General Information   Behavior/Cognition Alert;Cooperative;Pleasant mood   Treatment Provided   Treatment provided Cognitive-Linquistic   Cognitive-Linquistic Treatment   Treatment focused on Dysarthria   Skilled Treatment SLP facilitated pt's improved speech intelligibility by practicing overarticulation and louder speech volume. In reading tasks of 10-12 words pt maintained intelligibility at 85-90%. 75% success in louder overarticulated speech with spontaneous 1-2 sentence responses, and intelligibility was 90-95%.    Assessment / Recommendations / Plan   Plan Continue with current plan of care   Progression Toward Goals   Progression toward goals Progressing toward goals            SLP Short Term Goals - 04/22/16 1506    SLP SHORT TERM GOAL #1   Title pt will demo 23/25 reps of EMST with proper procedure over 4 sessions   Status Not Met   SLP SHORT TERM GOAL #2   Title pt will demo oral-motor HEP with rare min A    Status Partially Met   SLP SHORT TERM GOAL #3   Title pt will demo 70dB average volume in 5 minutes simple conversation over three sessions   Status Not Met          SLP Long Term Goals - 04/27/16 1407    SLP LONG TERM GOAL #1   Title pt will demo 22/25 reps of EMST with appropriate procedure over 4 sessions   Baseline one session 04-22-16   Time 2   Period Weeks   Status Revised   SLP LONG TERM GOAL #2   Title pt will demo oral motor HEP with rare min A over three sessions   Time 2   Period Weeks   Status Revised   SLP LONG TERM GOAL #3   Title pt will demo reduced rate of speech in 5 minutes simple conversation adequate for 95% intelligibility over 3 sessions   Time 2   Period Weeks   Status Revised          Plan - 04/27/16 1402    Clinical Impression Statement 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 Pt continues to require min A for carryover of slow rate - continue skilled ST to maximize intellgibility and carryover of compensations for dysarthria.   Speech Therapy  Frequency 2x / week   Duration --  3 weeks   Treatment/Interventions Oral motor exercises;Compensatory strategies;SLP instruction and feedback;Patient/family education;Functional tasks;Cueing hierarchy   Potential to Achieve Goals Good   Potential Considerations Severity of impairments      Patient will benefit from skilled therapeutic intervention in order to improve the following deficits and impairments:   Dysarthria and anarthria      G-Codes - 05/06/16 1409    Functional Assessment Tool Used noms - 5 (40% impaired)   Functional Limitations Motor speech   Motor Speech Current Status [phone]) At least 40 percent but less than 60 percent impaired, limited or restricted   Motor Speech Goal Status (Y3016) At least 20 percent but less than 40 percent impaired, limited or restricted     Speech  Therapy Progress Note  Dates of Reporting Period: 03-05-16 to present  Objective Reports of Subjective Statement: pt has had 10 visits focusing on louder and more intelligible speech.   Objective Measurements: Pt's speech is more intelligible now than it was prior to therapy.  Goal Update: See above  Plan: Pt will cont to be seen for skilled ST to improve his intelligibilty  Reason Skilled Services are Required: Intelligible speech has not carried over into conversational speech with any consistency.    Problem List Patient Active Problem List   Diagnosis Date Noted  . Slow transit constipation   . Neuropathic pain   . Stroke, acute, thrombotic ([place]) 10/08/2015  . Hemiparesis, aphasia, and dysphagia as late effect of cerebrovascular accident (CVA) ([place]) 10/08/2015  . Essential hypertension 10/08/2015  . HLD (hyperlipidemia) 10/08/2015  . Hemiplegia and hemiparesis following cerebral infarction affecting right dominant side ([place])   . Right hemiplegia ([place]) 10/06/2015  . Stroke (cerebrum) ([place]) 10/05/2015  . CVA (cerebral infarction) 10/05/2015    [facility], [place]  May 06, 2016, 2:10 PM  [facility] [address] [place], [place], [address] Phone: [phone]   Fax:  [phone]   Name: Sean Wilkerson MRN: [id number] Date of Birth: 1945-12-16

## 2016-04-29 ENCOUNTER — Ambulatory Visit: Payer: Medicare Other | Admitting: Rehabilitation

## 2016-04-29 ENCOUNTER — Encounter: Payer: Self-pay | Admitting: Occupational Therapy

## 2016-04-29 ENCOUNTER — Encounter: Payer: Self-pay | Admitting: Rehabilitation

## 2016-04-29 ENCOUNTER — Ambulatory Visit: Payer: Medicare Other | Admitting: Occupational Therapy

## 2016-04-29 ENCOUNTER — Ambulatory Visit: Payer: Medicare Other

## 2016-04-29 DIAGNOSIS — M6281 Muscle weakness (generalized): Secondary | ICD-10-CM

## 2016-04-29 DIAGNOSIS — R471 Dysarthria and anarthria: Secondary | ICD-10-CM

## 2016-04-29 DIAGNOSIS — I69353 Hemiplegia and hemiparesis following cerebral infarction affecting right non-dominant side: Secondary | ICD-10-CM | POA: Diagnosis not present

## 2016-04-29 DIAGNOSIS — R2681 Unsteadiness on feet: Secondary | ICD-10-CM

## 2016-04-29 DIAGNOSIS — R278 Other lack of coordination: Secondary | ICD-10-CM

## 2016-04-29 DIAGNOSIS — R2689 Other abnormalities of gait and mobility: Secondary | ICD-10-CM

## 2016-04-29 NOTE — Therapy (Signed)
[facility] [address] [place], [place], [address] Phone: [phone]   Fax:  [phone]  Physical Therapy Treatment  Patient Details  Name: Sean Wilkerson MRN: [id number] Date of Birth: 1946/07/07 Referring Provider: Dr. Antony Contras  Encounter Date: 04/29/2016      PT End of Session - 04/29/16 1317    Visit Number 16   Number of Visits 33  +16 additional visits   Date for PT Re-Evaluation 06/28/16   Authorization Type [employer] Medicare   Authorization Time Period 04/29/16-06/28/16   PT Start Time 1315   PT Stop Time 1402   PT Time Calculation (min) 47 min   Equipment Utilized During Treatment Gait belt   Activity Tolerance Patient tolerated treatment well   Behavior During Therapy [facility] for tasks assessed/performed      Past Medical History  Diagnosis Date  . Hypertension   . HTN (hypertension) 10/08/2015  . HLD (hyperlipidemia) 10/08/2015  . Stroke [facility])     Past Surgical History  Procedure Laterality Date  . No past surgeries      There were no vitals filed for this visit.      Subjective Assessment - 04/29/16 1313    Subjective "I'm going to see my daughter this weekend, so I'm hoping I can do some walking."    Patient is accompained by: Family member   Pertinent History L CVA 10-05-15 - hospitalized until Jan - D/C'd home with [place], [place] finished up on 02-25-16   Patient Stated Goals "I want to walk without anything"   Currently in Pain? No/denies             NMR:  Worked on short distance gait (61') with use of quad tip cane in order to minimize amount of UE support to allow better use of RLE, trunk control and improved midline orientation.  Utilized [employer], engineering for improved visual feedback on posture and alignment.  Pt able to ambulate at min/mod A level with continued verbal and facilitation cues for upright trunk, improved R hip protraction, increased step length B,  esp on LLE and taking slower step on the L to maximize time spent on RLE.  Pt did very well with this and feel he is making good progress.    Gait:  Spent good amount of time addressing negotiation of curb step (single step) without rail and 4 steps with single rail to work towards LTG and also as pt states he is going to daughter's home this weekend and will have to perform stairs.  Pt unsure of how many stairs daughter has, but feels that it is only one.  Practiced curb step (and ramp) x 2 reps during session at min/mod A level with step by step cues for sequencing.  Then performed 4 steps sideways with R rail only to better simulate entry into community.  Pt able to perform at min A level with min A when descending to ensure proper placement of RLE.    TA:  Addressed bed mobility during session as this is STG and LTG that he has not yet met.  Pt very close to meeting at S level, however requires A for RLE to elevate it into bed from sitting position.  Educated pt on how to use LLE to self assist into SL position.  Cues for rolling into SL from supine then into sitting position without use of rail needed.  Pt voicing that he is still in  hospital bed at home and would like to transition to his personal bed.  His bed is elevated therefore educated on getting small step stool to get into bed and that we would continue to practice this in therapy.  Pt verbalized understanding.                      PT Education - 04/29/16 1317    Education provided Yes   Education Details education on how to perform stairs at daughters and how wife should assist (wife not present to educate)   Person(s) Educated Patient   Methods Explanation;Demonstration;Tactile cues;Verbal cues   Comprehension Verbalized understanding;Returned demonstration;Verbal cues required;Tactile cues required;Need further instruction          PT Short Term Goals - 04/29/16 1851    PT SHORT TERM GOAL #1   Title Pt will perform  basic transfers with S only - wheelchair to/from mat.  (modified [place] Date: 05/27/16)   Baseline Min A with RW, AFO, and HO   Status On-going  progressing   PT SHORT TERM GOAL #2   Title Stand for at least 3" with intermittent UE support at counter without KAFO on RLE with SBA for incr. independence with ADL's.  (04-05-16)   Baseline 04/13/16: pt able to stand for >/= 3 minutes with intermittent support on walker with min guard assist to supervision   Status Achieved   PT SHORT TERM GOAL #3   Title Amb. 150' with LRAD with CGA on flat, even surface.  (Modified [place] Date: 05/27/16)   Baseline Up to 52' with RW with right hand orthosis and AFO with min guard assist.   Status On-going  progressing   PT SHORT TERM GOAL #4   Title Perform bed mobility including sit to/from supine with S.  (Modified [place] Date: 05/27/16)   Baseline 04/29/16 min A for sit>supine   Status On-going   PT SHORT TERM GOAL #5   Title Independent in HEP for RLE strengthening.  (04-05-16)   Baseline reports compliance with bed level exercises 04/15/16   Time 4   Period Weeks   Status Achieved           PT Long Term Goals - 04/29/16 1848    PT LONG TERM GOAL #1   Title Modified independent with basic transfers.  Modified [place] Date: 06/24/16)   Baseline Min/guard for stand pivot transfers with RW and HO 04/29/16   Time 8   Period Weeks   Status On-going   PT LONG TERM GOAL #2   Title Modified independent with bed mobility.  (Modified [place] Date: 06/24/16)   Baseline Requires min A for safety of RLE 04/29/16   Time 8   Period Weeks   Status On-going   PT LONG TERM GOAL #3   Title Amb. 100' with LRAD with S for incr. household amb.  (Modified [place] Date: 06/24/16)   Baseline Amb up to 81' w/ RW and R hand orthosis at min A level   Time 8   Period Weeks   Status On-going  and revised distance   PT LONG TERM GOAL #4   Title Amb. 87' with hemiwalker with CGA for incr. community accessibility.  (Modified Target  Date: 06/24/16)   Time 8   Period Weeks   Status On-going   PT LONG TERM GOAL #5   Title Negotiate steps (4) with S with use of hand rail .  (Modified [place] Date: 06/24/16)   Time 8  Period Weeks   Status New   PT LONG TERM GOAL #6   Title Perform Berg balance test when appropriate and establish goal as needed.  (05-04-16)   Baseline Will likely not be appropriate due to fall risk without device.    Time 8   Period Weeks   Status Deferred               Plan - 04/29/16 1317    Clinical Impression Statement Skilled session focused on curb step and stair negotiation as pt reports going to daughter's home this weekend and was unsure of how many stairs she has but believes that it is only one step.  Addressed bed mobility to assess LTG.  Also conitnue to work on [employer] with gait with quad tip cane in order to address midline orientation and increased forward lateral weight shift onto RLE during R stance phase of gait.  Due to pt making progress towards STG and LTGs, feel that it is appropriate to extend POC for another 8 weeks.  Pt in agreement.     Rehab Potential Good   PT Frequency 2x / week   PT Duration 8 weeks   PT Treatment/Interventions ADLs/Self Care Home Management;DME Instruction;Balance training;Therapeutic exercise;Therapeutic activities;Functional mobility training;Stair training;Gait training;Neuromuscular re-education;Patient/family education;Orthotic Fit/Training;Passive range of motion   PT Next Visit Plan Emmerson Shuffield did re-cert (I did 8 weeks)-continue to work on stairs Melene Muller will need to come in for training-ask how stairs went at daughter's house), bed mobility using small step in order for pt to return to his bed, gait training w/ LRAD to work on NMR/trunk control, midline orientation.    PT Home Exercise Plan RLE strengthening   Consulted and Agree with Plan of Care Patient      Patient will benefit from skilled therapeutic intervention in order to improve the following  deficits and impairments:  Abnormal gait, Decreased activity tolerance, Decreased balance, Decreased coordination, Decreased endurance, Decreased mobility, Decreased range of motion, Decreased strength, Impaired tone, Impaired UE functional use, Obesity  Visit Diagnosis: Hemiplegia and hemiparesis following cerebral infarction affecting right non-dominant side (HCC) - Plan: PT plan of care cert/re-cert  Muscle weakness (generalized) - Plan: PT plan of care cert/re-cert  Unsteadiness on feet - Plan: PT plan of care cert/re-cert  Other abnormalities of gait and mobility - Plan: PT plan of care cert/re-cert     Problem List Patient Active Problem List   Diagnosis Date Noted  . Slow transit constipation   . Neuropathic pain   . Stroke, acute, thrombotic ([place]) 10/08/2015  . Hemiparesis, aphasia, and dysphagia as late effect of cerebrovascular accident (CVA) ([place]) 10/08/2015  . Essential hypertension 10/08/2015  . HLD (hyperlipidemia) 10/08/2015  . Hemiplegia and hemiparesis following cerebral infarction affecting right dominant side ([place])   . Right hemiplegia ([place]) 10/06/2015  . Stroke (cerebrum) ([place]) 10/05/2015  . CVA (cerebral infarction) 10/05/2015    Cameron Sprang, PT, MPT [facility] [address] [place], [place], [address] Phone: [phone]   Fax:  [phone] 04/29/2016, 7:03 PM  Name: Sean Wilkerson MRN: [id number] Date of Birth: 27-Nov-1945

## 2016-04-29 NOTE — Therapy (Signed)
[facility] [address] [place], [place], [address] Phone: [phone]   Fax:  [phone]  Occupational Therapy Treatment  Patient Details  Name: Sean Wilkerson MRN: [id number] Date of Birth: 1946/09/13 Referring Provider: Dr. Pearlean BrownieSethi  Encounter Date: 04/29/2016      OT End of Session - 04/29/16 1606    Visit Number 15   Number of Visits 17   Date for OT Re-Evaluation 05/02/16   Authorization Type UMR MCR   Authorization - Visit Number 15   Authorization - Number of Visits 20   OT Start Time 1448   OT Stop Time 1530   OT Time Calculation (min) 42 min   Activity Tolerance Patient tolerated treatment well      Past Medical History  Diagnosis Date  . Hypertension   . HTN (hypertension) 10/08/2015  . HLD (hyperlipidemia) 10/08/2015  . Stroke [facility](HCC)     Past Surgical History  Procedure Laterality Date  . No past surgeries      There were no vitals filed for this visit.      Subjective Assessment - 04/29/16 1455    Subjective  I am tired from PT today   Pertinent History see Epic   Patient Stated Goals to be more indpendent   Currently in Pain? No/denies                      OT Treatments/Exercises (OP) - 04/29/16 0001    Neurological Re-education Exercises   Other Exercises 1 Neuro re ed first in sitting/side leaning on R elbow to increase weight bearing and facilitate co contraction with body moving on arm. Initially pt needed mod facilitation however with repetition and demand pt able to maintain shoulder stability with very close supervision and no hands on.  Progressed to sitting with demand in shoulder flexion of 30* - pt could not initiate active shoulder flexion however could activitate against resistance at  elbow in this position. Transitioned to moveable surface for RUE (stool) with pt in high sitting EOM and pt then able to intiate shoulder flexion to approximately 35* as well  as initiate shoulder extension. Pt benefits from repetition and visual attattention to R hand (needs mod cues for attentnion).  Progressed to standing with RUE in weight bearing with mod facilitation to weight shift on to RLE with aligned trunk - pt improved with weight shiftting however strong flexor synergey in RUE with activity, especially in hand.  Pt unable to step with LUE out of weight bearing but could tap toes on left foot.  Also addressed active transfers on to mat and into wheelchair using walker and mod cues to activate and use RLE during transfers.                   OT Short Term Goals - 04/29/16 1604    OT SHORT TERM GOAL #1   Title I with inital HEP.-    Status Achieved   OT SHORT TERM GOAL #2   Title Pt will demonstrate understanding of RUE positioning to minimize pain and risk for injury including splint wear PRN.   Status Achieved   OT SHORT TERM GOAL #3   Title Pt will consistently donn shirt with min A.   Status Achieved   OT SHORT TERM GOAL #4   Title Pt will perform toilet transfers with min A.   Status Achieved   OT SHORT TERM GOAL #5   Title Pt will  donn pants with min A using AE PRN.   Status Achieved   OT SHORT TERM GOAL #6   Title Pt will  perform shower transfer and bathe with min A -05/27/16.   Status On-going   OT SHORT TERM GOAL #7   Status Achieved           OT Long Term Goals - 04/29/16 1604    OT LONG TERM GOAL #1   Title Pt will donn shirt with set up only.- check 06/24/2016   Time 8   Period Weeks   Status Ongoing   OT LONG TERM GOAL #2   Title Pt will donn pants with minguard for balance.   Time 8   Period Weeks   Status Ongoing   OT LONG TERM GOAL #3   Title Pt will donn socks and shoes with min A.   Time 8   Period Weeks   Status Ongoing   OT LONG TERM GOAL #4   Title Pt will use RUE as a stabilizer for ADLS at least 25 % of the time with pain less than or equal to 3/10.   Time 8   Period Weeks   Status Ongoing   OT  LONG TERM GOAL #5   Title Pt will demonstrate P/ROM shoulder flexion to 100* with pain no greater than 3/10.   Time 8   Period Weeks   Status Ongoing   OT LONG TERM GOAL #6   Title Pt will demonstrate ability to stand at countertop and retrieve an item from midlevel shelf with LUE, with supervision and no LOB.   Time 8   Period Weeks   Status Ongoing               Plan - 04/29/16 1605    Clinical Impression Statement Pt with slow progression toward goals. Pt benefits from structured repettion for motor learning   Clinical Impairments Affecting Rehab Potential Potential for depression- will monitor and offer support, resources as warranted   OT Frequency 2x / week   OT Duration 8 weeks   OT Treatment/Interventions Self-care/ADL training;Therapeutic exercise;Patient/family education;Balance training;Splinting;Neuromuscular education;Ultrasound;Energy conservation;Therapeutic exercises;Therapeutic activities;DME and/or AE instruction;Parrafin;Cryotherapy;Electrical Stimulation;Fluidtherapy;Cognitive remediation/compensation;Visual/perceptual remediation/compensation;Passive range of motion;Contrast Bath;Moist Heat   Plan bathroom transfers, NMR for RUE   Consulted and Agree with Plan of Care Patient      Patient will benefit from skilled therapeutic intervention in order to improve the following deficits and impairments:  Decreased coordination, Decreased range of motion, Increased edema, Decreased safety awareness, Decreased endurance, Decreased activity tolerance, Decreased knowledge of precautions, Impaired tone, Obesity, Pain, Impaired UE functional use, Decreased knowledge of use of DME, Decreased balance, Decreased cognition, Decreased mobility, Decreased strength, Impaired perceived functional ability, Impaired vision/preception  Visit Diagnosis: Other lack of coordination  Hemiplegia and hemiparesis following cerebral infarction affecting right non-dominant side  (HCC)  Muscle weakness (generalized)  Unsteadiness on feet  Other abnormalities of gait and mobility    Problem List Patient Active Problem List   Diagnosis Date Noted  . Slow transit constipation   . Neuropathic pain   . Stroke, acute, thrombotic (HCC) 10/08/2015  . 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 Essential hypertension 10/08/2015  . HLD (hyperlipidemia) 10/08/2015  . Hemiplegia and hemiparesis following cerebral infarction affecting right dominant side (HCC)   . Right hemiplegia (HCC) 10/06/2015  . Stroke (cerebrum) (HCC) 10/05/2015  . CVA (cerebral infarction) 10/05/2015   Pt with slower progression than anticipated  toward goals therefore will extend plan of care. Pt in agreement.    Norton Pastel, OTR/L 04/29/2016, 4:07 PM  [facility] [address] [place], [place], [address] Phone: [phone]   Fax:  [phone]  Name: Sean Wilkerson MRN: [id number] Date of Birth: Aug 31, 1946

## 2016-04-29 NOTE — Therapy (Signed)
[facility] [address] [place], [place], [address] Phone: [phone]   Fax:  [phone]  Speech Language Pathology Treatment  Patient Details  Name: Leaman Abe MRN: [id number] Date of Birth: 11/25/1945 Referring Provider: Antony Contras  Encounter Date: 04/29/2016      End of Session - 04/29/16 1453    Visit Number 11   Number of Visits 17   Date for SLP Re-Evaluation 05/07/16   SLP Start Time 1404   SLP Stop Time  1445   SLP Time Calculation (min) 41 min   Activity Tolerance Patient tolerated treatment well      Past Medical History  Diagnosis Date  . Hypertension   . HTN (hypertension) 10/08/2015  . HLD (hyperlipidemia) 10/08/2015  . Stroke [facility])     Past Surgical History  Procedure Laterality Date  . No past surgeries      There were no vitals filed for this visit.      Subjective Assessment - 04/29/16 1441    Subjective "Still having trouble with my slowing down"   Currently in Pain? No/denies               ADULT SLP TREATMENT - 04/29/16 1442    General Information   Behavior/Cognition Alert;Cooperative;Pleasant mood   Treatment Provided   Treatment provided Cognitive-Linquistic   Cognitive-Linquistic Treatment   Treatment focused on Dysarthria   Skilled Treatment Facilitated pt's speech intelligibility by description task (sentence responses). Pt's performance was slower and more intelligible 70% of the time, and he req'd min-mod cues usually. Reduced breath support hindered overall success and pt req'd usual min-mod A for full breath prior to speaking as well. SLP told pt to cont with expiratory muscle training.    Assessment / Recommendations / Plan   Plan Continue with current plan of care   Progression Toward Goals   Progression toward goals Progressing toward goals            SLP Short Term Goals - 04/22/16 1506    SLP SHORT TERM GOAL #1   Title pt will demo  23/25 reps of EMST with proper procedure over 4 sessions   Status Not Met   SLP SHORT TERM GOAL #2   Title pt will demo oral-motor HEP with rare min A    Status Partially Met   SLP SHORT TERM GOAL #3   Title pt will demo 70dB average volume in 5 minutes simple conversation over three sessions   Status Not Met          SLP Long Term Goals - 04/29/16 1458    SLP LONG TERM GOAL #1   Title pt will demo 22/25 reps of EMST with appropriate procedure over 4 sessions   Baseline one session 04-22-16   Time 2   Period Weeks   Status Revised   SLP LONG TERM GOAL #2   Title pt will demo oral motor HEP with rare min A over three sessions   Time 2   Period Weeks   Status Revised   SLP LONG TERM GOAL #3   Title pt will demo reduced rate of speech in 5 minutes simple conversation adequate for 95% intelligibility over 3 sessions   Time 2   Period Weeks   Status Revised          Plan - 04/29/16 1456    Clinical Impression Statement 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 Pt required min-mod A for carryover of  slow rate in structurd tasks - continue skilled ST to maximize intellgibility and carryover of compensations for dysarthria.   Speech Therapy Frequency 2x / week   Treatment/Interventions Oral motor exercises;Compensatory strategies;SLP instruction and feedback;Patient/family education;Functional tasks;Cueing hierarchy   Potential to Achieve Goals Good   Potential Considerations Severity of impairments   Consulted and Agree with Plan of Care Patient      Patient will benefit from skilled therapeutic intervention in order to improve the following deficits and impairments:   Dysarthria and anarthria    Problem List Patient Active Problem List   Diagnosis Date Noted  . Slow transit constipation   . Neuropathic pain   . Stroke, acute, thrombotic ([place]) 10/08/2015  . Hemiparesis, aphasia, and dysphagia as late effect of cerebrovascular accident (CVA)  ([place]) 10/08/2015  . Essential hypertension 10/08/2015  . HLD (hyperlipidemia) 10/08/2015  . Hemiplegia and hemiparesis following cerebral infarction affecting right dominant side ([place])   . Right hemiplegia ([place]) 10/06/2015  . Stroke (cerebrum) ([place]) 10/05/2015  . CVA (cerebral infarction) 10/05/2015    [facility] ,MS, [place]  04/29/2016, 2:59 PM  [facility] [address] [place], [place], [address] Phone: [phone]   Fax:  [phone]   Name: Treyvon Blahut MRN: [id number] Date of Birth: 1945-11-22

## 2016-05-04 ENCOUNTER — Ambulatory Visit: Payer: Medicare Other | Admitting: Physical Therapy

## 2016-05-04 ENCOUNTER — Ambulatory Visit: Payer: Medicare Other | Admitting: Occupational Therapy

## 2016-05-04 ENCOUNTER — Encounter: Payer: Self-pay | Admitting: Occupational Therapy

## 2016-05-04 VITALS — BP 152/82

## 2016-05-04 DIAGNOSIS — R2681 Unsteadiness on feet: Secondary | ICD-10-CM

## 2016-05-04 DIAGNOSIS — I69353 Hemiplegia and hemiparesis following cerebral infarction affecting right non-dominant side: Secondary | ICD-10-CM

## 2016-05-04 DIAGNOSIS — M6281 Muscle weakness (generalized): Secondary | ICD-10-CM

## 2016-05-04 DIAGNOSIS — R278 Other lack of coordination: Secondary | ICD-10-CM

## 2016-05-04 DIAGNOSIS — R2689 Other abnormalities of gait and mobility: Secondary | ICD-10-CM

## 2016-05-04 NOTE — Therapy (Signed)
[facility] [address] [place], [place], [address] Phone: [phone]   Fax:  [phone]  Occupational Therapy Treatment  Patient Details  Name: Sean Wilkerson MRN: [id number] Date of Birth: September 25, 1946 Referring Provider: Dr. Pearlean Brownie  Encounter Date: 05/04/2016      OT End of Session - 05/04/16 1658    Visit Number 16   Number of Visits 32   Date for OT Re-Evaluation 06/24/16   Authorization Type UMR MCR   Authorization - Visit Number 16   Authorization - Number of Visits 32   OT Start Time 1315   OT Stop Time 1400   OT Time Calculation (min) 45 min   Activity Tolerance Patient tolerated treatment well   Behavior During Therapy [employer] for tasks assessed/performed      Past Medical History  Diagnosis Date  . Hypertension   . HTN (hypertension) 10/08/2015  . HLD (hyperlipidemia) 10/08/2015  . Stroke [facility])     Past Surgical History  Procedure Laterality Date  . No past surgeries      There were no vitals filed for this visit.      Subjective Assessment - 05/04/16 1402    Subjective  I feel like she is helping me less regarding dressing   Patient Stated Goals to be more indpendent   Currently in Pain? No/denies   Pain Score 0-No pain                      OT Treatments/Exercises (OP) - 05/04/16 0001    ADLs   Toileting Patient very eager to discontinue use of lift for bathroom.  Patient wants to walk with wife to bathroom at home.  Today, patient able to walk to clinic bathroom using rolling walker and walker splint and minimla facilitation to limit trunk rotation and increase stance time on right leg.  Patient very pleased that he was able to compklete this task with very little physical assistance.  Door management was difficult, although clinic doors are much heavier than most interior doors in a private residence.  Patient needs additional practive for walking with direction changes;  turning, backing up, etc.  Patient becomes very nervous when he tries a new task, and becomes short of breath.  He can very easily relax with calm approach and encouragement, but is eager to sit down once he feels fatigue in right leg.     Neurological Re-education Exercises   Other Exercises 1 Neuromuscular reeducation to address right arm as support in sitting position.  Patient with limited wrist extension in long arm weight bearing.  With traction and carpal alignment, patient able to accept weight onto openhand over rounded surface.                  OT Education - 05/04/16 1658    Education provided Yes   Education Details Encouraged further independence with lower body dressing   Person(s) Educated Patient   Methods Explanation   Comprehension Verbalized understanding;Need further instruction          OT Short Term Goals - 04/29/16 1604    OT SHORT TERM GOAL #1   Title I with inital HEP.- check 04/03/16   Status Achieved   OT SHORT TERM GOAL #2   Title Pt will demonstrate understanding of RUE positioning to minimize pain and risk for injury including splint wear PRN.   Status Achieved   OT SHORT TERM GOAL #3   Title Pt  will consistently donn shirt with min A.   Status Achieved   OT SHORT TERM GOAL #4   Title Pt will perform toilet transfers with min A.   Status Achieved   OT SHORT TERM GOAL #5   Title Pt will donn pants with min A using AE PRN.   Status Achieved   OT SHORT TERM GOAL #6   Title Pt will  perform shower transfer and bathe with min A.- 05/27/2016   Status On-going   OT SHORT TERM GOAL #7   Status Achieved           OT Long Term Goals - 04/29/16 1604    OT LONG TERM GOAL #1   Title Pt will donn shirt with set up only.- check 06/24/2016   Time 8   Period Weeks   Status On-going   OT LONG TERM GOAL #2   Title Pt will donn pants with minguard for balance.   Time 8   Period Weeks   Status On-going   OT LONG TERM GOAL #3   Title Pt will donn  socks and shoes with min A.   Time 8   Period Weeks   Status On-going   OT LONG TERM GOAL #4   Title Pt will use RUE as a stabilizer for ADLS at least 25 % of the time with pain less than or equal to 3/10.   Time 8   Period Weeks   Status On-going   OT LONG TERM GOAL #5   Title Pt will demonstrate P/ROM shoulder flexion to 100* with pain no greater than 3/10.   Time 8   Period Weeks   Status On-going   OT LONG TERM GOAL #6   Title Pt will demonstrate ability to stand at countertop and retrieve an item from midlevel shelf with LUE, with supervision and no LOB.   Time 8   Period Weeks   Status On-going               Plan - 05/04/16 1704    Clinical Impression Statement Patient showing steady progress toward goals of increased independence with ADL due to improved functional mobility, activity tolerance, and strength in RLE.     Rehab Potential Good   Clinical Impairments Affecting Rehab Potential Potential for depression- will monitor and offer support, resources as warranted   OT Frequency 2x / week   OT Duration 8 weeks   OT Treatment/Interventions Self-care/ADL training;Therapeutic exercise;Patient/family education;Balance training;Splinting;Neuromuscular education;Ultrasound;Energy conservation;Therapeutic exercises;Therapeutic activities;DME and/or AE instruction;Parrafin;Cryotherapy;Electrical Stimulation;Fluidtherapy;Cognitive remediation/compensation;Visual/perceptual remediation/compensation;Passive range of motion;Contrast Bath;Moist Heat   Plan NMR RUE   Consulted and Agree with Plan of Care Patient      Patient will benefit from skilled therapeutic intervention in order to improve the following deficits and impairments:  Decreased coordination, Decreased range of motion, Increased edema, Decreased safety awareness, Decreased endurance, Decreased activity tolerance, Decreased knowledge of precautions, Impaired tone, Obesity, Pain, Impaired UE functional use,  Decreased knowledge of use of DME, Decreased balance, Decreased cognition, Decreased mobility, Decreased strength, Impaired perceived functional ability, Impaired vision/preception  Visit Diagnosis: Other lack of coordination  Hemiplegia and hemiparesis following cerebral infarction affecting right non-dominant side (HCC)  Muscle weakness (generalized)  Unsteadiness on feet    Problem List Patient Active Problem List   Diagnosis Date Noted  . Slow transit constipation   . Neuropathic pain   . Stroke, acute, thrombotic (HCC) 10/08/2015  . 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  Essential hypertension 10/08/2015  . HLD (hyperlipidemia) 10/08/2015  . Hemiplegia and hemiparesis following cerebral infarction affecting right dominant side (HCC)   . Right hemiplegia (HCC) 10/06/2015  . Stroke (cerebrum) (HCC) 10/05/2015  . CVA (cerebral infarction) 10/05/2015    Collier Salina, OTR/L 05/04/2016, 5:06 PM  [facility] [address] [place], [place], [address] Phone: [phone]   Fax:  [phone]  Name: Zenith Lamphier MRN: [id number] Date of Birth: Mar 09, 1946

## 2016-05-04 NOTE — Patient Instructions (Addendum)
Walking Program:  Begin walking for 5 minutes, 1-2 times/day, 5 days/week.   Progress your walking program by adding 1 minute to your routine each week, as tolerated. Be sure to wear your brace, walk inside your home with [name] standing next to you, and only progress to your tolerance.       Hip flexor stretch - RIGHT      Lie on your back with your hospital bed in a flat position. RIGHT leg straight. Bring left knee to chest. You should feel a gentle stretch in the front of your RIGHT hip.  Hold for 45 seconds. Repeat 2-3 times per day.

## 2016-05-04 NOTE — Therapy (Signed)
[facility] [address] [place], [place], [address] Phone: [phone]   Fax:  [phone]  Physical Therapy Treatment  Patient Details  Name: Sean Wilkerson MRN: [id number] Date of Birth: 1946/07/07 Referring Provider: Dr. Delia Heady  Encounter Date: 05/04/2016      PT End of Session - 05/04/16 1827    Visit Number 17   Number of Visits 33   Date for PT Re-Evaluation 06/28/16   Authorization Type UHC Medicare   Authorization Time Period 04/29/16-06/28/16   PT Start Time 1401   PT Stop Time 1445   PT Time Calculation (min) 44 min   Equipment Utilized During Treatment Gait belt   Activity Tolerance Patient tolerated treatment well;Patient limited by fatigue  Seated rest breaks required.   Behavior During Therapy [facility] for tasks assessed/performed      Past Medical History  Diagnosis Date  . Hypertension   . HTN (hypertension) 10/08/2015  . HLD (hyperlipidemia) 10/08/2015  . Stroke [facility])     Past Surgical History  Procedure Laterality Date  . No past surgeries      Filed Vitals:   05/04/16 1421  BP: 152/82        Subjective Assessment - 05/04/16 1405    Subjective "I saw my grand daughter last weekend for her [age] birthday - it was excellent." Pt was able to to negotiate step to enter grand daughter's home using rolling walker, and also ambulated into daughter's home using walker. Wife reports pt is now able to ambulate into bathroom and sit on toilet by himself; however, pt unable to get off of toilet independently due to low height.    Patient is accompained by: Family member  wife, [name]   Pertinent History L CVA 10-05-15 - hospitalized until Jan - D/C'd home with Home Health PT, OT ST - Home health finished up on 02-25-16   Patient Stated Goals "I want to walk without anything"   Currently in Pain? No/denies                         [facility] Adult PT Treatment/Exercise - 05/04/16  0001    Bed Mobility   Bed Mobility Supine to Sit;Sit to Supine   Supine to Sit 4: Min assist   Supine to Sit Details (indicate cue type and reason) x1 to R side with min A for RLE advancement toward EOM, manual stabilization of R shoulder for joint protection. To L side x1 with cueing to manually assist RUE across midline, assist to advance RLE off EOM.    Sit to Supine 3: Mod assist;5: Supervision   Sit to Supine - Details (indicate cue type and reason) to R side with mod A for BLE management (due to weight of R AFO); to L side with (S).   Ambulation/Gait   Ambulation/Gait Yes   Ambulation/Gait Assistance 4: Min guard   Ambulation/Gait Assistance Details Gait x75' (x5.5 consecutive minutes, to pt fatigue) with min guard, cueing to address R trunk rotation (improved as compared with previous sessions), R stance stability with emphasis R pelvic protraction, anterior weight shift during LLE advancement.  Verbal reminders for patient to breathe during gait.   Ambulation Distance (Feet) 75 Feet  x2   Assistive device Rolling walker;Other (Comment)  with R h/o;  R AFO   Gait Pattern Decreased hip/knee flexion - right;Decreased weight shift to right;Trunk rotated posteriorly on right;Step-through pattern;Decreased step length - right  R pelvic  retraction during R stance   Ambulation Surface Level;Indoor   Exercises   Exercises Other Exercises   Other Exercises  Pt reporting "pulling" in anterior aspect of R hip when PT cued pt to fully extend R hip during gait training. Therefore, attempted to stretch R hip flexor via pt in supine with RLE extended off EOM x2 minutes total; however, pt unable to perform at home due to hospital bed height. Therefore, recommended pt bring LUE to chest while RLE resting (knee straight) on flat hospital bed to stretch R iliopsoas 2 x45-sec holds.                PT Education - 05/04/16 1812    Education provided Yes   Education Details Initiated walking  program for increased endurance. added hip flexor stretch to HEP.    Person(s) Educated Patient;Spouse   Methods Explanation;Demonstration;Verbal cues;Handout   Comprehension Verbalized understanding;Returned demonstration          PT Short Term Goals - 04/29/16 1851    PT SHORT TERM GOAL #1   Title Pt will perform basic transfers with S only - wheelchair to/from mat.  (modified Target Date: 05/27/16)   Baseline Min A with RW, AFO, and HO   Status On-going  progressing   PT SHORT TERM GOAL #2   Title Stand for at least 3" with intermittent UE support at counter without KAFO on RLE with SBA for incr. independence with ADL's.  (04-05-16)   Baseline 04/13/16: pt able to stand for >/= 3 minutes with intermittent support on walker with min guard assist to supervision   Status Achieved   PT SHORT TERM GOAL #3   Title Amb. 150' with LRAD with CGA on flat, even surface.  (Modified Target Date: 05/27/16)   Baseline Up to 8250' with RW with right hand orthosis and AFO with min guard assist.   Status On-going  progressing   PT SHORT TERM GOAL #4   Title Perform bed mobility including sit to/from supine with S.  (Modified Target Date: 05/27/16)   Baseline 04/29/16 min A for sit>supine   Status On-going   PT SHORT TERM GOAL #5   Title Independent in HEP for RLE strengthening.  (04-05-16)   Baseline reports compliance with bed level exercises 04/15/16   Time 4   Period Weeks   Status Achieved           PT Long Term Goals - 04/29/16 1848    PT LONG TERM GOAL #1   Title Modified independent with basic transfers.  Modified Target Date: 06/24/16)   Baseline Min/guard for stand pivot transfers with RW and HO 04/29/16   Time 8   Period Weeks   Status On-going   PT LONG TERM GOAL #2   Title Modified independent with bed mobility.  (Modified Target Date: 06/24/16)   Baseline Requires min A for safety of RLE 04/29/16   Time 8   Period Weeks   Status On-going   PT LONG TERM GOAL #3   Title Amb. 100'  with LRAD with S for incr. household amb.  (Modified Target Date: 06/24/16)   Baseline Amb up to 380' w/ RW and R hand orthosis at min A level   Time 8   Period Weeks   Status On-going  and revised distance   PT LONG TERM GOAL #4   Title Amb. 21240' with hemiwalker with CGA for incr. community accessibility.  (Modified Target Date: 06/24/16)   Time 8   Period  Weeks   Status On-going   PT LONG TERM GOAL #5   Title Negotiate steps (4) with S with use of hand rail .  (Modified Target Date: 06/24/16)   Time 8   Period Weeks   Status New   PT LONG TERM GOAL #6   Title Perform Berg balance test when appropriate and establish goal as needed.  (05-04-16)   Baseline Will likely not be appropriate due to fall risk without device.    Time 8   Period Weeks   Status Deferred               Plan - 05/04/16 1408    Clinical Impression Statement Session focused on increasing pt independence with bed mobility and initiating walking program to increase pt endurance. Pt able to ambulate for 5.5 consecutive minutes prior to requesting seated rest break; vital signs WNL. Also added modified R hip flexor stretch to HEP.   Rehab Potential Good   PT Frequency 2x / week   PT Duration 8 weeks   PT Treatment/Interventions ADLs/Self Care Home Management;DME Instruction;Balance training;Therapeutic exercise;Therapeutic activities;Functional mobility training;Stair training;Gait training;Neuromuscular re-education;Patient/family education;Orthotic Fit/Training;Passive range of motion   PT Next Visit Plan If wife brought measurements of home bed, practice bed mobility at simulated bed height. Sit > stand from lower height (to simulate home toilet). Continue R NMR, gait training, and bed mobility (without R AFO).    PT Home Exercise Plan RLE strengthening   Consulted and Agree with Plan of Care Patient;Family member/caregiver   Family Member Consulted spouse [name]      Patient will benefit from skilled therapeutic  intervention in order to improve the following deficits and impairments:  Abnormal gait, Decreased activity tolerance, Decreased balance, Decreased coordination, Decreased endurance, Decreased mobility, Decreased range of motion, Decreased strength, Impaired tone, Impaired UE functional use, Obesity  Visit Diagnosis: Hemiplegia and hemiparesis following cerebral infarction affecting right non-dominant side (HCC)  Other abnormalities of gait and mobility     Problem List Patient Active Problem List   Diagnosis Date Noted  . Slow transit constipation   . Neuropathic pain   . Stroke, acute, thrombotic (HCC) 10/08/2015  . 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 Essential hypertension 10/08/2015  . HLD (hyperlipidemia) 10/08/2015  . Hemiplegia and hemiparesis following cerebral infarction affecting right dominant side (HCC)   . Right hemiplegia (HCC) 10/06/2015  . Stroke (cerebrum) (HCC) 10/05/2015  . CVA (cerebral infarction) 10/05/2015   Jorje Guild, PT, DPT [facility] [address] [place], [place], [address] Phone: [phone]   Fax:  [phone] 05/04/2016, 6:36 PM  Name: Ranier Coach MRN: [id number] Date of Birth: Dec 15, 1945

## 2016-05-07 ENCOUNTER — Encounter: Payer: Self-pay | Admitting: Rehabilitation

## 2016-05-07 ENCOUNTER — Ambulatory Visit: Payer: Medicare Other | Admitting: Rehabilitation

## 2016-05-07 ENCOUNTER — Ambulatory Visit: Payer: Medicare Other | Admitting: Occupational Therapy

## 2016-05-07 ENCOUNTER — Ambulatory Visit: Payer: Medicare Other

## 2016-05-07 ENCOUNTER — Encounter: Payer: Self-pay | Admitting: Occupational Therapy

## 2016-05-07 DIAGNOSIS — I69353 Hemiplegia and hemiparesis following cerebral infarction affecting right non-dominant side: Secondary | ICD-10-CM

## 2016-05-07 DIAGNOSIS — R471 Dysarthria and anarthria: Secondary | ICD-10-CM

## 2016-05-07 DIAGNOSIS — R2681 Unsteadiness on feet: Secondary | ICD-10-CM

## 2016-05-07 DIAGNOSIS — R278 Other lack of coordination: Secondary | ICD-10-CM

## 2016-05-07 DIAGNOSIS — R2689 Other abnormalities of gait and mobility: Secondary | ICD-10-CM

## 2016-05-07 DIAGNOSIS — M6281 Muscle weakness (generalized): Secondary | ICD-10-CM

## 2016-05-07 NOTE — Therapy (Signed)
[facility] [address] [place], [place], [address] Phone: [phone]   Fax:  [phone]  Occupational Therapy Treatment  Patient Details  Name: Sean Wilkerson MRN: [id number] Date of Birth: 1946-08-06 Referring Provider: Dr. Pearlean Brownie  Encounter Date: 05/07/2016      OT End of Session - 05/07/16 1501    Visit Number 17   Number of Visits 32   Date for OT Re-Evaluation 06/24/16   Authorization Type UMR MCR   Authorization - Visit Number 17   Authorization - Number of Visits 32   OT Start Time 1403   OT Stop Time 1445   OT Time Calculation (min) 42 min   Activity Tolerance Patient tolerated treatment well   Behavior During Therapy [facility] for tasks assessed/performed      Past Medical History  Diagnosis Date  . Hypertension   . HTN (hypertension) 10/08/2015  . HLD (hyperlipidemia) 10/08/2015  . Stroke [facility])     Past Surgical History  Procedure Laterality Date  . No past surgeries      There were no vitals filed for this visit.      Subjective Assessment - 05/07/16 1455    Subjective  I bought a book I hope will help - Stroke of [employer]ight   Pertinent History see Epic   Patient Stated Goals to be more indpendent   Currently in Pain? No/denies   Pain Score 0-No pain                      OT Treatments/Exercises (OP) - 05/07/16 0001    Neurological Re-education Exercises   Other Exercises 1 Neuromuscular reeducation to facvilitate active movement in right upper extremity.  Sat patient on phsioball to activate increased trunk activation, as preparation to activate right arm.  Patient able to activate shoulder adduction, flexion and extension while seated on ball.  Patient able to generate increased muscle action when postural control really challenged.  Best use of right arm when aligned and active throughout Right side.  In sitting, weight bearing through extended right arm - without report of  wrist pain.  Patient with shoulder motion, and beginning volitional forearm pronation.  Immediately following weight bearing, patient able to demonstrate slight finger flexion (2-5).  Patient able to pinch and release 1 inch block with mod cueing and min facilitation.                  OT Education - 05/07/16 1500    Education provided Yes   Education Details importance of right side activation for right arm movement   Person(s) Educated Patient   Methods Explanation   Comprehension Verbalized understanding          OT Short Term Goals - 04/29/16 1604    OT SHORT TERM GOAL #1   Title I with inital HEP.- check 04/03/16   Status Achieved   OT SHORT TERM GOAL #2   Title Pt will demonstrate understanding of RUE positioning to minimize pain and risk for injury including splint wear PRN.   Status Achieved   OT SHORT TERM GOAL #3   Title Pt will consistently donn shirt with min A.   Status Achieved   OT SHORT TERM GOAL #4   Title Pt will perform toilet transfers with min A.   Status Achieved   OT SHORT TERM GOAL #5   Title Pt will donn pants with min A using AE PRN.   Status Achieved  OT SHORT TERM GOAL #6   Title Pt will  perform shower transfer and bathe with min A.- 05/27/2016   Status On-going   OT SHORT TERM GOAL #7   Status Achieved           OT Long Term Goals - 04/29/16 1604    OT LONG TERM GOAL #1   Title Pt will donn shirt with set up only.- check 06/24/2016   Time 8   Period Weeks   Status On-going   OT LONG TERM GOAL #2   Title Pt will donn pants with minguard for balance.   Time 8   Period Weeks   Status On-going   OT LONG TERM GOAL #3   Title Pt will donn socks and shoes with min A.   Time 8   Period Weeks   Status On-going   OT LONG TERM GOAL #4   Title Pt will use RUE as a stabilizer for ADLS at least 25 % of the time with pain less than or equal to 3/10.   Time 8   Period Weeks   Status On-going   OT LONG TERM GOAL #5   Title Pt will  demonstrate P/ROM shoulder flexion to 100* with pain no greater than 3/10.   Time 8   Period Weeks   Status On-going   OT LONG TERM GOAL #6   Title Pt will demonstrate ability to stand at countertop and retrieve an item from midlevel shelf with LUE, with supervision and no LOB.   Time 8   Period Weeks   Status On-going               Plan - 05/07/16 1501    Clinical Impression Statement Patient with dense hemiplegia in right arm, and with apraxia, sensory impairments, and cognitive deficits - yet is showing beginning active motion in right arm.  Patient progressing with basic ADL skills   Rehab Potential Good   Clinical Impairments Affecting Rehab Potential Potential for depression- will monitor and offer support, resources as warranted   OT Frequency 2x / week   OT Duration 8 weeks   OT Treatment/Interventions Self-care/ADL training;Therapeutic exercise;Patient/family education;Balance training;Splinting;Neuromuscular education;Ultrasound;Energy conservation;Therapeutic exercises;Therapeutic activities;DME and/or AE instruction;Parrafin;Cryotherapy;Electrical Stimulation;Fluidtherapy;Cognitive remediation/compensation;Visual/perceptual remediation/compensation;Passive range of motion;Contrast Bath;Moist Heat   Plan NMR RUE   Consulted and Agree with Plan of Care Patient      Patient will benefit from skilled therapeutic intervention in order to improve the following deficits and impairments:  Decreased coordination, Decreased range of motion, Increased edema, Decreased safety awareness, Decreased endurance, Decreased activity tolerance, Decreased knowledge of precautions, Impaired tone, Obesity, Pain, Impaired UE functional use, Decreased knowledge of use of DME, Decreased balance, Decreased cognition, Decreased mobility, Decreased strength, Impaired perceived functional ability, Impaired vision/preception  Visit Diagnosis: Other lack of coordination  Hemiplegia and hemiparesis  following cerebral infarction affecting right non-dominant side (HCC)  Muscle weakness (generalized)  Unsteadiness on feet    Problem List Patient Active Problem List   Diagnosis Date Noted  . Slow transit constipation   . Neuropathic pain   . Stroke, acute, thrombotic (HCC) 10/08/2015  . 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 Essential hypertension 10/08/2015  . HLD (hyperlipidemia) 10/08/2015  . Hemiplegia and hemiparesis following cerebral infarction affecting right dominant side (HCC)   . Right hemiplegia (HCC) 10/06/2015  . Stroke (cerebrum) (HCC) 10/05/2015  . CVA (cerebral infarction) 10/05/2015    Collier SalinaGellert, Tyteanna Ost M , OTR/L  05/07/2016, 3:03 PM  [facility] [address] [place], [place], [address] Phone: [phone]   Fax:  [phone]  Name: Sean Wilkerson MRN: [id number] Date of Birth: 08-02-1946

## 2016-05-07 NOTE — Therapy (Signed)
[facility] [address] [place], [place], [address] Phone: [phone]   Fax:  [phone]  Speech Language Pathology Treatment  Patient Details  Name: Sean Wilkerson MRN: [id number] Date of Birth: 1946-02-05 Referring Provider: Antony Contras  Encounter Date: 05/07/2016      End of Session - 05/07/16 1424    Visit Number 12   Number of Visits 17   Date for SLP Re-Evaluation 05/07/16   SLP Start Time 85   SLP Stop Time  1400   SLP Time Calculation (min) 42 min   Activity Tolerance Patient tolerated treatment well      Past Medical History  Diagnosis Date  . Hypertension   . HTN (hypertension) 10/08/2015  . HLD (hyperlipidemia) 10/08/2015  . Stroke [facility])     Past Surgical History  Procedure Laterality Date  . No past surgeries      There were no vitals filed for this visit.      Subjective Assessment - 05/07/16 1323    Subjective "I'm more conscious of it." (rate reduction)               ADULT SLP TREATMENT - 05/07/16 1323    General Information   Behavior/Cognition Alert;Cooperative;Pleasant mood   Treatment Provided   Treatment provided Cognitive-Linquistic   Cognitive-Linquistic Treatment   Treatment focused on Dysarthria   Skilled Treatment Pt reports keeping up with HEP for respiration 5 sets of 5 reps twice a day at 60 cm H2O. Pt blew with adequate response on EMST 150 20/25, with usual reminders for full breath.     Assessment / Recommendations / Plan   Plan Continue with current plan of care          SLP Education - 05/07/16 1423    Education provided Yes   Education Details renew for four more weeks, focusing on slower more intelligible speech   Person(s) Educated Patient   Methods Explanation   Comprehension Verbalized understanding          SLP Short Term Goals - 04/22/16 1506    SLP SHORT TERM GOAL #1   Title pt will demo 23/25 reps of EMST with proper  procedure over 4 sessions   Status Not Met   SLP SHORT TERM GOAL #2   Title pt will demo oral-motor HEP with rare min A    Status Partially Met   SLP SHORT TERM GOAL #3   Title pt will demo 70dB average volume in 5 minutes simple conversation over three sessions   Status Not Met          SLP Long Term Goals - 05/07/16 1429    SLP LONG TERM GOAL #1   Title pt will demo 22/25 reps of EMST with appropriate procedure over 4 sessions   Baseline one session 04-22-16   Time 1  all LTGs renewed beginning week of 05-10-16   Period Weeks   Status Not Met   SLP LONG TERM GOAL #2   Title pt will demo oral motor HEP with rare min A over three sessions   Time 1   Period Weeks   Status Partially Met   SLP LONG TERM GOAL #3   Title pt will demo reduced rate of speech in 5 minutes simple conversation adequate for 95% intelligibility over 3 sessions   Time 1   Period Weeks   Status Not Met          Plan - 05/07/16 1424  Clinical Impression Statement 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 Pt completes HEP for dysarthria with approx 85% success with slow rate. Pt continues to require min-mod A for carryover of slow rate into structured sentence tasks, as well as for full breath prior to respiratory muscle training. Skilled ST recommended to continue for four more weeks to focus on maximizing intellgibility by rate reduction in structured tasks, leading to simple conversation. If pt unable to make measurable progress in 3 additional weeks, he will be d/c'd from [place] and provided more home tasks to encourage incr'd intelligibility. Pt agrees with this plan.   Speech Therapy Frequency 2x / week   Duration 4 weeks (beginning week of 05-10-16)   Treatment/Interventions Oral motor exercises;Compensatory strategies;SLP instruction and feedback;Patient/family education;Functional tasks;Cueing hierarchy   Potential to Achieve Goals Good   Potential Considerations Severity of  impairments   Consulted and Agree with Plan of Care Patient      Patient will benefit from skilled therapeutic intervention in order to improve the following deficits and impairments:   Dysarthria and anarthria    Problem List Patient Active Problem List   Diagnosis Date Noted  . Slow transit constipation   . Neuropathic pain   . Stroke, acute, thrombotic ([place]) 10/08/2015  . Hemiparesis, aphasia, and dysphagia as late effect of cerebrovascular accident (CVA) ([place]) 10/08/2015  . Essential hypertension 10/08/2015  . HLD (hyperlipidemia) 10/08/2015  . Hemiplegia and hemiparesis following cerebral infarction affecting right dominant side ([place])   . Right hemiplegia ([place]) 10/06/2015  . Stroke (cerebrum) ([place]) 10/05/2015  . CVA (cerebral infarction) 10/05/2015    [facility] ,MS, [place]  05/07/2016, 2:32 PM  [facility] [address] [place], [place], [address] Phone: [phone]   Fax:  [phone]   Name: Sean Wilkerson MRN: [id number] Date of Birth: [birth year]

## 2016-05-07 NOTE — Patient Instructions (Signed)
  Please complete the assigned speech therapy homework for 15 minutes, twice a day.

## 2016-05-08 ENCOUNTER — Encounter: Payer: Self-pay | Admitting: Rehabilitation

## 2016-05-08 NOTE — Therapy (Signed)
[facility] [address] [place], [place], [address] Phone: [phone]   Fax:  [phone]  Physical Therapy Treatment  Patient Details  Name: Sean Wilkerson MRN: [id number] Date of Birth: Jan 22, 1946 Referring Provider: Dr. Delia Heady  Encounter Date: 05/07/2016      PT End of Session - 05/08/16 0947    Visit Number 18   Number of Visits 33   Date for PT Re-Evaluation 06/28/16   Authorization Type [employer] Medicare   Authorization Time Period 04/29/16-06/28/16   PT Start Time 1447   PT Stop Time 1532   PT Time Calculation (min) 45 min   Equipment Utilized During Treatment Gait belt   Activity Tolerance Patient tolerated treatment well;Patient limited by fatigue  Seated rest breaks required.   Behavior During Therapy [facility] for tasks assessed/performed      Past Medical History  Diagnosis Date  . Hypertension   . HTN (hypertension) 10/08/2015  . HLD (hyperlipidemia) 10/08/2015  . Stroke [facility])     Past Surgical History  Procedure Laterality Date  . No past surgeries      There were no vitals filed for this visit.      Subjective Assessment - 05/08/16 0945    Subjective "I'm still having some trouble with depression, but I got this book that is by someone who had a stroke and is dealing with it, so I'm excited to read that."     Pertinent History L CVA 10-05-15 - hospitalized until Jan - D/C'd home with Home Health PT, OT ST - Home health finished up on 02-25-16   Patient Stated Goals "I want to walk without anything"   Currently in Pain? No/denies           NMR:  Session focused on NMR for RUE/LE via WB, weight shifting and forced use tasks; had pt bear weight through RUE (with therapist assist at axilla, elbow and wrist for safety and continued extension) while PT had pt reach for items first forward and to the R to increase weight shift over RUE/LE while in semi squatted position for increased  activation of RLE progressing to reaching for object placed more in front of pt (clothes pins) for increased forward weight shift and by changing object reaching for allowing pt to spend increased time in WB position.  PT provided facilitation at arm as above but also at RLE to increase lateral weight shift onto RLE during task with cues for improved forward weight shift, upright posture, and controlled descent back to mat.  Pt tolerated well!  Ended session with two bouts of gait x 45' x 1 and 70' x 1 without AD (with AFO) with primary therapist assisting at pelvis to facilitate increased R glute activation and improved R hip protraction during R stance phase of gait while secondary PT provided assist with BUEs on each of her shoulders to encourage upright posture and provide light facilitation for R weight shift.  Cues for "tucking hip" when on RLE with good return demonstration during gait.  As gait continued, PT's provided less facilitation with pt able to maintain balance at approx min A level.  Pt making excellent progress towards gait goals.                        PT Education - 05/08/16 0946    Education provided Yes   Education Details Education on neuropsych services to address depression/coping if needed.  Person(s) Educated Patient   Methods Explanation   Comprehension Verbalized understanding          PT Short Term Goals - 04/29/16 1851    PT SHORT TERM GOAL #1   Title Pt will perform basic transfers with S only - wheelchair to/from mat.  (modified [place] Date: 05/27/16)   Baseline Min A with RW, AFO, and HO   Status On-going  progressing   PT SHORT TERM GOAL #2   Title Stand for at least 3" with intermittent UE support at counter without KAFO on RLE with SBA for incr. independence with ADL's.  (04-05-16)   Baseline 04/13/16: pt able to stand for >/= 3 minutes with intermittent support on walker with min guard assist to supervision   Status Achieved   PT SHORT  TERM GOAL #3   Title Amb. 150' with LRAD with CGA on flat, even surface.  (Modified [place] Date: 05/27/16)   Baseline Up to 56' with RW with right hand orthosis and AFO with min guard assist.   Status On-going  progressing   PT SHORT TERM GOAL #4   Title Perform bed mobility including sit to/from supine with S.  (Modified [place] Date: 05/27/16)   Baseline 04/29/16 min A for sit>supine   Status On-going   PT SHORT TERM GOAL #5   Title Independent in HEP for RLE strengthening.  (04-05-16)   Baseline reports compliance with bed level exercises 04/15/16   Time 4   Period Weeks   Status Achieved           PT Long Term Goals - 04/29/16 1848    PT LONG TERM GOAL #1   Title Modified independent with basic transfers.  Modified [place] Date: 06/24/16)   Baseline Min/guard for stand pivot transfers with RW and HO 04/29/16   Time 8   Period Weeks   Status On-going   PT LONG TERM GOAL #2   Title Modified independent with bed mobility.  (Modified Target Date: 06/24/16)   Baseline Requires min A for safety of RLE 04/29/16   Time 8   Period Weeks   Status On-going   PT LONG TERM GOAL #3   Title Amb. 100' with LRAD with S for incr. household amb.  (Modified Target Date: 06/24/16)   Baseline Amb up to 65' w/ RW and R hand orthosis at min A level   Time 8   Period Weeks   Status On-going  and revised distance   PT LONG TERM GOAL #4   Title Amb. 46' with hemiwalker with CGA for incr. community accessibility.  (Modified Target Date: 06/24/16)   Time 8   Period Weeks   Status On-going   PT LONG TERM GOAL #5   Title Negotiate steps (4) with S with use of hand rail .  (Modified Target Date: 06/24/16)   Time 8   Period Weeks   Status New   PT LONG TERM GOAL #6   Title Perform Berg balance test when appropriate and establish goal as needed.  (05-04-16)   Baseline Will likely not be appropriate due to fall risk without device.    Time 8   Period Weeks   Status Deferred               Plan -  05/08/16 0948    Clinical Impression Statement Skilled session focused on NMR for improved RUE/LE activation via WB, weight shifting, forced use activities as well as gait without device to improve postural control, improve  weight shifting, and forward weight shift onto RLE.  Tolerated all very well with great progress noted with gait during session.     Rehab Potential Good   PT Frequency 2x / week   PT Duration 8 weeks   PT Treatment/Interventions ADLs/Self Care Home Management;DME Instruction;Balance training;Therapeutic exercise;Therapeutic activities;Functional mobility training;Stair training;Gait training;Neuromuscular re-education;Patient/family education;Orthotic Fit/Training;Passive range of motion   PT Next Visit Plan If wife brought measurements of home bed, practice bed mobility at simulated bed height. Sit > stand from lower height (to simulate home toilet). Continue R NMR, gait training, and bed mobility (without R AFO).    PT Home Exercise Plan RLE strengthening   Consulted and Agree with Plan of Care Patient;Family member/caregiver   Family Member Consulted spouse [name]      Patient will benefit from skilled therapeutic intervention in order to improve the following deficits and impairments:  Abnormal gait, Decreased activity tolerance, Decreased balance, Decreased coordination, Decreased endurance, Decreased mobility, Decreased range of motion, Decreased strength, Impaired tone, Impaired UE functional use, Obesity  Visit Diagnosis: Unsteadiness on feet  Hemiplegia and hemiparesis following cerebral infarction affecting right non-dominant side (HCC)  Muscle weakness (generalized)  Other abnormalities of gait and mobility     Problem List Patient Active Problem List   Diagnosis Date Noted  . Slow transit constipation   . Neuropathic pain   . Stroke, acute, thrombotic (HCC) 10/08/2015  . Hemiparesis, aphasia, and dysphagia as late effect of cerebrovascular accident  (CVA) (HCC) 10/08/2015  . Essential hypertension 10/08/2015  . HLD (hyperlipidemia) 10/08/2015  . Hemiplegia and hemiparesis following cerebral infarction affecting right dominant side (HCC)   . Right hemiplegia (HCC) 10/06/2015  . Stroke (cerebrum) (HCC) 10/05/2015  . CVA (cerebral infarction) 10/05/2015    Harriet Butte, PT, MPT [facility] [address] [place], [place], [address] Phone: [phone]   Fax:  [phone] 05/08/2016, 9:51 AM  Name: Helton Bonser MRN: [id number] Date of Birth: 04-Feb-1946

## 2016-05-11 ENCOUNTER — Ambulatory Visit: Payer: Medicare Other | Admitting: Occupational Therapy

## 2016-05-11 ENCOUNTER — Ambulatory Visit: Payer: Medicare Other

## 2016-05-11 ENCOUNTER — Ambulatory Visit: Payer: Medicare Other | Admitting: Physical Therapy

## 2016-05-11 DIAGNOSIS — I69353 Hemiplegia and hemiparesis following cerebral infarction affecting right non-dominant side: Secondary | ICD-10-CM

## 2016-05-11 DIAGNOSIS — R278 Other lack of coordination: Secondary | ICD-10-CM

## 2016-05-11 DIAGNOSIS — R2681 Unsteadiness on feet: Secondary | ICD-10-CM

## 2016-05-11 DIAGNOSIS — M6281 Muscle weakness (generalized): Secondary | ICD-10-CM

## 2016-05-11 DIAGNOSIS — R471 Dysarthria and anarthria: Secondary | ICD-10-CM

## 2016-05-11 DIAGNOSIS — R2689 Other abnormalities of gait and mobility: Secondary | ICD-10-CM

## 2016-05-11 NOTE — Therapy (Signed)
[facility] [address] [place], [place], [address] Phone: [phone]   Fax:  [phone]  Occupational Therapy Treatment  Patient Details  Name: Sean Wilkerson MRN: [id number] Date of Birth: July 06, 1946 Referring Provider: Dr. Leonie Man  Encounter Date: 05/11/2016      OT End of Session - 05/11/16 1306    Visit Number 18   Number of Visits 32   Date for OT Re-Evaluation 06/24/16   Authorization Type UMR MCR   Authorization - Visit Number 18   Authorization - Number of Visits 32   OT Start Time 1017   OT Stop Time 1100   OT Time Calculation (min) 43 min   Activity Tolerance Patient tolerated treatment well      Past Medical History:  Diagnosis Date  . HLD (hyperlipidemia) 10/08/2015  . HTN (hypertension) 10/08/2015  . Hypertension   . Stroke [facility])     Past Surgical History:  Procedure Laterality Date  . NO PAST SURGERIES      There were no vitals filed for this visit.      Subjective Assessment - 05/11/16 1020    Subjective  The pain in my arm is getting better - I don't have any today   Patient is accompained by: Family member  wife   Pertinent History see Epic   Patient Stated Goals to be more indpendent   Currently in Pain? No/denies                      OT Treatments/Exercises (OP) - 05/11/16 0001      ADLs   UB Dressing Practiced upper and  lower body dressing strategies - by end of session pt able to don button down shirt with mod verbal and tactile cues only and assist to button (pt able to unbutton with extra time). Pt also able to don pants over feet and pull up/down pants wtih close supervision and cues to go slowly. Pt and wife to practice at home.      Neurological Re-education Exercises   Other Exercises 1 Neuro re ed to address increasing scapular and proximal stability and activity with side leaning on RUE and reaching with LUE. Transitioned to sitting and pt able  to acheive active shoulder flexion in weight bearing on moveable surface with min facilitation.                   OT Short Term Goals - 05/11/16 1305      OT SHORT TERM GOAL #1   Title I with inital HEP.- check 04/03/16   Status Achieved     OT SHORT TERM GOAL #2   Title Pt will demonstrate understanding of RUE positioning to minimize pain and risk for injury including splint wear PRN.   Status Achieved     OT SHORT TERM GOAL #3   Title Pt will consistently donn shirt with min A.   Status Achieved     OT SHORT TERM GOAL #4   Title Pt will perform toilet transfers with min A.   Status Achieved     OT SHORT TERM GOAL #5   Title Pt will donn pants with min A using AE PRN.   Status Achieved     OT SHORT TERM GOAL #6   Title Pt will  perform shower transfer and bathe with min A.- 05/27/2016   Status Achieved     OT SHORT TERM GOAL #7   Status Achieved  OT Long Term Goals - 05/11/16 1305      OT LONG TERM GOAL #1   Title Pt will donn shirt with set up only.- check 06/24/2016   Time 8   Period Weeks   Status On-going     OT LONG TERM GOAL #2   Title Pt will donn pants with minguard for balance.   Time 8   Period Weeks   Status On-going     OT LONG TERM GOAL #3   Title Pt will donn socks and shoes with min A.   Time 8   Period Weeks   Status On-going     OT LONG TERM GOAL #4   Title Pt will use RUE as a stabilizer for ADLS at least 25 % of the time with pain less than or equal to 3/10.   Time 8   Period Weeks   Status On-going     OT LONG TERM GOAL #5   Title Pt will demonstrate P/ROM shoulder flexion to 100* with pain no greater than 3/10.   Time 8   Period Weeks   Status On-going     OT LONG TERM GOAL #6   Title Pt will demonstrate ability to stand at countertop and retrieve an item from midlevel shelf with LUE, with supervision and no LOB.   Time 8   Period Weeks   Status On-going               Plan - 05/11/16 1305     Clinical Impression Statement Pt has met all STG's and is progressing toward LTG's at this time.    Rehab Potential Good   Clinical Impairments Affecting Rehab Potential Potential for depression- will monitor and offer support, resources as warranted   OT Frequency 2x / week   OT Duration 8 weeks   OT Treatment/Interventions Self-care/ADL training;Therapeutic exercise;Patient/family education;Balance training;Splinting;Neuromuscular education;Ultrasound;Energy conservation;Therapeutic exercises;Therapeutic activities;DME and/or AE instruction;Parrafin;Cryotherapy;Electrical Stimulation;Fluidtherapy;Cognitive remediation/compensation;Visual/perceptual remediation/compensation;Passive range of motion;Contrast Bath;Moist Heat   Plan NMR for RUE, check how dressing is going, functional mobility   Consulted and Agree with Plan of Care Patient   Family Member Consulted wife      Patient will benefit from skilled therapeutic intervention in order to improve the following deficits and impairments:  Decreased coordination, Decreased range of motion, Increased edema, Decreased safety awareness, Decreased endurance, Decreased activity tolerance, Decreased knowledge of precautions, Impaired tone, Obesity, Pain, Impaired UE functional use, Decreased knowledge of use of DME, Decreased balance, Decreased cognition, Decreased mobility, Decreased strength, Impaired perceived functional ability, Impaired vision/preception  Visit Diagnosis: 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 Slow transit constipation   . Neuropathic pain   . Stroke, acute, thrombotic ([place]) 10/08/2015  . Hemiparesis, aphasia, and dysphagia as late effect of cerebrovascular accident (CVA) ([place]) 10/08/2015  . Essential  hypertension 10/08/2015  . HLD (hyperlipidemia) 10/08/2015  . Hemiplegia and hemiparesis following cerebral infarction affecting right dominant side ([place])   . Right hemiplegia ([place]) 10/06/2015  . Stroke (cerebrum) ([place]) 10/05/2015  . CVA (cerebral infarction) 10/05/2015    Quay Burow, OTR/L 05/11/2016, 1:08 PM  [facility] [address] [place], [place], [address] Phone: [phone]   Fax:  [phone]  Name: Sean Wilkerson MRN: [id number] Date of Birth: 07/26/1946

## 2016-05-11 NOTE — Therapy (Signed)
[facility] [address] [place], [place], [address] Phone: [phone]   Fax:  [phone]  Physical Therapy Treatment  Patient Details  Name: Secundino Ellithorpe MRN: [id number] Date of Birth: 20-Feb-1946 Referring Provider: Dr. Delia Heady  Encounter Date: 05/11/2016      PT End of Session - 05/11/16 1608    Visit Number 19   Number of Visits 33   Date for PT Re-Evaluation 06/28/16   Authorization Type [employer] Medicare   Authorization Time Period 04/29/16-06/28/16   PT Start Time 1452   PT Stop Time 1534   PT Time Calculation (min) 42 min   Equipment Utilized During Treatment Gait belt   Activity Tolerance Patient tolerated treatment well   Behavior During Therapy [facility] for tasks assessed/performed      Past Medical History:  Diagnosis Date  . HLD (hyperlipidemia) 10/08/2015  . HTN (hypertension) 10/08/2015  . Hypertension   . Stroke [facility])     Past Surgical History:  Procedure Laterality Date  . NO PAST SURGERIES      There were no vitals filed for this visit.      Subjective Assessment - 05/11/16 1548    Subjective Wife arrived to session with measurements of bed (30" from floor to top of mattress). Wife also expressing interest in pt negotiating stairs to enter/exit home, as pt/wife are paying >$200/month to rent a temporary ramp.   Patient is accompained by: Family member  wife, Dale [place]   Pertinent History L CVA 10-05-15 - hospitalized until Jan - D/C'd home with Home Health PT, OT ST - Home health finished up on 02-25-16   Patient Stated Goals "I want to walk without anything"   Currently in Pain? No/denies                         [facility] Adult PT Treatment/Exercise - 05/11/16 0001      Bed Mobility   Bed Mobility Supine to Sit;Sit to Supine   Supine to Sit 5: Supervision   Supine to Sit Details (indicate cue type and reason) On 30" mat table (to simulate bed at home) with cueing for  technique (emphasis on advancing RLE off EOM) during supine > sit using logroll technique. Once seated EOM, PT provided (S) while pt scooted to EOM to place feet on floor. Wife provided (S) during second trial.    Sit to Supine 5: Supervision   Sit to Supine - Details (indicate cue type and reason) On 30" high mat table (to simulate bed at home). Once seated on EOM, pt scooted posteriorly with PT (then wife, on subsequent trial) manually blocking at B knees to prevent hips slipping anteriorly due to pt inability to touch floor with feet while seated EOB. Pt required no assist, no cueing for technique with sit > supine.     Transfers   Transfers Sit to Stand;Stand to Sit   Sit to Stand 5: Supervision   Sit to Stand Details (indicate cue type and reason) cueing for R foot placement prior to standing; (S) for stability/balance while pt managing RUE in R h/o   Stand to Sit 5: Supervision   Stand to Sit Details (S) for stability/balance while pt managing RUE in R h/o   Stand Pivot Transfers 5: Supervision   Stand Pivot Transfer Details (indicate cue type and reason) using R RW with R h/o     Ambulation/Gait   Ambulation/Gait Yes   Ambulation/Gait Assistance  5: Supervision;4: Min guard   Ambulation/Gait Assistance Details (S) to min guard due to intermittent LOB to L side during turning (with effective self-recovery). Cueing also focused on B hip extension, R pelvic protraction during R stance.    Ambulation Distance (Feet) 50 Feet  x4 trials (seated rest breaks between trials)   Assistive device Rolling walker;Other (Comment)  with R h/o;  R AFO   Gait Pattern Decreased hip/knee flexion - right;Decreased weight shift to right;Trunk rotated posteriorly on right;Step-through pattern;Decreased step length - right  R pelvic retraction during R stance   Ambulation Surface Level;Indoor   Stairs Yes   Stairs Assistance 4: Min assist;4: Min guard   Stairs Assistance Details (indicate cue type and  reason) Ascended laterally with LUE support at R rail (to simulate home entrance); descended forward-facing with LUE at L rail. Initial trial with PT providing min guard, verbal cueing for increased R hip/knee flexion during descend to ensure RLE clearance onto next step. Second trial with wife providing min guard-min A (PT providing SBA).   Stair Management Technique One rail Right;Sideways;Forwards;Step to pattern   Number of Stairs 4  x2   Height of Stairs 6     Self-Care   Self-Care Other Self-Care Comments   Other Self-Care Comments  Problem solving getting into/out of home (6 STE with R rail to ascend, followed by small threshold into doorway). Discussed use of RW to negotiate threshold, placing chair at landing at top of stairs for pt to sit in while wife places RW at bottom of steps. Discussed and practiced technique for getting into/out of high bed. Also reminded wife to don R AFO prior to scooting off edge of high bed to prevent R ankle injury.                PT Education - 05/11/16 1549    Education provided Yes   Education Details Discussion/problem solving for stair negotiation and getting into/out of personal bed at home. See Self Care for details. Recommended additional family member be present the first time pt/wife attempt both.   Person(s) Educated Patient;Spouse   Methods Demonstration;Verbal cues;Tactile cues;Explanation;Handout   Comprehension Returned demonstration;Verbalized understanding          PT Short Term Goals - 04/29/16 1851      PT SHORT TERM GOAL #1   Title Pt will perform basic transfers with S only - wheelchair to/from mat.  (modified [place] Date: 05/27/16)   Baseline Min A with RW, AFO, and HO   Status On-going  progressing     PT SHORT TERM GOAL #2   Title Stand for at least 3" with intermittent UE support at counter without KAFO on RLE with SBA for incr. independence with ADL's.  (04-05-16)   Baseline 04/13/16: pt able to stand for >/= 3  minutes with intermittent support on walker with min guard assist to supervision   Status Achieved     PT SHORT TERM GOAL #3   Title Amb. 150' with LRAD with CGA on flat, even surface.  (Modified Target Date: 05/27/16)   Baseline Up to 49' with RW with right hand orthosis and AFO with min guard assist.   Status On-going  progressing     PT SHORT TERM GOAL #4   Title Perform bed mobility including sit to/from supine with S.  (Modified Target Date: 05/27/16)   Baseline 04/29/16 min A for sit>supine   Status On-going     PT SHORT TERM GOAL #5  Title Independent in HEP for RLE strengthening.  (04-05-16)   Baseline reports compliance with bed level exercises 04/15/16   Time 4   Period Weeks   Status Achieved           PT Long Term Goals - 04/29/16 1848      PT LONG TERM GOAL #1   Title Modified independent with basic transfers.  Modified Target Date: 06/24/16)   Baseline Min/guard for stand pivot transfers with RW and HO 04/29/16   Time 8   Period Weeks   Status On-going     PT LONG TERM GOAL #2   Title Modified independent with bed mobility.  (Modified Target Date: 06/24/16)   Baseline Requires min A for safety of RLE 04/29/16   Time 8   Period Weeks   Status On-going     PT LONG TERM GOAL #3   Title Amb. 100' with LRAD with S for incr. household amb.  (Modified Target Date: 06/24/16)   Baseline Amb up to 18' w/ RW and R hand orthosis at min A level   Time 8   Period Weeks   Status On-going  and revised distance     PT LONG TERM GOAL #4   Title Amb. 67' with hemiwalker with CGA for incr. community accessibility.  (Modified Target Date: 06/24/16)   Time 8   Period Weeks   Status On-going     PT LONG TERM GOAL #5   Title Negotiate steps (4) with S with use of hand rail .  (Modified Target Date: 06/24/16)   Time 8   Period Weeks   Status New     PT LONG TERM GOAL #6   Title Perform Berg balance test when appropriate and establish goal as needed.  (05-04-16)   Baseline Will  likely not be appropriate due to fall risk without device.    Time 8   Period Weeks   Status Deferred               Plan - 05/11/16 1609    Clinical Impression Statement Session focused on practicing bed mobility on 30" height mat table to simulate bed at home, and on stair negotiaiton to enable pt/spouse to discontinue ramp rental. Pt's wife did practice assisting with both aspects of mobility and gave effective return demo.   Rehab Potential Good   PT Frequency 2x / week   PT Duration 8 weeks   PT Treatment/Interventions ADLs/Self Care Home Management;DME Instruction;Balance training;Therapeutic exercise;Therapeutic activities;Functional mobility training;Stair training;Gait training;Neuromuscular re-education;Patient/family education;Orthotic Fit/Training;Passive range of motion   PT Next Visit Plan *GCODES and PN  Sit > stand from lower height (to simulate home toilet). Continue R NMR, gait training, stair negotiation (6 STE home w/ R rail) and bed mobility in 30" height bed (without R AFO).    Consulted and Agree with Plan of Care Patient;Family member/caregiver   Family Member Consulted spouse [name]      Patient will benefit from skilled therapeutic intervention in order to improve the following deficits and impairments:  Abnormal gait, Decreased activity tolerance, Decreased balance, Decreased coordination, Decreased endurance, Decreased mobility, Decreased range of motion, Decreased strength, Impaired tone, Impaired UE functional use, Obesity  Visit Diagnosis: Hemiplegia and hemiparesis following cerebral infarction affecting right non-dominant side (HCC)  Other abnormalities of gait and mobility  Unsteadiness on feet     Problem List Patient Active Problem List   Diagnosis Date Noted  . Slow transit constipation   . Neuropathic pain   .  Stroke, acute, thrombotic (HCC) 10/08/2015  . Hemiparesis, aphasia, and dysphagia as late effect of cerebrovascular accident (CVA)  (HCC) 10/08/2015  . Essential hypertension 10/08/2015  . HLD (hyperlipidemia) 10/08/2015  . Hemiplegia and hemiparesis following cerebral infarction affecting right dominant side (HCC)   . Right hemiplegia (HCC) 10/06/2015  . Stroke (cerebrum) (HCC) 10/05/2015  . CVA (cerebral infarction) 10/05/2015   Jorje Guild, PT, DPT [facility] [address] [place], [place], [address] Phone: [phone]   Fax:  [phone] 05/11/16, 4:14 PM  Name: Indigo Emhoff MRN: [id number] Date of Birth: [birth year]

## 2016-05-11 NOTE — Therapy (Signed)
[facility] [address] [place], [place], [address] Phone: [phone]   Fax:  [phone]  Speech Language Pathology Treatment  Patient Details  Name: Sean Wilkerson MRN: [id number] Date of Birth: 10/04/1946 Referring Provider: Delia Heady  Encounter Date: 05/11/2016      End of Session - 05/11/16 1657    Visit Number 13   Number of Visits 17   Date for SLP Re-Evaluation 06/16/16   SLP Start Time 1318   SLP Stop Time  1400   SLP Time Calculation (min) 42 min   Activity Tolerance Patient tolerated treatment well      Past Medical History:  Diagnosis Date  . HLD (hyperlipidemia) 10/08/2015  . HTN (hypertension) 10/08/2015  . Hypertension   . Stroke [facility])     Past Surgical History:  Procedure Laterality Date  . NO PAST SURGERIES      There were no vitals filed for this visit.      Subjective Assessment - 05/11/16 1409    Subjective "Today we came earlier and had to come back."   Currently in Pain? No/denies               ADULT SLP TREATMENT - 05/11/16 1418      General Information   Behavior/Cognition Alert;Cooperative;Pleasant mood     Treatment Provided   Treatment provided Cognitive-Linquistic     Cognitive-Linquistic Treatment   Treatment focused on Dysarthria   Skilled Treatment SLP facilitated pt's more intelligible speech by cueing him usually during respiratory muscle training for the "bike pump" sound, 18/25 success.  In picture description task, pt;s intelligibility was 100%. In verbal 3-step sequencing task, pt's intelligibility was 85-90%, error awareness was <50%. Because of this, SLP directly asked pt to repeat his sentence and then asked, "Did you notice you sped up your speech? After this, pt notably slowed his speech and the need for SLP cueing decr'd to occasional.  SLP educated pt on basics of CVA - that brain cell death occurred when pt had his CVA - pt stated "My  doctor told me my brain wasn't messed up from the stroke."     Assessment / Recommendations / Plan   Plan Continue with current plan of care     Progression Toward Goals   Progression toward goals Progressing toward goals          SLP Education - 05/11/16 1656    Education provided Yes   Education Details HEP, CVA education   Person(s) Educated Patient   Methods Explanation;Handout   Comprehension Verbalized understanding          SLP Short Term Goals - 04/22/16 1506      SLP SHORT TERM GOAL #1   Title pt will demo 23/25 reps of EMST with proper procedure over 4 sessions   Status Not Met     SLP SHORT TERM GOAL #2   Title pt will demo oral-motor HEP with rare min A    Status Partially Met     SLP SHORT TERM GOAL #3   Title pt will demo 70dB average volume in 5 minutes simple conversation over three sessions   Status Not Met          SLP Long Term Goals - 05/11/16 1700      SLP LONG TERM GOAL #1   Title pt will demo 22/25 reps of EMST with appropriate procedure over 4 sessions   Baseline one session 04-22-16   Time 4  Period Weeks   Status Not Met     SLP LONG TERM GOAL #2   Title pt will demo oral motor HEP with rare min A over three sessions   Time 4   Period Weeks   Status Partially Met     SLP LONG TERM GOAL #3   Title pt will demo reduced rate of speech in 5 minutes simple conversation adequate for 95% intelligibility over 3 sessions   Time 4   Period Weeks   Status Not Met          Plan - 05/11/16 1657    Clinical Impression Statement 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 Pt completed HEP for dysarthria with approx 80% success with slow rate. Pt continues to require usual min-mod A both for carryover of slow rate into structured sentence tasks, and for full breath prior to respiratory muscle training reps. Skilled ST recommended to continue to focus on maximizing intellgibility by rate reduction in structured tasks  leading to simple conversation.   Speech Therapy Frequency 2x / week   Treatment/Interventions Oral motor exercises;Compensatory strategies;SLP instruction and feedback;Patient/family education;Functional tasks;Cueing hierarchy   Potential to Achieve Goals Good   Potential Considerations Severity of impairments      Patient will benefit from skilled therapeutic intervention in order to improve the following deficits and impairments:   Dysarthria and anarthria    Problem List Patient Active Problem List   Diagnosis Date Noted  . Slow transit constipation   . Neuropathic pain   . Stroke, acute, thrombotic ([place]) 10/08/2015  . Hemiparesis, aphasia, and dysphagia as late effect of cerebrovascular accident (CVA) ([place]) 10/08/2015  . Essential hypertension 10/08/2015  . HLD (hyperlipidemia) 10/08/2015  . Hemiplegia and hemiparesis following cerebral infarction affecting right dominant side ([place])   . Right hemiplegia ([place]) 10/06/2015  . Stroke (cerebrum) ([place]) 10/05/2015  . CVA (cerebral infarction) 10/05/2015    [facility] ,MS, [place]  05/11/2016, 5:00 PM  [facility] [address] [place], [place], [address] Phone: [phone]   Fax:  [phone]   Name: Sean Wilkerson MRN: [id number] Date of Birth: 30-Jun-1946

## 2016-05-13 ENCOUNTER — Ambulatory Visit: Payer: Medicare Other | Admitting: Rehabilitation

## 2016-05-13 ENCOUNTER — Ambulatory Visit: Payer: Medicare Other | Admitting: Speech Pathology

## 2016-05-13 DIAGNOSIS — I69353 Hemiplegia and hemiparesis following cerebral infarction affecting right non-dominant side: Secondary | ICD-10-CM

## 2016-05-13 DIAGNOSIS — R471 Dysarthria and anarthria: Secondary | ICD-10-CM

## 2016-05-13 DIAGNOSIS — R2681 Unsteadiness on feet: Secondary | ICD-10-CM

## 2016-05-13 DIAGNOSIS — R2689 Other abnormalities of gait and mobility: Secondary | ICD-10-CM

## 2016-05-13 DIAGNOSIS — M6281 Muscle weakness (generalized): Secondary | ICD-10-CM

## 2016-05-13 NOTE — Therapy (Signed)
[facility] [address] [place], [place], [address] Phone: [phone]   Fax:  [phone]  Speech Language Pathology Treatment  Patient Details  Name: Sean Wilkerson MRN: [id number] Date of Birth: Jul 14, 1946 Referring Provider: Antony Contras  Encounter Date: 05/13/2016      End of Session - 05/13/16 1500    Visit Number 14   Number of Visits 17   Date for SLP Re-Evaluation 06/16/16   SLP Start Time 64   SLP Stop Time  23   SLP Time Calculation (min) 42 min      Past Medical History:  Diagnosis Date  . HLD (hyperlipidemia) 10/08/2015  . HTN (hypertension) 10/08/2015  . Hypertension   . Stroke [facility])     Past Surgical History:  Procedure Laterality Date  . NO PAST SURGERIES      There were no vitals filed for this visit.      Subjective Assessment - 05/13/16 1238    Subjective "My voice has been up and down"               ADULT SLP TREATMENT - 05/13/16 1239      General Information   Behavior/Cognition Alert;Cooperative;Pleasant mood     Treatment Provided   Treatment provided Cognitive-Linquistic     Cognitive-Linquistic Treatment   Treatment focused on Dysarthria   Skilled Treatment Facilitated slow rate with cues for big breath before talking and using slow rate - pt required ongoing verbal cues and modeling. Upon re-education of rato[place]e and instruction, pt perfromed over articulation on structured tasks successfully with min A.     Assessment / Recommendations / Plan   Plan Continue with current plan of care     Progression Toward Goals   Progression toward goals Progressing toward goals            SLP Short Term Goals - 05/13/16 1459      SLP SHORT TERM GOAL #1   Title pt will demo 23/25 reps of EMST with proper procedure over 4 sessions   Status Not Met     SLP SHORT TERM GOAL #2   Title pt will demo oral-motor HEP with rare min A    Status Partially Met      SLP SHORT TERM GOAL #3   Title pt will demo 70dB average volume in 5 minutes simple conversation over three sessions   Status Not Met          SLP Long Term Goals - 05/13/16 1459      SLP LONG TERM GOAL #1   Title pt will demo 22/25 reps of EMST with appropriate procedure over 4 sessions   Baseline one session 04-22-16   Time 4   Period Weeks   Status Not Met     SLP LONG TERM GOAL #2   Title pt will demo oral motor HEP with rare min A over three sessions   Time 4   Period Weeks   Status Partially Met     SLP LONG TERM GOAL #3   Title pt will demo reduced rate of speech in 5 minutes simple conversation adequate for 95% intelligibility over 3 sessions   Time 4   Period Weeks   Status Not Met          Plan - 05/13/16 1458    Clinical Impression Statement 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 Pt completed HEP for dysarthria with approx 80% success with slow rate. Pt  continues to require usual min-mod A both for carryover of slow rate into structured sentence tasks, and for full breath prior to respiratory muscle training reps. Skilled ST recommended to continue to focus on maximizing intellgibility by rate reduction in structured tasks leading to simple conversation.   Speech Therapy Frequency 2x / week   Treatment/Interventions Oral motor exercises;Compensatory strategies;SLP instruction and feedback;Patient/family education;Functional tasks;Cueing hierarchy   Potential Considerations Severity of impairments      Patient will benefit from skilled therapeutic intervention in order to improve the following deficits and impairments:   Dysarthria and anarthria    Problem List Patient Active Problem List   Diagnosis Date Noted  . Slow transit constipation   . Neuropathic pain   . Stroke, acute, thrombotic ([place]) 10/08/2015  . Hemiparesis, aphasia, and dysphagia as late effect of cerebrovascular accident (CVA) ([place]) 10/08/2015  .  Essential hypertension 10/08/2015  . HLD (hyperlipidemia) 10/08/2015  . Hemiplegia and hemiparesis following cerebral infarction affecting right dominant side ([place])   . Right hemiplegia ([place]) 10/06/2015  . Stroke (cerebrum) ([place]) 10/05/2015  . CVA (cerebral infarction) 10/05/2015    Chelsey Kimberley, Annye Rusk MS, CCC-SLP 05/13/2016, 3:00 PM  [facility] [address] [place], [place], [address] Phone: [phone]   Fax:  [phone]   Name: Sean Wilkerson MRN: [id number] Date of Birth: [birth year]

## 2016-05-13 NOTE — Patient Instructions (Signed)
   Practice breathing - inhale for 3-5 seconds in your nose                                Exhale for 5 seconds through your lips (gentle blow)  Slow deep breaths  Take deep breaths frequently when you are talking  A big breath is how we generate volume in our speech - this is important for you to keep tension out of your throat.   Practice reading aloud exaggerating your mouth movements and breathing deep with each sentence.

## 2016-05-13 NOTE — Therapy (Signed)
[facility] [address] [place], [place], [address] Phone: [phone]   Fax:  [phone]  Physical Therapy Treatment  Patient Details  Name: Sean Wilkerson MRN: [id number] Date of Birth: Sep 05, 1946 Referring Provider: Dr. Delia Heady  Encounter Date: 05/13/2016      PT End of Session - 05/13/16 1550    Visit Number 20   Number of Visits 33   Date for PT Re-Evaluation 06/28/16   Authorization Type [employer]   Authorization Time Period 04/29/16-06/28/16   PT Start Time 1316   PT Stop Time 1402   PT Time Calculation (min) 46 min   Equipment Utilized During Treatment Gait belt   Activity Tolerance Patient tolerated treatment well   Behavior During Therapy [facility] for tasks assessed/performed      Past Medical History:  Diagnosis Date  . HLD (hyperlipidemia) 10/08/2015  . HTN (hypertension) 10/08/2015  . Hypertension   . Stroke [facility])     Past Surgical History:  Procedure Laterality Date  . NO PAST SURGERIES      There were no vitals filed for this visit.      Subjective Assessment - 05/13/16 1546    Subjective Pt with no changes, no falls.     Patient is accompained by: Family member   Patient Stated Goals "I want to walk without anything"   Currently in Pain? No/denies          NMR:  Continue to work on [employer] for RUE/LE via forced use, WB, and weight shifting in order to increase activation.  Performed tall kneeling task transitioning from sitting on heels to upright position x 10 reps with facilitation at R glute for increased hip protraction as well as at anterior trunk for upright posture.  Also cues for breathing throughout.  Also while in tall kneeling performing LLE side stepping and back to midline x 10 reps, again with cues for posture and facilitation for R hip protraction.  Transitioned to seated position with R UE placed on round disc (for improved wrist comfort) on tall wooden 14" step  while reaching forward for targets.  Provided facilitation at R wrist, elbow, axilla for improved extension while WB over arm and also assist at R LE for increased weight shift and elevation into squat position.  Performed x 7 reps.  Ended session with gait with quad tip cane x 50' at min/mod A level (due to fatigue) with facilitation for upright posture, increased weight shift over RLE, and increased R hip protraction during stance.  Provided cues for slower L step and larger L step to increase time spent in R stance.    Gait:  Performed stairs x 2 reps during session, ascending and descending sideways to better simulate having R rail only at home.  Performed at min A and feel that wife is able to assist (per pt report).  Feel that they could do this at home, therefore state ok to get rid of ramp.  Pt verbalized understanding.                         PT Education - 05/13/16 1547    Education provided Yes   Education Details Educated on pt and wife doing stairs as pt feels comfortable and performed during session x 2 reps at min A   Person(s) Educated Patient   Methods Explanation   Comprehension Verbalized understanding          PT  Short Term Goals - 04/29/16 1851      PT SHORT TERM GOAL #1   Title Pt will perform basic transfers with S only - wheelchair to/from mat.  (modified Target Date: 05/27/16)   Baseline Min A with RW, AFO, and HO   Status On-going  progressing     PT SHORT TERM GOAL #2   Title Stand for at least 3" with intermittent UE support at counter without KAFO on RLE with SBA for incr. independence with ADL's.  (04-05-16)   Baseline 04/13/16: pt able to stand for >/= 3 minutes with intermittent support on walker with min guard assist to supervision   Status Achieved     PT SHORT TERM GOAL #3   Title Amb. 150' with LRAD with CGA on flat, even surface.  (Modified Target Date: 05/27/16)   Baseline Up to 59' with RW with right hand orthosis and AFO with min  guard assist.   Status On-going  progressing     PT SHORT TERM GOAL #4   Title Perform bed mobility including sit to/from supine with S.  (Modified Target Date: 05/27/16)   Baseline 04/29/16 min A for sit>supine   Status On-going     PT SHORT TERM GOAL #5   Title Independent in HEP for RLE strengthening.  (04-05-16)   Baseline reports compliance with bed level exercises 04/15/16   Time 4   Period Weeks   Status Achieved           PT Long Term Goals - 04/29/16 1848      PT LONG TERM GOAL #1   Title Modified independent with basic transfers.  Modified Target Date: 06/24/16)   Baseline Min/guard for stand pivot transfers with RW and HO 04/29/16   Time 8   Period Weeks   Status On-going     PT LONG TERM GOAL #2   Title Modified independent with bed mobility.  (Modified Target Date: 06/24/16)   Baseline Requires min A for safety of RLE 04/29/16   Time 8   Period Weeks   Status On-going     PT LONG TERM GOAL #3   Title Amb. 100' with LRAD with S for incr. household amb.  (Modified Target Date: 06/24/16)   Baseline Amb up to 63' w/ RW and R hand orthosis at min A level   Time 8   Period Weeks   Status On-going  and revised distance     PT LONG TERM GOAL #4   Title Amb. 1' with hemiwalker with CGA for incr. community accessibility.  (Modified Target Date: 06/24/16)   Time 8   Period Weeks   Status On-going     PT LONG TERM GOAL #5   Title Negotiate steps (4) with S with use of hand rail .  (Modified Target Date: 06/24/16)   Time 8   Period Weeks   Status New     PT LONG TERM GOAL #6   Title Perform Berg balance test when appropriate and establish goal as needed.  (05-04-16)   Baseline Will likely not be appropriate due to fall risk without device.    Time 8   Period Weeks   Status Deferred               Plan - 05/13/16 1550    Clinical Impression Statement Skilled session focused on continued practice of stair negotiation with therapist stating okay to D/C ramp as  pt feels comfortable and feel that wife can manage assisting  him (did on last visit).  Also continue to work on forced use and NMR for RLE/UE.     Rehab Potential Good   PT Frequency 2x / week   PT Duration 8 weeks   PT Treatment/Interventions ADLs/Self Care Home Management;DME Instruction;Balance training;Therapeutic exercise;Therapeutic activities;Functional mobility training;Stair training;Gait training;Neuromuscular re-education;Patient/family education;Orthotic Fit/Training;Passive range of motion   PT Next Visit Plan (See if they got rid of ramp), Sit > stand from lower height (to simulate home toilet). Continue R NMR, gait training, stair negotiation (6 STE home w/ R rail) and bed mobility in 30" height bed (without R AFO).    Consulted and Agree with Plan of Care Patient      Patient will benefit from skilled therapeutic intervention in order to improve the following deficits and impairments:  Abnormal gait, Decreased activity tolerance, Decreased balance, Decreased coordination, Decreased endurance, Decreased mobility, Decreased range of motion, Decreased strength, Impaired tone, Impaired UE functional use, Obesity  Visit Diagnosis: Other abnormalities of gait and mobility  Hemiplegia and hemiparesis following cerebral infarction affecting right non-dominant side (HCC)  Muscle weakness (generalized)  Unsteadiness on feet       G-Codes - 2016/05/18 1555    Functional Assessment Tool Used Clinical judgement:  Pt requires S to min/guard (pending fatigue) with RW and R hand orthosis, S for transfers, min A bed mobility.     Functional Limitation Mobility: Walking and moving around   Mobility: Walking and Moving Around Current Status [phone]) At least 40 percent but less than 60 percent impaired, limited or restricted   Mobility: Walking and Moving Around Goal Status [phone]) At least 20 percent but less than 40 percent impaired, limited or restricted  updated due to expected progress      Physical Therapy Progress Note  Dates of Reporting Period: 04/09/16 to 05-18-2016  Objective Reports of Subjective Statement: See above.   Objective Measurements: See G code above  Goal Update: See above  Plan: continue current POC.   Reason Skilled Services are Required: Continues to have significant R hemiplegia, decreased balance, decreased attention and decreased endurance.        Problem List Patient Active Problem List   Diagnosis Date Noted  . Slow transit constipation   . Neuropathic pain   . Stroke, acute, thrombotic (HCC) 10/08/2015  . 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 Essential hypertension 10/08/2015  . HLD (hyperlipidemia) 10/08/2015  . Hemiplegia and hemiparesis following cerebral infarction affecting right dominant side (HCC)   . Right hemiplegia (HCC) 10/06/2015  . Stroke (cerebrum) (HCC) 10/05/2015  . CVA (cerebral infarction) 10/05/2015    Harriet Butte, PT, MPT [facility] [address] [place], [place], [address] Phone: [phone]   Fax:  [phone] 05/18/16, 4:04 PM  Name: Sean Wilkerson MRN: [id number] Date of Birth: April 06, 1946

## 2016-05-18 ENCOUNTER — Ambulatory Visit: Payer: Medicare Other | Admitting: Occupational Therapy

## 2016-05-18 ENCOUNTER — Ambulatory Visit: Payer: Medicare Other | Attending: Neurology | Admitting: Physical Therapy

## 2016-05-18 ENCOUNTER — Encounter: Payer: Self-pay | Admitting: Occupational Therapy

## 2016-05-18 ENCOUNTER — Encounter: Payer: Self-pay | Admitting: Physical Therapy

## 2016-05-18 DIAGNOSIS — R278 Other lack of coordination: Secondary | ICD-10-CM | POA: Insufficient documentation

## 2016-05-18 DIAGNOSIS — M6281 Muscle weakness (generalized): Secondary | ICD-10-CM | POA: Diagnosis present

## 2016-05-18 DIAGNOSIS — I69353 Hemiplegia and hemiparesis following cerebral infarction affecting right non-dominant side: Secondary | ICD-10-CM

## 2016-05-18 DIAGNOSIS — R2689 Other abnormalities of gait and mobility: Secondary | ICD-10-CM

## 2016-05-18 DIAGNOSIS — R2681 Unsteadiness on feet: Secondary | ICD-10-CM

## 2016-05-18 DIAGNOSIS — R471 Dysarthria and anarthria: Secondary | ICD-10-CM | POA: Diagnosis present

## 2016-05-18 NOTE — Therapy (Signed)
[facility] [address] [place], [place], [address] Phone: [phone]   Fax:  [phone]  Occupational Therapy Treatment  Patient Details  Name: Sean Wilkerson MRN: [id number] Date of Birth: 10/29/1945 Referring Provider: Dr. Pearlean Brownie  Encounter Date: 05/18/2016      OT End of Session - 05/18/16 1655    Visit Number 19   Number of Visits 32   Date for OT Re-Evaluation 06/24/16   Authorization Type [employer] MCR   Authorization - Visit Number 19   Authorization - Number of Visits 32   OT Start Time 1535   OT Stop Time 1620   OT Time Calculation (min) 45 min   Activity Tolerance Patient tolerated treatment well   Behavior During Therapy [facility] for tasks assessed/performed      Past Medical History:  Diagnosis Date  . HLD (hyperlipidemia) 10/08/2015  . HTN (hypertension) 10/08/2015  . Hypertension   . Stroke [facility])     Past Surgical History:  Procedure Laterality Date  . NO PAST SURGERIES      There were no vitals filed for this visit.      Subjective Assessment - 05/18/16 1637    Subjective  I am doing more for myself. (regarding dressing)   Pertinent History see Epic   Patient Stated Goals to be more indpendent   Currently in Pain? No/denies   Pain Score 0-No pain                      OT Treatments/Exercises (OP) - 05/18/16 0001      ADLs   ADL Comments Patient reports that dressing is going "much better"  He practiced upper and lower body dressing last session, and feels he is doing more for himself.  Wife not present to verify.       Neurological Re-education Exercises   Other Exercises 1 Neuromuscular reeducation to address awareness, and activation of right side in quadruped.  Patient able to transition to 4 pt from standing with mod assist, second helper needed to assist right leg onto mat table.  Patient with increased tension in this position, and had report of right wrist  discomfort - minimal weight accepted onto right arm.  Transitioned to modified plantigrade - with UE's in weight bearing.  Decreased pain in this position.  Patient able to follow active weight bearing with modified closed chain movement in shoulder flexion,extension, adduction.  In sitting worked on active weight bearing through long arm - without wrist pain.  Patient able to sustain right hand position - accept body weight through long arm with support at elbow joint, and lift off surface from sit to squat.  Followed with active shoulder and elbow movement.  Worked on increasing tension in right forearm and hand for grasp and release of 1 inch blocks.                  OT Education - 05/18/16 1648    Education provided Yes   Education Details The importance of body alignment for activation of right side extremities, the importance of attention to achieve any activation on right side   Person(s) Educated Patient   Methods Explanation   Comprehension Verbalized understanding          OT Short Term Goals - 05/11/16 1305      OT SHORT TERM GOAL #1   Title I with inital HEP.- check 04/03/16   Status Achieved     OT  SHORT TERM GOAL #2   Title Pt will demonstrate understanding of RUE positioning to minimize pain and risk for injury including splint wear PRN.   Status Achieved     OT SHORT TERM GOAL #3   Title Pt will consistently donn shirt with min A.   Status Achieved     OT SHORT TERM GOAL #4   Title Pt will perform toilet transfers with min A.   Status Achieved     OT SHORT TERM GOAL #5   Title Pt will donn pants with min A using AE PRN.   Status Achieved     OT SHORT TERM GOAL #6   Title Pt will  perform shower transfer and bathe with min A.- 05/27/2016   Status Achieved     OT SHORT TERM GOAL #7   Status Achieved           OT Long Term Goals - 05/18/16 1700      OT LONG TERM GOAL #5   Title Pt will demonstrate P/ROM shoulder flexion to 100* with pain no  greater than 3/10.   Status Achieved               Plan - 05/18/16 1657    Clinical Impression Statement Patient showing steady improvement with basic ADL and functional mobility.  Patient with increased muscle activation in right arm although this movement is not yet functional.     Rehab Potential Good   Clinical Impairments Affecting Rehab Potential Potential for depression- will monitor and offer support, resources as warranted   OT Frequency 2x / week   OT Duration 8 weeks   OT Treatment/Interventions Self-care/ADL training;Therapeutic exercise;Patient/family education;Balance training;Splinting;Neuromuscular education;Ultrasound;Energy conservation;Therapeutic exercises;Therapeutic activities;DME and/or AE instruction;Parrafin;Cryotherapy;Electrical Stimulation;Fluidtherapy;Cognitive remediation/compensation;Visual/perceptual remediation/compensation;Passive range of motion;Contrast Bath;Moist Heat   Plan NMR RUE, Functional mobility   Consulted and Agree with Plan of Care Patient      Patient will benefit from skilled therapeutic intervention in order to improve the following deficits and impairments:  Decreased coordination, Decreased range of motion, Increased edema, Decreased safety awareness, Decreased endurance, Decreased activity tolerance, Decreased knowledge of precautions, Impaired tone, Obesity, Pain, Impaired UE functional use, Decreased knowledge of use of DME, Decreased balance, Decreased cognition, Decreased mobility, Decreased strength, Impaired perceived functional ability, Impaired vision/preception  Visit Diagnosis: 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 Slow transit constipation   . Neuropathic pain   . Stroke, acute, thrombotic (HCC)  10/08/2015  . 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 Essential hypertension 10/08/2015  . HLD (hyperlipidemia) 10/08/2015  . Hemiplegia and hemiparesis following cerebral infarction affecting right dominant side (HCC)   . Right hemiplegia (HCC) 10/06/2015  . Stroke (cerebrum) (HCC) 10/05/2015  . CVA (cerebral infarction) 10/05/2015    Collier Salina 05/18/2016, Deno Etienne PM  [facility] [address] [place], [place], [address] Phone: [phone]   Fax:  [phone]  Name: Sean Wilkerson MRN: [id number] Date of Birth: [birth year]

## 2016-05-19 NOTE — Therapy (Signed)
[facility] [address] [place], [place], [address] Phone: [phone]   Fax:  [phone]  Physical Therapy Treatment  Patient Details  Name: Sean Wilkerson MRN: [id number] Date of Birth: [birth year] Referring Provider: Dr. Delia Heady  Encounter Date: 05/18/2016      PT End of Session - 05/18/16 1454    Visit Number 21   Number of Visits 33   Date for PT Re-Evaluation 06/28/16   Authorization Type U[employer] Medicare   Authorization Time Period 04/29/16-06/28/16   PT Start Time 1449   PT Stop Time 1530   PT Time Calculation (min) 41 min   Equipment Utilized During Treatment Gait belt   Activity Tolerance Patient tolerated treatment well   Behavior During Therapy [facility] for tasks assessed/performed      Past Medical History:  Diagnosis Date  . HLD (hyperlipidemia) 10/08/2015  . HTN (hypertension) 10/08/2015  . Hypertension   . Stroke [facility])     Past Surgical History:  Procedure Laterality Date  . NO PAST SURGERIES      There were no vitals filed for this visit.      Subjective Assessment - 05/18/16 1453    Subjective No new compliants. No falls or pain to report. Walking more at home with spouse per pt report. In the process of calling company to come get ramp. Did stairs with spouse at house with no issues reported.     Patient is accompained by: Family member   Pertinent History L CVA 10-05-15 - hospitalized until Jan - D/C'd home with Home Health PT, OT ST - Home health finished up on 02-25-16   Patient Stated Goals "I want to walk without anything"   Currently in Pain? No/denies   Pain Score 0-No pain        05/18/16 1624  Bed Mobility  Bed Mobility Supine to Sit;Sit to Supine  Supine to Sit 5: Supervision;HOB flat;4: Min assist  Supine to Sit Details (indicate cue type and reason) progressed from min assist to help manage right leg to supervision with pt self managing right leg using left leg while  lying down toward left side (way he would sleep in home bed)  Sitting - Scoot to Edge of Bed 5: Supervision;4: Min assist  Sitting - Scoot to Edge of Bed Details (indicate cue type and reason) supervision on mat table, min assist with added double blue foam mat to simulated pillow top mattress  Sit to Supine 5: Supervision  Sit to Supine - Details (indicate cue type and reason) cues to use left leg to assist with lifting right leg onto bed, progressed from min assist to supervision with bed elevated to home bed height                                  Transfers  Transfers Sit to Stand;Stand to Sit  Sit to Stand 4: Min assist;From elevated surface;With upper extremity assist  Sit to Stand Details Verbal cues for safe use of DME/AE;Verbal cues for precautions/safety  Stand to Sit 4: Min assist;Without upper extremity assist (to elevated surface)  Stand to Sit Details (indicate cue type and reason) Verbal cues for precautions/safety;Verbal cues for safe use of DME/AE  Comments sit<>stand from mat table elevated to pt;s home bed height: x1 rep using 6 inch high step stool and walker with 2 person min assist for safety. x3 reps with no step  stool needing 2 person min assist for safety, assist to keep pt from sliding off edge of mat. x1 rep with 4 inch step stool, 1 person min assist. added foldled blue mat to top off mat table to further simulate softness of pt's pillow top mattress- x2 reps with 4 inch stool and walker needing increased assist and time to move himself over softer surfaces.                                        Ambulation/Gait  Ambulation/Gait Yes  Ambulation/Gait Assistance 5: Supervision  Ambulation/Gait Assistance Details cues on posture and to correct notable gait devitions  Ambulation Distance (Feet) 30 Feet  Assistive device Rolling walker (with R h/o;  R AFO)  Gait Pattern Decreased hip/knee flexion - right;Decreased weight shift to right;Trunk rotated posteriorly on  right;Step-through pattern;Decreased step length - right (R pelvic retraction during R stance)  Ambulation Surface Level;Indoor            PT Short Term Goals - 04/29/16 1851      PT SHORT TERM GOAL #1   Title Pt will perform basic transfers with S only - wheelchair to/from mat.  (modified Target Date: 05/27/16)   Baseline Min A with RW, AFO, and HO   Status On-going  progressing     PT SHORT TERM GOAL #2   Title Stand for at least 3" with intermittent UE support at counter without KAFO on RLE with SBA for incr. independence with ADL's.  (04-05-16)   Baseline 04/13/16: pt able to stand for >/= 3 minutes with intermittent support on walker with min guard assist to supervision   Status Achieved     PT SHORT TERM GOAL #3   Title Amb. 150' with LRAD with CGA on flat, even surface.  (Modified Target Date: 05/27/16)   Baseline Up to 18' with RW with right hand orthosis and AFO with min guard assist.   Status On-going  progressing     PT SHORT TERM GOAL #4   Title Perform bed mobility including sit to/from supine with S.  (Modified Target Date: 05/27/16)   Baseline 04/29/16 min A for sit>supine   Status On-going     PT SHORT TERM GOAL #5   Title Independent in HEP for RLE strengthening.  (04-05-16)   Baseline reports compliance with bed level exercises 04/15/16   Time 4   Period Weeks   Status Achieved           PT Long Term Goals - 04/29/16 1848      PT LONG TERM GOAL #1   Title Modified independent with basic transfers.  Modified Target Date: 06/24/16)   Baseline Min/guard for stand pivot transfers with RW and HO 04/29/16   Time 8   Period Weeks   Status On-going     PT LONG TERM GOAL #2   Title Modified independent with bed mobility.  (Modified Target Date: 06/24/16)   Baseline Requires min A for safety of RLE 04/29/16   Time 8   Period Weeks   Status On-going     PT LONG TERM GOAL #3   Title Amb. 100' with LRAD with S for incr. household amb.  (Modified Target Date:  06/24/16)   Baseline Amb up to 83' w/ RW and R hand orthosis at min A level   Time 8   Period Weeks   Status  On-going  and revised distance     PT LONG TERM GOAL #4   Title Amb. 240' with hemiwalker with CGA for incr. community accessibility.  (Modified Target Date: 06/24/16)   Time 8   Period Weeks   Status On-going     PT LONG TERM GOAL #5   Title Negotiate steps (4) with S with use of hand rail .  (Modified Target Date: 06/24/16)   Time 8   Period Weeks   Status New     PT LONG TERM GOAL #6   Title Perform Berg balance test when appropriate and establish goal as needed.  (05-04-16)   Baseline Will likely not be appropriate due to fall risk without device.    Time 8   Period Weeks   Status Deferred            Plan - 05/18/16 1454    Clinical Impression Statement Today's session focused on bed mobility and transfers to/from high mat to simulate pt's home bed. Discussed use of and practiced with use of foot stool to assist pt with sitting balance at edge of bed and to allow pt to safety transfer in/out of bed. Best transfer was with use of 4 inch stool and walker. Pt will need continued practice with this before safe to do at home. Would also like to train spouse how to assist/guard pt as well. Pt is making steady progress toward goals and would continue to benefit from skilled PT to progress toward unmet goals.                                           Rehab Potential Good   PT Frequency 2x / week   PT Duration 8 weeks   PT Treatment/Interventions ADLs/Self Care Home Management;DME Instruction;Balance training;Therapeutic exercise;Therapeutic activities;Functional mobility training;Stair training;Gait training;Neuromuscular re-education;Patient/family education;Orthotic Fit/Training;Passive range of motion   PT Next Visit Plan sit<>stands from lower heights to simulate home toilet, sit<>stands from elevated mat (30 inches with blue foam mat on at top of 30 inches- used this to  simulate texture of pillow top mattress) using 4 inch step stool/RW, NMR to right leg, and gait training     Consulted and Agree with Plan of Care Patient      Patient will benefit from skilled therapeutic intervention in order to improve the following deficits and impairments:  Abnormal gait, Decreased activity tolerance, Decreased balance, Decreased coordination, Decreased endurance, Decreased mobility, Decreased range of motion, Decreased strength, Impaired tone, Impaired UE functional use, Obesity  Visit Diagnosis: Other abnormalities of gait and mobility  Unsteadiness on feet  Muscle weakness (generalized)     Problem List Patient Active Problem List   Diagnosis Date Noted  . Slow transit constipation   . Neuropathic pain   . Stroke, acute, thrombotic (HCC) 10/08/2015  . 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 Essential hypertension 10/08/2015  . HLD (hyperlipidemia) 10/08/2015  . Hemiplegia and hemiparesis following cerebral infarction affecting right dominant side (HCC)   . Right hemiplegia (HCC) 10/06/2015  . Stroke (cerebrum) (HCC) 10/05/2015  . CVA (cerebral infarction) 10/05/2015    Sallyanne Kuster, PTA, [facility] [address], [address] [place], [place] [address] [phone] 05/19/16, 4:11 PM   Name: Beckem Tomberlin MRN: [id number] Date of Birth: 1946-04-03

## 2016-05-20 ENCOUNTER — Ambulatory Visit: Payer: Medicare Other

## 2016-05-20 ENCOUNTER — Ambulatory Visit: Payer: Medicare Other | Admitting: Occupational Therapy

## 2016-05-20 ENCOUNTER — Ambulatory Visit: Payer: Medicare Other | Admitting: Physical Therapy

## 2016-05-20 ENCOUNTER — Encounter: Payer: Self-pay | Admitting: Occupational Therapy

## 2016-05-20 DIAGNOSIS — R2681 Unsteadiness on feet: Secondary | ICD-10-CM

## 2016-05-20 DIAGNOSIS — R2689 Other abnormalities of gait and mobility: Secondary | ICD-10-CM | POA: Diagnosis not present

## 2016-05-20 DIAGNOSIS — I69353 Hemiplegia and hemiparesis following cerebral infarction affecting right non-dominant side: Secondary | ICD-10-CM

## 2016-05-20 DIAGNOSIS — R278 Other lack of coordination: Secondary | ICD-10-CM

## 2016-05-20 DIAGNOSIS — R471 Dysarthria and anarthria: Secondary | ICD-10-CM

## 2016-05-20 DIAGNOSIS — M6281 Muscle weakness (generalized): Secondary | ICD-10-CM

## 2016-05-20 NOTE — Therapy (Signed)
[facility] [address] [place], [place], [address] Phone: [phone]   Fax:  [phone]  Speech Language Pathology Treatment  Patient Details  Name: Sean Wilkerson MRN: [id number] Date of Birth: Feb 27, 1946 Referring Provider: Antony Contras  Encounter Date: 05/20/2016      End of Session - 05/20/16 1701    Visit Number 15   Number of Visits 17   Date for SLP Re-Evaluation 06/16/16   SLP Start Time 37   SLP Stop Time  1402   SLP Time Calculation (min) 42 min   Activity Tolerance Patient tolerated treatment well      Past Medical History:  Diagnosis Date  . HLD (hyperlipidemia) 10/08/2015  . HTN (hypertension) 10/08/2015  . Hypertension   . Stroke [facility])     Past Surgical History:  Procedure Laterality Date  . NO PAST SURGERIES      There were no vitals filed for this visit.      Subjective Assessment - 05/20/16 1325    Subjective Pts speech intelligibility remains reduced.    Currently in Pain? No/denies               ADULT SLP TREATMENT - 05/20/16 1349      General Information   Behavior/Cognition Alert;Cooperative;Pleasant mood     Treatment Provided   Treatment provided Cognitive-Linquistic     Cognitive-Linquistic Treatment   Treatment focused on Dysarthria   Skilled Treatment ADequate respiratory muscle training reps 18/25. SLP performed diagnostic therapy today with pt's voice. Neck massage/palpation today for 45-60 seconds made little difference in pt's voice quality, however when SLP told pt to "relax (his) voice" slightly better quality voice ensued (mod hoarseness). SLP facilitated this more relaxed voice by having pt repeat nasal-laden words with /u/ and /o/ vowels, and phrases with nasal-laden words. Pt's voice remained at mod hoarse approx 75% of the time, but with more consistent voicing. Sustained /a/ was never >10 seconds with full breath. s/z ratio unable to be taken  accurately due to lisp.  Loud "Hey!" with max hoarse voice.  Long discussion re: both good quality voice and speech at [facility] pace are needed for listener intelligibility to incr. for pt's speech.     Assessment / Recommendations / Plan   Plan Continue with current plan of care;Consult other service (comment)  ENT for vocal fold movement assessment(?)     Progression Toward Goals   Progression toward goals --  pt is motivated for positive change          SLP Education - 05/20/16 1700    Education provided Yes   Education Details voice quality and rate of speech both important in listener intelligibilty of pt's speech   Person(s) Educated Patient   Methods Explanation;Demonstration   Comprehension Verbalized understanding;Returned demonstration          SLP Short Term Goals - 05/13/16 1459      SLP SHORT TERM GOAL #1   Title pt will demo 23/25 reps of EMST with proper procedure over 4 sessions   Status Not Met     SLP SHORT TERM GOAL #2   Title pt will demo oral-motor HEP with rare min A    Status Partially Met     SLP SHORT TERM GOAL #3   Title pt will demo 70dB average volume in 5 minutes simple conversation over three sessions   Status Not Met          SLP Long Term Goals -  05/20/16 1704      SLP LONG TERM GOAL #1   Title pt will demo 22/25 reps of EMST with appropriate procedure over 4 sessions   Baseline one session 04-22-16   Time 2   Period Weeks   Status Not Met     SLP LONG TERM GOAL #2   Title pt will demo oral motor HEP with rare min A over three sessions   Time 2   Period Weeks   Status Partially Met     SLP LONG TERM GOAL #3   Title pt will demo reduced rate of speech in 5 minutes simple conversation adequate for 95% intelligibility over 3 sessions   Time 2   Period Weeks   Status Not Met          Plan - 05/20/16 1702    Clinical Impression Statement 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 Pt continues to  require usual min-mod A both for carryover of slow rate into structured sentence tasks, and for full breath prior to respiratory muscle training reps. Skilled ST recommended to continue to focus on maximizing intellgibility by rate reduction in structured tasks leading to simple conversation.   Speech Therapy Frequency 2x / week   Duration 2 weeks   Treatment/Interventions Oral motor exercises;Compensatory strategies;SLP instruction and feedback;Patient/family education;Functional tasks;Cueing hierarchy   Potential Considerations Severity of impairments      Patient will benefit from skilled therapeutic intervention in order to improve the following deficits and impairments:   Dysarthria and anarthria    Problem List Patient Active Problem List   Diagnosis Date Noted  . Slow transit constipation   . Neuropathic pain   . Stroke, acute, thrombotic ([place]) 10/08/2015  . Hemiparesis, aphasia, and dysphagia as late effect of cerebrovascular accident (CVA) ([place]) 10/08/2015  . Essential hypertension 10/08/2015  . HLD (hyperlipidemia) 10/08/2015  . Hemiplegia and hemiparesis following cerebral infarction affecting right dominant side ([place])   . Right hemiplegia ([place]) 10/06/2015  . Stroke (cerebrum) ([place]) 10/05/2015  . CVA (cerebral infarction) 10/05/2015    [facility] ,[place], [place]  05/20/2016, 5:04 PM  [place] [address] [place], [place], [address] Phone: [phone]   Fax:  [phone]   Name: Sean Wilkerson MRN: [id number] Date of Birth: 1946-03-06

## 2016-05-20 NOTE — Therapy (Signed)
[place] [address] [place], [place], [address] Phone: [phone]   Fax:  [phone]  Physical Therapy Treatment  Patient Details  Name: Sean Wilkerson MRN: [id number] Date of Birth: 1946/06/18 Referring Provider: Dr. Antony Contras  Encounter Date: 05/20/2016      PT End of Session - 05/20/16 1644    Visit Number 22   Number of Visits 33   Date for PT Re-Evaluation 06/28/16   Authorization Type UHC Medicare   Authorization Time Period 04/29/16-06/28/16   PT Start Time 1447   PT Stop Time 1530   PT Time Calculation (min) 43 min   Equipment Utilized During Treatment Gait belt   Activity Tolerance Patient tolerated treatment well;Other (comment)  Continues to require rest breaks, but activity tolerance improved this session   Behavior During Therapy [facility] for tasks assessed/performed      Past Medical History:  Diagnosis Date  . HLD (hyperlipidemia) 10/08/2015  . HTN (hypertension) 10/08/2015  . Hypertension   . Stroke [facility])     Past Surgical History:  Procedure Laterality Date  . NO PAST SURGERIES      There were no vitals filed for this visit.      Subjective Assessment - 05/20/16 1452    Subjective When asked about home walking program, pt states, "We've been doing that. We're up to 6 minutes." Pt states, "We're trying to get rid of that ramp." Pt reports no significant changes, no falls.    Pertinent History L CVA 10-05-15 - hospitalized until Jan - D/C'd home with [place], [place] health finished up on 02-25-16   Patient Stated Goals "I want to walk without anything"   Currently in Pain? No/denies                         [facility] Adult PT Treatment/Exercise - 05/20/16 0001      Transfers   Transfers Sit to Stand;Stand to Sit   Sit to Stand 5: Supervision   Stand to Sit 6: Modified independent (Device/Increase time)     Ambulation/Gait   Ambulation/Gait Yes   Ambulation/Gait Assistance 5: Supervision;4: Min guard   Ambulation/Gait Assistance Details x155' (x7 consecutive minutes) with (S) for linear gait, CGA for 180-degree turn due to pt difficulty grading lateral weight shift to L side while turning to R side. Cueing to promote R pelvic protraction, full lateral weight shift to R side, B hip extension, and for paced breathing during ambulation.  Gait x20' then x16' without AD with min A for linear gait, mod A for turning, multimodal cueing as described in addition to verbal/visual cueing for RLE step placement (as pt exhibits increased RLE ADD when not using AD). Ambulated x145' (from gym to waiting room) with RW, R h/o, and (S), cueing as aforementioned.   Ambulation Distance (Feet) 336 Feet  total; longest consecutive trial = 155'   Assistive device Rolling walker  with R h/o;  R AFO   Gait Pattern Decreased hip/knee flexion - right;Decreased weight shift to right;Trunk rotated posteriorly on right;Step-through pattern;Decreased step length - right  R pelvic retraction during R stance                  PT Short Term Goals - 05/20/16 1645      PT SHORT TERM GOAL #1   Title Pt will perform basic transfers with S only - wheelchair to/from mat.  (modified Target Date:  05/27/16)   Baseline Min A with RW, AFO, and HO   Status On-going  progressing     PT SHORT TERM GOAL #2   Title Stand for at least 3" with intermittent UE support at counter without KAFO on RLE with SBA for incr. independence with ADL's.  (04-05-16)   Baseline 04/13/16: pt able to stand for >/= 3 minutes with intermittent support on walker with min guard assist to supervision   Status Achieved     PT SHORT TERM GOAL #3   Title Amb. 150' with LRAD with CGA on flat, even surface.  (Modified Target Date: 05/27/16)   Baseline Met 8/3.   Status Achieved  progressing     PT SHORT TERM GOAL #4   Title Perform bed mobility including sit to/from supine with S.  (Modified Target  Date: 05/27/16)   Baseline 04/29/16 min A for sit>supine   Status On-going     PT SHORT TERM GOAL #5   Title Independent in HEP for RLE strengthening.  (04-05-16)   Baseline reports compliance with bed level exercises 04/15/16   Time 4   Period Weeks   Status Achieved           PT Long Term Goals - 04/29/16 1848      PT LONG TERM GOAL #1   Title Modified independent with basic transfers.  Modified Target Date: 06/24/16)   Baseline Min/guard for stand pivot transfers with RW and HO 04/29/16   Time 8   Period Weeks   Status On-going     PT LONG TERM GOAL #2   Title Modified independent with bed mobility.  (Modified Target Date: 06/24/16)   Baseline Requires min A for safety of RLE 04/29/16   Time 8   Period Weeks   Status On-going     PT LONG TERM GOAL #3   Title Amb. 100' with LRAD with S for incr. household amb.  (Modified Target Date: 06/24/16)   Baseline Amb up to 78' w/ RW and R hand orthosis at min A level   Time 8   Period Weeks   Status On-going  and revised distance     PT LONG TERM GOAL #4   Title Amb. 31' with hemiwalker with CGA for incr. community accessibility.  (Modified Target Date: 06/24/16)   Time 8   Period Weeks   Status On-going     PT LONG TERM GOAL #5   Title Negotiate steps (4) with S with use of hand rail .  (Modified Target Date: 06/24/16)   Time 8   Period Weeks   Status New     PT LONG TERM GOAL #6   Title Perform Berg balance test when appropriate and establish goal as needed.  (05-04-16)   Baseline Will likely not be appropriate due to fall risk without device.    Time 8   Period Weeks   Status Deferred               Plan - 05/20/16 1646    Clinical Impression Statement Session focused on gait training with emphasis on ambulation without AD. Pt has been consistently performing walking program at home with wife, and was able to ambulate for 7 consecutive minutes prior to rest break. Met STG for gait x150' with CGA using LRAD (pt  supervision for at least 95% of gait trial). During gait without AD, pt exhibits increased RLE adduction and decreased grading/control of lateral weight shift to L side.   Rehab  Potential Good   PT Frequency 2x / week   PT Duration 8 weeks   PT Treatment/Interventions ADLs/Self Care Home Management;DME Instruction;Balance training;Therapeutic exercise;Therapeutic activities;Functional mobility training;Stair training;Gait training;Neuromuscular re-education;Patient/family education;Orthotic Fit/Training;Passive range of motion   PT Next Visit Plan * Walk into therapy gym from waiting room. Continue checking STG's. Short-distance gait without AD (if safe). NMR with emphasis on midline orientation, grading of lateral weight shift to L side, promoting lateral weight shift to R side.   Consulted and Agree with Plan of Care Patient      Patient will benefit from skilled therapeutic intervention in order to improve the following deficits and impairments:  Abnormal gait, Decreased activity tolerance, Decreased balance, Decreased coordination, Decreased endurance, Decreased mobility, Decreased range of motion, Decreased strength, Impaired tone, Impaired UE functional use, Obesity  Visit Diagnosis: Hemiplegia and hemiparesis following cerebral infarction affecting right non-dominant side (HCC)  Unsteadiness on feet  Other abnormalities of gait and mobility     Problem List Patient Active Problem List   Diagnosis Date Noted  . Slow transit constipation   . Neuropathic pain   . Stroke, acute, thrombotic ([place]) 10/08/2015  . Hemiparesis, aphasia, and dysphagia as late effect of cerebrovascular accident (CVA) ([place]) 10/08/2015  . Essential hypertension 10/08/2015  . HLD (hyperlipidemia) 10/08/2015  . Hemiplegia and hemiparesis following cerebral infarction affecting right dominant side ([place])   . Right hemiplegia ([place]) 10/06/2015  . Stroke (cerebrum) ([place]) 10/05/2015  . CVA (cerebral infarction)  10/05/2015   Billie Ruddy, PT, DPT [facility] [address] [place], [place], [address] Phone: [phone]   Fax:  [phone] 05/20/16, 4:49 PM  Name: Sean Wilkerson MRN: [id number] Date of Birth: [birth year]

## 2016-05-20 NOTE — Therapy (Signed)
[facility] [address] [place], [place], [address] Phone: [phone]   Fax:  [phone]  Occupational Therapy Treatment  Patient Details  Name: Jamille Yoshino MRN: [id number] Date of Birth: [birth year] Referring Provider: Dr. Leonie Man  Encounter Date: 05/20/2016      OT End of Session - 05/20/16 1602    OT Start Time 1403   OT Stop Time 1446   OT Time Calculation (min) 43 min   Activity Tolerance Patient tolerated treatment well   Behavior During Therapy [facility] for tasks assessed/performed      Past Medical History:  Diagnosis Date  . HLD (hyperlipidemia) 10/08/2015  . HTN (hypertension) 10/08/2015  . Hypertension   . Stroke [facility])     Past Surgical History:  Procedure Laterality Date  . NO PAST SURGERIES      There were no vitals filed for this visit.      Subjective Assessment - 05/20/16 1539    Subjective  "Tell me the truth, am I doing better?"   Pertinent History see Epic   Patient Stated Goals to be more indpendent   Currently in Pain? No/denies   Pain Score 0-No pain                      OT Treatments/Exercises (OP) - 05/20/16 1540      Neurological Re-education Exercises   Other Exercises 1 Neuromuscular reeducation to address acrtive movement in right upper extremity.  In supine, patient demonstrated active protraction / retraction for the first time.  Patient able to sustain contraction of right shoulder muscles for several seconds versus only brief bursts of action.  Worked in sidelying on right side, to activate scapular stabilizers for more sustained periods of time.  Followed with forced use of arm up in standing on supported surface.  Patient requires constant cueing to maintain activation throughout right side, e.g. on right leg in standing for optimal activation in right arm,                    OT Short Term Goals - 05/20/16 1600      OT SHORT TERM GOAL #1    Title I with inital HEP.- check 04/03/16   Status Achieved     OT SHORT TERM GOAL #2   Title Pt will demonstrate understanding of RUE positioning to minimize pain and risk for injury including splint wear PRN.   Status Achieved     OT SHORT TERM GOAL #3   Title Pt will consistently donn shirt with min A.   Status Achieved     OT SHORT TERM GOAL #4   Title Pt will perform toilet transfers with min A.   Status Achieved     OT SHORT TERM GOAL #5   Title Pt will donn pants with min A using AE PRN.   Status Achieved     OT SHORT TERM GOAL #6   Title Pt will  perform shower transfer and bathe with min A.- 05/27/2016   Status Achieved     OT SHORT TERM GOAL #7   Title Pt will tolerate P/ROM shoulder flexion to 90* with no significant increase in pain.   Status Achieved           OT Long Term Goals - 05/20/16 1600      OT LONG TERM GOAL #1   Title Pt will donn shirt with set up only.- check 06/24/2016   Status On-going  OT LONG TERM GOAL #2   Title Pt will donn pants with minguard for balance.   Status On-going     OT LONG TERM GOAL #3   Title Pt will donn socks and shoes with min A.   Status On-going     OT LONG TERM GOAL #4   Title Pt will use RUE as a stabilizer for ADLS at least 25 % of the time with pain less than or equal to 3/10.   Status On-going     OT LONG TERM GOAL #5   Title Pt will demonstrate P/ROM shoulder flexion to 100* with pain no greater than 3/10.   Status Achieved     OT LONG TERM GOAL #6   Title Pt will demonstrate ability to stand at countertop and retrieve an item from midlevel shelf with LUE, with supervision and no LOB.   Status Achieved               Plan - May 21, 2016 1558    Clinical Impression Statement Patient showing slow but steady improvement in active movement in right UE, and is needing less assistance for nasic self care skills.  Patient with significant improvement in functional mobility - now walking with rolling  walker and walker splint.    Rehab Potential Good   Clinical Impairments Affecting Rehab Potential Potential for depression- will monitor and offer support, resources as warranted   OT Frequency 2x / week   OT Duration 8 weeks   OT Treatment/Interventions Self-care/ADL training;Therapeutic exercise;Patient/family education;Balance training;Splinting;Neuromuscular education;Ultrasound;Energy conservation;Therapeutic exercises;Therapeutic activities;DME and/or AE instruction;Parrafin;Cryotherapy;Electrical Stimulation;Fluidtherapy;Cognitive remediation/compensation;Visual/perceptual remediation/compensation;Passive range of motion;Contrast Bath;Moist Heat   Plan NMR RUE, functional mobility   Consulted and Agree with Plan of Care Patient      Patient will benefit from skilled therapeutic intervention in order to improve the following deficits and impairments:  Decreased coordination, Decreased range of motion, Increased edema, Decreased safety awareness, Decreased endurance, Decreased activity tolerance, Decreased knowledge of precautions, Impaired tone, Obesity, Pain, Impaired UE functional use, Decreased knowledge of use of DME, Decreased balance, Decreased cognition, Decreased mobility, Decreased strength, Impaired perceived functional ability, Impaired vision/preception  Visit Diagnosis: Hemiplegia and hemiparesis following cerebral infarction affecting right non-dominant side (HCC)  Muscle weakness (generalized)  Unsteadiness on feet  Other lack of coordination      G-Codes - 05/21/2016 1601    Functional Assessment Tool Used clinical impresssions /ADL status   Functional Limitation Self care   Self Care Current Status (C0161) At least 40 percent but less than 60 percent impaired, limited or restricted   Self Care Goal Status (Y2442) At least 20 percent but less than 40 percent impaired, limited or restricted      Problem List Patient Active Problem List   Diagnosis Date Noted  .  Slow transit constipation   . Neuropathic pain   . Stroke, acute, thrombotic (HCC) 10/08/2015  . 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 Essential hypertension 10/08/2015  . HLD (hyperlipidemia) 10/08/2015  . Hemiplegia and hemiparesis following cerebral infarction affecting right dominant side (HCC)   . Right hemiplegia (HCC) 10/06/2015  . Stroke (cerebrum) (HCC) 10/05/2015  . CVA (cerebral infarction) 10/05/2015  Occupational Therapy Progress Note  Dates of Reporting Period: 04/15/16 to 05/21/2016   Goal Update: All short term goals met, working toward long term goals  Plan: See above, continue with present OT plan of care  Reason Skilled Services are Required: Patient making steady improvement with participation  in self care, and is seeing active movement returning to right UE/LE.    Mariah Milling, OTR/L 05/20/2016, 4:03 PM  [facility] [address] [place], [place], [address] Phone: [phone]   Fax:  [phone]  Name: Trea Latner MRN: [id number] Date of Birth: 03/14/1946

## 2016-05-24 ENCOUNTER — Ambulatory Visit: Payer: Medicare Other | Admitting: Occupational Therapy

## 2016-05-24 ENCOUNTER — Ambulatory Visit: Payer: Medicare Other | Admitting: Physical Therapy

## 2016-05-24 DIAGNOSIS — M6281 Muscle weakness (generalized): Secondary | ICD-10-CM

## 2016-05-24 DIAGNOSIS — R2689 Other abnormalities of gait and mobility: Secondary | ICD-10-CM | POA: Diagnosis not present

## 2016-05-24 DIAGNOSIS — R278 Other lack of coordination: Secondary | ICD-10-CM

## 2016-05-24 DIAGNOSIS — R2681 Unsteadiness on feet: Secondary | ICD-10-CM

## 2016-05-24 DIAGNOSIS — I69353 Hemiplegia and hemiparesis following cerebral infarction affecting right non-dominant side: Secondary | ICD-10-CM

## 2016-05-24 NOTE — Therapy (Signed)
[facility] [address] [place], [place], [address] Phone: [phone]   Fax:  [phone]  Occupational Therapy Treatment  Patient Details  Name: Sean Wilkerson MRN: [id number] Date of Birth: 1946/04/28 Referring Provider: Dr. Pearlean BrownieSethi  Encounter Date: 05/24/2016      OT End of Session - 05/24/16 1643    Visit Number 21   Number of Visits 32   Date for OT Re-Evaluation 06/24/16   Authorization Type UMR MCR   Authorization - Visit Number 21   Authorization - Number of Visits 32   OT Start Time 1447   OT Stop Time 1529   OT Time Calculation (min) 42 min   Activity Tolerance Patient limited by fatigue      Past Medical History:  Diagnosis Date  . HLD (hyperlipidemia) 10/08/2015  . HTN (hypertension) 10/08/2015  . Hypertension   . Stroke [facility](HCC)     Past Surgical History:  Procedure Laterality Date  . NO PAST SURGERIES      There were no vitals filed for this visit.      Subjective Assessment - 05/24/16 1449    Subjective  whew this is making me really tired   Pertinent History see Epic, L CVA   Patient Stated Goals to be more indpendent   Currently in Pain? No/denies                      OT Treatments/Exercises (OP) - 05/24/16 1637      Neurological Re-education Exercises   Other Exercises 1 Neuro re ed to address proximal shoulder stability, body moving on RUE while RUE in heavy weight bearing with facilitation for alignment in sidesitting. progressed to sitting with weight bearing and resistance with mod faciltation to increase proximal activitry at shoulder girdle. Transitioned to low unilateral reach in sitting closed chain on moveable surface - pt needed mod facilitation today. Then progressed into standing facing mat with RUE on large ball and worked on weight shifting to R, increasing RLE , trunk and RUE activation in standing and findin midline.  Lastly addressed functional  mobility with walker with emphasis on midline alignment, full weight shift to RLE when indicated, and better activation of RUE with use of walker. Pt very fatigued today.                  OT Short Term Goals - 05/24/16 1642      OT SHORT TERM GOAL #1   Title I with inital HEP.- check 04/03/16   Status Achieved     OT SHORT TERM GOAL #2   Title Pt will demonstrate understanding of RUE positioning to minimize pain and risk for injury including splint wear PRN.   Status Achieved     OT SHORT TERM GOAL #3   Title Pt will consistently donn shirt with min A.   Status Achieved     OT SHORT TERM GOAL #4   Title Pt will perform toilet transfers with min A.   Status Achieved     OT SHORT TERM GOAL #5   Title Pt will donn pants with min A using AE PRN.   Status Achieved     OT SHORT TERM GOAL #6   Title Pt will  perform shower transfer and bathe with min A.- 05/27/2016   Status Achieved     OT SHORT TERM GOAL #7   Title Pt will tolerate P/ROM shoulder flexion to 90* with no significant increase  in pain.   Status Achieved           OT Long Term Goals - 05/24/16 1642      OT LONG TERM GOAL #1   Title Pt will donn shirt with set up only.- check 06/24/2016   Status On-going     OT LONG TERM GOAL #2   Title Pt will donn pants with minguard for balance.   Status On-going     OT LONG TERM GOAL #3   Title Pt will donn socks and shoes with min A.   Status On-going     OT LONG TERM GOAL #4   Title Pt will use RUE as a stabilizer for ADLS at least 25 % of the time with pain less than or equal to 3/10.   Status On-going     OT LONG TERM GOAL #5   Title Pt will demonstrate P/ROM shoulder flexion to 100* with pain no greater than 3/10.   Status Achieved     OT LONG TERM GOAL #6   Title Pt will demonstrate ability to stand at countertop and retrieve an item from midlevel shelf with LUE, with supervision and no LOB.   Status Achieved               Plan - 05/24/16  1642    Clinical Impression Statement Pt wtih slow progression toward goals. Pt very fatigued today   Rehab Potential Good   Clinical Impairments Affecting Rehab Potential Potential for depression- will monitor and offer support, resources as warranted; severity of deficits   OT Frequency 2x / week   OT Duration 8 weeks   OT Treatment/Interventions Self-care/ADL training;Therapeutic exercise;Patient/family education;Balance training;Splinting;Neuromuscular education;Ultrasound;Energy conservation;Therapeutic exercises;Therapeutic activities;DME and/or AE instruction;Parrafin;Cryotherapy;Electrical Stimulation;Fluidtherapy;Cognitive remediation/compensation;Visual/perceptual remediation/compensation;Passive range of motion;Contrast Bath;Moist Heat   Plan NMR for RUE, functional mobility   Consulted and Agree with Plan of Care Patient      Patient will benefit from skilled therapeutic intervention in order to improve the following deficits and impairments:  Decreased coordination, Decreased range of motion, Increased edema, Decreased safety awareness, Decreased endurance, Decreased activity tolerance, Decreased knowledge of precautions, Impaired tone, Obesity, Pain, Impaired UE functional use, Decreased knowledge of use of DME, Decreased balance, Decreased cognition, Decreased mobility, Decreased strength, Impaired perceived functional ability, Impaired vision/preception  Visit Diagnosis: 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 Slow transit constipation   . Neuropathic pain   . Stroke, acute, thrombotic (HCC) 10/08/2015  . 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 Essential hypertension 10/08/2015  . HLD  (hyperlipidemia) 10/08/2015  . Hemiplegia and hemiparesis following cerebral infarction affecting right dominant side (HCC)   . Right hemiplegia (HCC) 10/06/2015  . Stroke (cerebrum) (HCC) 10/05/2015  . CVA (cerebral infarction) 10/05/2015    Norton Pastel, OTR/L 05/24/2016, 4:44 PM  [facility] [address] [place], [place], [address] Phone: [phone]   Fax:  [phone]  Name: Sean Wilkerson MRN: [id number] Date of Birth: Oct 19, 1945

## 2016-05-24 NOTE — Therapy (Signed)
[facility] [address] [place], [place], [address] Phone: [phone]   Fax:  [phone]  Physical Therapy Treatment  Patient Details  Name: Sean Wilkerson MRN: [id number] Date of Birth: Feb 24, 1946 Referring Provider: Dr. Antony Contras  Encounter Date: 05/24/2016      PT End of Session - 05/24/16 1707    Visit Number 23   Number of Visits 33   Date for PT Re-Evaluation 06/28/16   Authorization Type [employer] Medicare   Authorization Time Period 04/29/16-06/28/16   PT Start Time 1401   PT Stop Time 1445   PT Time Calculation (min) 44 min   Equipment Utilized During Treatment Gait belt   Activity Tolerance Patient tolerated treatment well   Behavior During Therapy [facility] for tasks assessed/performed      Past Medical History:  Diagnosis Date  . HLD (hyperlipidemia) 10/08/2015  . HTN (hypertension) 10/08/2015  . Hypertension   . Stroke [facility])     Past Surgical History:  Procedure Laterality Date  . NO PAST SURGERIES      There were no vitals filed for this visit.      Subjective Assessment - 05/24/16 1412    Subjective Pt arrived to session ambulating with RW with (S) of wife. Wife states, "He walked in from outside. He's doing really good."   Patient is accompained by: Family member   Pertinent History L CVA 10-05-15 - hospitalized until Jan - D/C'd home with [facility], [place] finished up on 02-25-16   Patient Stated Goals "I want to walk without anything"   Currently in Pain? No/denies                         [facility] Adult PT Treatment/Exercise - 05/24/16 0001      Bed Mobility   Bed Mobility Supine to Sit;Sit to Supine   Supine to Sit 6: Modified independent (Device/Increase time)   Supine to Sit Details (indicate cue type and reason) neutral height mat table; pt not wearing shoes or R AFO   Sit to Supine 6: Modified independent (Device/Increase time)   Sit to Supine -  Details (indicate cue type and reason) neutral height mat table; pt not wearing shoes or R AFO     Transfers   Transfers Sit to Stand;Stand to Sit   Sit to Stand 5: Supervision   Stand to Sit 5: Supervision   Stand Pivot Transfers 5: Supervision;4: Min guard   Stand Pivot Transfer Details (indicate cue type and reason) with RW x2 trials; without RW x8 trials with mirror anterior to pt; see below for details.   Comments Blocked practice of stand pivot transfers without AD in B directions using mirror for visual feedback; cueing focused on grading/control of lateral weight shift to L side when side stepping/pivoting to L side.     Ambulation/Gait   Ambulation/Gait Yes   Ambulation/Gait Assistance 5: Supervision;3: Mod assist   Ambulation/Gait Assistance Details Gait [phone] consecutively with RW, R h/o, and R AFO with (S) for majority of gait trial, mod A to recover from single LOB to L side when pt turning to L in crowded hallway. Gait 2 x12' without AD with min A, cueing for B hip extension, L stance stability, full lateral weight shift to L.   Ambulation Distance (Feet) 148 Feet  + 2 x10'   Assistive device Rolling walker  with R h/o;  R AFO  Gait Pattern Decreased hip/knee flexion - right;Decreased weight shift to right;Trunk rotated posteriorly on right;Step-through pattern;Decreased step length - right  R pelvic retraction during R stance   Ambulation Surface Level;Indoor     High Level Balance   High Level Balance Activities Side stepping   High Level Balance Comments Lateral stepping without AD 5 x4 steps in each direction with (S) to R side, min guard to L side due to pt difficulty grading weight shift to L.                PT Education - 05/24/16 1704    Education provided Yes   Education Details STG findings, progress.   Person(s) Educated Patient   Methods Explanation   Comprehension Verbalized understanding          PT Short Term Goals - 05/24/16 1413      PT  SHORT TERM GOAL #1   Title Pt will perform basic transfers with S only - wheelchair to/from mat.  (modified [place] Date: 05/27/16)   Baseline Min A with RW, AFO, and HO   Status On-going     PT SHORT TERM GOAL #2   Title Stand for at least 3" with intermittent UE support at counter without KAFO on RLE with SBA for incr. independence with ADL's.  (04-05-16)   Baseline 04/13/16: pt able to stand for >/= 3 minutes with intermittent support on walker with min guard assist to supervision   Status Achieved     PT SHORT TERM GOAL #3   Title Amb. 150' with LRAD with CGA on flat, even surface.  (Modified Target Date: 05/27/16)   Baseline Met 8/3.   Status Achieved     PT SHORT TERM GOAL #4   Title Perform bed mobility including sit to/from supine with S.  (Modified Target Date: 05/27/16)   Baseline 8/7: Mod I for bed mobility (without shoes or AFO)   Status Achieved     PT SHORT TERM GOAL #5   Title Independent in HEP for RLE strengthening.  (04-05-16)   Baseline reports compliance with bed level exercises 04/15/16   Time 4   Period Weeks   Status Achieved           PT Long Term Goals - 04/29/16 1848      PT LONG TERM GOAL #1   Title Modified independent with basic transfers.  Modified Target Date: 06/24/16)   Baseline Min/guard for stand pivot transfers with RW and HO 04/29/16   Time 8   Period Weeks   Status On-going     PT LONG TERM GOAL #2   Title Modified independent with bed mobility.  (Modified Target Date: 06/24/16)   Baseline Requires min A for safety of RLE 04/29/16   Time 8   Period Weeks   Status On-going     PT LONG TERM GOAL #3   Title Amb. 100' with LRAD with S for incr. household amb.  (Modified Target Date: 06/24/16)   Baseline Amb up to 80' w/ RW and R hand orthosis at min A level   Time 8   Period Weeks   Status On-going  and revised distance     PT LONG TERM GOAL #4   Title Amb. 240' with hemiwalker with CGA for incr. community accessibility.  (Modified Target  Date: 06/24/16)   Time 8   Period Weeks   Status On-going     PT LONG TERM GOAL #5   Title Negotiate steps (4) with   S with use of hand rail .  (Modified Target Date: 06/24/16)   Time 8   Period Weeks   Status New     PT LONG TERM GOAL #6   Title Perform Berg balance test when appropriate and establish goal as needed.  (05-04-16)   Baseline Will likely not be appropriate due to fall risk without device.    Time 8   Period Weeks   Status Deferred               Plan - 05/24/16 1708    Clinical Impression Statement Session focused on gait training with emphasis on grading of lateral weight shift to L side with turning and sidestepping to L side. Noted within-session improvement.Pt has met al 4 of 4 STG's assessed.   Rehab Potential Good   PT Frequency 2x / week   PT Duration 8 weeks   PT Treatment/Interventions ADLs/Self Care Home Management;DME Instruction;Balance training;Therapeutic exercise;Therapeutic activities;Functional mobility training;Stair training;Gait training;Neuromuscular re-education;Patient/family education;Orthotic Fit/Training;Passive range of motion   PT Next Visit Plan Check remaining STG for transfers. Short-distance gait without AD (if safe). During gait training, address grading of lateral weight shift to L side (especially with turning/lateral stepping to L),.   Consulted and Agree with Plan of Care Patient      Patient will benefit from skilled therapeutic intervention in order to improve the following deficits and impairments:  Abnormal gait, Decreased activity tolerance, Decreased balance, Decreased coordination, Decreased endurance, Decreased mobility, Decreased range of motion, Decreased strength, Impaired tone, Impaired UE functional use, Obesity  Visit Diagnosis: Hemiplegia and hemiparesis following cerebral infarction affecting right non-dominant side (HCC)  Other abnormalities of gait and mobility  Unsteadiness on feet     Problem  List Patient Active Problem List   Diagnosis Date Noted  . Slow transit constipation   . Neuropathic pain   . Stroke, acute, thrombotic ([facility]) 10/08/2015  . Hemiparesis, aphasia, and dysphagia as late effect of cerebrovascular accident (CVA) ([place]) 10/08/2015  . Essential hypertension 10/08/2015  . HLD (hyperlipidemia) 10/08/2015  . Hemiplegia and hemiparesis following cerebral infarction affecting right dominant side ([place])   . Right hemiplegia ([place]) 10/06/2015  . Stroke (cerebrum) ([place]) 10/05/2015  . CVA (cerebral infarction) 10/05/2015    Billie Ruddy, PT, DPT [facility] [address] [place], [place], [address] Phone: [phone]   Fax:  [phone] 05/24/16, 5:13 PM  Name: Sean Wilkerson MRN: [id number] Date of Birth: 1946-07-15

## 2016-05-27 ENCOUNTER — Ambulatory Visit: Payer: Medicare Other | Admitting: Speech Pathology

## 2016-05-27 ENCOUNTER — Ambulatory Visit: Payer: Medicare Other | Admitting: Occupational Therapy

## 2016-05-27 ENCOUNTER — Ambulatory Visit: Payer: Medicare Other | Admitting: Rehabilitation

## 2016-05-27 ENCOUNTER — Encounter: Payer: Self-pay | Admitting: Rehabilitation

## 2016-05-27 ENCOUNTER — Encounter: Payer: Self-pay | Admitting: Occupational Therapy

## 2016-05-27 DIAGNOSIS — R471 Dysarthria and anarthria: Secondary | ICD-10-CM

## 2016-05-27 DIAGNOSIS — M6281 Muscle weakness (generalized): Secondary | ICD-10-CM

## 2016-05-27 DIAGNOSIS — I69353 Hemiplegia and hemiparesis following cerebral infarction affecting right non-dominant side: Secondary | ICD-10-CM

## 2016-05-27 DIAGNOSIS — R2689 Other abnormalities of gait and mobility: Secondary | ICD-10-CM

## 2016-05-27 DIAGNOSIS — R2681 Unsteadiness on feet: Secondary | ICD-10-CM

## 2016-05-27 DIAGNOSIS — R278 Other lack of coordination: Secondary | ICD-10-CM

## 2016-05-27 NOTE — Therapy (Signed)
[facility] [address] [place], [place], [address] Phone: [phone]   Fax:  [phone]  Occupational Therapy Treatment  Patient Details  Name: Sean BirchwoodLarry Lee Weed MRN: [id number] Date of Birth: January 11, 1946 Referring Provider: Dr. Pearlean BrownieSethi  Encounter Date: 05/27/2016      OT End of Session - 05/27/16 1637    Visit Number 22   Number of Visits 32   Date for OT Re-Evaluation 06/24/16   Authorization Type UMR MCR   Authorization - Visit Number 22   Authorization - Number of Visits 30   OT Start Time 1403   OT Stop Time 1445   OT Time Calculation (min) 42 min   Activity Tolerance Patient tolerated treatment well   Behavior During Therapy [facility] for tasks assessed/performed      Past Medical History:  Diagnosis Date  . HLD (hyperlipidemia) 10/08/2015  . HTN (hypertension) 10/08/2015  . Hypertension   . Stroke [facility](HCC)     Past Surgical History:  Procedure Laterality Date  . NO PAST SURGERIES      There were no vitals filed for this visit.      Subjective Assessment - 05/27/16 1619    Subjective  I am doing a little better   Pertinent History see Epic, L CVA   Patient Stated Goals to be more indpendent   Currently in Pain? No/denies   Pain Score 0-No pain                      OT Treatments/Exercises (OP) - 05/27/16 0001      Neurological Re-education Exercises   Other Exercises 1 Neuro reeducation to address postural control and active control in right upper extremity.  Patient gaining more proximal movement after weight bearing, able to facilitate shoulder flexion, extension, abd/adduction with excessive accesory motion.  Patient needing mod facilitationa nd cueing to isolate shoulder and elbow motion for pre reach patterns in low and mid ranges.  Patient able to control single plane directional change from flexion to extension on mobile surface (chair back)  without guiding through 1/4 range.      Other Exercises 2 Standing in modified plantigrade with emphasis on right upper extremity alignment and activation in increaing ranges of shoulder flexion (body on arm) in closed chain condition.  Patient with consistent report of pain in his waist with prolonged standing activity.  Patient has difficulty sustaining standing, and requests to sit after ~2 minutes.                  OT Education - 05/27/16 1636    Education provided Yes   Education Details Muscle balance and alignment in standing   Person(s) Educated Patient   Methods Explanation;Verbal cues;Tactile cues   Comprehension Need further instruction          OT Short Term Goals - 05/24/16 1642      OT SHORT TERM GOAL #1   Title I with inital HEP.- check 04/03/16   Status Achieved     OT SHORT TERM GOAL #2   Title Pt will demonstrate understanding of RUE positioning to minimize pain and risk for injury including splint wear PRN.   Status Achieved     OT SHORT TERM GOAL #3   Title Pt will consistently donn shirt with min A.   Status Achieved     OT SHORT TERM GOAL #4   Title Pt will perform toilet transfers with min A.   Status Achieved  OT SHORT TERM GOAL #5   Title Pt will donn pants with min A using AE PRN.   Status Achieved     OT SHORT TERM GOAL #6   Title Pt will  perform shower transfer and bathe with min A.- 05/27/2016   Status Achieved     OT SHORT TERM GOAL #7   Title Pt will tolerate P/ROM shoulder flexion to 90* with no significant increase in pain.   Status Achieved           OT Long Term Goals - 05/24/16 1642      OT LONG TERM GOAL #1   Title Pt will donn shirt with set up only.- check 06/24/2016   Status On-going     OT LONG TERM GOAL #2   Title Pt will donn pants with minguard for balance.   Status On-going     OT LONG TERM GOAL #3   Title Pt will donn socks and shoes with min A.   Status On-going     OT LONG TERM GOAL #4   Title Pt will use RUE as a stabilizer for ADLS  at least 25 % of the time with pain less than or equal to 3/10.   Status On-going     OT LONG TERM GOAL #5   Title Pt will demonstrate P/ROM shoulder flexion to 100* with pain no greater than 3/10.   Status Achieved     OT LONG TERM GOAL #6   Title Pt will demonstrate ability to stand at countertop and retrieve an item from midlevel shelf with LUE, with supervision and no LOB.   Status Achieved               Plan - 05/27/16 1638    Clinical Impression Statement Patient with slow progression toward remaining OT goals, although functional mobility has improved significantly.     Rehab Potential Good   Clinical Impairments Affecting Rehab Potential Potential for depression- will monitor and offer support, resources as warranted; severity of deficits   OT Frequency 2x / week   OT Duration 8 weeks   OT Treatment/Interventions Self-care/ADL training;Therapeutic exercise;Patient/family education;Balance training;Splinting;Neuromuscular education;Ultrasound;Energy conservation;Therapeutic exercises;Therapeutic activities;DME and/or AE instruction;Parrafin;Cryotherapy;Electrical Stimulation;Fluidtherapy;Cognitive remediation/compensation;Visual/perceptual remediation/compensation;Passive range of motion;Contrast Bath;Moist Heat   Plan Forced use RUE - open door, drawers, low cabinets.  Increase sustained stand tolerance / balance   Consulted and Agree with Plan of Care Patient      Patient will benefit from skilled therapeutic intervention in order to improve the following deficits and impairments:  Decreased coordination, Decreased range of motion, Increased edema, Decreased safety awareness, Decreased endurance, Decreased activity tolerance, Decreased knowledge of precautions, Impaired tone, Obesity, Pain, Impaired UE functional use, Decreased knowledge of use of DME, Decreased balance, Decreased cognition, Decreased mobility, Decreased strength, Impaired perceived functional ability,  Impaired vision/preception  Visit Diagnosis: 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 Slow transit constipation   . Neuropathic pain   . Stroke, acute, thrombotic (HCC) 10/08/2015  . 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 Essential hypertension 10/08/2015  . HLD (hyperlipidemia) 10/08/2015  . Hemiplegia and hemiparesis following cerebral infarction affecting right dominant side (HCC)   . Right hemiplegia (HCC) 10/06/2015  . Stroke (cerebrum) (HCC) 10/05/2015  . CVA (cerebral infarction)  10/05/2015    Collier Salina, OTR/L 05/27/2016, 4:53 PM  [place] [facility] [address] [place], [place], [address] Phone: [phone]   Fax:  [phone]  Name: Leondre Taul MRN: [id number] Date of Birth: 02/07/1946

## 2016-05-27 NOTE — Therapy (Signed)
[facility] [address] [place], [place], [address] Phone: [phone]   Fax:  [phone]  Physical Therapy Treatment  Patient Details  Name: Sean Wilkerson MRN: [id number] Date of Birth: [birth year] Referring Provider: Dr. Antony Contras  Encounter Date: 05/27/2016      PT End of Session - 05/27/16 1828    Visit Number 24   Number of Visits 33   Date for PT Re-Evaluation 06/28/16   Authorization Type UHC Medicare   Authorization Time Period 04/29/16-06/28/16   PT Start Time 1450   PT Stop Time 1535   PT Time Calculation (min) 45 min   Activity Tolerance Patient tolerated treatment well   Behavior During Therapy [facility] for tasks assessed/performed      Past Medical History:  Diagnosis Date  . HLD (hyperlipidemia) 10/08/2015  . HTN (hypertension) 10/08/2015  . Hypertension   . Stroke [facility])     Past Surgical History:  Procedure Laterality Date  . NO PAST SURGERIES      There were no vitals filed for this visit.      Subjective Assessment - 05/27/16 1456    Subjective Pt arrived to session, continuing to ambulate into clinic.     Pertinent History L CVA 10-05-15 - hospitalized until Jan - D/C'd home with [place], [place] finished up on 02-25-16   Patient Stated Goals "I want to walk without anything"   Currently in Pain? No/denies                         [facility] Adult PT Treatment/Exercise - 05/27/16 1500      Transfers   Transfers Sit to Stand;Stand to Sit   Sit to Stand 5: Supervision   Sit to Stand Details Verbal cues for safe use of DME/AE;Verbal cues for precautions/safety   Stand to Sit 5: Supervision   Stand to Sit Details (indicate cue type and reason) Verbal cues for precautions/safety;Verbal cues for safe use of DME/AE   Stand Pivot Transfers 5: Supervision   Stand Pivot Transfer Details (indicate cue type and reason) Assessed stand pivot transfers x 2 reps  to L and R with RW and R HO to assess STG.  Performed at S level with cues for stepping and RW placement.  Also performed x 2 reps without AD in order to better address R lateral weight shift and improved posture.  Pt performed task with improved quality without AD during session.       Ambulation/Gait   Ambulation/Gait Yes   Ambulation/Gait Assistance 4: Min assist   Ambulation/Gait Assistance Details Gait with RW R HO/AFO, then with SBQC and R AFO   Ambulation Distance (Feet) 75 Feet  x 2, then another 34'   Assistive device Rolling walker;Small based quad cane  with R AFO   Gait Pattern Decreased hip/knee flexion - right;Decreased weight shift to right;Trunk rotated posteriorly on right;Step-through pattern;Decreased step length - right   Ambulation Surface Level;Indoor   Gait Comments Performed first bout of gait with RW, RHO and RAFO for improved quality.  Pt able to ambulate at S to min/guard level with cues for upright posture and increased R hip protraction when loading RLE during stance.  Also cues for safe turning with RW.  Transitioned to gait training with [facility] and RAFO to progress to LRAD and challenge pt to improve RLE WB/weight shift as well as improved postural control.  Tolerated very well  at min A level for tactile facilitation at chest for posture and at pelvis for improved R hip protraction during stance phase of gait.  Pt progressing well with gait.                  PT Education - 05/27/16 1827    Education provided Yes   Education Details working towards gait with [employer]) Educated Patient   Methods Explanation;Demonstration   Comprehension Verbalized understanding;Returned demonstration          PT Short Term Goals - 05/27/16 1457      PT SHORT TERM GOAL #1   Title Pt will perform basic transfers with S only - wheelchair to/from mat.  (modified Target Date: 05/27/16)   Baseline met 05/27/16   Status Achieved     PT SHORT TERM GOAL #2   Title  Stand for at least 3" with intermittent UE support at counter without KAFO on RLE with SBA for incr. independence with ADL's.  (04-05-16)   Baseline 04/13/16: pt able to stand for >/= 3 minutes with intermittent support on walker with min guard assist to supervision   Status Achieved     PT SHORT TERM GOAL #3   Title Amb. 150' with LRAD with CGA on flat, even surface.  (Modified Target Date: 05/27/16)   Baseline Met 8/3.   Status Achieved     PT SHORT TERM GOAL #4   Title Perform bed mobility including sit to/from supine with S.  (Modified Target Date: 05/27/16)   Baseline 8/7: Mod I for bed mobility (without shoes or AFO)   Status Achieved     PT SHORT TERM GOAL #5   Title Independent in HEP for RLE strengthening.  (04-05-16)   Baseline reports compliance with bed level exercises 04/15/16   Time 4   Period Weeks   Status Achieved           PT Long Term Goals - 04/29/16 1848      PT LONG TERM GOAL #1   Title Modified independent with basic transfers.  Modified Target Date: 06/24/16)   Baseline Min/guard for stand pivot transfers with RW and HO 04/29/16   Time 8   Period Weeks   Status On-going     PT LONG TERM GOAL #2   Title Modified independent with bed mobility.  (Modified Target Date: 06/24/16)   Baseline Requires min A for safety of RLE 04/29/16   Time 8   Period Weeks   Status On-going     PT LONG TERM GOAL #3   Title Amb. 100' with LRAD with S for incr. household amb.  (Modified Target Date: 06/24/16)   Baseline Amb up to 48' w/ RW and R hand orthosis at min A level   Time 8   Period Weeks   Status On-going  and revised distance     PT LONG TERM GOAL #4   Title Amb. 31' with hemiwalker with CGA for incr. community accessibility.  (Modified Target Date: 06/24/16)   Time 8   Period Weeks   Status On-going     PT LONG TERM GOAL #5   Title Negotiate steps (4) with S with use of hand rail .  (Modified Target Date: 06/24/16)   Time 8   Period Weeks   Status New     PT  LONG TERM GOAL #6   Title Perform Berg balance test when appropriate and establish goal as needed.  (05-04-16)   Baseline  Will likely not be appropriate due to fall risk without device.    Time 8   Period Weeks   Status Deferred               Plan - 05/27/16 1828    Clinical Impression Statement Skilled session focused on functional stand pivot transfer with and without RW to address STG.  Pt able to meet goal with use of RW and R HO and AFO.  Note that he actually has improved posture and weight shift without use of RW with cues for sequencing and weight shift.  Also initiated gait with use of SBQC during session.  Following discussion with OT during session, feel that he is able to progress to this LRAD at this time with focus on posture and upright trunk to improve activation in shoulder girdle.     Rehab Potential Good   PT Frequency 2x / week   PT Duration 8 weeks   PT Treatment/Interventions ADLs/Self Care Home Management;DME Instruction;Balance training;Therapeutic exercise;Therapeutic activities;Functional mobility training;Stair training;Gait training;Neuromuscular re-education;Patient/family education;Orthotic Fit/Training;Passive range of motion   PT Next Visit Plan During gait training, address grading of lateral weight shift to L side (especially with turning/lateral stepping to L),.  Gait training with SBQC and without device if safe for L NMR postural control.     Consulted and Agree with Plan of Care Patient      Patient will benefit from skilled therapeutic intervention in order to improve the following deficits and impairments:  Abnormal gait, Decreased activity tolerance, Decreased balance, Decreased coordination, Decreased endurance, Decreased mobility, Decreased range of motion, Decreased strength, Impaired tone, Impaired UE functional use, Obesity  Visit Diagnosis: No diagnosis found.     Problem List Patient Active Problem List   Diagnosis Date Noted  .  Slow transit constipation   . Neuropathic pain   . Stroke, acute, thrombotic ([place]) 10/08/2015  . Hemiparesis, aphasia, and dysphagia as late effect of cerebrovascular accident (CVA) ([place]) 10/08/2015  . Essential hypertension 10/08/2015  . HLD (hyperlipidemia) 10/08/2015  . Hemiplegia and hemiparesis following cerebral infarction affecting right dominant side ([place])   . Right hemiplegia ([place]) 10/06/2015  . Stroke (cerebrum) ([place]) 10/05/2015  . CVA (cerebral infarction) 10/05/2015    Cameron Sprang, PT, MPT [facility] [address] [place], [place], [address] Phone: [phone]   Fax:  [phone] 05/27/16, 6:52 PM  Name: Sean Wilkerson MRN: [id number] Date of Birth: [birth year]

## 2016-05-27 NOTE — Patient Instructions (Addendum)
  Get the other person's attention before you start talking  Look face to face with there person you are talking to  Be in close proximity to the person you are talking to  Don't talk in noisy environments - mute the TV, move away from running appliances, go to a quiet environment  Get a good breath before speaking - try not to push from your throat - use breath to generate volume.   Use gentle voice - don't push or strain from your throat

## 2016-05-27 NOTE — Therapy (Signed)
[facility] [address] [place], [place], [address] Phone: [phone]   Fax:  [phone]  Speech Language Pathology Treatment  Patient Details  Name: Sean Wilkerson MRN: [id number] Date of Birth: Sep 29, 1946 Referring Provider: Antony Contras  Encounter Date: 05/27/2016      End of Session - 05/27/16 1559    Visit Number 16   Number of Visits 17   Date for SLP Re-Evaluation 06/16/16   SLP Start Time 8828   SLP Stop Time  1400   SLP Time Calculation (min) 43 min   Activity Tolerance Patient tolerated treatment well      Past Medical History:  Diagnosis Date  . HLD (hyperlipidemia) 10/08/2015  . HTN (hypertension) 10/08/2015  . Hypertension   . Stroke [facility])     Past Surgical History:  Procedure Laterality Date  . NO PAST SURGERIES      There were no vitals filed for this visit.      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 Laryngeal palpation, head turns, nasal syllables  and pitch glides did not reduce hoarseness. Cued pt for easy onset of phonation with mild improvement on vocal quality. Simlpe conversation with slow rate and more frequent breath support with 100% intelligilbity. Trained pt in environmental strategies to improve communicatoin.      Assessment / Recommendations / Plan   Plan Continue with current plan of care;Consult other service (comment)  ENT consult     Progression Toward Goals   Progression toward goals Progressing toward goals  dysphonia continues to impact intellgibility -           SLP Education - 05/27/16 1403    Education provided Yes   Education Details environmental compensations to improve intellgibility   Person(s) Educated Patient   Methods Explanation;Handout;Demonstration   Comprehension Verbalized understanding          SLP Short Term Goals - 05/27/16 1559      SLP SHORT TERM GOAL #1   Title pt will demo 23/25 reps of EMST with proper procedure over 4 sessions   Status Not Met     SLP SHORT TERM GOAL #2   Title pt will demo oral-motor HEP with rare min A    Status Partially Met     SLP SHORT TERM GOAL #3   Title pt will demo 70dB average volume in 5 minutes simple conversation over three sessions   Status Not Met          SLP Long Term Goals - 05/27/16 1559      SLP LONG TERM GOAL #1   Title pt will demo 22/25 reps of EMST with appropriate procedure over 4 sessions   Baseline one session 04-22-16   Time 2   Period Weeks   Status Not Met     SLP LONG TERM GOAL #2   Title pt will demo oral motor HEP with rare min  A over three sessions   Time 2   Period Weeks   Status Partially Met     SLP LONG TERM GOAL #3   Title pt will demo reduced rate of speech in 5 minutes simple conversation adequate for 95% intelligibility over 3 sessions   Time 2   Period Weeks   Status Not Met          Plan - 05/27/16 1557    Clinical Impression Statement 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 In terms of dysarthria, pt using compensations in structured tasks and simple conversation with occasional min A. Continue skilled ST to maximize intellgiblity. Pt will likely benefit from ENt follow up as voice has not significantly improved.   Speech Therapy Frequency 2x / week   Treatment/Interventions Oral motor exercises;Compensatory strategies;SLP instruction and feedback;Patient/family  education;Functional tasks;Cueing hierarchy   Potential to Achieve Goals Good   Potential Considerations Severity of impairments      Patient will benefit from skilled therapeutic intervention in order to improve the following deficits and impairments:   Dysarthria and anarthria    Problem List Patient Active Problem List   Diagnosis Date Noted  . Slow transit constipation   . Neuropathic pain   . Stroke, acute, thrombotic ([place]) 10/08/2015  . Hemiparesis, aphasia, and dysphagia as late effect of cerebrovascular accident (CVA) ([place]) 10/08/2015  . Essential hypertension 10/08/2015  . HLD (hyperlipidemia) 10/08/2015  . Hemiplegia and hemiparesis following cerebral infarction affecting right dominant side ([place])   . Right hemiplegia ([place]) 10/06/2015  . Stroke (cerebrum) ([place]) 10/05/2015  . CVA (cerebral infarction) 10/05/2015    Sean Wilkerson, Sean Rusk MS, CCC-SLP 05/27/2016, 4:00 PM  [facility] [address] [place], [place], [address] Phone: [phone]   Fax:  [phone]   Name: Sean Wilkerson MRN: [id number] Date of Birth: 17-Nov-1945

## 2016-05-27 NOTE — Therapy (Signed)
[facility] [address] [place], [place], [address] Phone: [phone]   Fax:  [phone]  Occupational Therapy Treatment  Patient Details  Name: Sean Wilkerson MRN: [id number] Date of Birth: January 11, 1946 Referring Provider: Dr. Pearlean BrownieSethi  Encounter Date: 05/27/2016      OT End of Session - 05/27/16 1637    Visit Number 22   Number of Visits 32   Date for OT Re-Evaluation 06/24/16   Authorization Type UMR MCR   Authorization - Visit Number 22   Authorization - Number of Visits 30   OT Start Time 1403   OT Stop Time 1445   OT Time Calculation (min) 42 min   Activity Tolerance Patient tolerated treatment well   Behavior During Therapy [facility] for tasks assessed/performed      Past Medical History:  Diagnosis Date  . HLD (hyperlipidemia) 10/08/2015  . HTN (hypertension) 10/08/2015  . Hypertension   . Stroke [facility](HCC)     Past Surgical History:  Procedure Laterality Date  . NO PAST SURGERIES      There were no vitals filed for this visit.      Subjective Assessment - 05/27/16 1619    Subjective  I am doing a little better   Pertinent History see Epic, L CVA   Patient Stated Goals to be more indpendent   Currently in Pain? No/denies   Pain Score 0-No pain                      OT Treatments/Exercises (OP) - 05/27/16 0001      Neurological Re-education Exercises   Other Exercises 1 Neuro reeducation to address postural control and active control in right upper extremity.  Patient gaining more proximal movement after weight bearing, able to facilitate shoulder flexion, extension, abd/adduction with excessive accesory motion.  Patient needing mod facilitationa nd cueing to isolate shoulder and elbow motion for pre reach patterns in low and mid ranges.  Patient able to control single plane directional change from flexion to extension on mobile surface (chair back)  without guiding through 1/4 range.      Other Exercises 2 Standing in modified plantigrade with emphasis on right upper extremity alignment and activation in increaing ranges of shoulder flexion (body on arm) in closed chain condition.  Patient with consistent report of pain in his waist with prolonged standing activity.  Patient has difficulty sustaining standing, and requests to sit after ~2 minutes.                  OT Education - 05/27/16 1636    Education provided Yes   Education Details Muscle balance and alignment in standing   Person(s) Educated Patient   Methods Explanation;Verbal cues;Tactile cues   Comprehension Need further instruction          OT Short Term Goals - 05/24/16 1642      OT SHORT TERM GOAL #1   Title I with inital HEP.- check 04/03/16   Status Achieved     OT SHORT TERM GOAL #2   Title Pt will demonstrate understanding of RUE positioning to minimize pain and risk for injury including splint wear PRN.   Status Achieved     OT SHORT TERM GOAL #3   Title Pt will consistently donn shirt with min A.   Status Achieved     OT SHORT TERM GOAL #4   Title Pt will perform toilet transfers with min A.   Status Achieved  OT SHORT TERM GOAL #5   Title Pt will donn pants with min A using AE PRN.   Status Achieved     OT SHORT TERM GOAL #6   Title Pt will  perform shower transfer and bathe with min A.- 05/27/2016   Status Achieved     OT SHORT TERM GOAL #7   Title Pt will tolerate P/ROM shoulder flexion to 90* with no significant increase in pain.   Status Achieved           OT Long Term Goals - 05/24/16 1642      OT LONG TERM GOAL #1   Title Pt will donn shirt with set up only.- check 06/24/2016   Status On-going     OT LONG TERM GOAL #2   Title Pt will donn pants with minguard for balance.   Status On-going     OT LONG TERM GOAL #3   Title Pt will donn socks and shoes with min A.   Status On-going     OT LONG TERM GOAL #4   Title Pt will use RUE as a stabilizer for ADLS  at least 25 % of the time with pain less than or equal to 3/10.   Status On-going     OT LONG TERM GOAL #5   Title Pt will demonstrate P/ROM shoulder flexion to 100* with pain no greater than 3/10.   Status Achieved     OT LONG TERM GOAL #6   Title Pt will demonstrate ability to stand at countertop and retrieve an item from midlevel shelf with LUE, with supervision and no LOB.   Status Achieved               Plan - 05/27/16 1638    Clinical Impression Statement Patient with slow progression toward remaining OT goals, although functional mobility has improved significantly.     Rehab Potential Good   Clinical Impairments Affecting Rehab Potential Potential for depression- will monitor and offer support, resources as warranted; severity of deficits   OT Frequency 2x / week   OT Duration 8 weeks   OT Treatment/Interventions Self-care/ADL training;Therapeutic exercise;Patient/family education;Balance training;Splinting;Neuromuscular education;Ultrasound;Energy conservation;Therapeutic exercises;Therapeutic activities;DME and/or AE instruction;Parrafin;Cryotherapy;Electrical Stimulation;Fluidtherapy;Cognitive remediation/compensation;Visual/perceptual remediation/compensation;Passive range of motion;Contrast Bath;Moist Heat   Plan Forced use RUE - open door, drawers, low cabinets.  Increase sustained stand tolerance / balance   Consulted and Agree with Plan of Care Patient      Patient will benefit from skilled therapeutic intervention in order to improve the following deficits and impairments:  Decreased coordination, Decreased range of motion, Increased edema, Decreased safety awareness, Decreased endurance, Decreased activity tolerance, Decreased knowledge of precautions, Impaired tone, Obesity, Pain, Impaired UE functional use, Decreased knowledge of use of DME, Decreased balance, Decreased cognition, Decreased mobility, Decreased strength, Impaired perceived functional ability,  Impaired vision/preception  Visit Diagnosis: 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 Slow transit constipation   . Neuropathic pain   . Stroke, acute, thrombotic (HCC) 10/08/2015  . 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 Essential hypertension 10/08/2015  . HLD (hyperlipidemia) 10/08/2015  . Hemiplegia and hemiparesis following cerebral infarction affecting right dominant side (HCC)   . Right hemiplegia (HCC) 10/06/2015  . Stroke (cerebrum) (HCC) 10/05/2015  . CVA (cerebral infarction)  10/05/2015    Collier Salina, OTR/L 05/27/2016, 4:41 PM  [facility] [address] [place], [place], [address] Phone: [phone]   Fax:  [phone]  Name: Sean Wilkerson MRN: [id number] Date of Birth: 08/20/1946

## 2016-05-31 ENCOUNTER — Ambulatory Visit: Payer: Medicare Other | Admitting: Rehabilitation

## 2016-05-31 ENCOUNTER — Ambulatory Visit: Payer: Medicare Other | Admitting: Occupational Therapy

## 2016-05-31 DIAGNOSIS — M6281 Muscle weakness (generalized): Secondary | ICD-10-CM

## 2016-05-31 DIAGNOSIS — R278 Other lack of coordination: Secondary | ICD-10-CM

## 2016-05-31 DIAGNOSIS — R2689 Other abnormalities of gait and mobility: Secondary | ICD-10-CM

## 2016-05-31 DIAGNOSIS — I69353 Hemiplegia and hemiparesis following cerebral infarction affecting right non-dominant side: Secondary | ICD-10-CM

## 2016-05-31 DIAGNOSIS — R2681 Unsteadiness on feet: Secondary | ICD-10-CM

## 2016-05-31 NOTE — Therapy (Signed)
[facility] [address] [place], [place], [address] Phone: [phone]   Fax:  [phone]  Occupational Therapy Treatment  Patient Details  Name: Sean Wilkerson Abdella MRN: [id number] Date of Birth: [birth year] Referring Provider: Dr. Pearlean BrownieSethi  Encounter Date: 05/31/2016      OT End of Session - 05/31/16 1446    Visit Number 23   Number of Visits 32   Date for OT Re-Evaluation 06/24/16   Authorization Type UMR MCR   Authorization - Visit Number 23   Authorization - Number of Visits 30   OT Start Time 1401   OT Stop Time 1442   OT Time Calculation (min) 41 min   Activity Tolerance Patient tolerated treatment well      Past Medical History:  Diagnosis Date  . HLD (hyperlipidemia) 10/08/2015  . HTN (hypertension) 10/08/2015  . Hypertension   . Stroke [facility](HCC)     Past Surgical History:  Procedure Laterality Date  . NO PAST SURGERIES      There were no vitals filed for this visit.      Subjective Assessment - 05/31/16 1405    Subjective  My energy level is not good today   Pertinent History see Epic, L CVA   Patient Stated Goals to be more indpendent   Currently in Pain? No/denies                      OT Treatments/Exercises (OP) - 05/31/16 0001      ADLs   ADL Comments Addressed UB and LB dressing and undressing. Pt required vc's only for UB dressing and supervision for balance and min a for R sock and shoe and to tie shoes with min vc's for LB dressing. It is clear pt has been practicing at home. Pt able to complete in reasonable period of time.  Also addressed functional ambulation with emphasis on weight shifting to the right, increased activation of trunk and RUE with use of walker, safety and activity tolerance.                   OT Short Term Goals - 05/31/16 1444      OT SHORT TERM GOAL #1   Title I with inital HEP.- check 04/03/16   Status Achieved     OT SHORT TERM  GOAL #2   Title Pt will demonstrate understanding of RUE positioning to minimize pain and risk for injury including splint wear PRN.   Status Achieved     OT SHORT TERM GOAL #3   Title Pt will consistently donn shirt with min A.   Status Achieved     OT SHORT TERM GOAL #4   Title Pt will perform toilet transfers with min A.   Status Achieved     OT SHORT TERM GOAL #5   Title Pt will donn pants with min A using AE PRN.   Status Achieved     OT SHORT TERM GOAL #6   Title Pt will  perform shower transfer and bathe with min A.- 05/27/2016   Status Achieved     OT SHORT TERM GOAL #7   Title Pt will tolerate P/ROM shoulder flexion to 90* with no significant increase in pain.   Status Achieved           OT Long Term Goals - 05/31/16 1445      OT LONG TERM GOAL #1   Title Pt will donn shirt with set up  only.- check 06/24/2016   Status Achieved     OT LONG TERM GOAL #2   Title Pt will donn pants with minguard for balance.   Status Achieved     OT LONG TERM GOAL #3   Title Pt will donn socks and shoes with min A.   Status On-going     OT LONG TERM GOAL #4   Title Pt will use RUE as a stabilizer for ADLS at least 25 % of the time with pain less than or equal to 3/10.   Status On-going     OT LONG TERM GOAL #5   Title Pt will demonstrate P/ROM shoulder flexion to 100* with pain no greater than 3/10.   Status Achieved     OT LONG TERM GOAL #6   Title Pt will demonstrate ability to stand at countertop and retrieve an item from midlevel shelf with LUE, with supervision and no LOB.   Status Achieved               Plan - 05/31/16 1445    Clinical Impression Statement Pt making good progress toward goals.  Pt has been working on dressing at home   Rehab Potential Good   Clinical Impairments Affecting Rehab Potential Potential for depression- will monitor and offer support, resources as warranted; severity of deficits   OT Frequency 2x / week   OT Duration 8 weeks   OT  Treatment/Interventions Self-care/ADL training;Therapeutic exercise;Patient/family education;Balance training;Splinting;Neuromuscular education;Ultrasound;Energy conservation;Therapeutic exercises;Therapeutic activities;DME and/or AE instruction;Parrafin;Cryotherapy;Electrical Stimulation;Fluidtherapy;Cognitive remediation/compensation;Visual/perceptual remediation/compensation;Passive range of motion;Contrast Bath;Moist Heat   Plan forced RUE use, balance, activity tolerance.   Consulted and Agree with Plan of Care Patient      Patient will benefit from skilled therapeutic intervention in order to improve the following deficits and impairments:  Decreased coordination, Decreased range of motion, Increased edema, Decreased safety awareness, Decreased endurance, Decreased activity tolerance, Decreased knowledge of precautions, Impaired tone, Obesity, Pain, Impaired UE functional use, Decreased knowledge of use of DME, Decreased balance, Decreased cognition, Decreased mobility, Decreased strength, Impaired perceived functional ability, Impaired vision/preception  Visit Diagnosis: 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 Slow transit constipation   . Neuropathic pain   . Stroke, acute, thrombotic (HCC) 10/08/2015  . 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 Essential hypertension 10/08/2015  . HLD (hyperlipidemia) 10/08/2015  . Hemiplegia and hemiparesis following cerebral infarction affecting right dominant side (HCC)   . Right hemiplegia (HCC) 10/06/2015  . Stroke (cerebrum) (HCC) 10/05/2015  . CVA (cerebral infarction) 10/05/2015    Norton PastelPulask[place], Macedonio Scallon Halliday, OTR/L 05/31/2016, 2:47 PM  [facility] [address] [place], [place], [address] Phone: [phone]   Fax:  [phone]  Name: Sean Wilkerson Mill[place]ons MRN: [id number] Date of Birth: [birth year]

## 2016-06-01 ENCOUNTER — Encounter: Payer: Self-pay | Admitting: Rehabilitation

## 2016-06-01 NOTE — Therapy (Addendum)
[place] [address] [place], [place], [address] Phone: [phone]   Fax:  [phone]  Physical Therapy Treatment  Patient Details  Name: Sean Wilkerson MRN: [id number] Date of Birth: [birth year] Referring Provider: Dr. Antony Contras  Encounter Date: 05/31/2016      PT End of Session - 06/01/16 0944    Visit Number 25   Number of Visits 33   Date for PT Re-Evaluation 06/28/16   Authorization Type UHC Medicare   Authorization Time Period 04/29/16-06/28/16   PT Start Time 1318   PT Stop Time 1400   PT Time Calculation (min) 42 min   Activity Tolerance Patient tolerated treatment well   Behavior During Therapy [facility] for tasks assessed/performed      Past Medical History:  Diagnosis Date  . HLD (hyperlipidemia) 10/08/2015  . HTN (hypertension) 10/08/2015  . Hypertension   . Stroke [facility])     Past Surgical History:  Procedure Laterality Date  . NO PAST SURGERIES      There were no vitals filed for this visit.      Subjective Assessment - 06/01/16 0942    Subjective Pt continues to ambulate into clinic with RW and R HO, AFO.  Wife reports that she would like to keep working on stairs as he is "pushing back" when she is trying to help him.     Patient is accompained by: Family member   Pertinent History L CVA 10-05-15 - hospitalized until Jan - D/C'd home with [place], [place] finished up on 02-25-16   Patient Stated Goals "I want to walk without anything"   Currently in Pain? No/denies           Gait:  Performed stair negotiation x 2 sets of 2 reps during session, then 1 set of 1 rep.  Performed with PT during first set to better assess pts deficits when performing stairs.  Note that pt tends to "throw" trunk posteriorly when trying to move RLE down to next step when descending stairs, therefore provided cues to keep trunk upright and advance RLE to next step by utilizing R  hip/knee flex and R hip abduction.  Pt able to improve during session on this, however note that when pt descending stairs, he tends to not fully WB on RLE, therefore trunk being thrown posteriorly.  Provided cues on how to improve with upright trunk and increased RLE WB.  Note that pt able to return demonstration for a few steps, but will need continued work to address before getting rid of ramp at home.  Pt and wife verbalized understanding.  Ended session with gait with SBQC x 115' at min/guard progressing to min A with facilitation at trunk for upright posture and at pelvis for increased protraction and hip extension during gait.  Note deficits increased greatly with fatigue and requires more assist to maintain balance.                        PT Education - 06/01/16 [phone]    Education provided Yes   Education Details Education on safety and technique when negotiating stairs   Person(s) Educated Patient;Spouse   Methods Explanation;Demonstration   Comprehension Verbalized understanding;Returned demonstration;Need further instruction          PT Short Term Goals - 05/27/16 1457      PT SHORT TERM GOAL #1   Title Pt will perform basic transfers with S only -  wheelchair to/from mat.  (modified [place] Date: 05/27/16)   Baseline met 05/27/16   Status Achieved     PT SHORT TERM GOAL #2   Title Stand for at least 3" with intermittent UE support at counter without KAFO on RLE with SBA for incr. independence with ADL's.  (04-05-16)   Baseline 04/13/16: pt able to stand for >/= 3 minutes with intermittent support on walker with min guard assist to supervision   Status Achieved     PT SHORT TERM GOAL #3   Title Amb. 150' with LRAD with CGA on flat, even surface.  (Modified Target Date: 05/27/16)   Baseline Met 8/3.   Status Achieved     PT SHORT TERM GOAL #4   Title Perform bed mobility including sit to/from supine with S.  (Modified Target Date: 05/27/16)   Baseline 8/7: Mod I for  bed mobility (without shoes or AFO)   Status Achieved     PT SHORT TERM GOAL #5   Title Independent in HEP for RLE strengthening.  (04-05-16)   Baseline reports compliance with bed level exercises 04/15/16   Time 4   Period Weeks   Status Achieved           PT Long Term Goals - 04/29/16 1848      PT LONG TERM GOAL #1   Title Modified independent with basic transfers.  Modified Target Date: 06/24/16)   Baseline Min/guard for stand pivot transfers with RW and HO 04/29/16   Time 8   Period Weeks   Status On-going     PT LONG TERM GOAL #2   Title Modified independent with bed mobility.  (Modified Target Date: 06/24/16)   Baseline Requires min A for safety of RLE 04/29/16   Time 8   Period Weeks   Status On-going     PT LONG TERM GOAL #3   Title Amb. 100' with LRAD with S for incr. household amb.  (Modified Target Date: 06/24/16)   Baseline Amb up to 33' w/ RW and R hand orthosis at min A level   Time 8   Period Weeks   Status On-going  and revised distance     PT LONG TERM GOAL #4   Title Amb. 16' with hemiwalker with CGA for incr. community accessibility.  (Modified Target Date: 06/24/16)   Time 8   Period Weeks   Status On-going     PT LONG TERM GOAL #5   Title Negotiate steps (4) with S with use of hand rail .  (Modified Target Date: 06/24/16)   Time 8   Period Weeks   Status New     PT LONG TERM GOAL #6   Title Perform Berg balance test when appropriate and establish goal as needed.  (05-04-16)   Baseline Will likely not be appropriate due to fall risk without device.    Time 8   Period Weeks   Status Deferred               Plan - 06/01/16 0945    Clinical Impression Statement Skilled session focused on stair training per request of wife.  Note that pt not loading RLE when descending, causing hip/pelvis to move posteriorly into wife when she is assisting.  Will continue to address in future sessions so that they can get rid of ramp at home.     Rehab Potential  Good   PT Frequency 2x / week   PT Duration 8 weeks   PT Treatment/Interventions  ADLs/Self Care Home Management;DME Instruction;Balance training;Therapeutic exercise;Therapeutic activities;Functional mobility training;Stair training;Gait training;Neuromuscular re-education;Patient/family education;Orthotic Fit/Training;Passive range of motion   PT Next Visit Plan Please work on stairs with pt and wife-he needs to load RLE better when descending so not pushing posteriorly into Sean Wilkerson.   Gait training with SBQC and without device if safe for L NMR postural control.     Consulted and Agree with Plan of Care Patient   Family Member Consulted spouse Sean Wilkerson      Patient will benefit from skilled therapeutic intervention in order to improve the following deficits and impairments:  Abnormal gait, Decreased activity tolerance, Decreased balance, Decreased coordination, Decreased endurance, Decreased mobility, Decreased range of motion, Decreased strength, Impaired tone, Impaired UE functional use, Obesity  Visit Diagnosis: Unsteadiness on feet  Hemiplegia and hemiparesis following cerebral infarction affecting right non-dominant side (HCC)  Muscle weakness (generalized)  Other abnormalities of gait and mobility     Problem List Patient Active Problem List   Diagnosis Date Noted  . Slow transit constipation   . Neuropathic pain   . Stroke, acute, thrombotic ([place]) 10/08/2015  . Hemiparesis, aphasia, and dysphagia as late effect of cerebrovascular accident (CVA) ([place]) 10/08/2015  . Essential hypertension 10/08/2015  . HLD (hyperlipidemia) 10/08/2015  . Hemiplegia and hemiparesis following cerebral infarction affecting right dominant side ([place])   . Right hemiplegia ([place]) 10/06/2015  . Stroke (cerebrum) ([place]) 10/05/2015  . CVA (cerebral infarction) 10/05/2015    Cameron Sprang, PT, MPT [facility] [address] [place], [place], [address] Phone:  [phone]   Fax:  [phone] 06/01/16, 9:58 AM  Name: Shemuel Harkleroad MRN: [id number] Date of Birth: 07-Jan-1946

## 2016-06-02 ENCOUNTER — Ambulatory Visit: Payer: Medicare Other

## 2016-06-02 ENCOUNTER — Ambulatory Visit: Payer: Medicare Other | Admitting: Physical Therapy

## 2016-06-02 ENCOUNTER — Encounter: Payer: Self-pay | Admitting: Physical Therapy

## 2016-06-02 ENCOUNTER — Encounter: Payer: Self-pay | Admitting: Occupational Therapy

## 2016-06-02 ENCOUNTER — Ambulatory Visit: Payer: Medicare Other | Admitting: Occupational Therapy

## 2016-06-02 DIAGNOSIS — R2681 Unsteadiness on feet: Secondary | ICD-10-CM

## 2016-06-02 DIAGNOSIS — I69353 Hemiplegia and hemiparesis following cerebral infarction affecting right non-dominant side: Secondary | ICD-10-CM

## 2016-06-02 DIAGNOSIS — M6281 Muscle weakness (generalized): Secondary | ICD-10-CM

## 2016-06-02 DIAGNOSIS — R278 Other lack of coordination: Secondary | ICD-10-CM

## 2016-06-02 DIAGNOSIS — R2689 Other abnormalities of gait and mobility: Secondary | ICD-10-CM | POA: Diagnosis not present

## 2016-06-02 DIAGNOSIS — R471 Dysarthria and anarthria: Secondary | ICD-10-CM

## 2016-06-02 NOTE — Therapy (Signed)
[facility] [address] [place], [place], [address] Phone: [phone]   Fax:  [phone]  Speech Language Pathology Treatment  Patient Details  Name: Sean Wilkerson MRN: [id number] Date of Birth: 1946/02/28 Referring Provider: Antony Contras  Encounter Date: 06/02/2016      End of Session - 06/02/16 1748    Visit Number 17   Number of Visits 17   Date for SLP Re-Evaluation 06/16/16   Authorization Type WE NEED RENEWAL for pt's ONE VISIT WEEK OF 06-07-16   SLP Start Time 1532   SLP Stop Time  1619   SLP Time Calculation (min) 47 min   Activity Tolerance Patient tolerated treatment well      Past Medical History:  Diagnosis Date  . HLD (hyperlipidemia) 10/08/2015  . HTN (hypertension) 10/08/2015  . Hypertension   . Stroke [facility])     Past Surgical History:  Procedure Laterality Date  . NO PAST SURGERIES      There were no vitals filed for this visit.      Subjective Assessment - 06/02/16 1616    Subjective "I talked on the phone this morning (to insurance company) and they understood me"   Currently in Pain? No/denies               ADULT SLP TREATMENT - 06/02/16 1624      General Information   Behavior/Cognition Alert;Cooperative;Pleasant mood     Treatment Provided   Treatment provided Cognitive-Linquistic     Cognitive-Linquistic Treatment   Treatment focused on Dysarthria   Skilled Treatment Pt with short sentence responses in conversation with overarticulation approx 70% of the time and intelligibility 85%. 20/25 reps RMT with strong "bike pump" sound. Pt reported 80% effort at his current cmH2O setting EMST. Long discussion about needing to know more info about his voice status in order to treat decr'd intelligibility at this point. Pt and SLP agreed to put pt on hold until further evaluation of voice can be completed.     Assessment / Recommendations / Plan   Plan Other (Comment)   put pt on hold until ENT eval     Progression Toward Goals   Progression toward goals --  dysphonia continues to impact intelligibility          SLP Education - 06/02/16 1748    Education provided Yes   Education Details reasoning for ENT re-eval   Person(s) Educated Patient;Spouse   Methods Explanation   Comprehension Verbalized understanding          SLP Short Term Goals - 05/27/16 1559      SLP SHORT TERM GOAL #1   Title pt will demo 23/25 reps of EMST with proper procedure over 4 sessions   Status Not Met     SLP SHORT TERM GOAL #2   Title pt will demo oral-motor HEP with rare min A    Status Partially Met     SLP SHORT TERM GOAL #3   Title pt will demo 70dB average volume in 5 minutes simple conversation over three sessions   Status Not Met          SLP Long Term Goals - 06/02/16 1750      SLP LONG TERM GOAL #1   Title pt will demo 22/25 reps of EMST with appropriate procedure over 4 sessions   Baseline one session 04-22-16   Time 1   Period Weeks   Status On-going     SLP LONG  TERM GOAL #2   Title pt will demo oral motor HEP with rare min A over three sessions   Time 1   Period Weeks   Status On-going     SLP LONG TERM GOAL #3   Title pt will demo reduced rate of speech in 5 minutes simple conversation adequate for 95% intelligibility over 3 sessions   Time 1   Period Weeks   Status On-going          Plan - 06/02/16 1749    Clinical Impression Statement 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 In terms of dysarthria, pt continues to use compensations for intelligibility in structured tasks and simple conversation with occasional min A. Pt will likely benefit from ENT follow up as voice has not significantly improved. SLP and pt/wife discussed this during pt's session and they agree putting ST on hold after next session is a good idea.   Speech Therapy Frequency 2x / week   Duration 1 week   Treatment/Interventions  Oral motor exercises;Compensatory strategies;SLP instruction and feedback;Patient/family education;Functional tasks;Cueing hierarchy   Potential to Achieve Goals Good   Potential Considerations Severity of impairments      Patient will benefit from skilled therapeutic intervention in order to improve the following deficits and impairments:   Dysarthria and anarthria    Problem List Patient Active Problem List   Diagnosis Date Noted  . Slow transit constipation   . Neuropathic pain   . Stroke, acute, thrombotic ([place]) 10/08/2015  . Hemiparesis, aphasia, and dysphagia as late effect of cerebrovascular accident (CVA) ([place]) 10/08/2015  . Essential hypertension 10/08/2015  . HLD (hyperlipidemia) 10/08/2015  . Hemiplegia and hemiparesis following cerebral infarction affecting right dominant side ([place])   . Right hemiplegia ([place]) 10/06/2015  . Stroke (cerebrum) ([place]) 10/05/2015  . CVA (cerebral infarction) 10/05/2015    [facility] ,MS, [place]  06/02/2016, 5:54 PM  [facility] [address] [place], [place], [address] Phone: [phone]   Fax:  [phone]   Name: Sean Wilkerson MRN: [id number] Date of Birth: 01/20/1946

## 2016-06-02 NOTE — Therapy (Signed)
[facility] [address] [place], [place], [address] Phone: [phone]   Fax:  [phone]  Occupational Therapy Treatment  Patient Details  Name: Sean BirchwoodLarry Lee Wilkerson MRN: [id number] Date of Birth: 08-26-1946 Referring Provider: Dr. Pearlean BrownieSethi  Encounter Date: 06/02/2016      OT End of Session - 06/02/16 1654    Visit Number 24   Number of Visits 32   Date for OT Re-Evaluation 06/24/16   Authorization Type UMR MCR   Authorization - Visit Number 24   Authorization - Number of Visits 30   OT Start Time 1447   OT Stop Time 1530   OT Time Calculation (min) 43 min   Activity Tolerance Patient tolerated treatment well   Behavior [facility] for tasks assessed/performed      Past Medical History:  Diagnosis Date  . HLD (hyperlipidemia) 10/08/2015  . HTN (hypertension) 10/08/2015  . Hypertension   . Stroke [facility](HCC)     Past Surgical History:  Procedure Laterality Date  . NO PAST SURGERIES      There were no vitals filed for this visit.      Subjective Assessment - 06/02/16 1641    Subjective  I am doing more for myself (regarding dressing)   Pertinent History see Epic, L CVA   Patient Stated Goals to be more indpendent   Currently in Pain? No/denies   Pain Score 0-No pain                      OT Treatments/Exercises (OP) - 06/02/16 1646      Neurological Re-education Exercises   Other Exercises 1 Neuromuscular reeducation to address functional and prefunctional use of right UE.  Patient has difficulty accessing activity in right arm, and using a forced use concept creates a demand for him to use arm.  Patient requests rest breaks when asked to work while standing - he is fearful of feeling of muscle fatigue - although stand tolerance is steadily improving.  Patient easily frustrated when success is not immediate, and needs encouragement to persist.  Patient able to use right hand today to  open / close doors, drawers, and low cabinets with minimal guiding assistance.  Patient able to turn on and off water faucet and on/off light switch with min to mod assist to grade downward pressure through hand and lower arm.      Programme researcher, broadcasting/film/videolectrical Stimulation   Electrical Stimulation Location right forearm   Electrical Stimulation Action wrist/digits extension   Electrical Stimulation Parameters on/off 10 seconds, ramp time 2 sec, pulse width 250 x 10 min   Electrical Stimulation Goals Neuromuscular facilitation                OT Education - 06/02/16 1653    Education provided Yes   Education Details forced use concept for right arm function   Person(s) Educated Patient;Spouse   Methods Explanation;Demonstration;Tactile cues;Verbal cues   Comprehension Need further instruction;Tactile cues required          OT Short Term Goals - 05/31/16 1444      OT SHORT TERM GOAL #1   Title I with inital HEP.- check 04/03/16   Status Achieved     OT SHORT TERM GOAL #2   Title Pt will demonstrate understanding of RUE positioning to minimize pain and risk for injury including splint wear PRN.   Status Achieved     OT SHORT TERM GOAL #3   Title Pt will  consistently donn shirt with min A.   Status Achieved     OT SHORT TERM GOAL #4   Title Pt will perform toilet transfers with min A.   Status Achieved     OT SHORT TERM GOAL #5   Title Pt will donn pants with min A using AE PRN.   Status Achieved     OT SHORT TERM GOAL #6   Title Pt will  perform shower transfer and bathe with min A.- 05/27/2016   Status Achieved     OT SHORT TERM GOAL #7   Title Pt will tolerate P/ROM shoulder flexion to 90* with no significant increase in pain.   Status Achieved           OT Long Term Goals - 05/31/16 1445      OT LONG TERM GOAL #1   Title Pt will donn shirt with set up only.- check 06/24/2016   Status Achieved     OT LONG TERM GOAL #2   Title Pt will donn pants with minguard for balance.    Status Achieved     OT LONG TERM GOAL #3   Title Pt will donn socks and shoes with min A.   Status On-going     OT LONG TERM GOAL #4   Title Pt will use RUE as a stabilizer for ADLS at least 25 % of the time with pain less than or equal to 3/10.   Status On-going     OT LONG TERM GOAL #5   Title Pt will demonstrate P/ROM shoulder flexion to 100* with pain no greater than 3/10.   Status Achieved     OT LONG TERM GOAL #6   Title Pt will demonstrate ability to stand at countertop and retrieve an item from midlevel shelf with LUE, with supervision and no LOB.   Status Achieved               Plan - 06/02/16 1654    Clinical Impression Statement Patient making slow progress with active movement and control in right arm as needed for functional use.  Improved postural control and functional mobility   Rehab Potential Good   Clinical Impairments Affecting Rehab Potential Potential for depression- will monitor and offer support, resources as warranted; severity of deficits   OT Frequency 2x / week   OT Duration 8 weeks   OT Treatment/Interventions Self-care/ADL training;Therapeutic exercise;Patient/family education;Balance training;Splinting;Neuromuscular education;Ultrasound;Energy conservation;Therapeutic exercises;Therapeutic activities;DME and/or AE instruction;Parrafin;Cryotherapy;Electrical Stimulation;Fluidtherapy;Cognitive remediation/compensation;Visual/perceptual remediation/compensation;Passive range of motion;Contrast Bath;Moist Heat   Plan forced use RUE, postural control in standing, activity tolerance   Consulted and Agree with Plan of Care Patient      Patient will benefit from skilled therapeutic intervention in order to improve the following deficits and impairments:  Decreased coordination, Decreased range of motion, Increased edema, Decreased safety awareness, Decreased endurance, Decreased activity tolerance, Decreased knowledge of precautions, Impaired tone,  Obesity, Pain, Impaired UE functional use, Decreased knowledge of use of DME, Decreased balance, Decreased cognition, Decreased mobility, Decreased strength, Impaired perceived functional ability, Impaired vision/preception  Visit Diagnosis: 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 Slow transit constipation   . Neuropathic pain   . Stroke, acute, thrombotic (HCC) 10/08/2015  . 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  Essential hypertension 10/08/2015  . HLD (hyperlipidemia) 10/08/2015  . Hemiplegia and hemiparesis following cerebral infarction affecting right dominant side (HCC)   . Right hemiplegia (HCC) 10/06/2015  . Stroke (cerebrum) (HCC) 10/05/2015  . CVA (cerebral infarction) 10/05/2015    Collier SalinaGellert, Arvin Abello M, OTR/L 06/02/2016, 4:56 PM  [facility] [address] [place], [place], [address] Phone: [phone]   Fax:  [phone]  Name: Sean BirchwoodLarry Lee Wilkerson MRN: [id number] Date of Birth: [birth year]

## 2016-06-02 NOTE — Patient Instructions (Signed)
[facility] with your wife. We agreed it is best at this point to put ST on hold until after that evaluation, to see if we can improve your voice.

## 2016-06-03 NOTE — Therapy (Signed)
[facility] [address] [place], [place], [address] Phone: [phone]   Fax:  [phone]  Physical Therapy Treatment  Patient Details  Name: Sean Wilkerson MRN: [id number] Date of Birth: 01-22-1946 Referring Provider: Dr. Antony Contras  Encounter Date: 06/02/2016   06/02/16 1409  PT Visits / Re-Eval  Visit Number 25  Number of Visits 33  Date for PT Re-Evaluation 06/28/16  Authorization  Authorization Type [employer] Medicare  Authorization Time Period 04/29/16-06/28/16  PT Time Calculation  PT Start Time 1402  PT Stop Time 1445  PT Time Calculation (min) 43 min  PT - End of Session  Activity Tolerance Patient tolerated treatment well  Behavior During Therapy [facility] for tasks assessed/performed     Past Medical History:  Diagnosis Date  . HLD (hyperlipidemia) 10/08/2015  . HTN (hypertension) 10/08/2015  . Hypertension   . Stroke [facility])     Past Surgical History:  Procedure Laterality Date  . NO PAST SURGERIES      There were no vitals filed for this visit.   06/02/16 1408  Symptoms/Limitations  Subjective No new complaints. No pain or falls to report. Reports he is continuing to ambulate as much as possible to work on endurance. Has not tried stairs since practice in session last visit.  Patient is accompained by: Family member  Pertinent History L CVA 10-05-15 - hospitalized until Jan - D/C'd home with [place], [place] finished up on 02-25-16  Patient Stated Goals "I want to walk without anything"  Pain Assessment  Currently in Pain? No/denies  Pain Score 0        06/02/16 1411  Transfers  Transfers Sit to Stand;Stand to Sit  Sit to Stand 5: Supervision;With upper extremity assist;From bed;From chair/3-in-1;With armrests  Sit to Stand Details Verbal cues for safe use of DME/AE;Verbal cues for precautions/safety  Stand to Sit 5: Supervision;With upper extremity assist;To bed;To  chair/3-in-1;With armrests  Stand to Sit Details (indicate cue type and reason) Verbal cues for precautions/safety;Verbal cues for safe use of DME/AE  Ambulation/Gait  Ambulation/Gait Yes  Ambulation/Gait Assistance 4: Min guard; 5. Min assist (MGA with RW;)  Ambulation/Gait Assistance Details supervision to min guard assist with RW, occasional cues for right foot clearance due to occasional toe scuffing.  min guard to min assist with [facility] with cues on posture, increased right step length and weight shifting in stance phase.                               Ambulation Distance (Feet) 100 Feet (x1 with RW; 110 x1 with SBQC)  Assistive device Rolling walker;Small based quad cane  Gait Pattern Decreased hip/knee flexion - right;Decreased weight shift to right;Trunk rotated posteriorly on right;Step-through pattern;Decreased step length - right  Ambulation Surface Level;Indoor  Stairs Yes  Stairs Assistance 4: Min assist;4: Min guard  Stairs Assistance Details (indicate cue type and reason) ascending/descending with right single rail, sideways. min guard assist to ascend and min assist to descend.  pt with significant posterior shift of weight with descending to offload right leg. Used mirrors to assist pt with alingment of posture and right foot placement with descent with going down stairs. Pt able to better shift his weight on right foot with foot is placed further back vs forward on the step. Spouse not present today to learn how to assit pt with foot placement, will need to educate  her at next visit.                              Stair Management Technique One rail Right;Sideways;Step to pattern  Number of Stairs 4 (x 5 reps)  Height of Stairs 6         PT Short Term Goals - 05/27/16 1457      PT SHORT TERM GOAL #1   Title Pt will perform basic transfers with S only - wheelchair to/from mat.  (modified [place] Date: 05/27/16)   Baseline met 05/27/16   Status Achieved     PT SHORT TERM GOAL  #2   Title Stand for at least 3" with intermittent UE support at counter without KAFO on RLE with SBA for incr. independence with ADL's.  (04-05-16)   Baseline 04/13/16: pt able to stand for >/= 3 minutes with intermittent support on walker with min guard assist to supervision   Status Achieved     PT SHORT TERM GOAL #3   Title Amb. 150' with LRAD with CGA on flat, even surface.  (Modified Target Date: 05/27/16)   Baseline Met 8/3.   Status Achieved     PT SHORT TERM GOAL #4   Title Perform bed mobility including sit to/from supine with S.  (Modified Target Date: 05/27/16)   Baseline 8/7: Mod I for bed mobility (without shoes or AFO)   Status Achieved     PT SHORT TERM GOAL #5   Title Independent in HEP for RLE strengthening.  (04-05-16)   Baseline reports compliance with bed level exercises 04/15/16   Time 4   Period Weeks   Status Achieved           PT Long Term Goals - 04/29/16 1848      PT LONG TERM GOAL #1   Title Modified independent with basic transfers.  Modified Target Date: 06/24/16)   Baseline Min/guard for stand pivot transfers with RW and HO 04/29/16   Time 8   Period Weeks   Status On-going     PT LONG TERM GOAL #2   Title Modified independent with bed mobility.  (Modified Target Date: 06/24/16)   Baseline Requires min A for safety of RLE 04/29/16   Time 8   Period Weeks   Status On-going     PT LONG TERM GOAL #3   Title Amb. 100' with LRAD with S for incr. household amb.  (Modified Target Date: 06/24/16)   Baseline Amb up to 88' w/ RW and R hand orthosis at min A level   Time 8   Period Weeks   Status On-going  and revised distance     PT LONG TERM GOAL #4   Title Amb. 109' with hemiwalker with CGA for incr. community accessibility.  (Modified Target Date: 06/24/16)   Time 8   Period Weeks   Status On-going     PT LONG TERM GOAL #5   Title Negotiate steps (4) with S with use of hand rail .  (Modified Target Date: 06/24/16)   Time 8   Period Weeks   Status New      PT LONG TERM GOAL #6   Title Perform Berg balance test when appropriate and establish goal as needed.  (05-04-16)   Baseline Will likely not be appropriate due to fall risk without device.    Time 8   Period Weeks   Status Deferred  06/02/16 1410  Plan  Clinical Impression Statement Today's session continued to focus on stair training. Was noted pt tends to place right foot too far forward on descent due to ataxic movements, With minimal assistance pt was able to better position right foot on step with descent which then allowed him to improve weight shift onto the weaker leg. As reps progressed and pt's foot position improved with more posterior position he was more able to accept and shift weight onto the right foot. Pt continues to make steady progress toward goals.                                                                   Pt will benefit from skilled therapeutic intervention in order to improve on the following deficits Abnormal gait;Decreased activity tolerance;Decreased balance;Decreased coordination;Decreased endurance;Decreased mobility;Decreased range of motion;Decreased strength;Impaired tone;Impaired UE functional use;Obesity  Rehab Potential Good  PT Frequency 2x / week  PT Duration 8 weeks  PT Treatment/Interventions ADLs/Self Care Home Management;DME Instruction;Balance training;Therapeutic exercise;Therapeutic activities;Functional mobility training;Stair training;Gait training;Neuromuscular re-education;Patient/family education;Orthotic Fit/Training;Passive range of motion  PT Next Visit Plan Continue to work on stair negotiation, emphasis on right foot placement with descent to allow for improved weight shifting and less posterior lean with descending stairs.   Gait training with SBQC and without device if safe for L NMR postural control.    Consulted and Agree with Plan of Care Patient  Family Member Consulted spouse [name]      Patient will benefit from  skilled therapeutic intervention in order to improve the following deficits and impairments:  Abnormal gait, Decreased activity tolerance, Decreased balance, Decreased coordination, Decreased endurance, Decreased mobility, Decreased range of motion, Decreased strength, Impaired tone, Impaired UE functional use, Obesity  Visit Diagnosis: Hemiplegia and hemiparesis following cerebral infarction affecting right non-dominant side (HCC)  Muscle weakness (generalized)  Unsteadiness on feet  Other abnormalities of gait and mobility     Problem List Patient Active Problem List   Diagnosis Date Noted  . Slow transit constipation   . Neuropathic pain   . Stroke, acute, thrombotic ([place]) 10/08/2015  . Hemiparesis, aphasia, and dysphagia as late effect of cerebrovascular accident (CVA) ([place]) 10/08/2015  . Essential hypertension 10/08/2015  . HLD (hyperlipidemia) 10/08/2015  . Hemiplegia and hemiparesis following cerebral infarction affecting right dominant side ([place])   . Right hemiplegia ([place]) 10/06/2015  . Stroke (cerebrum) ([place]) 10/05/2015  . CVA (cerebral infarction) 10/05/2015    Willow Ora, PTA, [facility] [address], [place], [place] [address] [phone] 06/03/16, 10:40 PM   Name: Sean Wilkerson MRN: [id number] Date of Birth: [birth year]

## 2016-06-08 ENCOUNTER — Ambulatory Visit: Payer: Medicare Other | Admitting: Physical Therapy

## 2016-06-08 ENCOUNTER — Encounter: Payer: Self-pay | Admitting: Occupational Therapy

## 2016-06-08 ENCOUNTER — Ambulatory Visit: Payer: Medicare Other | Admitting: Occupational Therapy

## 2016-06-08 DIAGNOSIS — R2681 Unsteadiness on feet: Secondary | ICD-10-CM

## 2016-06-08 DIAGNOSIS — R278 Other lack of coordination: Secondary | ICD-10-CM

## 2016-06-08 DIAGNOSIS — M6281 Muscle weakness (generalized): Secondary | ICD-10-CM

## 2016-06-08 DIAGNOSIS — R2689 Other abnormalities of gait and mobility: Secondary | ICD-10-CM

## 2016-06-08 DIAGNOSIS — I69353 Hemiplegia and hemiparesis following cerebral infarction affecting right non-dominant side: Secondary | ICD-10-CM

## 2016-06-08 NOTE — Therapy (Signed)
[facility] [address] [place], [place], [address] Phone: [phone]   Fax:  [phone]  Physical Therapy Treatment  Patient Details  Name: Sean Wilkerson MRN: [id number] Date of Birth: 01-18-1946 Referring Provider: Dr. Delia Heady  Encounter Date: 06/08/2016      PT End of Session - 06/08/16 1716    Visit Number 26   Number of Visits 33   Date for PT Re-Evaluation 06/28/16   Authorization Type [employer] Medicare   Authorization Time Period 04/29/16-06/28/16   PT Start Time 1402   PT Stop Time 1448   PT Time Calculation (min) 46 min   Equipment Utilized During Treatment Gait belt   Activity Tolerance Patient tolerated treatment well;Patient limited by fatigue  Seated rest breaks reqyuired between stair negotiation trials   Behavior During Therapy [facility] for tasks assessed/performed      Past Medical History:  Diagnosis Date  . HLD (hyperlipidemia) 10/08/2015  . HTN (hypertension) 10/08/2015  . Hypertension   . Stroke [facility])     Past Surgical History:  Procedure Laterality Date  . NO PAST SURGERIES      There were no vitals filed for this visit.      Subjective Assessment - 06/08/16 1406    Subjective Pt inquiring as to why he is so fatigued despite being very motivated.  Pt reports no significant changes. Is excited to be walking with quad cane during therapy.   Pertinent History L CVA 10-05-15 - hospitalized until Jan - D/C'd home with Home Health PT, OT ST - Home health finished up on 02-25-16   Patient Stated Goals "I want to walk without anything"   Currently in Pain? No/denies                         [facility] Adult PT Treatment/Exercise - 06/08/16 0001      Transfers   Transfers Sit to Stand;Stand to Sit   Sit to Stand 5: Supervision   Sit to Stand Details (indicate cue type and reason) intermittent cueing for RLE positioning prior to standing   Stand to Sit 5: Supervision     Ambulation/Gait   Ambulation/Gait Yes   Ambulation/Gait Assistance 5: Supervision;4: Min guard   Ambulation/Gait Assistance Details Gait x80' with [facility] with min guard, tactile cueing for upright posture, for full lateral weight shift to R side, and for anterior weight shift during LLE advancement. Gait x80' with RW, R h/o (due to pt fatigue following stair negotiation) x80' with (S), no overt LOB.    Ambulation Distance (Feet) 160 Feet  total   Assistive device Rolling walker;Small based quad cane   Gait Pattern Decreased hip/knee flexion - right;Decreased weight shift to right;Trunk rotated posteriorly on right;Step-through pattern;Decreased step length - right   Ambulation Surface Level;Indoor   Stairs Yes   Stairs Assistance 5: Supervision;4: Min guard;4: Min assist   Stairs Assistance Details (indicate cue type and reason) Negotiation 4 stairs x4 trials (seated rest breaks between trials) with R rail to simulate home entrance. Initial trial with PT providing (S)-min guard, patient ascending and descending laterally with LUE on rail, cueing for attention to RLE placement on stair when ascending, for anterior weight shift onto RLE when descending, and cueing for wider BOS when both feet on stair to increase pt stability. Performed subsequent trial with PT providing (S)-min guard with pt ascending diagonally (between forward-facing and laterally) to enable pt to comfortably fit both feet on  stair; pt did appear more stable using this technique. Final 2 trials performed with wife providing min guard; cueing required for wife positioning on stairs, for pt to slow speed of movement to allow wife to cue for foot placement on step.    Stair Management Technique One rail Right;Step to pattern;Sideways   Number of Stairs 16  4 stairs x4 trials (seated rest breaks between trials)   Height of Stairs 6                PT Education - 06/08/16 1704    Education provided Yes   Education Details  Sequencing for stair negotiation.  Explained and provided paper handout explaining changes post-CVA, including fatigue, emotional changes, and difficulty sleeping.   Person(s) Educated Patient;Spouse   Methods Explanation;Handout;Demonstration;Verbal cues   Comprehension Verbalized understanding;Need further instruction;Verbal cues required  Will likely need to review stair negotiation technique          PT Short Term Goals - 05/27/16 1457      PT SHORT TERM GOAL #1   Title Pt will perform basic transfers with S only - wheelchair to/from mat.  (modified [place] Date: 05/27/16)   Baseline met 05/27/16   Status Achieved     PT SHORT TERM GOAL #2   Title Stand for at least 3" with intermittent UE support at counter without KAFO on RLE with SBA for incr. independence with ADL's.  (04-05-16)   Baseline 04/13/16: pt able to stand for >/= 3 minutes with intermittent support on walker with min guard assist to supervision   Status Achieved     PT SHORT TERM GOAL #3   Title Amb. 150' with LRAD with CGA on flat, even surface.  (Modified Target Date: 05/27/16)   Baseline Met 8/3.   Status Achieved     PT SHORT TERM GOAL #4   Title Perform bed mobility including sit to/from supine with S.  (Modified Target Date: 05/27/16)   Baseline 8/7: Mod I for bed mobility (without shoes or AFO)   Status Achieved     PT SHORT TERM GOAL #5   Title Independent in HEP for RLE strengthening.  (04-05-16)   Baseline reports compliance with bed level exercises 04/15/16   Time 4   Period Weeks   Status Achieved           PT Long Term Goals - 04/29/16 1848      PT LONG TERM GOAL #1   Title Modified independent with basic transfers.  Modified Target Date: 06/24/16)   Baseline Min/guard for stand pivot transfers with RW and HO 04/29/16   Time 8   Period Weeks   Status On-going     PT LONG TERM GOAL #2   Title Modified independent with bed mobility.  (Modified Target Date: 06/24/16)   Baseline Requires min A for  safety of RLE 04/29/16   Time 8   Period Weeks   Status On-going     PT LONG TERM GOAL #3   Title Amb. 100' with LRAD with S for incr. household amb.  (Modified Target Date: 06/24/16)   Baseline Amb up to 80' w/ RW and R hand orthosis at min A level   Time 8   Period Weeks   Status On-going  and revised distance     PT LONG TERM GOAL #4   Title Amb. 79' with hemiwalker with CGA for incr. community accessibility.  (Modified Target Date: 06/24/16)   Time 8   Period Weeks  Status On-going     PT LONG TERM GOAL #5   Title Negotiate steps (4) with S with use of hand rail .  (Modified Target Date: 06/24/16)   Time 8   Period Weeks   Status New     PT LONG TERM GOAL #6   Title Perform Berg balance test when appropriate and establish goal as needed.  (05-04-16)   Baseline Will likely not be appropriate due to fall risk without device.    Time 8   Period Weeks   Status Deferred               Plan - 06/08/16 1717    Clinical Impression Statement Session focused on stair training with wife. Pt demonstrates posterior lean only when very fatigued (on last trial today); appears to benefit from negotiating stairs diagonally to enable pt to easily fit both feet on stairs. Will require continued stair training with wife to ensure pt safety prior to temporary ramp being removed from home (9/11).    Rehab Potential Good   PT Frequency 2x / week   PT Duration 8 weeks   PT Treatment/Interventions ADLs/Self Care Home Management;DME Instruction;Balance training;Therapeutic exercise;Therapeutic activities;Functional mobility training;Stair training;Gait training;Neuromuscular re-education;Patient/family education;Orthotic Fit/Training;Passive range of motion   PT Next Visit Plan Continue to work on stair negotiation (with wife), gait training with [facility] and without device if safe for L NMR, postural control. Temporary ramp planned to be removed from home on 9/11, per wife.   Consulted and Agree with  Plan of Care Patient;Family member/caregiver   Family Member Consulted spouse [name]      Patient will benefit from skilled therapeutic intervention in order to improve the following deficits and impairments:  Abnormal gait, Decreased activity tolerance, Decreased balance, Decreased coordination, Decreased endurance, Decreased mobility, Decreased range of motion, Decreased strength, Impaired tone, Impaired UE functional use, Obesity  Visit Diagnosis: Other abnormalities of gait and mobility  Hemiplegia and hemiparesis following cerebral infarction affecting right non-dominant side (HCC)  Unsteadiness on feet  Muscle weakness (generalized)     Problem List Patient Active Problem List   Diagnosis Date Noted  . Slow transit constipation   . Neuropathic pain   . Stroke, acute, thrombotic ([place]) 10/08/2015  . Hemiparesis, aphasia, and dysphagia as late effect of cerebrovascular accident (CVA) ([place]) 10/08/2015  . Essential hypertension 10/08/2015  . HLD (hyperlipidemia) 10/08/2015  . Hemiplegia and hemiparesis following cerebral infarction affecting right dominant side ([place])   . Right hemiplegia ([place]) 10/06/2015  . Stroke (cerebrum) ([place]) 10/05/2015  . CVA (cerebral infarction) 10/05/2015   Billie Ruddy, PT, DPT [facility] [address] [place], [place], [address] Phone: [phone]   Fax:  [phone] 06/08/16, 5:23 PM  Name: Sean Wilkerson MRN: [id number] Date of Birth: 02/28/1946

## 2016-06-08 NOTE — Therapy (Signed)
[facility] [address] [place], [place], [address] Phone: [phone]   Fax:  [phone]  Occupational Therapy Treatment  Patient Details  Name: Sean Wilkerson MRN: [id number] Date of Birth: 1946-01-18 Referring Provider: Dr. Pearlean BrownieSethi  Encounter Date: 06/08/2016      OT End of Session - 06/08/16 1428    Visit Number 25   Number of Visits 32   Date for OT Re-Evaluation 06/24/16   Authorization Type UMR MCR   Authorization - Visit Number 25   Authorization - Number of Visits 30   OT Start Time 1318   OT Stop Time 1400   OT Time Calculation (min) 42 min   Activity Tolerance Patient tolerated treatment well   Behavior During Therapy [facility] for tasks assessed/performed      Past Medical History:  Diagnosis Date  . HLD (hyperlipidemia) 10/08/2015  . HTN (hypertension) 10/08/2015  . Hypertension   . Stroke [facility](HCC)     Past Surgical History:  Procedure Laterality Date  . NO PAST SURGERIES      There were no vitals filed for this visit.      Subjective Assessment - 06/08/16 1329    Subjective  (P)  Dressing is going better every day.  I feel better every day after I work out here.     Pertinent History (P)  see Epic, L CVA   Patient Stated Goals (P)  to be more indpendent   Currently in Pain? (P)  No/denies                      OT Treatments/Exercises (OP) - 06/08/16 0001      Neurological Re-education Exercises   Other Exercises 1 Neuromuscular reeducation to address alignment and activation of right upper extremity.  Weight bearing (body on arm) on  extended right arm, discomfort at wrist with increasing wrist extesnion, realigned - and worked in pain free ranges.  Patient apraxic, performs better with physical guidance than verbal cueing.  Followed with movement of right arm on unstable surface (tipping a chair) within mid range elbow flex/ext, with elbow ext/flex.  Patient showing increased  volitional control, and ability to stop movement and change directions.     Other Exercises 2 In standing worked in modified plantigrade to address scapula stabilizers.  Worked to weight right forearm, and unweight left forearm with mod facilitation to keep body weight right.  Patient has difficulty sustaining muscle activation due to inattention, somatosensory loss, as well as hemiplegia.  Followed with tall standing with weight biased onto right leg for activation of right arm - AAROM low to mid reach using long dowel in upright position.                  OT Education - 06/08/16 1427    Education provided Yes   Education Details alignment as realted to muscle activation right UE, and wrist   Person(s) Educated Patient   Methods Explanation;Demonstration;Verbal cues;Tactile cues   Comprehension Need further instruction;Tactile cues required;Verbal cues required          OT Short Term Goals - 05/31/16 1444      OT SHORT TERM GOAL #1   Title I with inital HEP.- check 04/03/16   Status Achieved     OT SHORT TERM GOAL #2   Title Pt will demonstrate understanding of RUE positioning to minimize pain and risk for injury including splint wear PRN.   Status Achieved  OT SHORT TERM GOAL #3   Title Pt will consistently donn shirt with min A.   Status Achieved     OT SHORT TERM GOAL #4   Title Pt will perform toilet transfers with min A.   Status Achieved     OT SHORT TERM GOAL #5   Title Pt will donn pants with min A using AE PRN.   Status Achieved     OT SHORT TERM GOAL #6   Title Pt will  perform shower transfer and bathe with min A.- 05/27/2016   Status Achieved     OT SHORT TERM GOAL #7   Title Pt will tolerate P/ROM shoulder flexion to 90* with no significant increase in pain.   Status Achieved           OT Long Term Goals - 05/31/16 1445      OT LONG TERM GOAL #1   Title Pt will donn shirt with set up only.- check 06/24/2016   Status Achieved     OT LONG  TERM GOAL #2   Title Pt will donn pants with minguard for balance.   Status Achieved     OT LONG TERM GOAL #3   Title Pt will donn socks and shoes with min A.   Status On-going     OT LONG TERM GOAL #4   Title Pt will use RUE as a stabilizer for ADLS at least 25 % of the time with pain less than or equal to 3/10.   Status On-going     OT LONG TERM GOAL #5   Title Pt will demonstrate P/ROM shoulder flexion to 100* with pain no greater than 3/10.   Status Achieved     OT LONG TERM GOAL #6   Title Pt will demonstrate ability to stand at countertop and retrieve an item from midlevel shelf with LUE, with supervision and no LOB.   Status Achieved               Plan - 06/08/16 1428    Clinical Impression Statement Patient showing improved ability to complete basic self care skills, slower progress in functional use of his right arm, although he has shown steady improvement in his ability to activate this arm.   Rehab Potential Good   Clinical Impairments Affecting Rehab Potential Potential for depression- will monitor and offer support, resources as warranted; severity of deficits   OT Frequency 2x / week   OT Duration 8 weeks   OT Treatment/Interventions Self-care/ADL training;Therapeutic exercise;Patient/family education;Balance training;Splinting;Neuromuscular education;Ultrasound;Energy conservation;Therapeutic exercises;Therapeutic activities;DME and/or AE instruction;Parrafin;Cryotherapy;Electrical Stimulation;Fluidtherapy;Cognitive remediation/compensation;Visual/perceptual remediation/compensation;Passive range of motion;Contrast Bath;Moist Heat   Plan alignment on right side and activation of shoulder, elbow, forearm.  standing balance and endurance   Consulted and Agree with Plan of Care Patient      Patient will benefit from skilled therapeutic intervention in order to improve the following deficits and impairments:  Decreased coordination, Decreased range of motion,  Increased edema, Decreased safety awareness, Decreased endurance, Decreased activity tolerance, Decreased knowledge of precautions, Impaired tone, Obesity, Pain, Impaired UE functional use, Decreased knowledge of use of DME, Decreased balance, Decreased cognition, Decreased mobility, Decreased strength, Impaired perceived functional ability, Impaired vision/preception  Visit Diagnosis: 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 Slow transit constipation   . Neuropathic pain   .  Stroke, acute, thrombotic (HCC) 10/08/2015  . 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 Essential hypertension 10/08/2015  . HLD (hyperlipidemia) 10/08/2015  . Hemiplegia and hemiparesis following cerebral infarction affecting right dominant side (HCC)   . Right hemiplegia (HCC) 10/06/2015  . Stroke (cerebrum) (HCC) 10/05/2015  . CVA (cerebral infarction) 10/05/2015    Collier Salina, OTR/L 06/08/2016, 2:38 PM  [facility] [address] [place], [place], [address] Phone: [phone]   Fax:  [phone]  Name: Sean Wilkerson MRN: [id number] Date of Birth: 06/21/1946

## 2016-06-10 ENCOUNTER — Encounter: Payer: Self-pay | Admitting: Rehabilitation

## 2016-06-10 ENCOUNTER — Ambulatory Visit: Payer: Medicare Other | Admitting: Occupational Therapy

## 2016-06-10 ENCOUNTER — Encounter: Payer: Self-pay | Admitting: Occupational Therapy

## 2016-06-10 ENCOUNTER — Ambulatory Visit: Payer: Medicare Other

## 2016-06-10 ENCOUNTER — Ambulatory Visit: Payer: Medicare Other | Admitting: Rehabilitation

## 2016-06-10 DIAGNOSIS — R2681 Unsteadiness on feet: Secondary | ICD-10-CM

## 2016-06-10 DIAGNOSIS — I69353 Hemiplegia and hemiparesis following cerebral infarction affecting right non-dominant side: Secondary | ICD-10-CM

## 2016-06-10 DIAGNOSIS — M6281 Muscle weakness (generalized): Secondary | ICD-10-CM

## 2016-06-10 DIAGNOSIS — R471 Dysarthria and anarthria: Secondary | ICD-10-CM

## 2016-06-10 DIAGNOSIS — R278 Other lack of coordination: Secondary | ICD-10-CM

## 2016-06-10 DIAGNOSIS — R2689 Other abnormalities of gait and mobility: Secondary | ICD-10-CM | POA: Diagnosis not present

## 2016-06-10 NOTE — Therapy (Signed)
[facility] [place] [facility] [address] [place], [place], [address] Phone: [phone]   Fax:  [phone]  Occupational Therapy Treatment  Patient Details  Name: Sean Wilkerson MRN: [id number] Date of Birth: 1946-08-11 Referring Provider: Dr. Pearlean Brownie  Encounter Date: 06/10/2016      OT End of Session - 06/10/16 1430    Visit Number 26   Number of Visits 32   Date for OT Re-Evaluation 06/24/16   Authorization Type UMR MCR   Authorization Time Period g code and PN every 10th visit   Authorization - Visit Number 26   Authorization - Number of Visits 30   OT Start Time 1315   OT Stop Time 1400   OT Time Calculation (min) 45 min   Activity Tolerance Patient tolerated treatment well   Behavior During Therapy [facility] for tasks assessed/performed      Past Medical History:  Diagnosis Date  . HLD (hyperlipidemia) 10/08/2015  . HTN (hypertension) 10/08/2015  . Hypertension   . Stroke [facility])     Past Surgical History:  Procedure Laterality Date  . NO PAST SURGERIES      There were no vitals filed for this visit.      Subjective Assessment - 06/10/16 1334    Subjective  I may be taking a break with Baldo Ash (SLP)   Pertinent History see Epic, L CVA   Patient Stated Goals to be more indpendent   Currently in Pain? No/denies                      OT Treatments/Exercises (OP) - 06/10/16 0001      ADLs   ADL Comments Discussed current plan of care and progress made thus far.  Patient made aware of 6 remaining visits under this plan of care.       Neurological Re-education Exercises   Other Exercises 1 Neuromuscular reeducation to address volitional control of right arm.  On unstable surface, following manual preparation of right shoulder, and proprioceptive input, patient able to flex and extend shoulder.  Patient able to pause movement and change direction today without compensatory trunk movement.  Min facilitation  to reduce tension in head and neck - and maintain visual attention (in very distarcting environment) to right UE.  Worked with hand on unstable surface, and patient with erect trunk, but flexed forward at hip for even larger shoulder flexion excursion.       Manual Therapy   Manual Therapy Joint mobilization   Manual therapy comments In supine, worked to improve glenohumeral joint mobility.  Patient with unstable scapula - and limited dissociation between scapula and humerus.  With gentle, sustained traction, able to range right shoulder to ~ 110 degrees of shoulder felxion without pain.  ,                OT Education - 06/10/16 1429    Education provided Yes   Education Details Spoke with patient and PT about walking with wife in home - for toileting.  Patient indicates that they are still using the mecahnical lift for toilet transfers.  Wife not present for this session, but intending to retrun for PT session.     Person(s) Educated Patient   Methods Explanation;Demonstration   Comprehension Verbalized understanding;Returned demonstration          OT Short Term Goals - 05/31/16 1444      OT SHORT TERM GOAL #1   Title I with inital HEP.- check  04/03/16   Status Achieved     OT SHORT TERM GOAL #2   Title Pt will demonstrate understanding of RUE positioning to minimize pain and risk for injury including splint wear PRN.   Status Achieved     OT SHORT TERM GOAL #3   Title Pt will consistently donn shirt with min A.   Status Achieved     OT SHORT TERM GOAL #4   Title Pt will perform toilet transfers with min A.   Status Achieved     OT SHORT TERM GOAL #5   Title Pt will donn pants with min A using AE PRN.   Status Achieved     OT SHORT TERM GOAL #6   Title Pt will  perform shower transfer and bathe with min A.- 05/27/2016   Status Achieved     OT SHORT TERM GOAL #7   Title Pt will tolerate P/ROM shoulder flexion to 90* with no significant increase in pain.   Status  Achieved           OT Long Term Goals - 05/31/16 1445      OT LONG TERM GOAL #1   Title Pt will donn shirt with set up only.- check 06/24/2016   Status Achieved     OT LONG TERM GOAL #2   Title Pt will donn pants with minguard for balance.   Status Achieved     OT LONG TERM GOAL #3   Title Pt will donn socks and shoes with min A.   Status On-going     OT LONG TERM GOAL #4   Title Pt will use RUE as a stabilizer for ADLS at least 25 % of the time with pain less than or equal to 3/10.   Status On-going     OT LONG TERM GOAL #5   Title Pt will demonstrate P/ROM shoulder flexion to 100* with pain no greater than 3/10.   Status Achieved     OT LONG TERM GOAL #6   Title Pt will demonstrate ability to stand at countertop and retrieve an item from midlevel shelf with LUE, with supervision and no LOB.   Status Achieved               Plan - 06/10/16 1430    Clinical Impression Statement Patient shows slow and steady improvement in his ability to activate right arm musculature, yet this reamins pre-functional.  Patient and wife reporting increased independence with ADL.   Rehab Potential Good   Clinical Impairments Affecting Rehab Potential Potential for depression- will monitor and offer support, resources as warranted; severity of deficits   OT Frequency 2x / week   OT Duration 8 weeks   Plan NMR RUE, standing balance and endurance, standing for UE activation   Consulted and Agree with Plan of Care Patient      Patient will benefit from skilled therapeutic intervention in order to improve the following deficits and impairments:  Decreased coordination, Decreased range of motion, Increased edema, Decreased safety awareness, Decreased endurance, Decreased activity tolerance, Decreased knowledge of precautions, Impaired tone, Obesity, Pain, Impaired UE functional use, Decreased knowledge of use of DME, Decreased balance, Decreased cognition, Decreased mobility, Decreased  strength, Impaired perceived functional ability, Impaired vision/preception  Visit Diagnosis: 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  Slow transit constipation   . Neuropathic pain   . Stroke, acute, thrombotic ([facility]) 10/08/2015  . 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 Essential hypertension 10/08/2015  . HLD (hyperlipidemia) 10/08/2015  . Hemiplegia and hemiparesis following cerebral infarction affecting right dominant side (HCC)   . Right hemiplegia (HCC) 10/06/2015  . Stroke (cerebrum) (HCC) 10/05/2015  . CVA (cerebral infarction) 10/05/2015    Collier SalinaGellert, Kristin M, OTR/L 06/10/2016, 2:34 PM  [place] [facility] [address] [place], [place], [address] Phone: [phone]   Fax:  [phone]  Name: Sean BirchwoodLarry Lee Wilkerson MRN: [id number] Date of Birth: [birth year]

## 2016-06-10 NOTE — Therapy (Signed)
[facility] [address] [place], [place], [address] Phone: [phone]   Fax:  [phone]  Physical Therapy Treatment  Patient Details  Name: Sean Wilkerson MRN: [id number] Date of Birth: 1946/01/11 Referring Provider: Dr. Antony Contras  Encounter Date: 06/10/2016      PT End of Session - 06/10/16 1653    Visit Number 27   Number of Visits 33   Date for PT Re-Evaluation 06/28/16   Authorization Type [employer] Medicare   Authorization Time Period 04/29/16-06/28/16   PT Start Time 1402   PT Stop Time 1446   PT Time Calculation (min) 44 min   Equipment Utilized During Treatment Gait belt   Activity Tolerance Patient tolerated treatment well;Patient limited by fatigue  Seated rest breaks reqyuired between stair negotiation trials   Behavior During Therapy [facility] for tasks assessed/performed      Past Medical History:  Diagnosis Date  . HLD (hyperlipidemia) 10/08/2015  . HTN (hypertension) 10/08/2015  . Hypertension   . Stroke [facility])     Past Surgical History:  Procedure Laterality Date  . NO PAST SURGERIES      There were no vitals filed for this visit.      Subjective Assessment - 06/10/16 1408    Subjective I want to know when I can walk at home with the cane.     Patient is accompained by: Family member   Pertinent History L CVA 10-05-15 - hospitalized until Jan - D/C'd home with [place], [place] finished up on 02-25-16   Patient Stated Goals "I want to walk without anything"   Currently in Pain? No/denies          Self care:   Addressed stair training during session with wife Melene Muller assisting to ensure safety at home.  Pt and wife performed x 1 rep (4, 6" steps with R rail) with pt ascending sideways and descending in diagonal (to the L).  Pt able to perform with wife with improved ability to load RLE during descent, however still requires min cues for upright posture throughout.   Progressed to gait with [facility] with wife assisting x 85'.  Provided cues for wife on how to best assist on R side (posterior and lateral with hand at chest (or in axilla) and at hips/pelvis.  Returned demonstration very well during session therefore discussed beginning to ambulate with cane at home in controlled environment (up/down hallway).  Pt and wife willing to purchase case as they have SPC at home if he progresses to that AD.  Both verbalized understanding.  Wife voiced concern with getting in/out of shower at home as they are still doing from w/c.  Feel that he could do this with RW, therefore initiated practicing during session to simulate home.  Had pt step backwards into shower and forwards out of shower.  Note difficulty advancing RLE into shower due to decreased hamstring strength, therefore feel that wife may have to assist.  Also would like to practice again during therapy without brace on, as this is how they do it at home.  Would like to ensure that ankle is safe to do so before allowing them to do at home.  Pt and wife verbalized understanding.    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  Ambulated x 80' with min/guard A with cues for posture, increased R hip protraction as well as increased R step length.  Continue to progress very  well.                         PT Education - 06/10/16 1652    Education provided Yes   Education Details Education on stair training, educaiton on walking with RW to master bathroom when able (pending urgency) at home, beginning to walk hallway with [facility] (they are going to order one).     Person(s) Educated Patient;Spouse   Methods Explanation;Demonstration   Comprehension Verbalized understanding;Returned demonstration          PT Short Term Goals - 05/27/16 1457      PT SHORT TERM GOAL #1   Title Pt will perform basic transfers with S only - wheelchair to/from mat.  (modified Target Date: 05/27/16)   Baseline met 05/27/16    Status Achieved     PT SHORT TERM GOAL #2   Title Stand for at least 3" with intermittent UE support at counter without KAFO on RLE with SBA for incr. independence with ADL's.  (04-05-16)   Baseline 04/13/16: pt able to stand for >/= 3 minutes with intermittent support on walker with min guard assist to supervision   Status Achieved     PT SHORT TERM GOAL #3   Title Amb. 150' with LRAD with CGA on flat, even surface.  (Modified Target Date: 05/27/16)   Baseline Met 8/3.   Status Achieved     PT SHORT TERM GOAL #4   Title Perform bed mobility including sit to/from supine with S.  (Modified Target Date: 05/27/16)   Baseline 8/7: Mod I for bed mobility (without shoes or AFO)   Status Achieved     PT SHORT TERM GOAL #5   Title Independent in HEP for RLE strengthening.  (04-05-16)   Baseline reports compliance with bed level exercises 04/15/16   Time 4   Period Weeks   Status Achieved           PT Long Term Goals - 04/29/16 1848      PT LONG TERM GOAL #1   Title Modified independent with basic transfers.  Modified Target Date: 06/24/16)   Baseline Min/guard for stand pivot transfers with RW and HO 04/29/16   Time 8   Period Weeks   Status On-going     PT LONG TERM GOAL #2   Title Modified independent with bed mobility.  (Modified Target Date: 06/24/16)   Baseline Requires min A for safety of RLE 04/29/16   Time 8   Period Weeks   Status On-going     PT LONG TERM GOAL #3   Title Amb. 100' with LRAD with S for incr. household amb.  (Modified Target Date: 06/24/16)   Baseline Amb up to 28' w/ RW and R hand orthosis at min A level   Time 8   Period Weeks   Status On-going  and revised distance     PT LONG TERM GOAL #4   Title Amb. 30' with hemiwalker with CGA for incr. community accessibility.  (Modified Target Date: 06/24/16)   Time 8   Period Weeks   Status On-going     PT LONG TERM GOAL #5   Title Negotiate steps (4) with S with use of hand rail .  (Modified Target Date:  06/24/16)   Time 8   Period Weeks   Status New     PT LONG TERM GOAL #6   Title Perform Berg balance test when appropriate and establish goal as needed.  (  05-04-16)   Baseline Will likely not be appropriate due to fall risk without device.    Time 8   Period Weeks   Status Deferred               Plan - 06/10/16 1653    Clinical Impression Statement Skilled session focused on addressing stair negotiation with wife, gait with [facility] with wife assisting to begin this at home, addressing getting in/out of shower with RW.     Rehab Potential Good   PT Frequency 2x / week   PT Duration 8 weeks   PT Treatment/Interventions ADLs/Self Care Home Management;DME Instruction;Balance training;Therapeutic exercise;Therapeutic activities;Functional mobility training;Stair training;Gait training;Neuromuscular re-education;Patient/family education;Orthotic Fit/Training;Passive range of motion   PT Next Visit Plan Continue to work on stair negotiation (with wife), gait training with [facility] (with wife and begin to incorportate carpet and obstacles) and without device if safe for L NMR, postural control. Temporary ramp planned to be removed from home on 9/11, per wife.   Consulted and Agree with Plan of Care Patient;Family member/caregiver   Family Member Consulted spouse [name]      Patient will benefit from skilled therapeutic intervention in order to improve the following deficits and impairments:  Abnormal gait, Decreased activity tolerance, Decreased balance, Decreased coordination, Decreased endurance, Decreased mobility, Decreased range of motion, Decreased strength, Impaired tone, Impaired UE functional use, Obesity  Visit Diagnosis: Hemiplegia and hemiparesis following cerebral infarction affecting right non-dominant side (HCC)  Muscle weakness (generalized)  Unsteadiness on feet  Other abnormalities of gait and mobility     Problem List Patient Active Problem List   Diagnosis Date Noted   . Slow transit constipation   . Neuropathic pain   . Stroke, acute, thrombotic ([place]) 10/08/2015  . Hemiparesis, aphasia, and dysphagia as late effect of cerebrovascular accident (CVA) ([place]) 10/08/2015  . Essential hypertension 10/08/2015  . HLD (hyperlipidemia) 10/08/2015  . Hemiplegia and hemiparesis following cerebral infarction affecting right dominant side ([place])   . Right hemiplegia ([place]) 10/06/2015  . Stroke (cerebrum) ([place]) 10/05/2015  . CVA (cerebral infarction) 10/05/2015    Cameron Sprang, PT, MPT [facility] [address] [place], [place], [address] Phone: [phone]   Fax:  [phone] 06/10/16, 4:56 PM  Name: Garv Kuechle MRN: [id number] Date of Birth: 1946-06-15

## 2016-06-10 NOTE — Therapy (Signed)
[place] [address] [place], [place], [address] Phone: [phone]   Fax:  [phone]  Speech Language Pathology Treatment  Patient Details  Name: Sean Wilkerson MRN: [id number] Date of Birth: [birth year] Referring Provider: Antony Contras  Encounter Date: 06/10/2016      End of Session - 06/10/16 1708    Visit Number 18   Number of Visits 17   Date for SLP Re-Evaluation 06/16/16   SLP Start Time 1448   SLP Stop Time  1530   SLP Time Calculation (min) 42 min   Activity Tolerance Patient tolerated treatment well      Past Medical History:  Diagnosis Date  . HLD (hyperlipidemia) 10/08/2015  . HTN (hypertension) 10/08/2015  . Hypertension   . Stroke [facility])     Past Surgical History:  Procedure Laterality Date  . NO PAST SURGERIES      There were no vitals filed for this visit.      Subjective Assessment - 06/10/16 1453    Subjective "He's come a long way" (wife) - pt/wife have chose to hold off on trying to obtain a voice referral    Currently in Pain? No/denies               ADULT SLP TREATMENT - 06/10/16 1458      General Information   Behavior/Cognition Alert;Cooperative;Pleasant mood     Treatment Provided   Treatment provided Cognitive-Linquistic     Cognitive-Linquistic Treatment   Treatment focused on Dysarthria   Skilled Treatment Pt/wife have chosen not to pursue ENT eval at this time.  Pt req'd min A rarely with HEP. SLP worked with pt on overarticulation in increasing utterance length. Pt's accuracy/intelligibility was better with sentences (90%) than with extended discourse re: opinions (80%). Intelligibility was 100% and 90%, respectively. SLP reiterated to pt to cont to use respiratory muscle training and to adjust device 1/4 turn when he accomplishes 22-23/25 strong "bike pump" sounds.      Assessment / Recommendations / Plan   Plan Discharge SLP treatment due to  (comment)  pt/wife have decided not to pursue ENTat this time     Progression Toward Goals   Progression toward goals --  see goal update          SLP Education - 06/10/16 1707    Education provided Yes   Education Details how and when to adjust the EMST-150   Person(s) Educated Patient;Spouse   Methods Explanation;Demonstration   Comprehension Verbalized understanding          SLP Short Term Goals - 05/27/16 1559      SLP SHORT TERM GOAL #1   Title pt will demo 23/25 reps of EMST with proper procedure over 4 sessions   Status Not Met     SLP SHORT TERM GOAL #2   Title pt will demo oral-motor HEP with rare min A    Status Partially Met     SLP SHORT TERM GOAL #3   Title pt will demo 70dB average volume in 5 minutes simple conversation over three sessions   Status Not Met          SLP Long Term Goals - 06/10/16 1711      SLP LONG TERM GOAL #1   Title pt will demo 22/25 reps of EMST with appropriate procedure over 4 sessions   Baseline one session 04-22-16   Time 1   Period Weeks   Status Not Met  SLP LONG TERM GOAL #2   Title pt will demo oral motor HEP with rare min A over three sessions   Status Partially Met  pt req'd rare min A - for two sessions     SLP LONG TERM GOAL #3   Title pt will demo reduced rate of speech in 5 minutes simple conversation adequate for 95% intelligibility over 3 sessions   Status Not Met          Plan - 06/10/16 1708    Clinical Impression Statement Pt was seen for one addtional visit from pt request, with current LTGs enforced today. See goal update. He continues to experience moderate dysphonia affecting intellgiblity. In terms of dysarthria, pt continues to use compensations for intelligibility in structured tasks and simple conversation with occasional min A. Pt and wife have chosen to hold on any ENT eval at this time, although he will likely benefit from ENT follow up as voice has not significantly improved and still  experiencing reduced measurments with sustained /a/ (max phonation time should be longer). Since pt is holding off on ENT eval for now, he will be discharged.   Treatment/Interventions Oral motor exercises;Compensatory strategies;SLP instruction and feedback;Patient/family education;Functional tasks;Cueing hierarchy   Potential to Achieve Goals Good   Potential Considerations Severity of impairments      Patient will benefit from skilled therapeutic intervention in order to improve the following deficits and impairments:   Dysarthria and anarthria   SPEECH THERAPY RENEWAL/DISCHARGE SUMMARY  Visits from Start of Care: 18  Current functional level related to goals / functional outcomes: Pt's LTGs were enforced for one additional session today. See goal update above for details.  Overall pt made good gains with speech intelligibility in simple and mod complex conversation. As complexity increases, pt loses focus on overarticulation and speeds up, thus decr'ing intelligibility.  Additionally, pt's voice status remains moderately hoarse. His sustained /a/ is lower than what it would expected to be. 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 Due to this, vocal adduction exercises were not attempted with pt as they would likely be contraindicated. SLPs suggested more indepth voice evaluation at [facility] but pt/wife have declined at this time. SLPs opinion is that more information is needed to know how to properly treat pt's voice difficulties.     Remaining deficits: Mod hoarseness, mild-mod dysarthria depending upon complexity of conversation.   Education / Equipment: HEP for dysarthria, need to relax when talking to reduce any muscle tension dysphonia.  Plan: Patient agrees to discharge.  Patient goals were partially met. Patient is being discharged due to the patient's request.  ?????       Problem List Patient  Active Problem List   Diagnosis Date Noted  . Slow transit constipation   . Neuropathic pain   . Stroke, acute, thrombotic ([place]) 10/08/2015  . Hemiparesis, aphasia, and dysphagia as late effect of cerebrovascular accident (CVA) ([place]) 10/08/2015  . Essential hypertension 10/08/2015  . HLD (hyperlipidemia) 10/08/2015  . Hemiplegia and hemiparesis following cerebral infarction affecting right dominant side ([place])   . Right hemiplegia ([place]) 10/06/2015  . Stroke (cerebrum) ([place]) 10/05/2015  . CVA (cerebral infarction) 10/05/2015    [address] ,MS, CCC-SLP  06/10/2016, 5:15 PM  [place] [address] [place], [place], [address] Phone: [phone]   Fax:  [phone]   Name: Sean Wilkerson MRN: [id number] Date of Birth: 19-Mar-1946

## 2016-06-10 NOTE — Patient Instructions (Signed)
Continue to do your blowing exercises. Turn the knob 1/5-1/4 turn when you make 22-23 of 25 good strong sounds like a "bike pump" like we have talked about.

## 2016-06-10 NOTE — Addendum Note (Signed)
Addended by: Verdie MosherSCHINKE, CARL B on: 06/10/2016 05:26 PM   Modules accepted: Orders

## 2016-06-14 ENCOUNTER — Ambulatory Visit: Payer: Medicare Other | Admitting: Occupational Therapy

## 2016-06-14 ENCOUNTER — Encounter: Payer: Self-pay | Admitting: Rehabilitation

## 2016-06-14 ENCOUNTER — Ambulatory Visit: Payer: Medicare Other | Admitting: Rehabilitation

## 2016-06-14 VITALS — BP 141/71 | HR 63

## 2016-06-14 DIAGNOSIS — M6281 Muscle weakness (generalized): Secondary | ICD-10-CM

## 2016-06-14 DIAGNOSIS — R2681 Unsteadiness on feet: Secondary | ICD-10-CM

## 2016-06-14 DIAGNOSIS — I69353 Hemiplegia and hemiparesis following cerebral infarction affecting right non-dominant side: Secondary | ICD-10-CM

## 2016-06-14 DIAGNOSIS — R278 Other lack of coordination: Secondary | ICD-10-CM

## 2016-06-14 DIAGNOSIS — R2689 Other abnormalities of gait and mobility: Secondary | ICD-10-CM | POA: Diagnosis not present

## 2016-06-14 NOTE — Therapy (Signed)
[facility] [address] [place], [place], [address] Phone: [phone]   Fax:  [phone]  Physical Therapy Treatment  Patient Details  Name: Sean Wilkerson MRN: [id number] Date of Birth: 06-01-1946 Referring Provider: Dr. Antony Contras  Encounter Date: 06/14/2016      PT End of Session - 06/14/16 1415    Visit Number 28   Number of Visits 33   Date for PT Re-Evaluation 06/28/16   Authorization Type UHC Medicare   Authorization Time Period 04/29/16-06/28/16   PT Start Time 1315   PT Stop Time 1400   PT Time Calculation (min) 45 min   Equipment Utilized During Treatment Gait belt   Activity Tolerance Patient tolerated treatment well;Patient limited by fatigue  Seated rest breaks reqyuired between stair negotiation trials   Behavior During Therapy [facility] for tasks assessed/performed      Past Medical History:  Diagnosis Date  . HLD (hyperlipidemia) 10/08/2015  . HTN (hypertension) 10/08/2015  . Hypertension   . Stroke [facility])     Past Surgical History:  Procedure Laterality Date  . NO PAST SURGERIES      There were no vitals filed for this visit.      Subjective Assessment - 06/14/16 1414    Subjective Per wife, Mondays are hard for pt as he was really busy over the weekend.     Patient is accompained by: Family member   Pertinent History L CVA 10-05-15 - hospitalized until Jan - D/C'd home with [place], [place] finished up on 02-25-16   Patient Stated Goals "I want to walk without anything"   Currently in Pain? No/denies          TA:  Addressed issue of getting in/out of walk in shower with RW during session.  Wife, Melene Muller states that ledge is 5 1/4" tall therefore simulated this with balance beams stacked together, placing chair for pt to sit on following stepping "into/out of shower."  Removed brace to better simulate how they perform at home and also to assess whether pt would be  better able to lift RLE into and out of shower.  Pt continues to require assist from wife to elevate RLE into and out of shower, however PT still felt the need to provide min A to control pts trunk, esp when stepping into shower.  Note that he continues to allow COG to shift posteriorly causing mild LOB backwards, requiring assist to prevent this.  Would like to continue practicing during sessions before allowing them to complete at home.     Gait;  Also continue to practice stairs with wife during session to be safe and ready for removal of ramp on 9/11.  Performed x 1 rep during session with cues for foot placement when ascending and descending as well as continued cues to place RLE on step then maintain upright posture and improve WB through RLE to prevent shifting posteriorly.  Would benefit from continued practice prior to ramp removal but making progress.  Performed gait during session x 50' (all that pt could tolerate due to fatigue) with SBQC.  Continue to provide cues and light facilitation for upright posture, R hip protraction and cues for increased stride length, esp increased L step length to encourage increased time spent in RLE stance.                         PT Education - 06/14/16 1414  Education provided Yes   Education Details Education on safety with stairs and practicing step to get into and out of walk in shower   Person(s) Educated Patient;Spouse   Methods Explanation;Demonstration   Comprehension Verbalized understanding;Returned demonstration;Need further instruction          PT Short Term Goals - 05/27/16 1457      PT SHORT TERM GOAL #1   Title Pt will perform basic transfers with S only - wheelchair to/from mat.  (modified Target Date: 05/27/16)   Baseline met 05/27/16   Status Achieved     PT SHORT TERM GOAL #2   Title Stand for at least 3" with intermittent UE support at counter without KAFO on RLE with SBA for incr. independence with ADL's.   (04-05-16)   Baseline 04/13/16: pt able to stand for >/= 3 minutes with intermittent support on walker with min guard assist to supervision   Status Achieved     PT SHORT TERM GOAL #3   Title Amb. 150' with LRAD with CGA on flat, even surface.  (Modified Target Date: 05/27/16)   Baseline Met 8/3.   Status Achieved     PT SHORT TERM GOAL #4   Title Perform bed mobility including sit to/from supine with S.  (Modified Target Date: 05/27/16)   Baseline 8/7: Mod I for bed mobility (without shoes or AFO)   Status Achieved     PT SHORT TERM GOAL #5   Title Independent in HEP for RLE strengthening.  (04-05-16)   Baseline reports compliance with bed level exercises 04/15/16   Time 4   Period Weeks   Status Achieved           PT Long Term Goals - 04/29/16 1848      PT LONG TERM GOAL #1   Title Modified independent with basic transfers.  Modified Target Date: 06/24/16)   Baseline Min/guard for stand pivot transfers with RW and HO 04/29/16   Time 8   Period Weeks   Status On-going     PT LONG TERM GOAL #2   Title Modified independent with bed mobility.  (Modified Target Date: 06/24/16)   Baseline Requires min A for safety of RLE 04/29/16   Time 8   Period Weeks   Status On-going     PT LONG TERM GOAL #3   Title Amb. 100' with LRAD with S for incr. household amb.  (Modified Target Date: 06/24/16)   Baseline Amb up to 75' w/ RW and R hand orthosis at min A level   Time 8   Period Weeks   Status On-going  and revised distance     PT LONG TERM GOAL #4   Title Amb. 23' with hemiwalker with CGA for incr. community accessibility.  (Modified Target Date: 06/24/16)   Time 8   Period Weeks   Status On-going     PT LONG TERM GOAL #5   Title Negotiate steps (4) with S with use of hand rail .  (Modified Target Date: 06/24/16)   Time 8   Period Weeks   Status New     PT LONG TERM GOAL #6   Title Perform Berg balance test when appropriate and establish goal as needed.  (05-04-16)   Baseline Will  likely not be appropriate due to fall risk without device.    Time 8   Period Weeks   Status Deferred               Plan - 06/14/16  1415    Clinical Impression Statement Skilled session focused on continuing to address getting in/out of shower with RW.  Practiced without brace but with shoe to simulated ledge height (5 1/4") to better simulate home.  Feel that he needs continued practice prior to doing at home.     Rehab Potential Good   PT Frequency 2x / week   PT Duration 8 weeks   PT Treatment/Interventions ADLs/Self Care Home Management;DME Instruction;Balance training;Therapeutic exercise;Therapeutic activities;Functional mobility training;Stair training;Gait training;Neuromuscular re-education;Patient/family education;Orthotic Fit/Training;Passive range of motion   PT Next Visit Plan Continue to work on stair negotiation (with wife), gait training with [facility] (with wife and begin to incorportate carpet and obstacles) and without device if safe for L NMR, postural control. Temporary ramp planned to be removed from home on 9/11, per wife.   Consulted and Agree with Plan of Care Patient;Family member/caregiver   Family Member Consulted spouse [name]      Patient will benefit from skilled therapeutic intervention in order to improve the following deficits and impairments:  Abnormal gait, Decreased activity tolerance, Decreased balance, Decreased coordination, Decreased endurance, Decreased mobility, Decreased range of motion, Decreased strength, Impaired tone, Impaired UE functional use, Obesity  Visit Diagnosis: Hemiplegia and hemiparesis following cerebral infarction affecting right non-dominant side (HCC)  Muscle weakness (generalized)  Unsteadiness on feet  Other lack of coordination     Problem List Patient Active Problem List   Diagnosis Date Noted  . Slow transit constipation   . Neuropathic pain   . Stroke, acute, thrombotic ([place]) 10/08/2015  . Hemiparesis,  aphasia, and dysphagia as late effect of cerebrovascular accident (CVA) ([place]) 10/08/2015  . Essential hypertension 10/08/2015  . HLD (hyperlipidemia) 10/08/2015  . Hemiplegia and hemiparesis following cerebral infarction affecting right dominant side ([place])   . Right hemiplegia ([place]) 10/06/2015  . Stroke (cerebrum) ([place]) 10/05/2015  . CVA (cerebral infarction) 10/05/2015    Cameron Sprang, PT, MPT [facility] [address] [place], [place], [address] Phone: [phone]   Fax:  [phone] 06/14/16, 2:19 PM  Name: Sean Wilkerson MRN: [id number] Date of Birth: Dec 11, 1945

## 2016-06-14 NOTE — Therapy (Signed)
[facility] [address] [place], [place], [address] Phone: [phone]   Fax:  [phone]  Occupational Therapy Treatment  Patient Details  Name: Sean Wilkerson MRN: [id number] Date of Birth: Mar 10, 1946 Referring Provider: Dr. Pearlean Brownie  Encounter Date: 06/14/2016      OT End of Session - 06/14/16 1455    Visit Number 27   Number of Visits 32   Date for OT Re-Evaluation 06/24/16   Authorization Type UMR MCR   Authorization Time Period g code and PN every 10th visit   Authorization - Visit Number 27   Authorization - Number of Visits 30   OT Start Time 1401   OT Stop Time 1442   OT Time Calculation (min) 41 min   Activity Tolerance Patient tolerated treatment well      Past Medical History:  Diagnosis Date  . HLD (hyperlipidemia) 10/08/2015  . HTN (hypertension) 10/08/2015  . Hypertension   . Stroke [facility])     Past Surgical History:  Procedure Laterality Date  . NO PAST SURGERIES      Vitals:   06/14/16 1406  BP: (!) 141/71  Pulse: 63        Subjective Assessment - 06/14/16 1406    Subjective  I feel exhausted today   Pertinent History see Epic, L CVA   Patient Stated Goals to be more indpendent   Currently in Pain? No/denies                      OT Treatments/Exercises (OP) - 06/14/16 0001      ADLs   LB Dressing Addresed donnng and doffing socks and shoes using step stool and as a functional activity to encourage forward weight shift and shifting onto RLE.  Pt able to don/doff L sock and shoe as well as don/doff R sock and  doff R shoe using step stool. Pt also able to use shoe button. Pt only needs min a to don R shoe with brace.     Functional Mobility Addresessed functional mobility with emphasis on trunk control, using RUE as much as possible when advancing walker, improved step through with RLE and weight shifting onto R with functional ambulation using walker.      Neurological Re-education Exercises   Other Exercises 1 Neuro re ed to address dynamic standing balance with emphasis on using RUE as stabilizer for balance with activities requiring both lateral and A/P weight shifts. Pt needs min a  and max cues to use RUE as stabilizer for this activity.                   OT Short Term Goals - 06/14/16 1428      OT SHORT TERM GOAL #1   Title I with inital HEP.- check 04/03/16   Status Achieved     OT SHORT TERM GOAL #2   Title Pt will demonstrate understanding of RUE positioning to minimize pain and risk for injury including splint wear PRN.   Status Achieved     OT SHORT TERM GOAL #3   Title Pt will consistently donn shirt with min A.   Status Achieved     OT SHORT TERM GOAL #4   Title Pt will perform toilet transfers with min A.   Status Achieved     OT SHORT TERM GOAL #5   Title Pt will donn pants with min A using AE PRN.   Status Achieved  OT SHORT TERM GOAL #6   Title Pt will  perform shower transfer and bathe with min A.- 05/27/2016   Status Achieved     OT SHORT TERM GOAL #7   Title Pt will tolerate P/ROM shoulder flexion to 90* with no significant increase in pain.   Status Achieved           OT Long Term Goals - 06/14/16 1427      OT LONG TERM GOAL #1   Title Pt will donn shirt with set up only.- check 06/24/2016   Status Achieved     OT LONG TERM GOAL #2   Title Pt will donn pants with minguard for balance.   Status Achieved     OT LONG TERM GOAL #3   Title Pt will donn socks and shoes with min A.   Status Achieved  Pt is able to don/doff L sock and shoes as well as don/doff R sock and doff R shoe. Pt also able to use shoe button to hook and unhook shoes laces.  Pt needs min a to don R shoe with brace.      OT LONG TERM GOAL #4   Title Pt will use RUE as a stabilizer for ADLS at least 25 % of the time with pain less than or equal to 3/10.   Status On-going     OT LONG TERM GOAL #5   Title Pt will  demonstrate P/ROM shoulder flexion to 100* with pain no greater than 3/10.   Status Achieved     OT LONG TERM GOAL #6   Title Pt will demonstrate ability to stand at countertop and retrieve an item from midlevel shelf with LUE, with supervision and no LOB.   Status Achieved               Plan - 06/14/16 1450    Clinical Impression Statement Pt progressing toward goals and continues to gain in independence in basic self care and mobility.   Rehab Potential Good   Clinical Impairments Affecting Rehab Potential Potential for depression- will monitor and offer support, resources as warranted; severity of deficits   OT Frequency 2x / week   OT Duration 8 weeks   OT Treatment/Interventions Self-care/ADL training;Therapeutic exercise;Patient/family education;Balance training;Splinting;Neuromuscular education;Ultrasound;Energy conservation;Therapeutic exercises;Therapeutic activities;DME and/or AE instruction;Parrafin;Cryotherapy;Electrical Stimulation;Fluidtherapy;Cognitive remediation/compensation;Visual/perceptual remediation/compensation;Passive range of motion;Contrast Bath;Moist Heat   Plan Use of LUE for stabilizer duirng basic self care tasks, NMR for RUE, activity tolerance, standing tasks   Consulted and Agree with Plan of Care Patient      Patient will benefit from skilled therapeutic intervention in order to improve the following deficits and impairments:  Decreased coordination, Decreased range of motion, Increased edema, Decreased safety awareness, Decreased endurance, Decreased activity tolerance, Decreased knowledge of precautions, Impaired tone, Obesity, Pain, Impaired UE functional use, Decreased knowledge of use of DME, Decreased balance, Decreased cognition, Decreased mobility, Decreased strength, Impaired perceived functional ability, Impaired vision/preception  Visit Diagnosis: Hemiplegia and hemiparesis following cerebral infarction affecting right non-dominant side  (HCC)  Unsteadiness on feet  Other lack of coordination  Muscle weakness (generalized)    Problem List Patient Active Problem List   Diagnosis Date Noted  . Slow transit constipation   . Neuropathic pain   . Stroke, acute, thrombotic (HCC) 10/08/2015  . 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 Essential hypertension 10/08/2015  . HLD (hyperlipidemia) 10/08/2015  . Hemiplegia and hemiparesis following cerebral infarction affecting right dominant  side (HCC)   . Right hemiplegia (HCC) 10/06/2015  . Stroke (cerebrum) (HCC) 10/05/2015  . CVA (cerebral infarction) 10/05/2015    Norton PastelPulaski, Karen Halliday, OTR/L 06/14/2016, 2:57 PM  [place] [address] [place], [place], [address] Phone: [phone]   Fax:  [phone]  Name: Adolphus BirchwoodLarry Lee [place] MRN: [id number] Date of Birth: [birth year]

## 2016-06-17 ENCOUNTER — Ambulatory Visit: Payer: Medicare Other | Admitting: Occupational Therapy

## 2016-06-17 ENCOUNTER — Ambulatory Visit: Payer: Medicare Other | Admitting: Rehabilitation

## 2016-06-17 ENCOUNTER — Encounter: Payer: Self-pay | Admitting: Rehabilitation

## 2016-06-17 VITALS — BP 154/73 | HR 65

## 2016-06-17 DIAGNOSIS — M6281 Muscle weakness (generalized): Secondary | ICD-10-CM

## 2016-06-17 DIAGNOSIS — I69353 Hemiplegia and hemiparesis following cerebral infarction affecting right non-dominant side: Secondary | ICD-10-CM

## 2016-06-17 DIAGNOSIS — R2689 Other abnormalities of gait and mobility: Secondary | ICD-10-CM | POA: Diagnosis not present

## 2016-06-17 DIAGNOSIS — R278 Other lack of coordination: Secondary | ICD-10-CM

## 2016-06-17 DIAGNOSIS — R2681 Unsteadiness on feet: Secondary | ICD-10-CM

## 2016-06-17 NOTE — Therapy (Signed)
[facility] [address] [place], [place], [address] Phone: [phone]   Fax:  [phone]  Occupational Therapy Treatment  Patient Details  Name: Sean Wilkerson MRN: [id number] Date of Birth: Jan 14, 1946 Referring Provider: Dr. Pearlean Brownie  Encounter Date: 06/17/2016      OT End of Session - 06/17/16 1703    Visit Number 28   Number of Visits 32   Date for OT Re-Evaluation 06/24/16   Authorization Type UMR MCR   Authorization Time Period g code and PN every 10th visit   Authorization - Visit Number 28   Authorization - Number of Visits 30   OT Start Time 1317   OT Stop Time 1358   OT Time Calculation (min) 41 min   Activity Tolerance Patient tolerated treatment well      Past Medical History:  Diagnosis Date  . HLD (hyperlipidemia) 10/08/2015  . HTN (hypertension) 10/08/2015  . Hypertension   . Stroke [facility])     Past Surgical History:  Procedure Laterality Date  . NO PAST SURGERIES      Vitals:   06/17/16 1328  BP: (!) 154/73  Pulse: 65        Subjective Assessment - 06/17/16 1325    Subjective  I was nauseated yesterday   Pertinent History see Epic, L CVA   Patient Stated Goals to be more indpendent   Currently in Pain? No/denies                      OT Treatments/Exercises (OP) - 06/17/16 0001      Neurological Re-education Exercises   Other Exercises 1 Neuro re ed through use of functional cooking task to focus on increasing weight bearing on RLE, trunk control, alignment, activity tolerance, incorporation of RUE into task as a stabilzer. Pt needs max cues to shift and maintain weight onto RLE, as well as encouragement to maximize activity tolerance. With cues, pt tolerated standing acitivty for 25 minutes with one rest break. Pt also needs hand over hand to use RUE as stabilzer during functional tasks.                   OT Short Term Goals - 06/17/16 1701      OT  SHORT TERM GOAL #1   Title I with inital HEP.- check 04/03/16   Status Achieved     OT SHORT TERM GOAL #2   Title Pt will demonstrate understanding of RUE positioning to minimize pain and risk for injury including splint wear PRN.   Status Achieved     OT SHORT TERM GOAL #3   Title Pt will consistently donn shirt with min A.   Status Achieved     OT SHORT TERM GOAL #4   Title Pt will perform toilet transfers with min A.   Status Achieved     OT SHORT TERM GOAL #5   Title Pt will donn pants with min A using AE PRN.   Status Achieved     OT SHORT TERM GOAL #6   Title Pt will  perform shower transfer and bathe with min A.- 05/27/2016   Status Achieved     OT SHORT TERM GOAL #7   Title Pt will tolerate P/ROM shoulder flexion to 90* with no significant increase in pain.   Status Achieved           OT Long Term Goals - 06/17/16 1701      OT LONG  TERM GOAL #1   Title Pt will donn shirt with set up only.- check 06/24/2016   Status Achieved     OT LONG TERM GOAL #2   Title Pt will donn pants with minguard for balance.   Status Achieved     OT LONG TERM GOAL #3   Title Pt will donn socks and shoes with min A.   Status Achieved  Pt is able to don/doff L sock and shoes as well as don/doff R sock and doff R shoe. Pt also able to use shoe button to hook and unhook shoes laces.  Pt needs min a to don R shoe with brace.      OT LONG TERM GOAL #4   Title Pt will use RUE as a stabilizer for ADLS at least 25 % of the time with pain less than or equal to 3/10.   Status On-going     OT LONG TERM GOAL #5   Title Pt will demonstrate P/ROM shoulder flexion to 100* with pain no greater than 3/10.   Status Achieved     OT LONG TERM GOAL #6   Title Pt will demonstrate ability to stand at countertop and retrieve an item from midlevel shelf with LUE, with supervision and no LOB.   Status Achieved               Plan - 06/17/16 1701    Clinical Impression Statement Pt with slow  progresssion toward goals. Pt needs encouragement to maximize ability.   Rehab Potential Good   Clinical Impairments Affecting Rehab Potential Potential for depression- will monitor and offer support, resources as warranted; severity of deficits   OT Frequency 2x / week   OT Duration 8 weeks   OT Treatment/Interventions Self-care/ADL training;Therapeutic exercise;Patient/family education;Balance training;Splinting;Neuromuscular education;Ultrasound;Energy conservation;Therapeutic exercises;Therapeutic activities;DME and/or AE instruction;Parrafin;Cryotherapy;Electrical Stimulation;Fluidtherapy;Cognitive remediation/compensation;Visual/perceptual remediation/compensation;Passive range of motion;Contrast Bath;Moist Heat   Plan use of LUE for stabilizer for basic tasks, NMR for RUE, balance, activity tolerance, standing activities   Consulted and Agree with Plan of Care Patient      Patient will benefit from skilled therapeutic intervention in order to improve the following deficits and impairments:  Decreased coordination, Decreased range of motion, Increased edema, Decreased safety awareness, Decreased endurance, Decreased activity tolerance, Decreased knowledge of precautions, Impaired tone, Obesity, Pain, Impaired UE functional use, Decreased knowledge of use of DME, Decreased balance, Decreased cognition, Decreased mobility, Decreased strength, Impaired perceived functional ability, Impaired vision/preception  Visit Diagnosis: Hemiplegia and hemiparesis following cerebral infarction affecting right non-dominant side (HCC)  Unsteadiness on feet  Muscle weakness (generalized)  Other lack of coordination    Problem List Patient Active Problem List   Diagnosis Date Noted  . Slow transit constipation   . Neuropathic pain   . Stroke, acute, thrombotic (HCC) 10/08/2015  . 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 Essential  hypertension 10/08/2015  . HLD (hyperlipidemia) 10/08/2015  . Hemiplegia and hemiparesis following cerebral infarction affecting right dominant side (HCC)   . Right hemiplegia (HCC) 10/06/2015  . Stroke (cerebrum) (HCC) 10/05/2015  . CVA (cerebral infarction) 10/05/2015    Norton PastelPulaski, Karen Halliday, OTR/L 06/17/2016, 5:05 PM  [place] [facility] [address] [place], [place], [address] Phone: [phone]   Fax:  [phone]  Name: Adolphus BirchwoodLarry Lee Pedraza MRN: [id number] Date of Birth: 26-Apr-1946

## 2016-06-17 NOTE — Therapy (Signed)
[facility] [address] [place], [place], [address] Phone: [phone]   Fax:  [phone]  Physical Therapy Treatment  Patient Details  Name: Sean Wilkerson MRN: [id number] Date of Birth: 05-Aug-1946 Referring Provider: Dr. Antony Contras  Encounter Date: 06/17/2016      PT End of Session - 06/17/16 1444    Visit Number 29   Number of Visits 33   Date for PT Re-Evaluation 06/28/16   Authorization Type UHC Medicare   Authorization Time Period 04/29/16-06/28/16   PT Start Time 1406  PT late from previous appt   PT Stop Time 1444   PT Time Calculation (min) 38 min   Equipment Utilized During Treatment Gait belt   Activity Tolerance Patient tolerated treatment well;Patient limited by fatigue   Behavior During Therapy WFL for tasks assessed/performed      Past Medical History:  Diagnosis Date  . HLD (hyperlipidemia) 10/08/2015  . HTN (hypertension) 10/08/2015  . Hypertension   . Stroke [facility])     Past Surgical History:  Procedure Laterality Date  . NO PAST SURGERIES      There were no vitals filed for this visit.      Subjective Assessment - 06/17/16 1443    Subjective Reports stomach feeling upset from something he ate last night.    Patient is accompained by: Family member   Pertinent History L CVA 10-05-15 - hospitalized until Jan - D/C'd home with [place], [place] finished up on 02-25-16   Patient Stated Goals "I want to walk without anything"   Currently in Pain? No/denies           NMR:  Continue to work on [employer] for RLE and postural control during session.  Performed gait x 85' without AD with focus on improved posture, increased forward and lateral weight shift over RLE during stance, slower gait speed to allow increased WB on RLE, forward R hip protraction during stance as well as cues for increased B step length.  Utilized mirror for part of gait to improve carryover via  visual cues.  Continue to address R proximal hip weakness with transitional movements from tall kneeling to L half kneeling.  Requires +2 A during task for safety with PT provided assist at R axilla for posture and at pelvis to encourage forward hip protraction during transition.  Performed x 3 reps during session with marked fatigue.  Progressed to standing task advancing LLE to/from 6" step, again to increase R LE activation, weight shift and WB.  Initially had R knee buckle requiring total A back to max, however with increased cues able to complete at mod/max A level.  Performed x 4 reps.  Ended session with seated reaching task to the R with RUE in WB position on therapist leg.  Note increased fatigue and stretch in RUE, therefore only performed x 2 rep.  Note marked fatigue following session, but pt able to complete.                        PT Education - 06/17/16 1443    Education provided Yes   Education Details importance of WB on RLE   Person(s) Educated Patient;Spouse   Methods Explanation   Comprehension Verbalized understanding          PT Short Term Goals - 05/27/16 1457      PT SHORT TERM GOAL #1   Title Pt will perform basic  transfers with S only - wheelchair to/from mat.  (modified [place] Date: 05/27/16)   Baseline met 05/27/16   Status Achieved     PT SHORT TERM GOAL #2   Title Stand for at least 3" with intermittent UE support at counter without KAFO on RLE with SBA for incr. independence with ADL's.  (04-05-16)   Baseline 04/13/16: pt able to stand for >/= 3 minutes with intermittent support on walker with min guard assist to supervision   Status Achieved     PT SHORT TERM GOAL #3   Title Amb. 150' with LRAD with CGA on flat, even surface.  (Modified [place] Date: 05/27/16)   Baseline Met 8/3.   Status Achieved     PT SHORT TERM GOAL #4   Title Perform bed mobility including sit to/from supine with S.  (Modified Target Date: 05/27/16)   Baseline 8/7: Mod I  for bed mobility (without shoes or AFO)   Status Achieved     PT SHORT TERM GOAL #5   Title Independent in HEP for RLE strengthening.  (04-05-16)   Baseline reports compliance with bed level exercises 04/15/16   Time 4   Period Weeks   Status Achieved           PT Long Term Goals - 04/29/16 1848      PT LONG TERM GOAL #1   Title Modified independent with basic transfers.  Modified Target Date: 06/24/16)   Baseline Min/guard for stand pivot transfers with RW and HO 04/29/16   Time 8   Period Weeks   Status On-going     PT LONG TERM GOAL #2   Title Modified independent with bed mobility.  (Modified Target Date: 06/24/16)   Baseline Requires min A for safety of RLE 04/29/16   Time 8   Period Weeks   Status On-going     PT LONG TERM GOAL #3   Title Amb. 100' with LRAD with S for incr. household amb.  (Modified Target Date: 06/24/16)   Baseline Amb up to 66' w/ RW and R hand orthosis at min A level   Time 8   Period Weeks   Status On-going  and revised distance     PT LONG TERM GOAL #4   Title Amb. 35' with hemiwalker with CGA for incr. community accessibility.  (Modified Target Date: 06/24/16)   Time 8   Period Weeks   Status On-going     PT LONG TERM GOAL #5   Title Negotiate steps (4) with S with use of hand rail .  (Modified Target Date: 06/24/16)   Time 8   Period Weeks   Status New     PT LONG TERM GOAL #6   Title Perform Berg balance test when appropriate and establish goal as needed.  (05-04-16)   Baseline Will likely not be appropriate due to fall risk without device.    Time 8   Period Weeks   Status Deferred               Plan - 06/17/16 1445    Clinical Impression Statement Skilled session focused on gait without device and closed chain NMR for proximal RLE stability and activation.  Pt needing multiple rest breaks, but tolerated all activity.     Rehab Potential Good   PT Frequency 2x / week   PT Duration 8 weeks   PT Treatment/Interventions ADLs/Self  Care Home Management;DME Instruction;Balance training;Therapeutic exercise;Therapeutic activities;Functional mobility training;Stair training;Gait training;Neuromuscular re-education;Patient/family education;Orthotic Fit/Training;Passive range  of motion   PT Next Visit Plan G-CODE and PN!!!  Continue to work on stair negotiation (with wife), gait training with [facility] (with wife and begin to incorportate carpet and obstacles) and without device if safe for L NMR, postural control. Temporary ramp planned to be removed from home on 9/11, per wife.   Consulted and Agree with Plan of Care Patient;Family member/caregiver   Family Member Consulted spouse [name]      Patient will benefit from skilled therapeutic intervention in order to improve the following deficits and impairments:  Abnormal gait, Decreased activity tolerance, Decreased balance, Decreased coordination, Decreased endurance, Decreased mobility, Decreased range of motion, Decreased strength, Impaired tone, Impaired UE functional use, Obesity  Visit Diagnosis: Hemiplegia and hemiparesis following cerebral infarction affecting right non-dominant side (HCC)  Unsteadiness on feet  Muscle weakness (generalized)  Other abnormalities of gait and mobility     Problem List Patient Active Problem List   Diagnosis Date Noted  . Slow transit constipation   . Neuropathic pain   . Stroke, acute, thrombotic ([place]) 10/08/2015  . Hemiparesis, aphasia, and dysphagia as late effect of cerebrovascular accident (CVA) ([place]) 10/08/2015  . Essential hypertension 10/08/2015  . HLD (hyperlipidemia) 10/08/2015  . Hemiplegia and hemiparesis following cerebral infarction affecting right dominant side ([place])   . Right hemiplegia ([place]) 10/06/2015  . Stroke (cerebrum) ([place]) 10/05/2015  . CVA (cerebral infarction) 10/05/2015    Cameron Sprang, PT, MPT [facility] [address] [place], [place], [address] Phone:  [phone]   Fax:  [phone] 06/17/16, 8:13 PM  Name: Tykel Badie MRN: [id number] Date of Birth: November 29, 1945

## 2016-06-22 ENCOUNTER — Encounter: Payer: Self-pay | Admitting: Occupational Therapy

## 2016-06-22 ENCOUNTER — Ambulatory Visit: Payer: Medicare Other | Admitting: Occupational Therapy

## 2016-06-22 ENCOUNTER — Ambulatory Visit: Payer: Medicare Other | Attending: Neurology

## 2016-06-22 DIAGNOSIS — I69353 Hemiplegia and hemiparesis following cerebral infarction affecting right non-dominant side: Secondary | ICD-10-CM | POA: Insufficient documentation

## 2016-06-22 DIAGNOSIS — M6281 Muscle weakness (generalized): Secondary | ICD-10-CM

## 2016-06-22 DIAGNOSIS — R2689 Other abnormalities of gait and mobility: Secondary | ICD-10-CM | POA: Insufficient documentation

## 2016-06-22 DIAGNOSIS — R278 Other lack of coordination: Secondary | ICD-10-CM | POA: Insufficient documentation

## 2016-06-22 DIAGNOSIS — R2681 Unsteadiness on feet: Secondary | ICD-10-CM | POA: Diagnosis present

## 2016-06-22 NOTE — Therapy (Signed)
[facility] [address] [place], [place], [address] Phone: [phone]   Fax:  [phone]  Physical Therapy Treatment  Patient Details  Name: Sean Wilkerson MRN: [id number] Date of Birth: May 13, 1946 Referring Provider: Dr. Antony Contras  Encounter Date: 06/22/2016      PT End of Session - 06/22/16 1702    Visit Number 30   Number of Visits 33   Date for PT Re-Evaluation 06/28/16   Authorization Type [employer] Medicare   Authorization Time Period 04/29/16-06/28/16   PT Start Time 1401   PT Stop Time 1443   PT Time Calculation (min) 42 min   Equipment Utilized During Treatment Gait belt   Activity Tolerance Patient tolerated treatment well;Patient limited by fatigue   Behavior During Therapy [facility] for tasks assessed/performed      Past Medical History:  Diagnosis Date  . HLD (hyperlipidemia) 10/08/2015  . HTN (hypertension) 10/08/2015  . Hypertension   . Stroke [facility])     Past Surgical History:  Procedure Laterality Date  . NO PAST SURGERIES      There were no vitals filed for this visit.      Subjective Assessment - 06/22/16 1408    Subjective Pt denied falls or changes since last visit.    Patient is accompained by: Family member  [name]-wife   Pertinent History L CVA 10-05-15 - hospitalized until Jan - D/C'd home with [place], [place] finished up on 02-25-16   Patient Stated Goals "I want to walk without anything"   Currently in Pain? No/denies                         [facility] Adult PT Treatment/Exercise - 06/22/16 1416      Bed Mobility   Bed Mobility Supine to Sit;Sit to Supine   Supine to Sit 5: Supervision;HOB flat   Supine to Sit Details (indicate cue type and reason) S to ensure safety, as pt experienced difficulty hooking LLE around RLE 2/2 R AFO donned.   Sit to Supine 5: Supervision;HOB flat   Sit to Supine - Details (indicate cue type and reason) S for safety,  demo'd correct technique.     Transfers   Transfers Sit to Stand;Stand to Sit   Sit to Stand 5: Supervision;With upper extremity assist;From chair/3-in-1   Sit to Stand Details --  S for safety, demo'd safe technique.   Stand to Sit 6: Modified independent (Device/Increase time)   Stand to Sit Details (indicate cue type and reason) --  safe technique demo'd     Ambulation/Gait   Ambulation/Gait Yes   Ambulation/Gait Assistance 5: Supervision;4: Min guard   Ambulation/Gait Assistance Details S with RW, min guard with SBQC with VC's and tactile cues to improve upright posture, R LE stance time, and L step length.   Ambulation Distance (Feet) 75 Feet  50'x3 with RW and 35' with SBQC.   Assistive device Rolling walker;Small based quad cane   Gait Pattern Decreased hip/knee flexion - right;Decreased weight shift to right;Trunk rotated posteriorly on right;Step-through pattern;Decreased step length - right;Decreased stance time - right   Ambulation Surface Level;Indoor   Stairs Yes   Stairs Assistance 5: Supervision;4: Min guard   Stairs Assistance Details (indicate cue type and reason) Cues for sequencing and weight shifting. PT performed with pt during first trial (4 steps) and then had wife perform with pt (min guard) and PT instructed and provided min  guard to S for safety.  Cues for R foot placement safety.   Stair Management Technique One rail Right;Step to pattern;Sideways;Forwards  forward to ascend and sideways to descend   Number of Stairs 8   Height of Stairs 6                PT Education - 06/22/16 1702    Education provided Yes   Education Details PT educated pt and wife on traversing stairs correctly and safely.   Person(s) Educated Patient   Methods Explanation;Demonstration;Tactile cues;Verbal cues;Handout   Comprehension Returned demonstration;Verbalized understanding          PT Short Term Goals - 05/27/16 1457      PT SHORT TERM GOAL #1   Title Pt will  perform basic transfers with S only - wheelchair to/from mat.  (modified Target Date: 05/27/16)   Baseline met 05/27/16   Status Achieved     PT SHORT TERM GOAL #2   Title Stand for at least 3" with intermittent UE support at counter without KAFO on RLE with SBA for incr. independence with ADL's.  (04-05-16)   Baseline 04/13/16: pt able to stand for >/= 3 minutes with intermittent support on walker with min guard assist to supervision   Status Achieved     PT SHORT TERM GOAL #3   Title Amb. 150' with LRAD with CGA on flat, even surface.  (Modified Target Date: 05/27/16)   Baseline Met 8/3.   Status Achieved     PT SHORT TERM GOAL #4   Title Perform bed mobility including sit to/from supine with S.  (Modified Target Date: 05/27/16)   Baseline 8/7: Mod I for bed mobility (without shoes or AFO)   Status Achieved     PT SHORT TERM GOAL #5   Title Independent in HEP for RLE strengthening.  (04-05-16)   Baseline reports compliance with bed level exercises 04/15/16   Time 4   Period Weeks   Status Achieved           PT Long Term Goals - 04/29/16 1848      PT LONG TERM GOAL #1   Title Modified independent with basic transfers.  Modified Target Date: 06/24/16)   Baseline Min/guard for stand pivot transfers with RW and HO 04/29/16   Time 8   Period Weeks   Status On-going     PT LONG TERM GOAL #2   Title Modified independent with bed mobility.  (Modified Target Date: 06/24/16)   Baseline Requires min A for safety of RLE 04/29/16   Time 8   Period Weeks   Status On-going     PT LONG TERM GOAL #3   Title Amb. 100' with LRAD with S for incr. household amb.  (Modified Target Date: 06/24/16)   Baseline Amb up to 36' w/ RW and R hand orthosis at min A level   Time 8   Period Weeks   Status On-going  and revised distance     PT LONG TERM GOAL #4   Title Amb. 81' with hemiwalker with CGA for incr. community accessibility.  (Modified Target Date: 06/24/16)   Time 8   Period Weeks   Status  On-going     PT LONG TERM GOAL #5   Title Negotiate steps (4) with S with use of hand rail .  (Modified Target Date: 06/24/16)   Time 8   Period Weeks   Status New     PT LONG TERM GOAL #6  Title Perform Berg balance test when appropriate and establish goal as needed.  (05-04-16)   Baseline Will likely not be appropriate due to fall risk without device.    Time 8   Period Weeks   Status Deferred               Plan - 14-Jul-2016 1703    Clinical Impression Statement Pt demonstrated progress, as he tolerated amb. with SBQC without LOB but continues to require cues to improve upright posture, incr. RLE stance time and stride length. Pt also required cues and demo for sequencing with SBQC. Pt continues to require seated rest breaks 2/2 fatigue.  pt would continue to benefit from skilled PT to improve safety during functional mobility.    Rehab Potential Good   PT Frequency 2x / week   PT Duration 8 weeks   PT Treatment/Interventions ADLs/Self Care Home Management;DME Instruction;Balance training;Therapeutic exercise;Therapeutic activities;Functional mobility training;Stair training;Gait training;Neuromuscular re-education;Patient/family education;Orthotic Fit/Training;Passive range of motion   PT Next Visit Plan Assess goals?.  G-Continue to work on Psychologist, counselling (with wife) prn, gait training with SBQC (with wife and begin to incorportate carpet and obstacles) and without device if safe for L NMR, postural control. Temporary ramp planned to be removed from home on 9/11, per wife (this is still correct). Trial gait outdoors per pt request, if safe.   Consulted and Agree with Plan of Care Patient;Family member/caregiver   Family Member Consulted spouse Dale Rinard      Patient will benefit from skilled therapeutic intervention in order to improve the following deficits and impairments:  Abnormal gait, Decreased activity tolerance, Decreased balance, Decreased coordination, Decreased endurance,  Decreased mobility, Decreased range of motion, Decreased strength, Impaired tone, Impaired UE functional use, Obesity  Visit Diagnosis: Hemiplegia and hemiparesis following cerebral infarction affecting right non-dominant side (HCC)  Unsteadiness on feet  Muscle weakness (generalized)  Other abnormalities of gait and mobility       G-Codes - 2016-07-14 1410    Functional Assessment Tool Used Clinical judgement:  Pt requires S with RW and R hand orthosis, S to MOD I level for transfers, S for bed mobility.     Functional Limitation Mobility: Walking and moving around   Mobility: Walking and Moving Around Current Status [phone]) At least 20 percent but less than 40 percent impaired, limited or restricted   Mobility: Walking and Moving Around Goal Status [phone]) At least 20 percent but less than 40 percent impaired, limited or restricted      Problem List Patient Active Problem List   Diagnosis Date Noted  . Slow transit constipation   . Neuropathic pain   . Stroke, acute, thrombotic (HCC) 10/08/2015  . 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 Essential hypertension 10/08/2015  . HLD (hyperlipidemia) 10/08/2015  . Hemiplegia and hemiparesis following cerebral infarction affecting right dominant side (HCC)   . Right hemiplegia (HCC) 10/06/2015  . Stroke (cerebrum) (HCC) 10/05/2015  . CVA (cerebral infarction) 10/05/2015    Hana Trippett L Jul 14, 2016, 5:07 PM   [facility] [address] [place], [place], [address] Phone: [phone]   Fax:  [phone]  Name: Sean Wilkerson MRN: [id number] Date of Birth: 1946/10/12  Physical Therapy Progress Note  Dates of Reporting Period: 05/13/16 to July 14, 2016  Objective Reports of Subjective Statement: Please see above. Pt is still limited by fatigue but reports he feels like he's getting better every  day.  Objective Measurements: Please  see above, pt requires less assist during all mobility.  Goal Update:      PT Short Term Goals - 05/27/16 1457      PT SHORT TERM GOAL #1   Title Pt will perform basic transfers with S only - wheelchair to/from mat.  (modified Target Date: 05/27/16)   Baseline met 05/27/16   Status Achieved     PT SHORT TERM GOAL #2   Title Stand for at least 3" with intermittent UE support at counter without KAFO on RLE with SBA for incr. independence with ADL's.  (04-05-16)   Baseline 04/13/16: pt able to stand for >/= 3 minutes with intermittent support on walker with min guard assist to supervision   Status Achieved     PT SHORT TERM GOAL #3   Title Amb. 150' with LRAD with CGA on flat, even surface.  (Modified Target Date: 05/27/16)   Baseline Met 8/3.   Status Achieved     PT SHORT TERM GOAL #4   Title Perform bed mobility including sit to/from supine with S.  (Modified Target Date: 05/27/16)   Baseline 8/7: Mod I for bed mobility (without shoes or AFO)   Status Achieved     PT SHORT TERM GOAL #5   Title Independent in HEP for RLE strengthening.  (04-05-16)   Baseline reports compliance with bed level exercises 04/15/16   Time 4   Period Weeks   Status Achieved          PT Long Term Goals - 04/29/16 1848      PT LONG TERM GOAL #1   Title Modified independent with basic transfers.  Modified Target Date: 06/24/16)   Baseline Min/guard for stand pivot transfers with RW and HO 04/29/16   Time 8   Period Weeks   Status On-going     PT LONG TERM GOAL #2   Title Modified independent with bed mobility.  (Modified Target Date: 06/24/16)   Baseline Requires min A for safety of RLE 04/29/16   Time 8   Period Weeks   Status On-going     PT LONG TERM GOAL #3   Title Amb. 100' with LRAD with S for incr. household amb.  (Modified Target Date: 06/24/16)   Baseline Amb up to 64' w/ RW and R hand orthosis at min A level   Time 8   Period Weeks   Status On-going   and revised distance     PT LONG TERM GOAL #4   Title Amb. 10' with hemiwalker with CGA for incr. community accessibility.  (Modified Target Date: 06/24/16)   Time 8   Period Weeks   Status On-going     PT LONG TERM GOAL #5   Title Negotiate steps (4) with S with use of hand rail .  (Modified Target Date: 06/24/16)   Time 8   Period Weeks   Status New     PT LONG TERM GOAL #6   Title Perform Berg balance test when appropriate and establish goal as needed.  (05-04-16)   Baseline Will likely not be appropriate due to fall risk without device.    Time 8   Period Weeks   Status Deferred       Plan: Continue to emphasis gait and strength training.  Reason Skilled Services are Required: To improve safety during functional mobility.     Geoffry Paradise, PT,DPT 06/22/16 5:07 PM Phone: [phone] Fax: [phone]

## 2016-06-22 NOTE — Therapy (Signed)
[facility] [address] [place], [place], [address] Phone: [phone]   Fax:  [phone]  Occupational Therapy Treatment  Patient Details  Name: Sean Wilkerson MRN: [id number] Date of Birth: 1946-04-22 Referring Provider: Dr. Pearlean Brownie  Encounter Date: 06/22/2016      OT End of Session - 06/22/16 1709    Visit Number 29   Number of Visits 32   Date for OT Re-Evaluation 06/24/16   Authorization Type [employer] MCR   Authorization Time Period g code and PN every 10th visit   Authorization - Visit Number 29   Authorization - Number of Visits 30   OT Start Time 1447   OT Stop Time 1530   OT Time Calculation (min) 43 min   Activity Tolerance Patient tolerated treatment well   Behavior During Therapy [facility] for tasks assessed/performed      Past Medical History:  Diagnosis Date  . HLD (hyperlipidemia) 10/08/2015  . HTN (hypertension) 10/08/2015  . Hypertension   . Stroke [facility])     Past Surgical History:  Procedure Laterality Date  . NO PAST SURGERIES      There were no vitals filed for this visit.      Subjective Assessment - 06/22/16 1703    Subjective  I want this arm to work   Pertinent History see Epic, L CVA   Patient Stated Goals to be more indpendent   Currently in Pain? No/denies   Pain Score 0-No pain                      OT Treatments/Exercises (OP) - 06/22/16 1703      Neurological Re-education Exercises   Other Exercises 1 Neuromuscular reeducation to address attention and activation in right arm.  In standing, patient had difficulty sustaining right LE activation. Backed down to sitting to address weight bearing through long right arm - working to incorporate control and activation of mid joint / elbow.  Patient showing improved ability to isolate true shoulder motion with weight biased toward right side, and not allowing overflow trunk activity.       Programme researcher, broadcasting/film/video Location elbow- right, bicep, then tricep   Electrical Stimulation Action elbow flexion, and attempted extension   Electrical Stimulation Parameters on/off 10 sec, ramp time2 sec, pulse width 250 x 10 min   Electrical Stimulation Goals Neuromuscular facilitation                OT Education - 06/22/16 1708    Education provided Yes   Education Details Discussed plan to recertify for four additional weeks to enhance further arm functioning, and increase independence with ADL/IADL   Person(s) Educated Patient   Methods Explanation;Demonstration;Tactile cues;Verbal cues   Comprehension Verbal cues required;Need further instruction          OT Short Term Goals - 06/17/16 1701      OT SHORT TERM GOAL #1   Title I with inital HEP.- check 04/03/16   Status Achieved     OT SHORT TERM GOAL #2   Title Pt will demonstrate understanding of RUE positioning to minimize pain and risk for injury including splint wear PRN.   Status Achieved     OT SHORT TERM GOAL #3   Title Pt will consistently donn shirt with min A.   Status Achieved     OT SHORT TERM GOAL #4   Title Pt will perform toilet transfers with min A.  Status Achieved     OT SHORT TERM GOAL #5   Title Pt will donn pants with min A using AE PRN.   Status Achieved     OT SHORT TERM GOAL #6   Title Pt will  perform shower transfer and bathe with min A.- 05/27/2016   Status Achieved     OT SHORT TERM GOAL #7   Title Pt will tolerate P/ROM shoulder flexion to 90* with no significant increase in pain.   Status Achieved           OT Long Term Goals - 06/17/16 1701      OT LONG TERM GOAL #1   Title Pt will donn shirt with set up only.- check 06/24/2016   Status Achieved     OT LONG TERM GOAL #2   Title Pt will donn pants with minguard for balance.   Status Achieved     OT LONG TERM GOAL #3   Title Pt will donn socks and shoes with min A.   Status Achieved  Pt is able to don/doff L sock  and shoes as well as don/doff R sock and doff R shoe. Pt also able to use shoe button to hook and unhook shoes laces.  Pt needs min a to don R shoe with brace.      OT LONG TERM GOAL #4   Title Pt will use RUE as a stabilizer for ADLS at least 25 % of the time with pain less than or equal to 3/10.   Status On-going     OT LONG TERM GOAL #5   Title Pt will demonstrate P/ROM shoulder flexion to 100* with pain no greater than 3/10.   Status Achieved     OT LONG TERM GOAL #6   Title Pt will demonstrate ability to stand at countertop and retrieve an item from midlevel shelf with LUE, with supervision and no LOB.   Status Achieved               Plan - 06/22/16 1710    Clinical Impression Statement Patient with increased active movement in R shoulder following set up and facilitation, but limited carryover yet to functional application.     Rehab Potential Good   Clinical Impairments Affecting Rehab Potential Potential for depression- will monitor and offer support, resources as warranted; severity of deficits   OT Frequency 2x / week   OT Duration 8 weeks   OT Treatment/Interventions Self-care/ADL training;Therapeutic exercise;Patient/family education;Balance training;Splinting;Neuromuscular education;Ultrasound;Energy conservation;Therapeutic exercises;Therapeutic activities;DME and/or AE instruction;Parrafin;Cryotherapy;Electrical Stimulation;Fluidtherapy;Cognitive remediation/compensation;Visual/perceptual remediation/compensation;Passive range of motion;Contrast Bath;Moist Heat   Plan NMR RUE, assess long term goals, recertify for 4 additional weeks with emphasis on functional use of right arm   Consulted and Agree with Plan of Care Patient      Patient will benefit from skilled therapeutic intervention in order to improve the following deficits and impairments:  Decreased coordination, Decreased range of motion, Increased edema, Decreased safety awareness, Decreased endurance,  Decreased activity tolerance, Decreased knowledge of precautions, Impaired tone, Obesity, Pain, Impaired UE functional use, Decreased knowledge of use of DME, Decreased balance, Decreased cognition, Decreased mobility, Decreased strength, Impaired perceived functional ability, Impaired vision/preception  Visit Diagnosis: Hemiplegia and hemiparesis following cerebral infarction affecting right non-dominant side (HCC)  Unsteadiness on feet  Muscle weakness (generalized)  Other lack of coordination    Problem List Patient Active Problem List   Diagnosis Date Noted  . Slow transit constipation   . Neuropathic pain   . Stroke,  acute, thrombotic (HCC) 10/08/2015  . 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 Essential hypertension 10/08/2015  . HLD (hyperlipidemia) 10/08/2015  . Hemiplegia and hemiparesis following cerebral infarction affecting right dominant side (HCC)   . Right hemiplegia (HCC) 10/06/2015  . Stroke (cerebrum) (HCC) 10/05/2015  . CVA (cerebral infarction) 10/05/2015    Collier Salina 06/22/2016, 5:12 PM  [facility] [address] [place], [place], [address] Phone: [phone]   Fax:  [phone]  Name: Rayaan Garguilo MRN: [id number] Date of Birth: 1946/05/02

## 2016-06-24 ENCOUNTER — Ambulatory Visit: Payer: Medicare Other | Admitting: Rehabilitation

## 2016-06-24 ENCOUNTER — Encounter: Payer: Medicare Other | Admitting: Occupational Therapy

## 2016-06-25 ENCOUNTER — Ambulatory Visit: Payer: Medicare Other | Admitting: Occupational Therapy

## 2016-06-25 ENCOUNTER — Ambulatory Visit: Payer: Medicare Other | Admitting: Rehabilitation

## 2016-06-25 ENCOUNTER — Encounter: Payer: Self-pay | Admitting: Rehabilitation

## 2016-06-25 ENCOUNTER — Encounter: Payer: Self-pay | Admitting: Occupational Therapy

## 2016-06-25 DIAGNOSIS — R2681 Unsteadiness on feet: Secondary | ICD-10-CM

## 2016-06-25 DIAGNOSIS — I69353 Hemiplegia and hemiparesis following cerebral infarction affecting right non-dominant side: Secondary | ICD-10-CM

## 2016-06-25 DIAGNOSIS — M6281 Muscle weakness (generalized): Secondary | ICD-10-CM

## 2016-06-25 DIAGNOSIS — R278 Other lack of coordination: Secondary | ICD-10-CM

## 2016-06-25 NOTE — Therapy (Signed)
[facility] [address] [place], [place], [address] Phone: [phone]   Fax:  [phone]  Occupational Therapy Treatment  Patient Details  Name: Sean Wilkerson MRN: [id number] Date of Birth: 12-Nov-1945 Referring Provider: Dr. Pearlean Brownie  Encounter Date: 06/25/2016      OT End of Session - 06/25/16 1558    Visit Number 30   Number of Visits 32   Date for OT Re-Evaluation 06/24/16   Authorization Type UMR MCR   Authorization Time Period g code and PN every 10th visit   Authorization - Visit Number 30   Authorization - Number of Visits 32   OT Start Time 1402   OT Stop Time 1446   OT Time Calculation (min) 44 min   Activity Tolerance Patient tolerated treatment well   Behavior During Therapy [facility] for tasks assessed/performed      Past Medical History:  Diagnosis Date  . HLD (hyperlipidemia) 10/08/2015  . HTN (hypertension) 10/08/2015  . Hypertension   . Stroke [facility])     Past Surgical History:  Procedure Laterality Date  . NO PAST SURGERIES      There were no vitals filed for this visit.      Subjective Assessment - 06/25/16 1551    Subjective  Is there a medication for this (arm function)   Pertinent History see Epic, L CVA   Patient Stated Goals to be more indpendent   Currently in Pain? No/denies   Pain Score 0-No pain                      OT Treatments/Exercises (OP) - 06/25/16 0001      ADLs   Eating Patient has eaten mostly soft food since his stroke, and this he can cut withthe side of his fork, yet has not been able to cut meat with a knife.  Discussed and demonstrated rocker knife options.  Patient felt these may be nice options to increase independence with eating.    Grooming Patient is not yet able to complete grooming tasks such as denture care.  Discussed suction denture brush to increase independence in ADL.  Gave patient reference as to grooming tools that may be  effective.    Toileting Patient continues to use [employer] for bathroom transfers.  Wife not present for this session.  Discussed with patient the need to discontinue Stedy lift.      ADL Comments Discussion to prepare patient for ultimate OT discharge.  Patient very anxious about ending therapy. Patient able to verbalize that the environment here is good for him.  Encouraged patient to consider volunteering in the hospital, or attending stroke support group.  Resources given to patient for both.     Neurological Re-education Exercises   Other Exercises 1 Neuromuscular reeducation to address activation of right upper extremity.  Patient needs to strong bias toward right hip in sitting, and right foot in standign to produce any sustained movement.                  OT Education - 06/25/16 1558    Education provided Yes   Education Details adaptive equipment for feeding, and grooming   Person(s) Educated Patient   Methods Explanation;Demonstration;Verbal cues;Tactile cues;Handout   Comprehension Verbalized understanding;Returned demonstration;Verbal cues required;Need further instruction          OT Short Term Goals - 06/17/16 1701      OT SHORT TERM GOAL #1   Title I  with inital HEP.- check 04/03/16   Status Achieved     OT SHORT TERM GOAL #2   Title Pt will demonstrate understanding of RUE positioning to minimize pain and risk for injury including splint wear PRN.   Status Achieved     OT SHORT TERM GOAL #3   Title Pt will consistently donn shirt with min A.   Status Achieved     OT SHORT TERM GOAL #4   Title Pt will perform toilet transfers with min A.   Status Achieved     OT SHORT TERM GOAL #5   Title Pt will donn pants with min A using AE PRN.   Status Achieved     OT SHORT TERM GOAL #6   Title Pt will  perform shower transfer and bathe with min A.- 05/27/2016   Status Achieved     OT SHORT TERM GOAL #7   Title Pt will tolerate P/ROM shoulder flexion to 90*  with no significant increase in pain.   Status Achieved           OT Long Term Goals - 06/25/16 1602      OT LONG TERM GOAL #1   Title Pt will donn shirt with set up only.- check 06/24/2016   Status Achieved     OT LONG TERM GOAL #2   Title Pt will donn pants with minguard for balance.   Status Achieved     OT LONG TERM GOAL #3   Title Pt will donn socks and shoes with min A.   Status Achieved     OT LONG TERM GOAL #4   Title Pt will use RUE as a stabilizer for ADLS at least 25 % of the time with pain less than or equal to 3/10.   Status On-going     OT LONG TERM GOAL #5   Title Pt will demonstrate P/ROM shoulder flexion to 100* with pain no greater than 3/10.   Status Achieved     OT LONG TERM GOAL #6   Title Pt will demonstrate ability to stand at countertop and retrieve an item from midlevel shelf with LUE, with supervision and no LOB.   Status Achieved             Patient will benefit from skilled therapeutic intervention in order to improve the following deficits and impairments:     Visit Diagnosis: Hemiplegia and hemiparesis following cerebral infarction affecting right non-dominant side (HCC)  Unsteadiness on feet  Muscle weakness (generalized)  Other lack of coordination      G-Codes - 06/25/16 1602    Functional Assessment Tool Used clinical impresssions /ADL status   Functional Limitation Self care   Self Care Current Status (Z6109(G8987) At least 40 percent but less than 60 percent impaired, limited or restricted   Self Care Goal Status (U0454(G8988) At least 20 percent but less than 40 percent impaired, limited or restricted      Problem List Patient Active Problem List   Diagnosis Date Noted  . Slow transit constipation   . Neuropathic pain   . Stroke, acute, thrombotic (HCC) 10/08/2015  . 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 Essential hypertension 10/08/2015  . HLD  (hyperlipidemia) 10/08/2015  . Hemiplegia and hemiparesis following cerebral infarction affecting right dominant side (HCC)   . Right hemiplegia (HCC) 10/06/2015  . Stroke (cerebrum) (HCC) 10/05/2015  . CVA (cerebral infarction) 10/05/2015   Occupational Therapy Progress Note  Dates of Reporting Period: 05/20/16 to 06/25/16  Objective Measurements:  Pain resolved in right UE, decreased assistance with ADL  Goal Update: See above  Plan: Continue OT plan of care  Reason Skilled Services are Required: To address remaining OT goals  Collier Salina, OTR/L 06/25/2016, 4:05 PM  [facility] [address] [place], [place], [address] Phone: [phone]   Fax:  [phone]  Name: Tiron Suski MRN: [id number] Date of Birth: [birth year]

## 2016-06-25 NOTE — Therapy (Signed)
[facility] [address] [place], [place], [address] Phone: [phone]   Fax:  [phone]  Physical Therapy Treatment  Patient Details  Name: Sean Wilkerson MRN: [id number] Date of Birth: Mar 22, 1946 Referring Provider: Dr. Delia Heady  Encounter Date: 06/25/2016      PT End of Session - 06/25/16 1328    Visit Number 31   Number of Visits 33   Date for PT Re-Evaluation 06/28/16   Authorization Type UHC Medicare   Authorization Time Period 04/29/16-06/28/16   PT Start Time 1317   PT Stop Time 1403   PT Time Calculation (min) 46 min   Equipment Utilized During Treatment Gait belt   Activity Tolerance Patient tolerated treatment well;Patient limited by fatigue   Behavior During Therapy [facility] for tasks assessed/performed      Past Medical History:  Diagnosis Date  . HLD (hyperlipidemia) 10/08/2015  . HTN (hypertension) 10/08/2015  . Hypertension   . Stroke [facility])     Past Surgical History:  Procedure Laterality Date  . NO PAST SURGERIES      There were no vitals filed for this visit.      Subjective Assessment - 06/25/16 1632    Subjective "Are you giving up on me."  (in response to discussion regarding renewal for another 4 weeks and then taking break from therapy.     Pertinent History L CVA 10-05-15 - hospitalized until Jan - D/C'd home with Home Health PT, OT ST - Home health finished up on 02-25-16   Patient Stated Goals "I want to walk without anything"   Currently in Pain? No/denies            NMR:  While in standing had pt place LLE on large 7" step in order to maintain stance on RLE while performing UE task.  Note marked difficulty, therefore deferred UE task and had pt work on holding R stance for 15 secs.  Performed x 3 reps.   Note marked compensation with R trunk rotation and increased weight shift to the L, therefore decreased size of step to 3" and was able to continue with UE task,  reaching to the R with LUE for clothes pin and placing to the L on chair.  Performed single task at a time to increase sit<>stand reps for increased focus on midline x 5 reps with cues for placing LUE on R leg when standing for increased R weight shift.  Provided facilitation at RLE to prevent buckle, at pelvis for increased R hip protraction and at axilla/rib cage to maintain upright posture during task.  Progressed to performing several bouts of reaching ending with lateral side to side reaching while in mini squat position.    Self Care:  Had lengthy discussion with pt regarding renewal for 4 more weeks of therapy and then allowing pt to work at home independently before returning to PT again.  Educated on why this is important.  Pt verbalized understanding, however feel that he will need continued education (as well as speaking with wife) regarding matter.  Also educated on community outing.  Pt has not been out to eat since CVA, therefore educated on how to perform safely and visiting somewhere familiar.  Pt verbalized understanding.                      PT Education - 06/25/16 1632    Education provided Yes   Education Details education on renewal for therapy and then  taking break to work at home, education on community negotiation.    Person(s) Educated Patient   Methods Explanation   Comprehension Verbalized understanding          PT Short Term Goals - 05/27/16 1457      PT SHORT TERM GOAL #1   Title Pt will perform basic transfers with S only - wheelchair to/from mat.  (modified [place] Date: 05/27/16)   Baseline met 05/27/16   Status Achieved     PT SHORT TERM GOAL #2   Title Stand for at least 3" with intermittent UE support at counter without KAFO on RLE with SBA for incr. independence with ADL's.  (04-05-16)   Baseline 04/13/16: pt able to stand for >/= 3 minutes with intermittent support on walker with min guard assist to supervision   Status Achieved     PT SHORT  TERM GOAL #3   Title Amb. 150' with LRAD with CGA on flat, even surface.  (Modified Target Date: 05/27/16)   Baseline Met 8/3.   Status Achieved     PT SHORT TERM GOAL #4   Title Perform bed mobility including sit to/from supine with S.  (Modified Target Date: 05/27/16)   Baseline 8/7: Mod I for bed mobility (without shoes or AFO)   Status Achieved     PT SHORT TERM GOAL #5   Title Independent in HEP for RLE strengthening.  (04-05-16)   Baseline reports compliance with bed level exercises 04/15/16   Time 4   Period Weeks   Status Achieved           PT Long Term Goals - 06/25/16 1329      PT LONG TERM GOAL #1   Title Modified independent with basic transfers.  Modified Target Date: 06/24/16)   Baseline met 06/25/16 with RW, R HO and RAFO   Time 8   Period Weeks   Status Achieved     PT LONG TERM GOAL #2   Title Modified independent with bed mobility.  (Modified Target Date: 06/24/16)   Baseline met 06/25/16 (states is doing at home by himself without brace)   Time 8   Period Weeks   Status Achieved     PT LONG TERM GOAL #3   Title Amb. 100' with LRAD with S for incr. household amb.  (Modified Target Date: 06/24/16)   Baseline met 06/25/16 at S level with RW, R HO, R AFO   Time 8   Period Weeks   Status Achieved  and revised distance     PT LONG TERM GOAL #4   Title Amb. 44' with hemiwalker with CGA for incr. community accessibility.  (Modified Target Date: 06/24/16)   Time 8   Period Weeks   Status On-going     PT LONG TERM GOAL #5   Title Negotiate steps (4) with S with use of hand rail .  (Modified Target Date: 06/24/16)   Baseline min A with stairs per last session    Time 8   Period Weeks   Status Not Met     PT LONG TERM GOAL #6   Title Perform Berg balance test when appropriate and establish goal as needed.  (05-04-16)   Baseline Will likely not be appropriate due to fall risk without device.    Time 8   Period Weeks   Status Deferred               Plan -  06/25/16 1634    Clinical Impression  Statement Skilled session focused on discussion regarding renewal for 4 more weeks of PT/OT and then allowing pt to work at home.  Discussion of community negotiation, and NMR in standing for RLE.     Rehab Potential Good   PT Frequency 2x / week   PT Duration 8 weeks   PT Treatment/Interventions ADLs/Self Care Home Management;DME Instruction;Balance training;Therapeutic exercise;Therapeutic activities;Functional mobility training;Stair training;Gait training;Neuromuscular re-education;Patient/family education;Orthotic Fit/Training;Passive range of motion   PT Next Visit Plan Do re-cert and add goals.  G-Continue to work on Industrial/product designer (with wife) prn, gait training with [facility] (with wife and begin to incorportate carpet and obstacles) and without device if safe for L NMR, postural control. Temporary ramp planned to be removed from home on 9/11, per wife (this is still correct). Trial gait outdoors per pt request, if safe.   Consulted and Agree with Plan of Care Patient;Family member/caregiver   Family Member Consulted spouse [name]      Patient will benefit from skilled therapeutic intervention in order to improve the following deficits and impairments:  Abnormal gait, Decreased activity tolerance, Decreased balance, Decreased coordination, Decreased endurance, Decreased mobility, Decreased range of motion, Decreased strength, Impaired tone, Impaired UE functional use, Obesity  Visit Diagnosis: Hemiplegia and hemiparesis following cerebral infarction affecting right non-dominant side (HCC)  Unsteadiness on feet  Muscle weakness (generalized)     Problem List Patient Active Problem List   Diagnosis Date Noted  . Slow transit constipation   . Neuropathic pain   . Stroke, acute, thrombotic ([place]) 10/08/2015  . Hemiparesis, aphasia, and dysphagia as late effect of cerebrovascular accident (CVA) ([place]) 10/08/2015  . Essential hypertension 10/08/2015  .  HLD (hyperlipidemia) 10/08/2015  . Hemiplegia and hemiparesis following cerebral infarction affecting right dominant side ([place])   . Right hemiplegia ([place]) 10/06/2015  . Stroke (cerebrum) ([place]) 10/05/2015  . CVA (cerebral infarction) 10/05/2015    Cameron Sprang, PT, MPT [facility] [address] [place], [place], [address] Phone: [phone]   Fax:  [phone] 06/25/16, 4:37 PM  Name: Remi Lopata MRN: [id number] Date of Birth: [birth year]

## 2016-06-28 ENCOUNTER — Encounter: Payer: Self-pay | Admitting: Physical Therapy

## 2016-06-28 ENCOUNTER — Encounter: Payer: Self-pay | Admitting: Occupational Therapy

## 2016-06-28 ENCOUNTER — Ambulatory Visit: Payer: Medicare Other | Admitting: Occupational Therapy

## 2016-06-28 ENCOUNTER — Ambulatory Visit: Payer: Medicare Other | Admitting: Physical Therapy

## 2016-06-28 DIAGNOSIS — R278 Other lack of coordination: Secondary | ICD-10-CM

## 2016-06-28 DIAGNOSIS — R2689 Other abnormalities of gait and mobility: Secondary | ICD-10-CM

## 2016-06-28 DIAGNOSIS — I69353 Hemiplegia and hemiparesis following cerebral infarction affecting right non-dominant side: Secondary | ICD-10-CM

## 2016-06-28 DIAGNOSIS — R2681 Unsteadiness on feet: Secondary | ICD-10-CM

## 2016-06-28 DIAGNOSIS — M6281 Muscle weakness (generalized): Secondary | ICD-10-CM

## 2016-06-28 NOTE — Therapy (Signed)
[facility] [address] [place], [place], [address] Phone: [phone]   Fax:  [phone]  Occupational Therapy Treatment  Patient Details  Name: Sean Wilkerson MRN: [id number] Date of Birth: 1946-04-13 Referring Provider: Dr. Pearlean BrownieSethi  Encounter Date: 06/28/2016      OT End of Session - 06/28/16 1650    Visit Number 31   Number of Visits 32   Date for OT Re-Evaluation 06/24/16   Authorization Type UMR MCR   Authorization Time Period g code and PN every 10th visit   Authorization - Visit Number 31   Authorization - Number of Visits 32   OT Start Time 1320   OT Stop Time 1400   OT Time Calculation (min) 40 min   Activity Tolerance Patient tolerated treatment well   Behavior During Therapy [facility] for tasks assessed/performed      Past Medical History:  Diagnosis Date  . HLD (hyperlipidemia) 10/08/2015  . HTN (hypertension) 10/08/2015  . Hypertension   . Stroke [facility](HCC)     Past Surgical History:  Procedure Laterality Date  . NO PAST SURGERIES      There were no vitals filed for this visit.      Subjective Assessment - 06/28/16 1644    Subjective  What went out of my arm?   Pertinent History see Epic, L CVA   Patient Stated Goals to be more indpendent   Currently in Pain? No/denies   Pain Score 0-No pain                      OT Treatments/Exercises (OP) - 06/28/16 0001      Neurological Re-education Exercises   Other Exercises 1 Neuromuscular reeducation to address active attention to right UE. Utilized forced use concept to increase active movement and attemots at functional use of right arm.  Patient with brief bursts of motion.  Patient easily frustrated and continues to state that he wants his arm to work.  Patient does not seem able to make the connection to active movement leading to functional use.  Patient with limited awareness, or ability to reason.  Patient also with nearly  absent sensation - so difficult to feel active movement.  Increased tension noted in right hand today.  Patient continues to wear brace.  Patient was able to isolate pronation today without compensatory trunk and head motions.                  OT Education - 06/28/16 1649    Education provided Yes   Education Details Importance of active attempts to use right UE   Person(s) Educated Patient   Methods Explanation;Tactile cues;Verbal cues   Comprehension Verbalized understanding;Need further instruction;Verbal cues required          OT Short Term Goals - 06/28/16 1652      OT SHORT TERM GOAL #1   Title I with inital HEP.- check 04/03/16   Time 4   Period Weeks   Status Achieved     OT SHORT TERM GOAL #2   Title Pt will demonstrate understanding of RUE positioning to minimize pain and risk for injury including splint wear PRN.   Time 4   Period Weeks   Status Achieved     OT SHORT TERM GOAL #3   Title Pt will consistently donn shirt with min A.   Baseline min-mod A   Time 4   Period Weeks   Status Achieved  OT SHORT TERM GOAL #4   Title Pt will perform toilet transfers with min A.   Baseline wife is currently using standing lift, pt has walker with platform   Time 4   Period Weeks   Status On-going     OT SHORT TERM GOAL #5   Title Pt will donn pants with min A using AE PRN.   Baseline mod for pants, dependent for shoes and socks   Time 4   Period Weeks   Status Achieved     OT SHORT TERM GOAL #6   Title Pt will  perform shower transfer and bathe with min A.- 05/27/2016   Period Weeks   Status Achieved           OT Long Term Goals - 06/28/16 1653      OT LONG TERM GOAL #1   Title Pt will donn shirt with set up only.- check 06/24/2016   Time 8   Period Weeks   Status Achieved     OT LONG TERM GOAL #2   Title Pt will donn pants with minguard for balance.   Time 8   Period Weeks   Status Achieved     OT LONG TERM GOAL #3   Title Pt will donn  socks and shoes with min A.   Time 8   Period Weeks   Status On-going     OT LONG TERM GOAL #4   Title Pt will use RUE as a stabilizer for ADLS at least 25 % of the time with pain less than or equal to 3/10.   Time 8   Period Weeks   Status On-going     OT LONG TERM GOAL #5   Title Pt will demonstrate P/ROM shoulder flexion to 100* with pain no greater than 3/10.   Time 8   Period Weeks   Status Achieved     OT LONG TERM GOAL #6   Title Pt will demonstrate ability to stand at countertop and retrieve an item from midlevel shelf with LUE, with supervision and no LOB.   Time 8   Period Weeks   Status Achieved               Plan - 06/28/16 1650    Clinical Impression Statement Patient with slow improvement in movement of right UE, yet making functional improvement with ADL and functional mobility.     Rehab Potential Good   Clinical Impairments Affecting Rehab Potential Potential for depression- will monitor and offer support, resources as warranted; severity of deficits   OT Frequency 2x / week   OT Duration 8 weeks   OT Treatment/Interventions Self-care/ADL training;Therapeutic exercise;Patient/family education;Balance training;Splinting;Neuromuscular education;Ultrasound;Energy conservation;Therapeutic exercises;Therapeutic activities;DME and/or AE instruction;Parrafin;Cryotherapy;Electrical Stimulation;Fluidtherapy;Cognitive remediation/compensation;Visual/perceptual remediation/compensation;Passive range of motion;Contrast Bath;Moist Heat   Plan NMR RUE, IADL -up on feet    Consulted and Agree with Plan of Care Patient      Patient will benefit from skilled therapeutic intervention in order to improve the following deficits and impairments:  Decreased coordination, Decreased range of motion, Increased edema, Decreased safety awareness, Decreased endurance, Decreased activity tolerance, Decreased knowledge of precautions, Impaired tone, Obesity, Pain, Impaired UE  functional use, Decreased knowledge of use of DME, Decreased balance, Decreased cognition, Decreased mobility, Decreased strength, Impaired perceived functional ability, Impaired vision/preception  Visit Diagnosis: Unsteadiness on feet - Plan: Ot plan of care cert/re-cert  Hemiplegia and hemiparesis following cerebral infarction affecting right non-dominant side (HCC) - Plan: Ot plan of care cert/re-cert  Other lack of coordination - Plan: Ot plan of care cert/re-cert  Muscle weakness (generalized) - Plan: Ot plan of care cert/re-cert    Problem List Patient Active Problem List   Diagnosis Date Noted  . Slow transit constipation   . Neuropathic pain   . Stroke, acute, thrombotic (HCC) 10/08/2015  . 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 Essential hypertension 10/08/2015  . HLD (hyperlipidemia) 10/08/2015  . Hemiplegia and hemiparesis following cerebral infarction affecting right dominant side (HCC)   . Right hemiplegia (HCC) 10/06/2015  . Stroke (cerebrum) (HCC) 10/05/2015  . CVA (cerebral infarction) 10/05/2015    Collier Salina, OTR/L 06/28/2016, 4:58 PM   [facility] [address] [place], [place], [address] Phone: [phone]   Fax:  [phone]  Name: Sean Wilkerson MRN: [id number] Date of Birth: 1946/02/04

## 2016-06-28 NOTE — Therapy (Signed)
[facility] [address] [place], [place], [address] Phone: [phone]   Fax:  [phone]  Physical Therapy Treatment  Patient Details  Name: Sean Wilkerson MRN: [id number] Date of Birth: [birth year] Referring Provider: Dr. Delia Heady  Encounter Date: 06/28/2016      PT End of Session - 06/28/16 1449    Visit Number 32   Number of Visits 33   Date for PT Re-Evaluation 06/28/16   PT Start Time 1400   PT Stop Time 1445   PT Time Calculation (min) 45 min   Equipment Utilized During Treatment Gait belt;Other (comment)  SBQC and RW   Activity Tolerance Patient tolerated treatment well;Patient limited by fatigue   Behavior During Therapy [facility] for tasks assessed/performed      Past Medical History:  Diagnosis Date  . HLD (hyperlipidemia) 10/08/2015  . HTN (hypertension) 10/08/2015  . Hypertension   . Stroke [facility])     Past Surgical History:  Procedure Laterality Date  . NO PAST SURGERIES      There were no vitals filed for this visit.      Subjective Assessment - 06/28/16 1402    Subjective Pt desires to go out to eat at a restaurant with his wife but states that "it takes my energy away".  He states that he feels some sense of "shame" when out in public but still goes to church weekly.     Patient is accompained by: Family member  Sean Wilkerson-wife   Pertinent History L CVA 10-05-15 - hospitalized until Jan - D/C'd home with Home Health PT, OT ST - Home health finished up on 02-25-16   Patient Stated Goals "I want to walk without anything"   Currently in Pain? No/denies   Pain Onset Today      Gait:  Pt ambulated 154ft from inside to outdoor paved surfaces with [facility] with CGA with verbal and tactile cues for proper weight shift and posture.Pt negotiated ramp outdoors with CGA & curb with minA with balance loss descending requiring modA for stabilization. He experienced one episode of knee buckling in which  he was able to catch himself with no additional assistance from SPT. Pt reported activity not being as difficult as he imagined it would be although was notably fatigued after activity requiring a seated rest break. Pt ambulated 40' in parallel bars backwards walking with minA initially progressing to CGA to incr dynamic balance and tactile cues to facilitate pt to weight shift on the R LE.  Pt required verbal cuing and demonstration to complete activity with noted improvement as he continued.  Pt reported the activity feeling "weird" but was able to complete with no problems. Patient ambulated 67' at end of session with [facility] with CGA with tactile cues on right wt shift & posture. Pt was SOB at end of session.   Neuromuscular Re-ed:  Pt performed step ups forward and lateral on 4" step with to incr strength in B LE and encourage activation and coordination of R LE musculature. Forward 3 reps leading with RLE ascend with modA & descend with minA. 3 reps leading with LLE ascending with minA & descending with modA. Laterally 3 reps to Rt & 3 reps to Lt required modA initially to step RLE in abducted position progressed to minA except stepping up with RLE in abducted position. Pt needed verbal and tactile cuing along with demonstration to complete task with pt returning demonstration correctly.  He was also cued to make sure  to step far enough to the side with the R LE during lat step ups to leave enough room for the L LE to step down/up.  Pt was visible fatigued and required multiple seated rest breaks during activity.   Four square stepping with two gait belts (flat surface flat to floor) on the floor 2 reps clockwise & counterclockwise to incr B LE coordination and improve stepping pattern to allow pt to safely navigate obstacle and incr dynamic balance.  Pt required CGA - min A with one instance in which he lost his balance that required mod A.  He also required cuing for sequencing and demonstration to  encourage him to correctly complete task.  Pt was notably fatigued by the end of treatment which possibly had an effect on performance towards the end of session.                          PT Education - 06/28/16 1448    Education provided Yes   Education Details Pt educated on energy conservation techniques when going out to eat with suggestions to eat at places that are "sit down" restaurants instead of buffets.  Spouse educated on proper set up for [facility] once ordered and delivered.   Person(s) Educated Patient   Methods Explanation   Comprehension Verbalized understanding          PT Short Term Goals - 05/27/16 1457      PT SHORT TERM GOAL #1   Title Pt will perform basic transfers with S only - wheelchair to/from mat.  (modified Target Date: 05/27/16)   Baseline met 05/27/16   Status Achieved     PT SHORT TERM GOAL #2   Title Stand for at least 3" with intermittent UE support at counter without KAFO on RLE with SBA for incr. independence with ADL's.  (04-05-16)   Baseline 04/13/16: pt able to stand for >/= 3 minutes with intermittent support on walker with min guard assist to supervision   Status Achieved     PT SHORT TERM GOAL #3   Title Amb. 150' with LRAD with CGA on flat, even surface.  (Modified Target Date: 05/27/16)   Baseline Met 8/3.   Status Achieved     PT SHORT TERM GOAL #4   Title Perform bed mobility including sit to/from supine with S.  (Modified Target Date: 05/27/16)   Baseline 8/7: Mod I for bed mobility (without shoes or AFO)   Status Achieved     PT SHORT TERM GOAL #5   Title Independent in HEP for RLE strengthening.  (04-05-16)   Baseline reports compliance with bed level exercises 04/15/16   Time 4   Period Weeks   Status Achieved           PT Long Term Goals - 06/25/16 1329      PT LONG TERM GOAL #1   Title Modified independent with basic transfers.  Modified Target Date: 06/24/16)   Baseline met 06/25/16 with RW, R HO and RAFO    Time 8   Period Weeks   Status Achieved     PT LONG TERM GOAL #2   Title Modified independent with bed mobility.  (Modified Target Date: 06/24/16)   Baseline met 06/25/16 (states is doing at home by himself without brace)   Time 8   Period Weeks   Status Achieved     PT LONG TERM GOAL #3   Title Amb. 100' with LRAD with  S for incr. household amb.  (Modified Target Date: 06/24/16)   Baseline met 06/25/16 at S level with RW, R HO, R AFO   Time 8   Period Weeks   Status Achieved  and revised distance     PT LONG TERM GOAL #4   Title Amb. 38' with hemiwalker with CGA for incr. community accessibility.  (Modified Target Date: 06/24/16)   Time 8   Period Weeks   Status On-going     PT LONG TERM GOAL #5   Title Negotiate steps (4) with S with use of hand rail .  (Modified Target Date: 06/24/16)   Baseline min A with stairs per last session    Time 8   Period Weeks   Status Not Met     PT LONG TERM GOAL #6   Title Perform Berg balance test when appropriate and establish goal as needed.  (05-04-16)   Baseline Will likely not be appropriate due to fall risk without device.    Time 8   Period Weeks   Status Deferred               Plan - 06/28/16 1451    Clinical Impression Statement Pt tolerated treatment well today other than needing frquent rest breaks due to fatigue.  He demonstrated the ability to ambulate outdoors with [facility] and negotiated one curb to address his desire to go out to eat with his wife.  Pt verbalizes some emotional and psychological stresses resulting from his stroke alog with a lack of confidence that prevents him from going out in public and participating in things he used to do before the stroke.  He showed improvement within the session in his balance and stepping ability indicating a need for further work to progress.  Pt is limited at this time in ambulation, dynamic balance, and strength thall should be addressed by skilled PT.   Rehab Potential Good   PT  Frequency 2x / week   PT Duration 8 weeks   PT Treatment/Interventions ADLs/Self Care Home Management;DME Instruction;Balance training;Therapeutic exercise;Therapeutic activities;Functional mobility training;Stair training;Gait training;Neuromuscular re-education;Patient/family education;Orthotic Fit/Training;Passive range of motion   PT Next Visit Plan Do re-cert and add goals.  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 Temporary ramp planned to be removed from home on 9/11, per wife (this is still correct). Trial gait outdoors per pt request, if safe.   PT Home Exercise Plan RLE strengthening   Consulted and Agree with Plan of Care Patient;Family member/caregiver   Family Member Consulted spouse Sean Wilkerson      Patient will benefit from skilled therapeutic intervention in order to improve the following deficits and impairments:  Abnormal gait, Decreased activity tolerance, Decreased balance, Decreased coordination, Decreased endurance, Decreased mobility, Decreased range of motion, Decreased strength, Impaired tone, Impaired UE functional use, Obesity  Visit Diagnosis: Unsteadiness on feet  Hemiplegia and hemiparesis following cerebral infarction affecting right non-dominant side (HCC)  Other lack of coordination  Other abnormalities of gait and mobility     Problem List Patient Active Problem List   Diagnosis Date Noted  . Slow transit constipation   . Neuropathic pain   . Stroke, acute, thrombotic ([place]) 10/08/2015  . Hemiparesis, aphasia, and dysphagia as late effect of cerebrovascular accident (CVA) ([place]) 10/08/2015  . Essential hypertension 10/08/2015  . HLD (hyperlipidemia) 10/08/2015  . Hemiplegia and  hemiparesis following cerebral infarction affecting right dominant side ([place])   . Right hemiplegia ([place]) 10/06/2015  . Stroke  (cerebrum) ([place]) 10/05/2015  . CVA (cerebral infarction) 10/05/2015    Sean Wilkerson, SPT 06/28/2016, 3:00 PM   Jamey Reas, PT, DPT PT Specializing in [place] 06/28/16 5:33 PM Phone:  [phone]  Fax:  [phone] [place], [place] [address]   [facility] [address] [place], [place], [address] Phone: [phone]   Fax:  [phone]  Name: Helix Lafontaine MRN: [id number] Date of Birth: 11/10/1945

## 2016-07-02 ENCOUNTER — Encounter: Payer: Self-pay | Admitting: Physical Therapy

## 2016-07-02 ENCOUNTER — Encounter: Payer: Self-pay | Admitting: Occupational Therapy

## 2016-07-02 ENCOUNTER — Ambulatory Visit: Payer: Medicare Other | Admitting: Occupational Therapy

## 2016-07-02 ENCOUNTER — Ambulatory Visit: Payer: Medicare Other | Admitting: Physical Therapy

## 2016-07-02 DIAGNOSIS — I69353 Hemiplegia and hemiparesis following cerebral infarction affecting right non-dominant side: Secondary | ICD-10-CM

## 2016-07-02 DIAGNOSIS — R2681 Unsteadiness on feet: Secondary | ICD-10-CM

## 2016-07-02 DIAGNOSIS — M6281 Muscle weakness (generalized): Secondary | ICD-10-CM

## 2016-07-02 DIAGNOSIS — R2689 Other abnormalities of gait and mobility: Secondary | ICD-10-CM

## 2016-07-02 DIAGNOSIS — R278 Other lack of coordination: Secondary | ICD-10-CM

## 2016-07-02 NOTE — Therapy (Signed)
[facility] [address] [place], [place], [address] Phone: [phone]   Fax:  [phone]  Physical Therapy Treatment  Patient Details  Name: Sean Wilkerson MRN: [id number] Date of Birth: 1946/09/19 Referring Provider: Dr. Antony Contras  Encounter Date: 07/02/2016      PT End of Session - 07/02/16 1559    Visit Number 33   Number of Visits 31  Requesting 8 additional sessions   Date for PT Re-Evaluation 07/28/16   Authorization Type [employer]   Authorization Time Period --   PT Start Time 1450   PT Stop Time 1532   PT Time Calculation (min) 42 min   Equipment Utilized During Treatment Gait belt   Activity Tolerance Patient tolerated treatment well;Patient limited by fatigue   Behavior During Therapy [facility] for tasks assessed/performed      Past Medical History:  Diagnosis Date  . HLD (hyperlipidemia) 10/08/2015  . HTN (hypertension) 10/08/2015  . Hypertension   . Stroke [facility])     Past Surgical History:  Procedure Laterality Date  . NO PAST SURGERIES      There were no vitals filed for this visit.      Subjective Assessment - 07/02/16 1455    Subjective Pt reports no problems or issues since last visit and states that he was "worn out" after last treatment session.     Patient is accompained by: Family member  [name]-wife   Pertinent History L CVA 10-05-15 - hospitalized until Jan - D/C'd home with [facility] [place] finished up on 02-25-16   Patient Stated Goals "I want to be totally independent and I want to walk longer and further."   Currently in Pain? No/denies   Pain Onset Today   Multiple Pain Sites No                         [facility] Adult PT Treatment/Exercise - 07/02/16 1445      Transfers   Transfers Sit to Stand;Stand to [employer] Transfers   Sit to Stand 5: Supervision;With upper extremity assist;From chair/3-in-1   Stand to Sit 5:  Supervision;With upper extremity assist;With armrests   Stand to Sit Details Verbal cues and demo to ensure pt fully turns around with legs against the chair before reaching back to sit down   Stand Pivot Transfers 5: Supervision;With armrests     Ambulation/Gait   Ambulation/Gait Yes   Ambulation/Gait Assistance 5: Supervision;4: Min guard   Ambulation/Gait Assistance Details Pt required supervision during ambulation with RW and minA with SBQC as well as verbal cues to maintain upright posture and take longer steps bilat with incr weight shift on R LE. Pt limited in distance by fatigue when walking outdoors.   Ambulation Distance (Feet) 100 Feet  SBQC: 1x158f and 1x460fRW: 2x8053f  Assistive device Rolling walker;Small based quad cane   Gait Pattern Step-to pattern;Decreased arm swing - right;Decreased arm swing - left;Decreased step length - right;Decreased step length - left;Decreased stance time - right;Decreased hip/knee flexion - right;Decreased hip/knee flexion - left;Decreased weight shift to right;Narrow base of support;Trunk flexed;Poor foot clearance - right   Ambulation Surface Level;Unlevel;Indoor;Outdoor;Paved   Curb 3: Mod assist   Curb Details (indicate cue type and reason) Veral cuing for sequencing with SBQC and foot placement.  Pt able to verbal is proper sequencing and foot/SBQC placement after activity  PT Education - 07/02/16 1557    Education provided Yes   Education Details Pt educated on how to safely ambulate out in the community when he decides to go out to eat.  Pt discussed with pt and wife about current goals, progress made, and POC to determine future plans in therapy.  Pt also educated on how to adjust [facility].   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 (Target Date: 07/23/16)   Time 4   Period Weeks   Status New     PT SHORT TERM GOAL #2   Title Pt will ambulate with LRAD 265f with supervision outdoors in order to go out to eat with his wife.   Time 4   Period Weeks   Status New     PT SHORT TERM GOAL #3   Title Pt will be able to perform sit to stand with supervision from the lowest mat table height to enable pt go to the bathroom using standard toilet with supervision.   Time 4   Period Weeks   Status New     PT SHORT TERM GOAL #4   Title Pt will be independent with continuing HEP and fitness plan to maintain progress made in therapy and good health.   Time 4   Period Weeks   Status New           PT Long Term Goals - 07/02/16 1714      PT LONG TERM GOAL #1   Title Modified independent with basic transfers.  Modified Target Date: 06/24/16)   Baseline met 06/25/16 with RW, R HO and RAFO   Time 8   Period Weeks   Status Achieved     PT LONG TERM GOAL #2   Title Modified independent with bed mobility.  (Modified Target Date: 06/24/16)   Baseline met 06/25/16 (states is doing at home by himself without brace)   Time 8   Period Weeks   Status Achieved     PT LONG TERM GOAL #3   Title Amb. 100' with LRAD with S for incr. household amb.  (Modified Target Date: 06/24/16)   Baseline met 06/25/16 at S level with RW, R HO, R AFO   Time 8   Period Weeks   Status Achieved  and revised distance     PT LONG TERM GOAL #4   Title Amb. 277 with hemiwalker with CGA for incr. community accessibility.  (Modified Target Date: 06/24/16)   Baseline 07/02/16:  Pt ambulated 1031fon unlevel, paved surfaces [facility].   Time 8   Period Weeks   Status Partially Met     PT LONG TERM GOAL #5   Title Negotiate steps (4) with S with use of hand rail .  (Modified Target Date: 06/24/16)   Baseline GOAL MET: 07/02/16   Time 8   Period Weeks    Status Achieved     PT LONG TERM GOAL #6   Title Perform Berg balance test when appropriate and establish goal as needed.  (05-04-16)   Baseline Will likely not be appropriate due to fall risk without device.    Time 8   Period Weeks  Status Deferred               Plan - 07/02/16 1600    Clinical Impression Statement Pt demonstrated an improvement in his ability to ambulate outside negotiating inclines and declines in the walking surface as well as negotiating curbs.  Pt required multiple rest breaks during gait activities outdoors, however, was able to progress his distance further than he has in the past.  He continues to demonstrate gait deviations, decr in strength, balance, and activity tolerance as well as lack of confidence that can be address with PT. PT discussed with pt and wife he plan for future visits with pt and wife verabalizing desired goals for what they would like to work on for the next four weeks.  He was able to meet all STGs and all but two LTGs as one goal was partially met and one was deferred.   Rehab Potential Good   PT Frequency 2x / week   PT Duration 4 weeks   PT Treatment/Interventions ADLs/Self Care Home Management;DME Instruction;Balance training;Therapeutic exercise;Therapeutic activities;Functional mobility training;Stair training;Gait training;Neuromuscular re-education;Patient/family education;Orthotic Fit/Training;Passive range of motion   PT Next Visit Plan Gait indoors/outdoors increasing distance and activity tolerance, sit to stands, balance and strengthening exercises   PT Home Exercise Plan RLE strengthening   Consulted and Agree with Plan of Care Patient;Family member/caregiver   Family Member Consulted spouse [name]      Patient will benefit from skilled therapeutic intervention in order to improve the following deficits and impairments:  Abnormal gait, Decreased activity tolerance, Decreased balance, Decreased coordination, Decreased  endurance, Decreased mobility, Decreased range of motion, Decreased strength, Impaired tone, Impaired UE functional use, Obesity  Visit Diagnosis: Unsteadiness on feet  Hemiplegia and hemiparesis following cerebral infarction affecting right non-dominant side (HCC)  Muscle weakness (generalized)  Other abnormalities of gait and mobility     Problem List Patient Active Problem List   Diagnosis Date Noted  . Slow transit constipation   . Neuropathic pain   . Stroke, acute, thrombotic ([place]) 10/08/2015  . Hemiparesis, aphasia, and dysphagia as late effect of cerebrovascular accident (CVA) ([place]) 10/08/2015  . Essential hypertension 10/08/2015  . HLD (hyperlipidemia) 10/08/2015  . Hemiplegia and hemiparesis following cerebral infarction affecting right dominant side ([place])   . Right hemiplegia ([place]) 10/06/2015  . Stroke (cerebrum) ([place]) 10/05/2015  . CVA (cerebral infarction) 10/05/2015    Katherine Mantle, SPT 07/02/2016, 5:58 PM  [facility] [address] [place], [place], [address] Phone: [phone]   Fax:  [phone]  Name: Anderson Middlebrooks MRN: [id number] Date of Birth: 1946-07-14

## 2016-07-02 NOTE — Therapy (Signed)
[facility] [address] [place], [place], [address] Phone: [phone]   Fax:  [phone]  Occupational Therapy Treatment  Patient Details  Name: Sean Wilkerson MRN: [id number] Date of Birth: Jul 29, 1946 Referring Provider: Dr. Leonie Man  Encounter Date: 07/02/2016      OT End of Session - 07/02/16 1704    Visit Number 32   Number of Visits 32   Date for OT Re-Evaluation 06/24/16   Authorization Type UMR MCR   Authorization Time Period g code and PN every 10th visit   Authorization - Visit Number 38   Authorization - Number of Visits 32   OT Start Time 1325   OT Stop Time 1415   OT Time Calculation (min) 50 min   Activity Tolerance Patient tolerated treatment well   Behavior During Therapy [facility] for tasks assessed/performed      Past Medical History:  Diagnosis Date  . HLD (hyperlipidemia) 10/08/2015  . HTN (hypertension) 10/08/2015  . Hypertension   . Stroke [facility])     Past Surgical History:  Procedure Laterality Date  . NO PAST SURGERIES      There were no vitals filed for this visit.      Subjective Assessment - 07/02/16 1700    Subjective  I am getting used to the idea of one month   Pertinent History see Epic, L CVA   Patient Stated Goals to be more indpendent   Currently in Pain? No/denies   Pain Score 0-No pain                      OT Treatments/Exercises (OP) - 07/02/16 0001      ADLs   LB Dressing Practiced doffing right shoe and AFO.  Patient relies on wife to complete this.  Discussed strategies for removing shoe / brace independentl, and with repetition, able to doff  right shoe.  Patient unable to tie left shoe, issued elastic laces, and laced into shoe.  Patient able to don left shoe now without assistance.       Neurological Re-education Exercises   Other Exercises 1 Neuromuscular reeducation to address scapular stability and more sustained activation first in  sidelying then in sitting.  Patient with improved consistent weight shift toward right in sitting, Still needs mod cueing / faciliitatition in standing.                  OT Education - 07/02/16 1703    Education provided Yes   Education Details elastic shoelaces, method to doff right shoe / brace   Person(s) Educated Patient   Methods Explanation;Demonstration;Tactile cues;Verbal cues   Comprehension Verbalized understanding;Returned demonstration          OT Short Term Goals - 07/02/16 1705      OT SHORT TERM GOAL #1   Title I with inital HEP.- check 04/03/16   Status Achieved     OT SHORT TERM GOAL #2   Title Pt will demonstrate understanding of RUE positioning to minimize pain and risk for injury including splint wear PRN.   Status Achieved     OT SHORT TERM GOAL #3   Title Pt will consistently donn shirt with min A.   Status Achieved     OT SHORT TERM GOAL #4   Title Pt will perform toilet transfers with min A.   Status Achieved     OT SHORT TERM GOAL #5   Title Pt will donn pants with min  A using AE PRN.   Status Achieved     OT SHORT TERM GOAL #6   Title Pt will  perform shower transfer and bathe with min A.- 05/27/2016   Status Achieved           OT Long Term Goals - 07/02/16 1705      OT LONG TERM GOAL #1   Title Pt will donn shirt with set up only.- check 06/24/2016   Status Achieved     OT LONG TERM GOAL #2   Title Pt will donn pants with minguard for balance.   Status Achieved     OT LONG TERM GOAL #3   Title Pt will donn socks and shoes with min A.   Status Partially Met     OT LONG TERM GOAL #4   Title Pt will use RUE as a stabilizer for ADLS at least 25 % of the time with pain less than or equal to 3/10.   Status On-going     OT LONG TERM GOAL #5   Title Pt will demonstrate P/ROM shoulder flexion to 100* with pain no greater than 3/10.   Status Achieved     OT LONG TERM GOAL #6   Title Pt will demonstrate ability to stand at  countertop and retrieve an item from midlevel shelf with LUE, with supervision and no LOB.   Status Achieved               Plan - 07/02/16 1704    Clinical Impression Statement Patient with slow but steady improvement and increased independence with ADL / and functional mobility   Rehab Potential Good   Clinical Impairments Affecting Rehab Potential Potential for depression- will monitor and offer support, resources as warranted; severity of deficits   OT Frequency 2x / week   OT Duration 8 weeks   Plan NMR RUE, IADL - up on feet   Consulted and Agree with Plan of Care Patient      Patient will benefit from skilled therapeutic intervention in order to improve the following deficits and impairments:  Decreased coordination, Decreased range of motion, Increased edema, Decreased safety awareness, Decreased endurance, Decreased activity tolerance, Decreased knowledge of precautions, Impaired tone, Obesity, Pain, Impaired UE functional use, Decreased knowledge of use of DME, Decreased balance, Decreased cognition, Decreased mobility, Decreased strength, Impaired perceived functional ability, Impaired vision/preception  Visit Diagnosis: Unsteadiness on feet  Hemiplegia and hemiparesis following cerebral infarction affecting right non-dominant side (HCC)  Other lack of coordination  Muscle weakness (generalized)    Problem List Patient Active Problem List   Diagnosis Date Noted  . Slow transit constipation   . Neuropathic pain   . Stroke, acute, thrombotic ([place]) 10/08/2015  . Hemiparesis, aphasia, and dysphagia as late effect of cerebrovascular accident (CVA) ([place]) 10/08/2015  . Essential hypertension 10/08/2015  . HLD (hyperlipidemia) 10/08/2015  . Hemiplegia and hemiparesis following cerebral infarction affecting right dominant side ([place])   . Right hemiplegia ([place]) 10/06/2015  . Stroke (cerebrum) ([place]) 10/05/2015  . CVA (cerebral infarction) 10/05/2015    Mariah Milling, OTR/L 07/02/2016, 5:07 PM  [facility] [address] [place], [place], [address] Phone: [phone]   Fax:  [phone]  Name: Sean Wilkerson MRN: [id number] Date of Birth: Apr 07, 1946

## 2016-07-05 ENCOUNTER — Encounter: Payer: Self-pay | Admitting: Occupational Therapy

## 2016-07-05 ENCOUNTER — Ambulatory Visit: Payer: Medicare Other | Admitting: Occupational Therapy

## 2016-07-05 ENCOUNTER — Ambulatory Visit: Payer: Medicare Other | Admitting: Physical Therapy

## 2016-07-05 VITALS — BP 151/75 | HR 65

## 2016-07-05 DIAGNOSIS — I69353 Hemiplegia and hemiparesis following cerebral infarction affecting right non-dominant side: Secondary | ICD-10-CM | POA: Diagnosis not present

## 2016-07-05 DIAGNOSIS — R2681 Unsteadiness on feet: Secondary | ICD-10-CM

## 2016-07-05 DIAGNOSIS — R2689 Other abnormalities of gait and mobility: Secondary | ICD-10-CM

## 2016-07-05 DIAGNOSIS — R278 Other lack of coordination: Secondary | ICD-10-CM

## 2016-07-05 DIAGNOSIS — M6281 Muscle weakness (generalized): Secondary | ICD-10-CM

## 2016-07-05 NOTE — Therapy (Signed)
[facility] [address] [place], [place], [address] Phone: [phone]   Fax:  [phone]  Physical Therapy Treatment  Patient Details  Name: Sean Wilkerson MRN: [id number] Date of Birth: 12/22/1945 Referring Provider: Dr. Antony Contras  Encounter Date: 07/05/2016      PT End of Session - 07/05/16 1310    Visit Number 34   Number of Visits 41   Date for PT Re-Evaluation 07/28/16   Authorization Type [employer] Medicare   Authorization Time Period     PT Start Time 1017   PT Stop Time 1101   PT Time Calculation (min) 44 min   Equipment Utilized During Treatment Gait belt   Activity Tolerance Patient tolerated treatment well;Patient limited by fatigue   Behavior During Therapy [facility] for tasks assessed/performed      Past Medical History:  Diagnosis Date  . HLD (hyperlipidemia) 10/08/2015  . HTN (hypertension) 10/08/2015  . Hypertension   . Stroke [facility])     Past Surgical History:  Procedure Laterality Date  . NO PAST SURGERIES      Vitals:   07/05/16 1028  BP: (!) 151/75  Pulse: 65                         [facility] Adult PT Treatment/Exercise - 07/05/16 0001      Transfers   Transfers Sit to Stand;Stand to Sit   Sit to Stand 5: Supervision   Sit to Stand Details (indicate cue type and reason) cueing for symmetrical LE placement, for symmetrical WB   Stand to Sit 5: Supervision   Stand to Sit Details cueing for symmetrical LE placement, for symmetrical WB; for slow, controlled descent   Comments Blocked practice of sit <> stand from EOM (neutral height) x8 reps (to pt fatigue) with cueing as described above, in addition to cueing for scooting to EOM prior to initiating sit <> stand.     Ambulation/Gait   Ambulation/Gait Yes   Ambulation/Gait Assistance 5: Supervision;3: Mod assist   Ambulation/Gait Assistance Details Gait x242' concutively (x8.5 minues, to pt fatigue) with RW to assess  functional endurance, initiating home walking program. Gait x90' without AD with one 180-degree turn with mod A, multimodal cueing for B hip extension, R pelvic protraction, full lateral weight shift to R, and full anterior weight shift (emphasis during LLE advancement). Final gait trial x120' (from back of gym to seat in lobby) with RW, R h/o requiring cueing for B hip extension.   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 (Target Date: 07/23/16)   Time 4   Period Weeks   Status New     PT SHORT TERM GOAL #2   Title Pt will ambulate with LRAD 23f with supervision outdoors in order to go out to eat with his wife.   Time 4  Period Weeks   Status New     PT SHORT TERM GOAL #3   Title Pt will be able to perform sit to stand with supervision from the lowest mat table height to enable pt go to the bathroom using standard toilet with supervision.   Time 4   Period Weeks   Status New     PT SHORT TERM GOAL #4   Title Pt will be independent with continuing HEP and fitness plan to maintain progress made in therapy and good health.   Time 4   Period Weeks   Status New           PT Long Term Goals - 07/02/16 1714      PT LONG TERM GOAL #1   Title Modified independent with basic transfers.  Modified Target Date: 06/24/16)   Baseline met 06/25/16 with RW, R HO and RAFO   Time 8   Period Weeks   Status Achieved     PT LONG TERM GOAL #2    Title Modified independent with bed mobility.  (Modified Target Date: 06/24/16)   Baseline met 06/25/16 (states is doing at home by himself without brace)   Time 8   Period Weeks   Status Achieved     PT LONG TERM GOAL #3   Title Amb. 100' with LRAD with S for incr. household amb.  (Modified Target Date: 06/24/16)   Baseline met 06/25/16 at S level with RW, R HO, R AFO   Time 8   Period Weeks   Status Achieved  and revised distance     PT LONG TERM GOAL #4   Title Amb. 63' with hemiwalker with CGA for incr. community accessibility.  (Modified Target Date: 06/24/16)   Baseline 07/02/16:  Pt ambulated 129ft on unlevel, paved surfaces with [facility] with supervision.   Time 8   Period Weeks   Status Partially Met     PT LONG TERM GOAL #5   Title Negotiate steps (4) with S with use of hand rail .  (Modified Target Date: 06/24/16)   Baseline GOAL MET: 07/02/16   Time 8   Period Weeks   Status Achieved     PT LONG TERM GOAL #6   Title Perform Berg balance test when appropriate and establish goal as needed.  (05-04-16)   Baseline Will likely not be appropriate due to fall risk without device.    Time 8   Period Weeks   Status Deferred               Plan - 07/05/16 1311    Clinical Impression Statement Session focused on initiating walking program to increase functional endurance and ambulation tolerance; as well as increasing pt independence with functional transfers to address pt-stated goal to stand independently from home toilet.    Rehab Potential Good   PT Frequency 2x / week   PT Duration 4 weeks   PT Treatment/Interventions ADLs/Self Care Home Management;DME Instruction;Balance training;Therapeutic exercise;Therapeutic activities;Functional mobility training;Stair training;Gait training;Neuromuscular re-education;Patient/family education;Orthotic Fit/Training;Passive range of motion   PT Next Visit Plan Gait indoors/outdoors increasing distance and activity tolerance, balance and  strengthening exercises, gait with LBQC and without AD   PT Home Exercise Plan RLE strengthening   Consulted and Agree with Plan of Care Patient      Patient will benefit from skilled therapeutic intervention in order to improve the following deficits and impairments:  Abnormal gait, Decreased activity tolerance, Decreased balance, Decreased coordination, Decreased endurance, Decreased mobility, Decreased  range of motion, Decreased strength, Impaired tone, Impaired UE functional use, Obesity  Visit Diagnosis: Other abnormalities of gait and mobility  Unsteadiness on feet  Hemiplegia and hemiparesis following cerebral infarction affecting right non-dominant side [facility])     Problem List Patient Active Problem List   Diagnosis Date Noted  . Slow transit constipation   . Neuropathic pain   . Stroke, acute, thrombotic ([place]) 10/08/2015  . Hemiparesis, aphasia, and dysphagia as late effect of cerebrovascular accident (CVA) ([place]) 10/08/2015  . Essential hypertension 10/08/2015  . HLD (hyperlipidemia) 10/08/2015  . Hemiplegia and hemiparesis following cerebral infarction affecting right dominant side ([place])   . Right hemiplegia ([place]) 10/06/2015  . Stroke (cerebrum) ([place]) 10/05/2015  . CVA (cerebral infarction) 10/05/2015   Billie Ruddy, PT, [facility] [address] [place], [place], [address] Phone: [phone]   Fax:  [phone] 07/05/16, 1:15 PM  Name: Jerard Bays MRN: [id number] Date of Birth: June 27, 1946

## 2016-07-05 NOTE — Patient Instructions (Signed)
Walking Program:  Begin walking for exercise for 8 and a half minutes, 1 time/day, 4-5 days/week.   Progress your walking program by adding 1 minute to your routine each week, as tolerated. Be sure to wear good walking shoes, use your rolling walker, and walk in a safe environment (indoors unless Sean Wilkerson is standing right next to you) and only progress to your tolerance.      Functional Quadriceps: Sit to Stand    Sit on edge of chair, feet flat on floor. Make sure feet are even/symmetrical.  Scoot to edge of seat. Stand upright, extending knees fully, making sure you bear equal weight through the right and left leg. Repeat _8__ times per set.  Do __2-3_ sessions per day.  *Progress exercise by adding 1-2 reps per week, as tolerated.  Copyright  VHI. All rights reserved.

## 2016-07-05 NOTE — Therapy (Signed)
[facility] [address] [place], [place], [address] Phone: [phone]   Fax:  [phone]  Occupational Therapy Treatment  Patient Details  Name: Sean Wilkerson MRN: [id number] Date of Birth: [birth year] Referring Provider: Dr. Leonie Man  Encounter Date: 07/05/2016      OT End of Session - 07/05/16 1558    Visit Number 33   Number of Visits 40   Date for OT Re-Evaluation 08/05/16   Authorization Type UMR MCR   Authorization Time Period g code and PN every 10th visit   Authorization - Visit Number 61   Authorization - Number of Visits 40   OT Start Time 1145   OT Stop Time 1230   OT Time Calculation (min) 45 min   Activity Tolerance Patient tolerated treatment well   Behavior During Therapy [facility] for tasks assessed/performed      Past Medical History:  Diagnosis Date  . HLD (hyperlipidemia) 10/08/2015  . HTN (hypertension) 10/08/2015  . Hypertension   . Stroke [facility])     Past Surgical History:  Procedure Laterality Date  . NO PAST SURGERIES      There were no vitals filed for this visit.      Subjective Assessment - 07/05/16 1155    Subjective  I used my cane and walked into the closet for the first time.  I picked out my own clothes.     Pertinent History see Epic, L CVA   Patient Stated Goals to be more indpendent   Currently in Pain? No/denies   Pain Score 0-No pain                      OT Treatments/Exercises (OP) - 07/05/16 0001      ADLs   Functional Mobility Working with functional use, and pre functional use of right arm now that patient is beginnig to walk with small based quad cane.  Patient now able to generate enough movement and force to open lightweight door with horizontally oriented door handle.  Patient needs mod cueing for best body position for right arm motion, and for function.  Patient needs increased time as this is frustrating for him to attemt to use his right  arm.       Neurological Re-education Exercises   Other Exercises 1 Neuromuscular reeducation to address active assisted  movement in right arm while standing.   Patient with improved ability to maintain active right leg during this exercise, and able to stand with good postural control for 5 minutes.  However, during functional gait, patient continues to stand with heavy right arm dangling forward, and with hip flexion.  Worked today to emphasize functional / prefunctional use of right arm, and the importance of finding tasks that can be completed with right arm to increase movement and strength.                  OT Education - 07/05/16 1557    Education provided Yes   Education Details  the importance of prefunctional/ functional use of right arm,   Person(s) Educated Patient   Methods Explanation;Demonstration;Tactile cues;Verbal cues   Comprehension Need further instruction;Verbalized understanding          OT Short Term Goals - 07/02/16 1705      OT SHORT TERM GOAL #1   Title I with inital HEP.- check 04/03/16   Status Achieved     OT SHORT TERM GOAL #2   Title Pt will  demonstrate understanding of RUE positioning to minimize pain and risk for injury including splint wear PRN.   Status Achieved     OT SHORT TERM GOAL #3   Title Pt will consistently donn shirt with min A.   Status Achieved     OT SHORT TERM GOAL #4   Title Pt will perform toilet transfers with min A.   Status Achieved     OT SHORT TERM GOAL #5   Title Pt will donn pants with min A using AE PRN.   Status Achieved     OT SHORT TERM GOAL #6   Title Pt will  perform shower transfer and bathe with min A.- 05/27/2016   Status Achieved           OT Long Term Goals - 07/02/16 1705      OT LONG TERM GOAL #1   Title Pt will donn shirt with set up only.- check 06/24/2016   Status Achieved     OT LONG TERM GOAL #2   Title Pt will donn pants with minguard for balance.   Status Achieved     OT LONG  TERM GOAL #3   Title Pt will donn socks and shoes with min A.   Status Partially Met     OT LONG TERM GOAL #4   Title Pt will use RUE as a stabilizer for ADLS at least 25 % of the time with pain less than or equal to 3/10.   Status On-going     OT LONG TERM GOAL #5   Title Pt will demonstrate P/ROM shoulder flexion to 100* with pain no greater than 3/10.   Status Achieved     OT LONG TERM GOAL #6   Title Pt will demonstrate ability to stand at countertop and retrieve an item from midlevel shelf with LUE, with supervision and no LOB.   Status Achieved               Plan - 07/05/16 1600    Clinical Impression Statement Patient showing increased activation in right shoulder and elbow in forced use situations.     Rehab Potential Good   Clinical Impairments Affecting Rehab Potential Potential for depression- will monitor and offer support, resources as warranted; severity of deficits   OT Frequency 2x / week   OT Duration 8 weeks   OT Treatment/Interventions Self-care/ADL training;Therapeutic exercise;Patient/family education;Balance training;Splinting;Neuromuscular education;Ultrasound;Energy conservation;Therapeutic exercises;Therapeutic activities;DME and/or AE instruction;Parrafin;Cryotherapy;Electrical Stimulation;Fluidtherapy;Cognitive remediation/compensation;Visual/perceptual remediation/compensation;Passive range of motion;Contrast Bath;Moist Heat   Plan Forced use RUE/LE- up on feet   Consulted and Agree with Plan of Care Patient      Patient will benefit from skilled therapeutic intervention in order to improve the following deficits and impairments:  Decreased coordination, Decreased range of motion, Increased edema, Decreased safety awareness, Decreased endurance, Decreased activity tolerance, Decreased knowledge of precautions, Impaired tone, Obesity, Pain, Impaired UE functional use, Decreased knowledge of use of DME, Decreased balance, Decreased cognition, Decreased  mobility, Decreased strength, Impaired perceived functional ability, Impaired vision/preception  Visit Diagnosis: Unsteadiness on feet  Hemiplegia and hemiparesis following cerebral infarction affecting right non-dominant side (HCC)  Muscle weakness (generalized)  Other lack of coordination    Problem List Patient Active Problem List   Diagnosis Date Noted  . Slow transit constipation   . Neuropathic pain   . Stroke, acute, thrombotic ([place]) 10/08/2015  . Hemiparesis, aphasia, and dysphagia as late effect of cerebrovascular accident (CVA) ([place]) 10/08/2015  . Essential hypertension 10/08/2015  . HLD (hyperlipidemia)  10/08/2015  . Hemiplegia and hemiparesis following cerebral infarction affecting right dominant side ([place])   . Right hemiplegia ([place]) 10/06/2015  . Stroke (cerebrum) ([place]) 10/05/2015  . CVA (cerebral infarction) 10/05/2015    Mariah Milling 07/05/2016, 4:02 PM  [facility] [address] [place], [place], [address] Phone: [phone]   Fax:  [phone]  Name: Sean Wilkerson MRN: [id number] Date of Birth: September 05, 1946

## 2016-07-09 ENCOUNTER — Encounter: Payer: Self-pay | Admitting: Occupational Therapy

## 2016-07-09 ENCOUNTER — Ambulatory Visit: Payer: Medicare Other | Admitting: Occupational Therapy

## 2016-07-09 ENCOUNTER — Encounter: Payer: Self-pay | Admitting: Physical Therapy

## 2016-07-09 ENCOUNTER — Ambulatory Visit: Payer: Medicare Other | Admitting: Physical Therapy

## 2016-07-09 DIAGNOSIS — M6281 Muscle weakness (generalized): Secondary | ICD-10-CM

## 2016-07-09 DIAGNOSIS — R2681 Unsteadiness on feet: Secondary | ICD-10-CM

## 2016-07-09 DIAGNOSIS — I69353 Hemiplegia and hemiparesis following cerebral infarction affecting right non-dominant side: Secondary | ICD-10-CM

## 2016-07-09 DIAGNOSIS — R2689 Other abnormalities of gait and mobility: Secondary | ICD-10-CM

## 2016-07-09 DIAGNOSIS — R278 Other lack of coordination: Secondary | ICD-10-CM

## 2016-07-09 NOTE — Therapy (Signed)
[facility] [address] [place], [place], [address] Phone: [phone]   Fax:  [phone]  Occupational Therapy Treatment  Patient Details  Name: Sean Wilkerson MRN: [id number] Date of Birth: 04-19-1946 Referring Provider: Dr. Leonie Man  Encounter Date: 07/09/2016      OT End of Session - 07/09/16 1453    Visit Number 34   Number of Visits 40   Date for OT Re-Evaluation 08/05/16   Authorization Type UMR MCR   Authorization Time Period g code and PN every 10th visit   Authorization - Visit Number 34   Authorization - Number of Visits 40   OT Start Time 1400   OT Stop Time 1445   OT Time Calculation (min) 45 min   Activity Tolerance Patient tolerated treatment well   Behavior During Therapy [facility] for tasks assessed/performed      Past Medical History:  Diagnosis Date  . HLD (hyperlipidemia) 10/08/2015  . HTN (hypertension) 10/08/2015  . Hypertension   . Stroke [facility])     Past Surgical History:  Procedure Laterality Date  . NO PAST SURGERIES      There were no vitals filed for this visit.      Subjective Assessment - 07/09/16 1429    Subjective  I am going to get back out into life!   Pertinent History see Epic, L CVA   Patient Stated Goals to be more indpendent   Currently in Pain? No/denies   Pain Score 0-No pain                      OT Treatments/Exercises (OP) - 07/09/16 1431      ADLs   UB Dressing Patient is slowly regaining interest in prior activities.  Patient brought tie to session today to attempt to self tie.  Patient able to tie tie- needed assistance to tighten knot .       Acupuncturist Location right wrist and digits -    Electrical Stimulation Action flexion and extension   Electrical Stimulation Parameters on / off 10 sec, rate 50pps, symmetrical, synchronous, ramp up 2 secpw 250 10 min    Electrical Stimulation Goals  Neuromuscular facilitation                OT Education - 07/09/16 1453    Education provided Yes   Education Details tying tie- one handed   Person(s) Educated Patient   Methods Explanation;Demonstration;Tactile cues   Comprehension Need further instruction          OT Short Term Goals - 07/02/16 1705      OT SHORT TERM GOAL #1   Title I with inital HEP.- check 04/03/16   Status Achieved     OT SHORT TERM GOAL #2   Title Pt will demonstrate understanding of RUE positioning to minimize pain and risk for injury including splint wear PRN.   Status Achieved     OT SHORT TERM GOAL #3   Title Pt will consistently donn shirt with min A.   Status Achieved     OT SHORT TERM GOAL #4   Title Pt will perform toilet transfers with min A.   Status Achieved     OT SHORT TERM GOAL #5   Title Pt will donn pants with min A using AE PRN.   Status Achieved     OT SHORT TERM GOAL #6   Title Pt will  perform shower transfer  and bathe with min A.- 05/27/2016   Status Achieved           OT Long Term Goals - 07/02/16 1705      OT LONG TERM GOAL #1   Title Pt will donn shirt with set up only.- check 06/24/2016   Status Achieved     OT LONG TERM GOAL #2   Title Pt will donn pants with minguard for balance.   Status Achieved     OT LONG TERM GOAL #3   Title Pt will donn socks and shoes with min A.   Status Partially Met     OT LONG TERM GOAL #4   Title Pt will use RUE as a stabilizer for ADLS at least 25 % of the time with pain less than or equal to 3/10.   Status On-going     OT LONG TERM GOAL #5   Title Pt will demonstrate P/ROM shoulder flexion to 100* with pain no greater than 3/10.   Status Achieved     OT LONG TERM GOAL #6   Title Pt will demonstrate ability to stand at countertop and retrieve an item from midlevel shelf with LUE, with supervision and no LOB.   Status Achieved               Plan - 07/09/16 1454    Clinical Impression Statement Patient  with decent response to distal right UE e stim - but without carryover following stimualtion.     Rehab Potential Good   Clinical Impairments Affecting Rehab Potential Potential for depression- will monitor and offer support, resources as warranted; severity of deficits   OT Frequency 2x / week   OT Duration 8 weeks   OT Treatment/Interventions Self-care/ADL training;Therapeutic exercise;Patient/family education;Balance training;Splinting;Neuromuscular education;Ultrasound;Energy conservation;Therapeutic exercises;Therapeutic activities;DME and/or AE instruction;Parrafin;Cryotherapy;Electrical Stimulation;Fluidtherapy;Cognitive remediation/compensation;Visual/perceptual remediation/compensation;Passive range of motion;Contrast Bath;Moist Heat   Plan Forced use of right UE - Up on feet, Increased independence   Consulted and Agree with Plan of Care Patient      Patient will benefit from skilled therapeutic intervention in order to improve the following deficits and impairments:  Decreased coordination, Decreased range of motion, Increased edema, Decreased safety awareness, Decreased endurance, Decreased activity tolerance, Decreased knowledge of precautions, Impaired tone, Obesity, Pain, Impaired UE functional use, Decreased knowledge of use of DME, Decreased balance, Decreased cognition, Decreased mobility, Decreased strength, Impaired perceived functional ability, Impaired vision/preception  Visit Diagnosis: Hemiplegia and hemiparesis following cerebral infarction affecting right non-dominant side (HCC)  Unsteadiness on feet  Muscle weakness (generalized)  Other lack of coordination    Problem List Patient Active Problem List   Diagnosis Date Noted  . Slow transit constipation   . Neuropathic pain   . Stroke, acute, thrombotic ([place]) 10/08/2015  . Hemiparesis, aphasia, and dysphagia as late effect of cerebrovascular accident (CVA) ([place]) 10/08/2015  . Essential hypertension 10/08/2015   . HLD (hyperlipidemia) 10/08/2015  . Hemiplegia and hemiparesis following cerebral infarction affecting right dominant side ([place])   . Right hemiplegia ([place]) 10/06/2015  . Stroke (cerebrum) ([place]) 10/05/2015  . CVA (cerebral infarction) 10/05/2015    Mariah Milling, OTR/L 07/09/2016, 2:56 PM  [facility] [address] [place], [place], [address] Phone: [phone]   Fax:  [phone]  Name: Sean Wilkerson MRN: [id number] Date of Birth: 1945/11/26

## 2016-07-09 NOTE — Therapy (Signed)
[facility] [address] [place], [place], [address] Phone: [phone]   Fax:  [phone]  Physical Therapy Treatment  Patient Details  Name: Sean Wilkerson MRN: [id number] Date of Birth: 1946-06-11 Referring Provider: Dr. Antony Contras  Encounter Date: 07/09/2016      PT End of Session - 07/09/16 1324    Visit Number 35   Number of Visits 41   Date for PT Re-Evaluation 07/28/16   Authorization Type UHC Medicare   Authorization Time Period     PT Start Time 1319   PT Stop Time 1359   PT Time Calculation (min) 40 min   Equipment Utilized During Treatment Gait belt   Activity Tolerance Patient tolerated treatment well;Patient limited by fatigue   Behavior During Therapy [facility] for tasks assessed/performed      Past Medical History:  Diagnosis Date  . HLD (hyperlipidemia) 10/08/2015  . HTN (hypertension) 10/08/2015  . Hypertension   . Stroke [facility])     Past Surgical History:  Procedure Laterality Date  . NO PAST SURGERIES      There were no vitals filed for this visit.      Subjective Assessment - 07/09/16 1322    Subjective Has been walking every day for up to 8-10 minutes with walker program. No falls or pain currently, however did have a sharp pain in right leg this am with standing up that when away really fast.   Patient is accompained by: Family member   Pertinent History L CVA 10-05-15 - hospitalized until Jan - D/C'd home with [place], [place] finished up on 02-25-16   Patient Stated Goals "I want to be totally independent and I want to walk longer and further."   Currently in Pain? No/denies   Pain Score 0-No pain              [facility] Adult PT Treatment/Exercise - 07/09/16 1326      Transfers   Transfers Sit to Stand;Stand to Sit   Sit to Stand 5: Supervision;With upper extremity assist;With armrests;From bed;From chair/3-in-1   Stand to Sit 5: Supervision;With upper  extremity assist;With armrests;To bed;To chair/3-in-1   Number of Reps 10 reps;1 set   Transfer Cueing no UE assist from 20 inch mat with min guard assist for safety   Comments Pt demo improved bil LE placement and weight shifting today     Ambulation/Gait   Ambulation/Gait Yes   Ambulation/Gait Assistance 4: Min guard;4: Min assist   Ambulation/Gait Assistance Details episodes of left knee buckling increased as gait distance increased with pt self recovering all but 2 times needed up to min assist to recover.                  Ambulation Distance (Feet) 125 Feet  x1, 100 x1   Assistive device Small based quad cane   Gait Pattern Decreased step length - left;Decreased stance time - right;Decreased hip/knee flexion - right;Decreased weight shift to right;Step-through pattern;Trunk rotated posteriorly on right;Trunk flexed   Ambulation Surface Level;Indoor   Curb 4: Min assist;3: Mod assist   Curb Details (indicate cue type and reason) using aerobic step and small based quad cane. 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 (Target Date: 07/23/16)   Time 4   Period Weeks   Status New     PT SHORT TERM GOAL #2   Title Pt will ambulate with LRAD 230ft with supervision outdoors in order to go out to eat with his wife.   Time 4   Period Weeks   Status New     PT SHORT TERM GOAL #3   Title Pt will be able to perform sit to stand with supervision from the lowest mat table height to enable pt go to the bathroom using standard toilet with supervision.   Time 4   Period Weeks   Status New     PT SHORT TERM GOAL #4   Title Pt will be independent with continuing HEP and fitness plan to  maintain progress made in therapy and good health.   Time 4   Period Weeks   Status New           PT Long Term Goals - 07/02/16 1714      PT LONG TERM GOAL #1   Title Modified independent with basic transfers.  Modified Target Date: 06/24/16)   Baseline met 06/25/16 with RW, R HO and RAFO   Time 8   Period Weeks   Status Achieved     PT LONG TERM GOAL #2   Title Modified independent with bed mobility.  (Modified Target Date: 06/24/16)   Baseline met 06/25/16 (states is doing at home by himself without brace)   Time 8   Period Weeks   Status Achieved     PT LONG TERM GOAL #3   Title Amb. 100' with LRAD with S for incr. household amb.  (Modified Target Date: 06/24/16)   Baseline met 06/25/16 at S level with RW, R HO, R AFO   Time 8   Period Weeks   Status Achieved  and revised distance     PT LONG TERM GOAL #4   Title Amb. 73' with hemiwalker with CGA for incr. community accessibility.  (Modified Target Date: 06/24/16)   Baseline 07/02/16:  Pt ambulated 188ft on unlevel, paved surfaces with [facility] with supervision.   Time 8   Period Weeks   Status Partially Met     PT LONG TERM GOAL #5   Title Negotiate steps (4) with S with use of hand rail .  (Modified Target Date: 06/24/16)   Baseline GOAL MET: 07/02/16   Time 8   Period Weeks   Status Achieved     PT LONG TERM GOAL #6   Title Perform Berg balance test when appropriate and establish goal as needed.  (05-04-16)   Baseline Will likely not be appropriate due to fall risk without device.    Time 8   Period Weeks   Status Deferred            Plan - 07/09/16 1324    Clinical Impression Statement today's session focused on gait with small based quad cane including curbs and transfers from lower surfaces with pt demoing improved weight shifting in session. Pt continues to need assistance with curb negotiaiton. Pt should benefit from continued PT to progress toward unmet goals .   Rehab Potential Good   PT Frequency 2x / week    PT Duration 4 weeks   PT Treatment/Interventions ADLs/Self Care Home Management;DME Instruction;Balance training;Therapeutic exercise;Therapeutic  activities;Functional mobility training;Stair training;Gait training;Neuromuscular re-education;Patient/family education;Orthotic Fit/Training;Passive range of motion   PT Next Visit Plan Gait indoors/outdoors increasing distance and activity tolerance, balance and strengthening exercises, gait with LBQC and without AD   PT Home Exercise Plan RLE strengthening   Consulted and Agree with Plan of Care Patient      Patient will benefit from skilled therapeutic intervention in order to improve the following deficits and impairments:  Abnormal gait, Decreased activity tolerance, Decreased balance, Decreased coordination, Decreased endurance, Decreased mobility, Decreased range of motion, Decreased strength, Impaired tone, Impaired UE functional use, Obesity  Visit Diagnosis: Unsteadiness on feet  Hemiplegia and hemiparesis following cerebral infarction affecting right non-dominant side (HCC)  Muscle weakness (generalized)  Other abnormalities of gait and mobility     Problem List Patient Active Problem List   Diagnosis Date Noted  . Slow transit constipation   . Neuropathic pain   . Stroke, acute, thrombotic ([place]) 10/08/2015  . Hemiparesis, aphasia, and dysphagia as late effect of cerebrovascular accident (CVA) ([place]) 10/08/2015  . Essential hypertension 10/08/2015  . HLD (hyperlipidemia) 10/08/2015  . Hemiplegia and hemiparesis following cerebral infarction affecting right dominant side ([place])   . Right hemiplegia ([place]) 10/06/2015  . Stroke (cerebrum) ([place]) 10/05/2015  . CVA (cerebral infarction) 10/05/2015    Willow Ora, PTA, [facility] [address], [place], [place] [address] [phone] 07/09/16, 2:01 PM   Name: Sean Wilkerson MRN: [id number] Date of Birth: [birth year]

## 2016-07-12 ENCOUNTER — Ambulatory Visit: Payer: Medicare Other | Admitting: Occupational Therapy

## 2016-07-12 ENCOUNTER — Encounter: Payer: Self-pay | Admitting: Rehabilitation

## 2016-07-12 ENCOUNTER — Encounter: Payer: Self-pay | Admitting: Occupational Therapy

## 2016-07-12 ENCOUNTER — Ambulatory Visit: Payer: Medicare Other | Admitting: Rehabilitation

## 2016-07-12 DIAGNOSIS — M6281 Muscle weakness (generalized): Secondary | ICD-10-CM

## 2016-07-12 DIAGNOSIS — I69353 Hemiplegia and hemiparesis following cerebral infarction affecting right non-dominant side: Secondary | ICD-10-CM

## 2016-07-12 DIAGNOSIS — R2681 Unsteadiness on feet: Secondary | ICD-10-CM

## 2016-07-12 DIAGNOSIS — R2689 Other abnormalities of gait and mobility: Secondary | ICD-10-CM

## 2016-07-12 DIAGNOSIS — R278 Other lack of coordination: Secondary | ICD-10-CM

## 2016-07-12 NOTE — Therapy (Signed)
[facility] [address] [place], [place], [address] Phone: [phone]   Fax:  [phone]  Physical Therapy Treatment  Patient Details  Name: Sean Wilkerson MRN: [id number] Date of Birth: [birth year] Referring Provider: Dr. Antony Contras  Encounter Date: 07/12/2016      PT End of Session - 07/12/16 1323    Visit Number 36   Number of Visits 41   Date for PT Re-Evaluation 07/28/16   Authorization Type UHC Medicare   Authorization Time Period     PT Start Time 1318   PT Stop Time 1405   PT Time Calculation (min) 47 min   Equipment Utilized During Treatment Gait belt   Activity Tolerance Patient tolerated treatment well;Patient limited by fatigue   Behavior During Therapy [facility] for tasks assessed/performed      Past Medical History:  Diagnosis Date  . HLD (hyperlipidemia) 10/08/2015  . HTN (hypertension) 10/08/2015  . Hypertension   . Stroke [facility])     Past Surgical History:  Procedure Laterality Date  . NO PAST SURGERIES      There were no vitals filed for this visit.      Subjective Assessment - 07/12/16 1322    Subjective States he has been walking in hallway with cane, but not carpet yet, doing walking program.    Pertinent History L CVA 10-05-15 - hospitalized until Jan - D/C'd home with [place], [place] finished up on 02-25-16   Patient Stated Goals "I want to be totally independent and I want to walk longer and further."   Currently in Pain? No/denies            Gait:  Performed gait x 180' with SBQC over varying indoor surfaces with SBQC at min/guard A level with cues for posture and improved R hip protraction during stance phase of gait.  Pt did very well negotiating through tight spaces and over carpet, therefore feel he is safe to ambulate at home at all times with [facility] and assist from wife.  Pt and wife verbalized understanding.  Also continued to perform gait over  indoor and outdoor surfaces (paved unlevel) x 220' x 1 and another 230' x 1 with SBQC at min/guard level with cues for continued breathing throughout gait, esp outdoors, posture and improved stride length.  Pt progressing well but very fatigued following gait.    TA:  Addressed shower transfer in hopes of using grab bar in standing position vs still using w/c in bathroom.  Simulated stepping into and out of shower at home with grab bar.  Pt able to return demonstration, however would recommend continued practice, esp without brace to ensure best technique.  OT made aware and this was addressed during session.                      PT Education - 07/12/16 2052    Education provided Yes   Education Details ambulating at home over all surfaces with [facility]!   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 (Target Date: 07/23/16)   Time 4   Period Weeks   Status New  PT SHORT TERM GOAL #2   Title Pt will ambulate with LRAD 275ft with supervision outdoors in order to go out to eat with his wife.   Time 4   Period Weeks   Status New     PT SHORT TERM GOAL #3   Title Pt will be able to perform sit to stand with supervision from the lowest mat table height to enable pt go to the bathroom using standard toilet with supervision.   Time 4   Period Weeks   Status New     PT SHORT TERM GOAL #4   Title Pt will be independent with continuing HEP and fitness plan to maintain progress made in therapy and good health.   Time 4   Period Weeks   Status New           PT Long Term Goals - 07/02/16 1714      PT LONG TERM GOAL #1   Title Modified independent with basic transfers.  Modified Target Date: 06/24/16)   Baseline met 06/25/16 with RW, R HO and RAFO    Time 8   Period Weeks   Status Achieved     PT LONG TERM GOAL #2   Title Modified independent with bed mobility.  (Modified Target Date: 06/24/16)   Baseline met 06/25/16 (states is doing at home by himself without brace)   Time 8   Period Weeks   Status Achieved     PT LONG TERM GOAL #3   Title Amb. 100' with LRAD with S for incr. household amb.  (Modified Target Date: 06/24/16)   Baseline met 06/25/16 at S level with RW, R HO, R AFO   Time 8   Period Weeks   Status Achieved  and revised distance     PT LONG TERM GOAL #4   Title Amb. 79' with hemiwalker with CGA for incr. community accessibility.  (Modified Target Date: 06/24/16)   Baseline 07/02/16:  Pt ambulated 170ft on unlevel, paved surfaces with [facility] with supervision.   Time 8   Period Weeks   Status Partially Met     PT LONG TERM GOAL #5   Title Negotiate steps (4) with S with use of hand rail .  (Modified Target Date: 06/24/16)   Baseline GOAL MET: 07/02/16   Time 8   Period Weeks   Status Achieved     PT LONG TERM GOAL #6   Title Perform Berg balance test when appropriate and establish goal as needed.  (05-04-16)   Baseline Will likely not be appropriate due to fall risk without device.    Time 8   Period Weeks   Status Deferred               Plan - 07/12/16 2053    Clinical Impression Statement Skilled session focused on gait over varying indoor surfaces (including carpet) with SBQC in order to better simulate home.  Feel that pt can begin to ambulate at all times with [facility], pt and wife verbalized understanding.  Also continue to address gait over indoor and paved outdoor surfaces for increased distance as well as shower transfer.      Rehab Potential Good   PT Frequency 2x / week   PT Duration 4 weeks   PT Treatment/Interventions ADLs/Self Care Home Management;DME Instruction;Balance training;Therapeutic exercise;Therapeutic activities;Functional mobility training;Stair training;Gait training;Neuromuscular  re-education;Patient/family education;Orthotic Fit/Training;Passive range of motion   PT Next Visit Plan Gait indoors/outdoors increasing distance and activity tolerance, balance and strengthening exercises,  gait with SBQC and without AD, RLE NMR   PT Home Exercise Plan RLE strengthening   Consulted and Agree with Plan of Care Patient;Family member/caregiver   Family Member Consulted spouse [name]      Patient will benefit from skilled therapeutic intervention in order to improve the following deficits and impairments:  Abnormal gait, Decreased activity tolerance, Decreased balance, Decreased coordination, Decreased endurance, Decreased mobility, Decreased range of motion, Decreased strength, Impaired tone, Impaired UE functional use, Obesity  Visit Diagnosis: Hemiplegia and hemiparesis following cerebral infarction affecting right non-dominant side (HCC)  Unsteadiness on feet  Muscle weakness (generalized)  Other abnormalities of gait and mobility     Problem List Patient Active Problem List   Diagnosis Date Noted  . Slow transit constipation   . Neuropathic pain   . Stroke, acute, thrombotic ([place]) 10/08/2015  . Hemiparesis, aphasia, and dysphagia as late effect of cerebrovascular accident (CVA) ([place]) 10/08/2015  . Essential hypertension 10/08/2015  . HLD (hyperlipidemia) 10/08/2015  . Hemiplegia and hemiparesis following cerebral infarction affecting right dominant side ([place])   . Right hemiplegia ([place]) 10/06/2015  . Stroke (cerebrum) ([place]) 10/05/2015  . CVA (cerebral infarction) 10/05/2015    Sherena Machorro, Betha Loa 07/12/2016, 8:57 PM  [facility] [address] [place], [place], [address] Phone: [phone]   Fax:  [phone]  Name: Sean Wilkerson MRN: [id number] Date of Birth: Jul 11, 1946

## 2016-07-12 NOTE — Therapy (Signed)
[facility] [address] [place], [place], [address] Phone: [phone]   Fax:  [phone]  Occupational Therapy Treatment  Patient Details  Name: Sean Wilkerson MRN: [id number] Date of Birth: 1946/04/06 Referring Provider: Dr. Leonie Man  Encounter Date: 07/12/2016      OT End of Session - 07/12/16 1725    Visit Number 35   Number of Visits 40   Date for OT Re-Evaluation 08/05/16   Authorization Type UMR MCR   Authorization Time Period g code and PN every 10th visit   Authorization - Visit Number 35   Authorization - Number of Visits 40   OT Start Time 1400   OT Stop Time 1445   OT Time Calculation (min) 45 min   Activity Tolerance Patient tolerated treatment well   Behavior During Therapy [facility] for tasks assessed/performed      Past Medical History:  Diagnosis Date  . HLD (hyperlipidemia) 10/08/2015  . HTN (hypertension) 10/08/2015  . Hypertension   . Stroke [facility])     Past Surgical History:  Procedure Laterality Date  . NO PAST SURGERIES      There were no vitals filed for this visit.      Subjective Assessment - 07/12/16 1719    Subjective  I am tired.  (following PT session)   Patient is accompained by: Family member  wife   Pertinent History see Epic, L CVA   Patient Stated Goals to be more indpendent   Currently in Pain? No/denies   Pain Score 0-No pain                      OT Treatments/Exercises (OP) - 07/12/16 0001      ADLs   Bathing Worked with patient and wife to trouble shoot most effective shower transfer for home.  Patient still using wheelchair in bathroom for shower transfer.  Wife shared that patient wears tennis shoes for safety, but not AFO when stepping into and out of shower.  Patient has bilateral vertical grab bars in shower door to hold for entrance and exit.  First attempted side step into shower.  Patient able to effectively stand on right leg to step over  5.25" lip with left foot, but had difficulty generating sufficient force to lift right leg over shower lip.  Patient needed multiple repetitions, and became fatigued - unsafe.  Practiced backward transfer into shower using rolling walker.  Patient able to step left foot into shower backwards, and then sit on shower seat.  Patient unable to effectively step right foot into shower while standing.  Patient and wife will attempt backward shower transfer this week.                  OT Education - 07/12/16 1725    Education provided Yes   Education Details shower transfer   Person(s) Educated Patient;Spouse   Methods Explanation;Demonstration;Tactile cues;Verbal cues   Comprehension Verbalized understanding;Returned demonstration          OT Short Term Goals - 07/02/16 1705      OT SHORT TERM GOAL #1   Title I with inital HEP.- check 04/03/16   Status Achieved     OT SHORT TERM GOAL #2   Title Pt will demonstrate understanding of RUE positioning to minimize pain and risk for injury including splint wear PRN.   Status Achieved     OT SHORT TERM GOAL #3   Title Pt will consistently donn shirt  with min A.   Status Achieved     OT SHORT TERM GOAL #4   Title Pt will perform toilet transfers with min A.   Status Achieved     OT SHORT TERM GOAL #5   Title Pt will donn pants with min A using AE PRN.   Status Achieved     OT SHORT TERM GOAL #6   Title Pt will  perform shower transfer and bathe with min A.- 05/27/2016   Status Achieved           OT Long Term Goals - 07/02/16 1705      OT LONG TERM GOAL #1   Title Pt will donn shirt with set up only.- check 06/24/2016   Status Achieved     OT LONG TERM GOAL #2   Title Pt will donn pants with minguard for balance.   Status Achieved     OT LONG TERM GOAL #3   Title Pt will donn socks and shoes with min A.   Status Partially Met     OT LONG TERM GOAL #4   Title Pt will use RUE as a stabilizer for ADLS at least 25 % of the  time with pain less than or equal to 3/10.   Status On-going     OT LONG TERM GOAL #5   Title Pt will demonstrate P/ROM shoulder flexion to 100* with pain no greater than 3/10.   Status Achieved     OT LONG TERM GOAL #6   Title Pt will demonstrate ability to stand at countertop and retrieve an item from midlevel shelf with LUE, with supervision and no LOB.   Status Achieved               Plan - 07/12/16 1726    Clinical Impression Statement Patient progressing with increased ADL independence due to improved functional mobility   Rehab Potential Good   Clinical Impairments Affecting Rehab Potential Potential for depression- will monitor and offer support, resources as warranted; severity of deficits   OT Frequency 2x / week   OT Duration 8 weeks   OT Treatment/Interventions Self-care/ADL training;Therapeutic exercise;Patient/family education;Balance training;Splinting;Neuromuscular education;Ultrasound;Energy conservation;Therapeutic exercises;Therapeutic activities;DME and/or AE instruction;Parrafin;Cryotherapy;Electrical Stimulation;Fluidtherapy;Cognitive remediation/compensation;Visual/perceptual remediation/compensation;Passive range of motion;Contrast Bath;Moist Heat   Plan forced use of right UE - up on feet, increased independence- ADL, estim right hand/wrist   Consulted and Agree with Plan of Care Patient;Family member/caregiver   Family Member Consulted wife      Patient will benefit from skilled therapeutic intervention in order to improve the following deficits and impairments:  Decreased coordination, Decreased range of motion, Increased edema, Decreased safety awareness, Decreased endurance, Decreased activity tolerance, Decreased knowledge of precautions, Impaired tone, Obesity, Pain, Impaired UE functional use, Decreased knowledge of use of DME, Decreased balance, Decreased cognition, Decreased mobility, Decreased strength, Impaired perceived functional ability,  Impaired vision/preception  Visit Diagnosis: Unsteadiness on feet  Muscle weakness (generalized)  Other lack of coordination  Hemiplegia and hemiparesis following cerebral infarction affecting right non-dominant side [facility])    Problem List Patient Active Problem List   Diagnosis Date Noted  . Slow transit constipation   . Neuropathic pain   . Stroke, acute, thrombotic ([place]) 10/08/2015  . Hemiparesis, aphasia, and dysphagia as late effect of cerebrovascular accident (CVA) ([place]) 10/08/2015  . Essential hypertension 10/08/2015  . HLD (hyperlipidemia) 10/08/2015  . Hemiplegia and hemiparesis following cerebral infarction affecting right dominant side ([place])   . Right hemiplegia ([place]) 10/06/2015  . Stroke (cerebrum) (  [place]) 10/05/2015  . CVA (cerebral infarction) 10/05/2015    Mariah Milling, OTR/L 07/12/2016, 5:28 PM  [facility] [address] [place], [place], [address] Phone: [phone]   Fax:  [phone]  Name: Sean Wilkerson MRN: [id number] Date of Birth: Sep 11, 1946

## 2016-07-15 ENCOUNTER — Ambulatory Visit: Payer: Medicare Other | Admitting: Occupational Therapy

## 2016-07-15 ENCOUNTER — Encounter: Payer: Self-pay | Admitting: Physical Therapy

## 2016-07-15 ENCOUNTER — Encounter: Payer: Self-pay | Admitting: Occupational Therapy

## 2016-07-15 ENCOUNTER — Ambulatory Visit: Payer: Medicare Other | Admitting: Physical Therapy

## 2016-07-15 DIAGNOSIS — R2681 Unsteadiness on feet: Secondary | ICD-10-CM

## 2016-07-15 DIAGNOSIS — M6281 Muscle weakness (generalized): Secondary | ICD-10-CM

## 2016-07-15 DIAGNOSIS — I69353 Hemiplegia and hemiparesis following cerebral infarction affecting right non-dominant side: Secondary | ICD-10-CM

## 2016-07-15 DIAGNOSIS — R2689 Other abnormalities of gait and mobility: Secondary | ICD-10-CM

## 2016-07-15 DIAGNOSIS — R278 Other lack of coordination: Secondary | ICD-10-CM

## 2016-07-15 NOTE — Therapy (Signed)
[facility] [address] [place], [place], [address] Phone: [phone]   Fax:  [phone]  Physical Therapy Treatment  Patient Details  Name: Sean Wilkerson MRN: [id number] Date of Birth: 30-Oct-1945 Referring Provider: Dr. Antony Contras  Encounter Date: 07/15/2016      PT End of Session - 07/15/16 1519    Visit Number 37   Number of Visits 41   Date for PT Re-Evaluation 07/28/16   Authorization Type UHC Medicare   PT Start Time 1937   PT Stop Time 1402   PT Time Calculation (min) 45 min   Equipment Utilized During Treatment Gait belt   Activity Tolerance Patient tolerated treatment well;Patient limited by fatigue   Behavior During Therapy [facility] for tasks assessed/performed      Past Medical History:  Diagnosis Date  . HLD (hyperlipidemia) 10/08/2015  . HTN (hypertension) 10/08/2015  . Hypertension   . Stroke [facility])     Past Surgical History:  Procedure Laterality Date  . NO PAST SURGERIES      There were no vitals filed for this visit.      Subjective Assessment - 07/15/16 1316    Subjective Pt reports he has been practicing getting in to and out of the shower at home and "it's going well".  He also states that he needs more practice to "help get the weak leg over".     Patient is accompained by: Family member   Pertinent History L CVA 10-05-15 - hospitalized until Jan - D/C'd home with [place], [place] finished up on 02-25-16   Patient Stated Goals "I want to be totally independent and I want to walk longer and further."   Currently in Pain? No/denies   Pain Onset Today      Gait:  Pt ambulated 3x140f and 2x332fwith SBQC and supervision/minA (when fatigued) indoor, outdoor, pavement, ramp, and curb to incr activity tolerance and functional mobility to allow pt to ambulate safely in the community.  Verbal cuing needed to decr R LE step length and incr L LE step length,  maintain upright posture throughout, and incr L hip/knee flexion to ensure foot clearance from ground.  Noted foot catching and decr step length overall when pt fatigued.  Verbal cuing also needed for sequencing and to maintain momentum when stepping up on a curb.  Pt demonstrated LOB when stepping down due to stepping with wrong foot requiring modA from PT to maintain balance.  Self Care:  Pt negotiated simulated bathroom at his home including sit to stand from simulated toilet (18" chair with no arm rests) in order to improve functional mobility and incr independence at home and at church/restaurants.  Pt required verbal cues for sequencing/ hand foot placement when navigating small space.  Pt also given alternate strategies to perform sit > stand if no grab bars available.  Pt was able to perform sit > stand without UE support, but was instructed to use grab bars if available.                              PT Education - 07/15/16 1518    Education provided Yes   Education Details Pt instructed to go out to dinner with his wife ove rthe weekend using RW and report back during the following visit and inform PT on what was difficult, challenging, etc.   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 (Target Date: 07/23/16)   Time 4   Period Weeks   Status New     PT SHORT TERM GOAL #2   Title Pt will ambulate with LRAD 264f with supervision outdoors in order to go out to eat with his wife.   Time 4   Period Weeks   Status New     PT SHORT TERM GOAL #3   Title Pt will be able to perform sit to stand with supervision from the lowest mat table height to enable pt go to the bathroom using standard toilet with supervision.   Time 4   Period Weeks    Status New     PT SHORT TERM GOAL #4   Title Pt will be independent with continuing HEP and fitness plan to maintain progress made in therapy and good health.   Time 4   Period Weeks   Status New           PT Long Term Goals - 07/02/16 1714      PT LONG TERM GOAL #1   Title Modified independent with basic transfers.  Modified Target Date: 06/24/16)   Baseline met 06/25/16 with RW, R HO and RAFO   Time 8   Period Weeks   Status Achieved     PT LONG TERM GOAL #2   Title Modified independent with bed mobility.  (Modified Target Date: 06/24/16)   Baseline met 06/25/16 (states is doing at home by himself without brace)   Time 8   Period Weeks   Status Achieved     PT LONG TERM GOAL #3   Title Amb. 100' with LRAD with S for incr. household amb.  (Modified Target Date: 06/24/16)   Baseline met 06/25/16 at S level with RW, R HO, R AFO   Time 8   Period Weeks   Status Achieved  and revised distance     PT LONG TERM GOAL #4   Title Amb. 247 with hemiwalker with CGA for incr. community accessibility.  (Modified Target Date: 06/24/16)   Baseline 07/02/16:  Pt ambulated 1029fon unlevel, paved surfaces [facility].   Time 8   Period Weeks   Status Partially Met     PT LONG TERM GOAL #5   Title Negotiate steps (4) with S with use of hand rail .  (Modified Target Date: 06/24/16)   Baseline GOAL MET: 07/02/16   Time 8   Period Weeks   Status Achieved     PT LONG TERM GOAL #6   Title Perform Berg balance test when appropriate and establish goal as needed.  (05-04-16)   Baseline Will likely not be appropriate due to fall risk without device.    Time 8   Period Weeks   Status Deferred               Plan - 07/15/16 1520    Clinical Impression Statement Pt verbalized a desire to work on practicing entering/exiting bathroom at home as well as sitting on toilet.  Pt demonstrated good control and ability to navigate simulated bathroom and performed sit to stand from 18"  chair with no arm rests without using  either UE.  He continues to show progess as he was also able to ambulate distances comparable to that of a parking lot to restaurant.  Pt was able to negotiate curb outdoors with min-modA when fatigued, however, this skill could use more practice.  Pt was encouraged to go to a restaurant with his wife using the RW for added stability and report back what he found difficult during the process.  He continues to make gains toward his LTGs and would benefit from skilled PT to address his remianing concerns and deficits.   Rehab Potential Good   PT Frequency 2x / week   PT Duration 4 weeks   PT Treatment/Interventions ADLs/Self Care Home Management;DME Instruction;Balance training;Therapeutic exercise;Therapeutic activities;Functional mobility training;Stair training;Gait training;Neuromuscular re-education;Patient/family education;Orthotic Fit/Training;Passive range of motion   PT Next Visit Plan Gait indoors/outdoors increasing distance and activity tolerance with SBQC, assess his dinner outing using RW, work toward LTGs/personal goals   PT Home Exercise Plan RLE strengthening   Consulted and Agree with Plan of Care Patient      Patient will benefit from skilled therapeutic intervention in order to improve the following deficits and impairments:  Abnormal gait, Decreased activity tolerance, Decreased balance, Decreased coordination, Decreased endurance, Decreased mobility, Decreased range of motion, Decreased strength, Impaired tone, Impaired UE functional use, Obesity  Visit Diagnosis: Other abnormalities of gait and mobility  Unsteadiness on feet  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 Slow transit constipation   . Neuropathic pain   . Stroke, acute, thrombotic ([place]) 10/08/2015  . Hemiparesis, aphasia, and dysphagia as late  effect of cerebrovascular accident (CVA) ([place]) 10/08/2015  . Essential hypertension 10/08/2015  . HLD (hyperlipidemia) 10/08/2015  . Hemiplegia and hemiparesis following cerebral infarction affecting right dominant side ([place])   . Right hemiplegia ([place]) 10/06/2015  . Stroke (cerebrum) ([place]) 10/05/2015  . CVA (cerebral infarction) 10/05/2015    Katherine Mantle 07/15/2016, 5:23 PM  [facility] [address] [place], [place], [address] Phone: [phone]   Fax:  [phone]  Name: Sohrab Keelan MRN: [id number] Date of Birth: 29-Oct-1945

## 2016-07-15 NOTE — Therapy (Signed)
[facility] [address] [place], [place], [address] Phone: [phone]   Fax:  [phone]  Occupational Therapy Treatment  Patient Details  Name: Sean Wilkerson MRN: [id number] Date of Birth: 05/01/1946 Referring Provider: Dr. Leonie Man  Encounter Date: 07/15/2016      OT End of Session - 07/15/16 1525    Visit Number 36   Number of Visits 40   Date for OT Re-Evaluation 08/05/16   Authorization Type UMR MCR   Authorization Time Period g code and PN every 10th visit   Authorization - Visit Number 57   Authorization - Number of Visits 40   OT Start Time 7680   OT Stop Time 1446   OT Time Calculation (min) 43 min   Activity Tolerance Patient tolerated treatment well   Behavior During Therapy [facility] for tasks assessed/performed      Past Medical History:  Diagnosis Date  . HLD (hyperlipidemia) 10/08/2015  . HTN (hypertension) 10/08/2015  . Hypertension   . Stroke [facility])     Past Surgical History:  Procedure Laterality Date  . NO PAST SURGERIES      There were no vitals filed for this visit.      Subjective Assessment - 07/15/16 1517    Subjective  Why'd everything go out of my arm?   Pertinent History see Epic, L CVA   Patient Stated Goals to be more indpendent   Currently in Pain? No/denies   Pain Score 0-No pain                      OT Treatments/Exercises (OP) - 07/15/16 0001      ADLs   Bathing Reviewed shower transfer with patient and wife at end of session.  Patinet indicates transfer in went well, wife indicates transfer out was more challenging.  Will practice various techniques to determine safest method next visit.     Neurological Re-education Exercises   Other Exercises 1 Neuromuscular reeducation while estim applied to address wrist flexion and extension, digit flexion and extension as needed for grasp/release.  Patient able to activate at lower intensity in both flexion  and extension today, but unable to replicate any movement following estim     Acupuncturist Stimulation Location right wrist and digits -    Electrical Stimulation Action flex/ext   Electrical Stimulation Parameters on/off 10 sec, rate 50 pps, symmetrical, synchronous, ramp up 2 sec, pm 250, 10 min flex, 10 min ext   Electrical Stimulation Goals Neuromuscular facilitation                OT Education - 07/15/16 1525    Education provided Yes   Education Details remaining visits, plan for shower transfer   Person(s) Educated Patient;Spouse   Methods Explanation   Comprehension Verbalized understanding          OT Short Term Goals - 07/02/16 1705      OT SHORT TERM GOAL #1   Title I with inital HEP.- check 04/03/16   Status Achieved     OT SHORT TERM GOAL #2   Title Pt will demonstrate understanding of RUE positioning to minimize pain and risk for injury including splint wear PRN.   Status Achieved     OT SHORT TERM GOAL #3   Title Pt will consistently donn shirt with min A.   Status Achieved     OT SHORT TERM GOAL #4   Title Pt will perform  toilet transfers with min A.   Status Achieved     OT SHORT TERM GOAL #5   Title Pt will donn pants with min A using AE PRN.   Status Achieved     OT SHORT TERM GOAL #6   Title Pt will  perform shower transfer and bathe with min A.- 05/27/2016   Status Achieved           OT Long Term Goals - 07/02/16 1705      OT LONG TERM GOAL #1   Title Pt will donn shirt with set up only.- check 06/24/2016   Status Achieved     OT LONG TERM GOAL #2   Title Pt will donn pants with minguard for balance.   Status Achieved     OT LONG TERM GOAL #3   Title Pt will donn socks and shoes with min A.   Status Partially Met     OT LONG TERM GOAL #4   Title Pt will use RUE as a stabilizer for ADLS at least 25 % of the time with pain less than or equal to 3/10.   Status On-going     OT LONG TERM GOAL #5   Title  Pt will demonstrate P/ROM shoulder flexion to 100* with pain no greater than 3/10.   Status Achieved     OT LONG TERM GOAL #6   Title Pt will demonstrate ability to stand at countertop and retrieve an item from midlevel shelf with LUE, with supervision and no LOB.   Status Achieved               Plan - 07/15/16 1525    Clinical Impression Statement Patient progressing with increased independence with ADL due to imporved mobility   Rehab Potential Good   Clinical Impairments Affecting Rehab Potential Potential for depression- will monitor and offer support, resources as warranted; severity of deficits   OT Frequency 2x / week   OT Duration 8 weeks   OT Treatment/Interventions Self-care/ADL training;Therapeutic exercise;Patient/family education;Balance training;Splinting;Neuromuscular education;Ultrasound;Energy conservation;Therapeutic exercises;Therapeutic activities;DME and/or AE instruction;Parrafin;Cryotherapy;Electrical Stimulation;Fluidtherapy;Cognitive remediation/compensation;Visual/perceptual remediation/compensation;Passive range of motion;Contrast Bath;Moist Heat   Plan shower transfer - stepping out, NMR RUE   Consulted and Agree with Plan of Care Patient;Family member/caregiver   Family Member Consulted wife      Patient will benefit from skilled therapeutic intervention in order to improve the following deficits and impairments:  Decreased coordination, Decreased range of motion, Increased edema, Decreased safety awareness, Decreased endurance, Decreased activity tolerance, Decreased knowledge of precautions, Impaired tone, Obesity, Pain, Impaired UE functional use, Decreased knowledge of use of DME, Decreased balance, Decreased cognition, Decreased mobility, Decreased strength, Impaired perceived functional ability, Impaired vision/preception  Visit Diagnosis: Unsteadiness on feet  Muscle weakness (generalized)  Other lack of coordination  Hemiplegia and  hemiparesis following cerebral infarction affecting right non-dominant side [facility]    Problem List Patient Active Problem List   Diagnosis Date Noted  . Slow transit constipation   . Neuropathic pain   . Stroke, acute, thrombotic ([place]) 10/08/2015  . Hemiparesis, aphasia, and dysphagia as late effect of cerebrovascular accident (CVA) ([place]) 10/08/2015  . Essential hypertension 10/08/2015  . HLD (hyperlipidemia) 10/08/2015  . Hemiplegia and hemiparesis following cerebral infarction affecting right dominant side ([place])   . Right hemiplegia ([place]) 10/06/2015  . Stroke (cerebrum) ([place]) 10/05/2015  . CVA (cerebral infarction) 10/05/2015    Mariah Milling, OTR/L 07/15/2016, 3:27 PM  [facility] [address]  [place], [place], [address] Phone: [phone]   Fax:  [phone]  Name: Sean Wilkerson MRN: [id number] Date of Birth: 11-19-1945

## 2016-07-20 ENCOUNTER — Ambulatory Visit: Payer: Medicare Other | Admitting: Occupational Therapy

## 2016-07-20 ENCOUNTER — Ambulatory Visit: Payer: Medicare Other | Attending: Neurology | Admitting: Physical Therapy

## 2016-07-20 ENCOUNTER — Encounter: Payer: Self-pay | Admitting: Occupational Therapy

## 2016-07-20 VITALS — BP 134/75 | HR 65

## 2016-07-20 DIAGNOSIS — M6281 Muscle weakness (generalized): Secondary | ICD-10-CM

## 2016-07-20 DIAGNOSIS — R278 Other lack of coordination: Secondary | ICD-10-CM | POA: Insufficient documentation

## 2016-07-20 DIAGNOSIS — R2689 Other abnormalities of gait and mobility: Secondary | ICD-10-CM | POA: Diagnosis present

## 2016-07-20 DIAGNOSIS — I69353 Hemiplegia and hemiparesis following cerebral infarction affecting right non-dominant side: Secondary | ICD-10-CM

## 2016-07-20 DIAGNOSIS — R2681 Unsteadiness on feet: Secondary | ICD-10-CM | POA: Diagnosis present

## 2016-07-20 NOTE — Therapy (Signed)
[facility] [address] [place], [place], [address] Phone: [phone]   Fax:  [phone]  Physical Therapy Treatment  Patient Details  Name: Sean Wilkerson MRN: [id number] Date of Birth: 12/19/1945 Referring Provider: Dr. Antony Contras  Encounter Date: 07/20/2016      PT End of Session - 07/20/16 1643    Visit Number 38   Number of Visits 41   Date for PT Re-Evaluation 07/28/16   Authorization Type [employer] Medicare   PT Start Time 1400   PT Stop Time 1444   PT Time Calculation (min) 44 min   Equipment Utilized During Treatment Gait belt   Activity Tolerance Patient limited by fatigue  Standing/ambulation tolerance limited by fatigue to 3.5 to 4 minutes.   Behavior During Therapy [facility] for tasks assessed/performed      Past Medical History:  Diagnosis Date  . HLD (hyperlipidemia) 10/08/2015  . HTN (hypertension) 10/08/2015  . Hypertension   . Stroke [facility])     Past Surgical History:  Procedure Laterality Date  . NO PAST SURGERIES      Vitals:   07/20/16 1431  BP: 134/75  Pulse: 65        Subjective Assessment - 07/20/16 1406    Subjective Pt reports he hasn't had a chance to go out to eat yet. Reports he as able to ambulate into the smaller bathroom at home and got up from the toilet without help.   Pertinent History L CVA 10-05-15 - hospitalized until Jan - D/C'd home with [place] finished up on 02-25-16   Patient Stated Goals "I want to be totally independent and I want to walk longer and further."   Currently in Pain? No/denies                         [facility] Adult PT Treatment/Exercise - 07/20/16 0001      Ambulation/Gait   Ambulation/Gait Yes   Ambulation/Gait Assistance 4: Min guard;4: Min assist   Ambulation/Gait Assistance Details Gait x75' (from waiting room into therapy gym) with SBQC, R AFO, and min guard-min A due to intermittent anterior LOB  when pt attempting to advance RLE. Gait x80' with [facility] with cueing for QC placement (less anterior) to prevent forward flexed posture, which appears to increased difficulty of RLE advancement. Gait x10' without AD with min A, cueing for increased R step length (to transition from step-to to step-through pattern). At // bars, performed gait 4 x10' without UE support with use of mirror, verbal/tactile cueing for postural alignment, increased lateral weight shift to  R.   Ambulation Distance (Feet) 205 Feet  total   Assistive device Small based quad cane;Parallel bars;None;Other (Comment)  R AFO   Gait Pattern Decreased step length - left;Decreased stance time - right;Decreased hip/knee flexion - right;Decreased weight shift to right;Step-through pattern;Trunk rotated posteriorly on right;Trunk flexed   Ambulation Surface Level;Indoor     Neuro Re-ed    Neuro Re-ed Details  Pt engaged in games of tic-tac-toe and hangman while standing with intermittent LUE support at // bars with min guard for stability/balance, manual facilitation of lateral weight shift to R side (especially when pt removed LUE from // bars to write on mirror). 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 (Target Date: 07/23/16)   Time 4   Period Weeks   Status On-going     PT SHORT TERM GOAL #2   Title Pt will ambulate with LRAD 272f with supervision outdoors in order to go out to eat with his wife.   Time 4   Period Weeks   Status On-going     PT SHORT TERM GOAL #3   Title Pt will be able to perform sit to stand with supervision from the lowest mat table height to enable pt go to the bathroom  using standard toilet with supervision.   Time 4   Period Weeks   Status On-going     PT SHORT TERM GOAL #4   Title Pt will be independent with continuing HEP and fitness plan to maintain progress made in therapy and good health.   Time 4   Period Weeks   Status On-going           PT Long Term Goals - 07/02/16 1714      PT LONG TERM GOAL #1   Title Modified independent with basic transfers.  Modified Target Date: 06/24/16)   Baseline met 06/25/16 with RW, R HO and RAFO   Time 8   Period Weeks   Status Achieved     PT LONG TERM GOAL #2   Title Modified independent with bed mobility.  (Modified Target Date: 06/24/16)   Baseline met 06/25/16 (states is doing at home by himself without brace)   Time 8   Period Weeks   Status Achieved     PT LONG TERM GOAL #3   Title Amb. 100' with LRAD with S for incr. household amb.  (Modified Target Date: 06/24/16)   Baseline met 06/25/16 at S level with RW, R HO, R AFO   Time 8   Period Weeks   Status Achieved  and revised distance     PT LONG TERM GOAL #4   Title Amb. 244 with hemiwalker with CGA for incr. community accessibility.  (Modified Target Date: 06/24/16)   Baseline 07/02/16:  Pt ambulated 1044fon unlevel, paved surfaces with [facility] supervision.   Time 8   Period Weeks   Status Partially Met     PT LONG TERM GOAL #5   Title Negotiate steps (4) with S with use of hand rail .  (Modified Target Date: 06/24/16)   Baseline GOAL MET: 07/02/16   Time 8   Period Weeks   Status Achieved     PT LONG TERM GOAL #6   Title Perform Berg balance test when appropriate and establish goal as needed.  (05-04-16)   Baseline Will likely not be appropriate due to fall risk without device.    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 Pt able to  engage in dynamic standing task for no more than 4 minutes consecutively due to fatigue. Pt continues to require frequent reminders to breathe consistently during standing/ambulation.    Rehab Potential Good   PT Frequency  2x / week   PT Duration 4 weeks   PT Treatment/Interventions ADLs/Self Care Home Management;DME Instruction;Balance training;Therapeutic exercise;Therapeutic activities;Functional mobility training;Stair training;Gait training;Neuromuscular re-education;Patient/family education;Orthotic Fit/Training;Passive range of motion   PT Next Visit Plan Continue to address LTG's for standing tolerance, ambulation with SBQC, obstacle negotiation, and standing from low surfaces. Focus on quality of movement (RLE WB during transitional movements and dynamic standing).   PT Home Exercise Plan RLE strengthening   Consulted and Agree with Plan of Care Patient      Patient will benefit from skilled therapeutic intervention in order to improve the following deficits and impairments:  Abnormal gait, Decreased activity tolerance, Decreased balance, Decreased coordination, Decreased endurance, Decreased mobility, Decreased range of motion, Decreased strength, Impaired tone, Impaired UE functional use, Obesity  Visit Diagnosis: Hemiplegia and hemiparesis following cerebral infarction affecting right non-dominant side (HCC)  Other abnormalities of gait and mobility  Unsteadiness on feet     Problem List Patient Active Problem List   Diagnosis Date Noted  . Slow transit constipation   . Neuropathic pain   . Stroke, acute, thrombotic 10/08/2015  . Hemiparesis, aphasia, and dysphagia as late effect of cerebrovascular accident (CVA) ([place]) 10/08/2015  . Essential hypertension 10/08/2015  . HLD (hyperlipidemia) 10/08/2015  . Hemiplegia and hemiparesis following cerebral infarction affecting right dominant side ([place])   . Right hemiplegia ([place]) 10/06/2015  . Stroke (cerebrum) ([place]) 10/05/2015   . CVA (cerebral infarction) 10/05/2015    Billie Ruddy, PT, DPT [facility] [address] [place], [place], [address] Phone: [phone]   Fax:  [phone] 07/20/16, 4:48 PM  Name: Sy Saintjean MRN: [id number] Date of Birth: Sep 12, 1946

## 2016-07-20 NOTE — Therapy (Signed)
[facility] [address] [place], [place], [address] Phone: [phone]   Fax:  [phone]  Occupational Therapy Treatment  Patient Details  Name: Sean Wilkerson MRN: [id number] Date of Birth: [birth year] Referring Provider: Dr. Leonie Man  Encounter Date: 07/20/2016      OT End of Session - 07/20/16 1558    Visit Number 37   Number of Visits 40   Date for OT Re-Evaluation 08/05/16   Authorization Type UMR MCR   Authorization Time Period g code and PN every 10th visit   Authorization - Visit Number 30   Authorization - Number of Visits 40   OT Start Time 6962   OT Stop Time 1530   OT Time Calculation (min) 45 min   Activity Tolerance Patient tolerated treatment well   Behavior During Therapy [facility] for tasks assessed/performed      Past Medical History:  Diagnosis Date  . HLD (hyperlipidemia) 10/08/2015  . HTN (hypertension) 10/08/2015  . Hypertension   . Stroke [facility])     Past Surgical History:  Procedure Laterality Date  . NO PAST SURGERIES      There were no vitals filed for this visit.      Subjective Assessment - 07/20/16 1547    Subjective  My energy level is just low.     Patient is accompained by: Family member   Pertinent History see Epic, L CVA   Patient Stated Goals to be more indpendent   Currently in Pain? No/denies   Pain Score 0-No pain                      OT Treatments/Exercises (OP) - 07/20/16 0001      ADLs   UB Dressing Patient and wife both indicate that patient could be more independnet in dressing himself.   Both indicated that buttoning a shirt is very frustrating for patient, and he requires assistance.  Demonstrated the button hook to increase independence with dressing.   Patient able to effectively button dress shirt size buttons with minimal assistance.  Let patient and wife know where they could purchase a button hook if desired.     Toileting Wife  indicated that patient was able to walk to the 1/2 bath, and needed no assistance for toilet transfer, clothing management, etc.  Patient and wife bith very pleased with increased independence.    Bathing Reviewed shower transfers, now that patient has transitioned to small based quad cane, wife had asked for a review especially of stepping out of shower.  Simulated patient's shower stall conditions,a nd removed AFO.  Patient able to step in backwards with left leg, and then sit on tub seat.  Patient able to step out of shower with right leg first, then use hand rail, and step out second with left leg.  Reviewed other options, e.g. side stepping into shower, although at this time, patient can complete backward / forward method the safeset and the most independently.  [name] present to observe this session.                  OT Education - 07/20/16 1558    Education provided Yes   Education Details shower transfers, button hook   Person(s) Educated Patient;Spouse   Methods Explanation;Demonstration   Comprehension Verbalized understanding;Returned demonstration          OT Short Term Goals - 07/02/16 1705      OT SHORT TERM GOAL #1  Title I with inital HEP.- check 04/03/16   Status Achieved     OT SHORT TERM GOAL #2   Title Pt will demonstrate understanding of RUE positioning to minimize pain and risk for injury including splint wear PRN.   Status Achieved     OT SHORT TERM GOAL #3   Title Pt will consistently donn shirt with min A.   Status Achieved     OT SHORT TERM GOAL #4   Title Pt will perform toilet transfers with min A.   Status Achieved     OT SHORT TERM GOAL #5   Title Pt will donn pants with min A using AE PRN.   Status Achieved     OT SHORT TERM GOAL #6   Title Pt will  perform shower transfer and bathe with min A.- 05/27/2016   Status Achieved           OT Long Term Goals - 07/02/16 1705      OT LONG TERM GOAL #1   Title Pt will donn shirt  with set up only.- check 06/24/2016   Status Achieved     OT LONG TERM GOAL #2   Title Pt will donn pants with minguard for balance.   Status Achieved     OT LONG TERM GOAL #3   Title Pt will donn socks and shoes with min A.   Status Partially Met     OT LONG TERM GOAL #4   Title Pt will use RUE as a stabilizer for ADLS at least 25 % of the time with pain less than or equal to 3/10.   Status On-going     OT LONG TERM GOAL #5   Title Pt will demonstrate P/ROM shoulder flexion to 100* with pain no greater than 3/10.   Status Achieved     OT LONG TERM GOAL #6   Title Pt will demonstrate ability to stand at countertop and retrieve an item from midlevel shelf with LUE, with supervision and no LOB.   Status Achieved               Plan - 07/20/16 1558    Clinical Impression Statement Patient continues to increaseindependence with ADL   Rehab Potential Good   Clinical Impairments Affecting Rehab Potential Potential for depression- will monitor and offer support, resources as warranted; severity of deficits   OT Frequency 2x / week   OT Duration 8 weeks   OT Treatment/Interventions Self-care/ADL training;Therapeutic exercise;Patient/family education;Balance training;Splinting;Neuromuscular education;Ultrasound;Energy conservation;Therapeutic exercises;Therapeutic activities;DME and/or AE instruction;Parrafin;Cryotherapy;Electrical Stimulation;Fluidtherapy;Cognitive remediation/compensation;Visual/perceptual remediation/compensation;Passive range of motion;Contrast Bath;Moist Heat   Plan NMR RUE, Estim   Consulted and Agree with Plan of Care Patient;Family member/caregiver   Family Member Consulted wife      Patient will benefit from skilled therapeutic intervention in order to improve the following deficits and impairments:  Decreased coordination, Decreased range of motion, Increased edema, Decreased safety awareness, Decreased endurance, Decreased activity tolerance, Decreased  knowledge of precautions, Impaired tone, Obesity, Pain, Impaired UE functional use, Decreased knowledge of use of DME, Decreased balance, Decreased cognition, Decreased mobility, Decreased strength, Impaired perceived functional ability, Impaired vision/preception  Visit Diagnosis: Unsteadiness on feet  Muscle weakness (generalized)  Other lack of coordination  Hemiplegia and hemiparesis following cerebral infarction affecting right non-dominant side [facility])    Problem List Patient Active Problem List   Diagnosis Date Noted  . Slow transit constipation   . Neuropathic pain   . Stroke, acute, thrombotic 10/08/2015  . Hemiparesis, aphasia, and  dysphagia as late effect of cerebrovascular accident (CVA) ([place]) 10/08/2015  . Essential hypertension 10/08/2015  . HLD (hyperlipidemia) 10/08/2015  . Hemiplegia and hemiparesis following cerebral infarction affecting right dominant side ([place])   . Right hemiplegia ([place]) 10/06/2015  . Stroke (cerebrum) ([place]) 10/05/2015  . CVA (cerebral infarction) 10/05/2015    Mariah Milling 07/20/2016, 4:00 PM  [facility] [address] [place], [place], [address] Phone: [phone]   Fax:  [phone]  Name: Sean Wilkerson MRN: [id number] Date of Birth: [birth year]

## 2016-07-23 ENCOUNTER — Ambulatory Visit: Payer: Medicare Other | Admitting: Occupational Therapy

## 2016-07-23 ENCOUNTER — Encounter: Payer: Self-pay | Admitting: Occupational Therapy

## 2016-07-23 ENCOUNTER — Ambulatory Visit: Payer: Medicare Other | Admitting: Physical Therapy

## 2016-07-23 DIAGNOSIS — R2689 Other abnormalities of gait and mobility: Secondary | ICD-10-CM

## 2016-07-23 DIAGNOSIS — I69353 Hemiplegia and hemiparesis following cerebral infarction affecting right non-dominant side: Secondary | ICD-10-CM

## 2016-07-23 DIAGNOSIS — R2681 Unsteadiness on feet: Secondary | ICD-10-CM

## 2016-07-23 DIAGNOSIS — R278 Other lack of coordination: Secondary | ICD-10-CM

## 2016-07-23 DIAGNOSIS — M6281 Muscle weakness (generalized): Secondary | ICD-10-CM

## 2016-07-23 NOTE — Therapy (Signed)
[facility] [address] [place], [place], [address] Phone: [phone]   Fax:  [phone]  Occupational Therapy Treatment  Patient Details  Name: Sean Wilkerson MRN: [id number] Date of Birth: June 28, 1946 Referring Provider: Dr. Leonie Man  Encounter Date: 07/23/2016      OT End of Session - 07/23/16 1649    Visit Number 38   Number of Visits 40   Date for OT Re-Evaluation 08/05/16   Authorization Type UMR MCR   Authorization Time Period g code and PN every 10th visit   Authorization - Visit Number 35   Authorization - Number of Visits 40   OT Start Time 1315   OT Stop Time 1400   OT Time Calculation (min) 45 min   Activity Tolerance Patient tolerated treatment well   Behavior During Therapy [facility] for tasks assessed/performed      Past Medical History:  Diagnosis Date  . HLD (hyperlipidemia) 10/08/2015  . HTN (hypertension) 10/08/2015  . Hypertension   . Stroke [facility])     Past Surgical History:  Procedure Laterality Date  . NO PAST SURGERIES      There were no vitals filed for this visit.      Subjective Assessment - 07/23/16 1642    Subjective  How am I going to make it without you guys?   Pertinent History see Epic, L CVA   Patient Stated Goals to be more indpendent   Currently in Pain? No/denies   Pain Score 0-No pain                      OT Treatments/Exercises (OP) - 07/23/16 1644      ADLs   UB Dressing Patient needed min assist to orient overhead tshirt, to don.  Patient able to don front opening shirt and button without assistance.       Neurological Re-education Exercises   Other Exercises 1 Neuromuscular reeducation to address postural control.  Used kinesiotape to influence more upright posture during walking, and to encourage right arm at side versus in front of body.  Patient able to walk more upright with cueing for cane placement, and with kinesiotape.       Modalities   Modalities Social worker Location right wrist and digits -    Electrical Stimulation Action wrist / digit flex/ext   Electrical Stimulation Parameters on/off 10 sec, rate 50 pps,  ramp up 2 sec, , pw 250, 10 min   Electrical Stimulation Goals Neuromuscular facilitation                OT Education - 07/23/16 1648    Education provided Yes   Education Details postural control, taping for posture - wear and care   Person(s) Educated Patient   Methods Explanation   Comprehension Verbalized understanding;Verbal cues required          OT Short Term Goals - 07/02/16 1705      OT SHORT TERM GOAL #1   Title I with inital HEP.- check 04/03/16   Status Achieved     OT SHORT TERM GOAL #2   Title Pt will demonstrate understanding of RUE positioning to minimize pain and risk for injury including splint wear PRN.   Status Achieved     OT SHORT TERM GOAL #3   Title Pt will consistently donn shirt with min A.   Status Achieved     OT SHORT  TERM GOAL #4   Title Pt will perform toilet transfers with min A.   Status Achieved     OT SHORT TERM GOAL #5   Title Pt will donn pants with min A using AE PRN.   Status Achieved     OT SHORT TERM GOAL #6   Title Pt will  perform shower transfer and bathe with min A.- 05/27/2016   Status Achieved           OT Long Term Goals - 07/02/16 1705      OT LONG TERM GOAL #1   Title Pt will donn shirt with set up only.- check 06/24/2016   Status Achieved     OT LONG TERM GOAL #2   Title Pt will donn pants with minguard for balance.   Status Achieved     OT LONG TERM GOAL #3   Title Pt will donn socks and shoes with min A.   Status Partially Met     OT LONG TERM GOAL #4   Title Pt will use RUE as a stabilizer for ADLS at least 25 % of the time with pain less than or equal to 3/10.   Status On-going     OT LONG TERM GOAL #5   Title Pt will demonstrate P/ROM  shoulder flexion to 100* with pain no greater than 3/10.   Status Achieved     OT LONG TERM GOAL #6   Title Pt will demonstrate ability to stand at countertop and retrieve an item from midlevel shelf with LUE, with supervision and no LOB.   Status Achieved               Plan - 07/23/16 1649    Clinical Impression Statement Patient is preparing for OT discharge   Rehab Potential Good   Clinical Impairments Affecting Rehab Potential Potential for depression- will monitor and offer support, resources as warranted; severity of deficits   OT Frequency 2x / week   OT Duration 8 weeks   OT Treatment/Interventions Self-care/ADL training;Therapeutic exercise;Patient/family education;Balance training;Splinting;Neuromuscular education;Ultrasound;Energy conservation;Therapeutic exercises;Therapeutic activities;DME and/or AE instruction;Parrafin;Cryotherapy;Electrical Stimulation;Fluidtherapy;Cognitive remediation/compensation;Visual/perceptual remediation/compensation;Passive range of motion;Contrast Bath;Moist Heat   Plan NMR RUE AND Postural control, check kinesiotape   Consulted and Agree with Plan of Care Patient      Patient will benefit from skilled therapeutic intervention in order to improve the following deficits and impairments:  Decreased coordination, Decreased range of motion, Increased edema, Decreased safety awareness, Decreased endurance, Decreased activity tolerance, Decreased knowledge of precautions, Impaired tone, Obesity, Pain, Impaired UE functional use, Decreased knowledge of use of DME, Decreased balance, Decreased cognition, Decreased mobility, Decreased strength, Impaired perceived functional ability, Impaired vision/preception  Visit Diagnosis: Hemiplegia and hemiparesis following cerebral infarction affecting right non-dominant side (HCC)  Unsteadiness on feet  Muscle weakness (generalized)  Other lack of coordination    Problem List Patient Active Problem  List   Diagnosis Date Noted  . Slow transit constipation   . Neuropathic pain   . Stroke, acute, thrombotic 10/08/2015  . Hemiparesis, aphasia, and dysphagia as late effect of cerebrovascular accident (CVA) ([place]) 10/08/2015  . Essential hypertension 10/08/2015  . HLD (hyperlipidemia) 10/08/2015  . Hemiplegia and hemiparesis following cerebral infarction affecting right dominant side ([place])   . Right hemiplegia ([place]) 10/06/2015  . Stroke (cerebrum) ([place]) 10/05/2015  . CVA (cerebral infarction) 10/05/2015    Mariah Milling, OTR/L 07/23/2016, 4:51 PM  [facility] [address] [place], [place], [address] Phone:  [phone]   Fax:  [phone]  Name: Bynum Mccullars MRN: [id number] Date of Birth: 12/02/1945

## 2016-07-24 NOTE — Therapy (Signed)
[place] [address] [place], [place], [address] Phone: [phone]   Fax:  [phone]  Physical Therapy Treatment  Patient Details  Name: Sean Wilkerson MRN: [id number] Date of Birth: 04/07/1946 Referring Provider: Dr. Antony Contras  Encounter Date: 07/23/2016      PT End of Session - 07/24/16 1010    Visit Number 39   Number of Visits 41   Date for PT Re-Evaluation 07/28/16   Authorization Type UHC Medicare   PT Start Time 1406   PT Stop Time 1445   PT Time Calculation (min) 39 min   Equipment Utilized During Treatment Gait belt   Activity Tolerance Patient limited by fatigue   Behavior During Therapy [facility] for tasks assessed/performed      Past Medical History:  Diagnosis Date  . HLD (hyperlipidemia) 10/08/2015  . HTN (hypertension) 10/08/2015  . Hypertension   . Stroke [facility])     Past Surgical History:  Procedure Laterality Date  . NO PAST SURGERIES      There were no vitals filed for this visit.      Subjective Assessment - 07/23/16 1410    Subjective "I was planning to go to the [employer] today, but [name] didn't think it would be a good idea after therapy."   Pertinent History L CVA 10-05-15 - hospitalized until Jan - D/C'd home with [place], [place] [facility] finished up on 02-25-16   Patient Stated Goals "I want to be totally independent and I want to walk longer and further."   Currently in Pain? 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 (Target Date: 07/28/16)   Time 4   Period Weeks   Status On-going     PT LONG TERM GOAL #2   Title Pt will ambulate with LRAD 233f with  supervision outdoors in order to go out to eat with his wife.   Time 4   Period Weeks   Status On-going     PT LONG TERM GOAL #3   Title Pt will be able to perform sit to stand with supervision from the lowest mat table height to enable pt go to the bathroom using standard toilet with supervision.   Baseline Met 10/6.   Time 4   Period Weeks   Status Achieved     PT LONG TERM GOAL #4   Title Pt will be independent with continuing HEP and fitness plan to maintain progress made in therapy and good health.   Time 4   Period Weeks   Status On-going     PT LONG TERM GOAL #6   Title     Baseline                 Plan - 07/24/16 1016    Clinical Impression Statement Session focused on continued gait training with/without SBQC, transfer training, and NMR with emphasis on midline  orientation/postural control during stepping. Pt met goal addressing sit <> stand from lowest mat table height without UE use. Standing/ambulation tolerance continues to be limited by pt fatigue.    Rehab Potential Good   PT Frequency 2x / week   PT Duration 4 weeks   PT Treatment/Interventions ADLs/Self Care Home Management;DME Instruction;Balance training;Therapeutic exercise;Therapeutic activities;Functional mobility training;Stair training;Gait training;Neuromuscular re-education;Patient/family education;Orthotic Fit/Training;Passive range of motion   PT Next Visit Plan Begin assessing LTG's for standing tolerance, ambulation with SBQC, obstacle negotiation. Consider addressing ongoing community fitness goal (? gym in patient's hometown) at next session.    PT Home Exercise Plan RLE strengthening   Consulted and Agree with Plan of Care Patient      Patient will benefit from skilled therapeutic intervention in order to improve the following deficits and impairments:  Abnormal gait, Decreased activity tolerance, Decreased balance, Decreased coordination, Decreased endurance, Decreased mobility, Decreased  range of motion, Decreased strength, Impaired tone, Impaired UE functional use, Obesity  Visit Diagnosis: Hemiplegia and hemiparesis following cerebral infarction affecting right non-dominant side (HCC)  Unsteadiness on feet  Other abnormalities of gait and mobility     Problem List Patient Active Problem List   Diagnosis Date Noted  . Slow transit constipation   . Neuropathic pain   . Stroke, acute, thrombotic 10/08/2015  . Hemiparesis, aphasia, and dysphagia as late effect of cerebrovascular accident (CVA) ([place]) 10/08/2015  . Essential hypertension 10/08/2015  . HLD (hyperlipidemia) 10/08/2015  . Hemiplegia and hemiparesis following cerebral infarction affecting right dominant side ([place])   . Right hemiplegia ([place]) 10/06/2015  . Stroke (cerebrum) ([place]) 10/05/2015  . CVA (cerebral infarction) 10/05/2015   Billie Ruddy, PT, DPT [facility] [address] [place], [place], [address] Phone: [phone]   Fax:  [phone] 07/24/16, 10:25 AM    Name: Sean Wilkerson MRN: [id number] Date of Birth: 24-Nov-1945

## 2016-07-27 ENCOUNTER — Ambulatory Visit: Payer: Medicare Other | Admitting: Occupational Therapy

## 2016-07-27 ENCOUNTER — Encounter: Payer: Self-pay | Admitting: Occupational Therapy

## 2016-07-27 ENCOUNTER — Ambulatory Visit: Payer: Medicare Other | Admitting: Physical Therapy

## 2016-07-27 ENCOUNTER — Encounter: Payer: Self-pay | Admitting: Physical Therapy

## 2016-07-27 VITALS — BP 155/66 | HR 66

## 2016-07-27 DIAGNOSIS — R2681 Unsteadiness on feet: Secondary | ICD-10-CM

## 2016-07-27 DIAGNOSIS — M6281 Muscle weakness (generalized): Secondary | ICD-10-CM

## 2016-07-27 DIAGNOSIS — I69353 Hemiplegia and hemiparesis following cerebral infarction affecting right non-dominant side: Secondary | ICD-10-CM

## 2016-07-27 DIAGNOSIS — R2689 Other abnormalities of gait and mobility: Secondary | ICD-10-CM

## 2016-07-27 DIAGNOSIS — R278 Other lack of coordination: Secondary | ICD-10-CM

## 2016-07-27 NOTE — Therapy (Signed)
[place] [address] [place], [place], [address] Phone: [phone]   Fax:  [phone]  Physical Therapy Treatment  Patient Details  Name: Sean Wilkerson MRN: [id number] Date of Birth: 1945-12-07 Referring Provider: Dr. Antony Contras  Encounter Date: 07/27/2016      PT End of Session - 07/27/16 1630    Visit Number 40   Number of Visits 41   Date for PT Re-Evaluation 07/28/16   Authorization Type UHC Medicare   PT Start Time 1017   PT Stop Time 1100   PT Time Calculation (min) 43 min   Equipment Utilized During Treatment Gait belt   Activity Tolerance Patient tolerated treatment well;Patient limited by fatigue   Behavior During Therapy [facility] for tasks assessed/performed      Past Medical History:  Diagnosis Date  . HLD (hyperlipidemia) 10/08/2015  . HTN (hypertension) 10/08/2015  . Hypertension   . Stroke [facility])     Past Surgical History:  Procedure Laterality Date  . NO PAST SURGERIES      Vitals:   07/27/16 1048  BP: (!) 155/66  Pulse: 66        Subjective Assessment - 07/27/16 1017    Subjective Pt reports he wants to "get set up with a program at home" in order to maintain his progress in therapy.  He also states he has been using the [facility] more and "trying to get used to it."   Patient is accompained by: Family member   Pertinent History L CVA 10-05-15 - hospitalized until Jan - D/C'd home with [place], [place] finished up on 02-25-16   Patient Stated Goals "I want to be totally independent and I want to walk longer and further."   Currently in Pain? No/denies   Pain Onset Today   Multiple Pain Sites No                         [facility] Adult PT Treatment/Exercise - 07/27/16 1015      Transfers   Transfers Sit to Stand;Stand to [employer] Transfers   Sit to Stand 5: Supervision;With upper extremity assist;From bed;With armrests;From chair/3-in-1    Sit to Stand Details (indicate cue type and reason) Pt performed 1x5 sit<>stands from mat table at lowest elevation with verbal cuing needed to prevent forward lean to the L when performing sit<>stand and to incr use of R LE.  2" step was used under the L foot to further encourage incr muscle activation of the R LE.     Ambulation/Gait   Ambulation/Gait Yes   Ambulation/Gait Assistance 5: Supervision;4: Min guard   Ambulation/Gait Assistance Details Pt required verbal cuing to maintain upright posture throughout activity and to continue breathing.  Pt reported muscle fatigue that required him to sit before meeting the 250' goal.  No SOB.   Ambulation Distance (Feet) 185 Feet  1x185', 1x70', 1x60'   Assistive device Small based quad cane   Gait Pattern Step-through pattern;Decreased arm swing - right;Decreased step length - left;Decreased stance time - right;Decreased hip/knee flexion - right;Decreased weight shift to right;Lateral trunk lean to left;Trunk flexed;Narrow base of support;Poor foot clearance - right   Ambulation Surface Level;Unlevel;Indoor;Outdoor;Paved     Neuro Re-ed    Neuro Re-ed Details  Pt performed 1x7 sit<>stands with 2" block under pt's L foot to incr muscle activation and weight shift to the R LE.  Verbal and tactile cuing needed  to facilitate incr weight shift to the R during both sit and stand as well as to slow descent instead of "flopping" to the table.                PT Education - 07/27/16 1200    Education provided Yes   Education Details Pt educated on using RW when going out to eat for added stability while continuing to use [facility] when at home.   Person(s) Educated Patient   Methods Explanation   Comprehension Verbalized understanding          PT Short Term Goals - 07/24/16 1016      PT SHORT TERM GOAL #1   Title STG's = LTG's    Baseline     Time --   Period --   Status --     PT SHORT TERM GOAL #2   Title     Baseline     Time --    Period --   Status --     PT SHORT TERM GOAL #3   Title     Baseline     Time --   Period --   Status --     PT SHORT TERM GOAL #4   Title     Baseline     Time --   Period --   Status --     PT SHORT TERM GOAL #5   Title     Baseline             PT Long Term Goals - 07/27/16 1637      PT LONG TERM GOAL #1   Title 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 (Target Date: 07/28/16)   Time 4   Period Weeks   Status On-going     PT LONG TERM GOAL #2   Title Pt will ambulate with LRAD 277f with supervision outdoors in order to go out to eat with his wife.   Baseline 185' with SBQC indoors and outdoors over pavement and ramp. (07/27/16)   Time 4   Period Weeks   Status On-going     PT LONG TERM GOAL #3   Title Pt will be able to perform sit to stand with supervision from the lowest mat table height to enable pt go to the bathroom using standard toilet with supervision.   Baseline Met 10/6.   Time 4   Period Weeks   Status Achieved     PT LONG TERM GOAL #4   Title Pt will be independent with continuing HEP and fitness plan to maintain progress made in therapy and good health.   Time 4   Period Weeks   Status On-going     PT LONG TERM GOAL #6   Title     Baseline                 Plan - 07/27/16 1631    Clinical Impression Statement Pt able to make good progress towards his amulation goal of walking distances in the community that would allow him to e able to go out to eat with his wife.  He states that the muscles in his LEs get tired and "give out" before he becomes SOB indicating a need for imrpovement in muscle endurance.  This will be addressed in the pt HEP and on-going fitness plan before discharge.  He demonstrates improved sit<>stand capability althogh requiring extensive cuing  for weight shift corrections and potural alignment.  He continues to require cuing to orrect gait deviations involving a L  lat trunk lean and decr weight shift to the R that limits him in community ambulation.  Pt is scheduled to be d/c'ed next visit and will be given an extensive HEP and fitness plan to continue towards his goals.   Rehab Potential Good   PT Frequency 2x / week   PT Duration 4 weeks   PT Treatment/Interventions ADLs/Self Care Home Management;DME Instruction;Balance training;Therapeutic exercise;Therapeutic activities;Functional mobility training;Stair training;Gait training;Neuromuscular re-education;Patient/family education;Orthotic Fit/Training;Passive range of motion   PT Next Visit Plan Assess remaining LTGs and develop HEP/fitness plan for continued progress   PT Home Exercise Plan RLE strengthening   Consulted and Agree with Plan of Care Patient      Patient will benefit from skilled therapeutic intervention in order to improve the following deficits and impairments:  Abnormal gait, Decreased activity tolerance, Decreased balance, Decreased coordination, Decreased endurance, Decreased mobility, Decreased range of motion, Decreased strength, Impaired tone, Impaired UE functional use, Obesity  Visit Diagnosis: Hemiplegia and hemiparesis following cerebral infarction affecting right non-dominant side (HCC)  Unsteadiness on feet  Other abnormalities of gait and mobility  Muscle weakness (generalized)     Problem List Patient Active Problem List   Diagnosis Date Noted  . Slow transit constipation   . Neuropathic pain   . Stroke, acute, thrombotic 10/08/2015  . Hemiparesis, aphasia, and dysphagia as late effect of cerebrovascular accident (CVA) ([place]) 10/08/2015  . Essential hypertension 10/08/2015  . HLD (hyperlipidemia) 10/08/2015  . Hemiplegia and hemiparesis following cerebral infarction affecting right dominant side ([place])   . Right hemiplegia ([place]) 10/06/2015  . Stroke (cerebrum) ([place]) 10/05/2015  . CVA (cerebral infarction) 10/05/2015    Katherine Mantle, SPT 07/27/2016,  4:40 PM  [place] [address] [place], [place], [address] Phone: [phone]   Fax:  [phone]  Name: Greogory Cornette MRN: [id number] Date of Birth: [birth year]

## 2016-07-27 NOTE — Therapy (Signed)
[facility] [address] [place], [place], [address] Phone: [phone]   Fax:  [phone]  Occupational Therapy Treatment  Patient Details  Name: Sean Wilkerson MRN: [id number] Date of Birth: 09-10-1946 Referring Provider: Dr. Pearlean Brownie  Encounter Date: 07/27/2016      OT End of Session - 07/27/16 1255    Visit Number 39   Number of Visits 40   Date for OT Re-Evaluation 08/05/16   Authorization Type UMR MCR   Authorization Time Period g code and PN every 10th visit   Authorization - Visit Number 39   Authorization - Number of Visits 40   OT Start Time 1103   OT Stop Time 1146   OT Time Calculation (min) 43 min   Equipment Utilized During Treatment kinesiotape   Activity Tolerance Patient tolerated treatment well   Behavior During Therapy [facility] for tasks assessed/performed      Past Medical History:  Diagnosis Date  . HLD (hyperlipidemia) 10/08/2015  . HTN (hypertension) 10/08/2015  . Hypertension   . Stroke [facility])     Past Surgical History:  Procedure Laterality Date  . NO PAST SURGERIES      There were no vitals filed for this visit.      Subjective Assessment - 07/27/16 1243    Subjective  What do I need to focus on when this is done?    Patient Stated Goals to be more indpendent   Currently in Pain? No/denies   Pain Score 0-No pain                      OT Treatments/Exercises (OP) - 07/27/16 1246      ADLs   Toileting Patient is now consistently independent with toileting!   Functional Mobility Patient is now consistently walking with small based quad cane, and supervision / verbal cueing for postural control during gait.  Patient with tendency to place cane too far in front of him causing him to flex forward iat his hips.  Patient responds well to cueing for more upright postural control during walking.       Manual Therapy   Kinesiotex Facilitate Muscle;Inhibit Muscle                 OT Education - 07/27/16 1254    Education provided Yes   Education Details postural control, taping for posture   Person(s) Educated Patient   Methods Explanation;Demonstration;Tactile cues;Verbal cues   Comprehension Verbalized understanding;Need further instruction          OT Short Term Goals - 07/02/16 1705      OT SHORT TERM GOAL #1   Title I with inital HEP.- check 04/03/16   Status Achieved     OT SHORT TERM GOAL #2   Title Pt will demonstrate understanding of RUE positioning to minimize pain and risk for injury including splint wear PRN.   Status Achieved     OT SHORT TERM GOAL #3   Title Pt will consistently donn shirt with min A.   Status Achieved     OT SHORT TERM GOAL #4   Title Pt will perform toilet transfers with min A.   Status Achieved     OT SHORT TERM GOAL #5   Title Pt will donn pants with min A using AE PRN.   Status Achieved     OT SHORT TERM GOAL #6   Title Pt will  perform shower transfer and bathe with min A.-  05/27/2016   Status Achieved           OT Long Term Goals - Aug 11, 2016 1257      OT LONG TERM GOAL #1   Title Pt will donn shirt with set up only.- check 06/24/2016   Status Achieved     OT LONG TERM GOAL #2   Title Pt will donn pants with minguard for balance.   Status Achieved     OT LONG TERM GOAL #3   Title Pt will donn socks and shoes with min A.   Status Partially Met     OT LONG TERM GOAL #4   Title Pt will use RUE as a stabilizer for ADLS at least 25 % of the time with pain less than or equal to 3/10.   Status On-going     OT LONG TERM GOAL #5   Title Pt will demonstrate P/ROM shoulder flexion to 100* with pain no greater than 3/10.   Status Achieved     OT LONG TERM GOAL #6   Title Pt will demonstrate ability to stand at countertop and retrieve an item from midlevel shelf with LUE, with supervision and no LOB.   Status Achieved               Plan - Aug 11, 2016 1256    Clinical  Impression Statement Plan to discharge patient at next visit.    Rehab Potential Good   Clinical Impairments Affecting Rehab Potential Potential for depression- will monitor and offer support, resources as warranted; severity of deficits   OT Frequency 2x / week   OT Duration 8 weeks   OT Treatment/Interventions Self-care/ADL training;Therapeutic exercise;Patient/family education;Balance training;Splinting;Neuromuscular education;Ultrasound;Energy conservation;Therapeutic exercises;Therapeutic activities;DME and/or AE instruction;Parrafin;Cryotherapy;Electrical Stimulation;Fluidtherapy;Cognitive remediation/compensation;Visual/perceptual remediation/compensation;Passive range of motion;Contrast Bath;Moist Heat   Plan review remaining goals, educate wife in taping, creat list of jobs for right arm   Consulted and Agree with Plan of Care Patient      Patient will benefit from skilled therapeutic intervention in order to improve the following deficits and impairments:  Decreased coordination, Decreased range of motion, Increased edema, Decreased safety awareness, Decreased endurance, Decreased activity tolerance, Decreased knowledge of precautions, Impaired tone, Obesity, Pain, Impaired UE functional use, Decreased knowledge of use of DME, Decreased balance, Decreased cognition, Decreased mobility, Decreased strength, Impaired perceived functional ability, Impaired vision/preception  Visit Diagnosis: Hemiplegia and hemiparesis following cerebral infarction affecting right non-dominant side (HCC)  Unsteadiness on feet  Muscle weakness (generalized)  Other lack of coordination      G-Codes - 08-11-2016 1259    Functional Assessment Tool Used clinical impresssions /ADL status   Functional Limitation Self care   Self Care Current Status (E2683) At least 20 percent but less than 40 percent impaired, limited or restricted   Self Care Goal Status (M1962) At least 20 percent but less than 40 percent  impaired, limited or restricted      Problem List Patient Active Problem List   Diagnosis Date Noted  . Slow transit constipation   . Neuropathic pain   . Stroke, acute, thrombotic 10/08/2015  . Hemiparesis, aphasia, and dysphagia as late effect of cerebrovascular accident (CVA) ([place]) 10/08/2015  . Essential hypertension 10/08/2015  . HLD (hyperlipidemia) 10/08/2015  . Hemiplegia and hemiparesis following cerebral infarction affecting right dominant side ([place])   . Right hemiplegia ([place]) 10/06/2015  . Stroke (cerebrum) ([place]) 10/05/2015  . CVA (cerebral infarction) 10/05/2015    Mariah Milling, OTR/L 2016/08/11, 1:00 PM   [facility] [address] [place], [place], [address] Phone: [phone]   Fax:  [phone]  Name: Rance Smithson MRN: [id number] Date of Birth: [birth year]

## 2016-07-28 ENCOUNTER — Ambulatory Visit: Payer: Medicare Other | Admitting: Occupational Therapy

## 2016-07-28 ENCOUNTER — Encounter: Payer: Self-pay | Admitting: Physical Therapy

## 2016-07-28 ENCOUNTER — Ambulatory Visit: Payer: Medicare Other | Admitting: Physical Therapy

## 2016-07-28 DIAGNOSIS — I69353 Hemiplegia and hemiparesis following cerebral infarction affecting right non-dominant side: Secondary | ICD-10-CM

## 2016-07-28 DIAGNOSIS — R2689 Other abnormalities of gait and mobility: Secondary | ICD-10-CM

## 2016-07-28 DIAGNOSIS — M6281 Muscle weakness (generalized): Secondary | ICD-10-CM

## 2016-07-28 DIAGNOSIS — R2681 Unsteadiness on feet: Secondary | ICD-10-CM

## 2016-07-28 NOTE — Therapy (Signed)
[place] [address] [place], [place], [address] Phone: [phone]   Fax:  [phone]  Physical Therapy Treatment and Discharge Summary  Patient Details  Name: Sean Wilkerson MRN: [id number] Date of Birth: [birth year] Referring Provider: Dr. Antony Contras  Encounter Date: 07/28/2016      PT End of Session - 07/28/16 1639    Visit Number 41   Number of Visits 41   Date for PT Re-Evaluation 07/28/16   Authorization Type U[employer] Medicare   PT Start Time 1150   PT Stop Time 1232   PT Time Calculation (min) 42 min   Equipment Utilized During Treatment Gait belt   Activity Tolerance Patient tolerated treatment well;Patient limited by fatigue   Behavior During Therapy [facility] for tasks assessed/performed      Past Medical History:  Diagnosis Date  . HLD (hyperlipidemia) 10/08/2015  . HTN (hypertension) 10/08/2015  . Hypertension   . Stroke [facility])     Past Surgical History:  Procedure Laterality Date  . NO PAST SURGERIES      There were no vitals filed for this visit.      Subjective Assessment - 07/28/16 1200    Subjective Pt reports no issues or falls since last visit.  He states that when he walks or stands for longer distances/times, he gets "sore in both hips."   Pertinent History L CVA 10-05-15 - hospitalized until Jan - D/C'd home with [place], [place] finished up on 02-25-16   Patient Stated Goals "I want to be totally independent and I want to walk longer and further."   Currently in Pain? No/denies   Pain Onset Today   Multiple Pain Sites No                         [facility] Adult PT Treatment/Exercise - 07/28/16 1145      Transfers   Transfers Sit to Stand;Stand to [employer] Transfers   Sit to Stand 5: Supervision;4: Min guard;Without upper extremity assist;From bed;With upper extremity assist;From chair/3-in-1;With armrests   Stand to Sit 5: Supervision;4:  Min guard;With upper extremity assist;Without upper extremity assist;With armrests;To bed;To chair/3-in-1   Stand to Sit Details Verbal cuing needed to instruct pt to fully turn around before attempting to sit, especially when fatigued in order to avoid falling.   Stand Pivot Transfers 5: Supervision;4: Min guard;With armrests     Ambulation/Gait   Ambulation/Gait Yes   Ambulation/Gait Assistance 5: Supervision;4: Min guard   Ambulation Distance (Feet) 200 Feet  1x200' 2x100', 1x30'   Assistive device Small based quad cane   Gait Pattern Step-through pattern;Decreased arm swing - right;Decreased step length - left;Decreased stance time - right;Decreased hip/knee flexion - right;Decreased dorsiflexion - right;Decreased weight shift to right;Lateral trunk lean to left;Trunk flexed;Narrow base of support;Poor foot clearance - right   Ambulation Surface Level;Unlevel;Indoor;Outdoor;Paved   Curb 5: Supervision;4: Min assist   Curb Details (indicate cue type and reason) Verbal cuing asking pt to verbalize proper steps to descend and ascend curb.  MinA needed with minor post LOB when initially stepping up on curb during first trial.     Self-Care   Self-Care Other Self-Care Comments   Other Self-Care Comments  Discussed with pt and wife various options for gym memeberships in area including activities offered (machines, walking track, pool, Silver Sneakers, etc.) in order for pt to continue fitness plan and improve strength and endurance in  LEs.       Exercises   Other Exercises  Pt performed 3 min on Nustep to instruct pt and wife on proper set up for use at a gym.  Verbal cuing to use R LE more than L to incr muscle activation and improve strength and endurance in LE musculature.                PT Education - 07/28/16 1638    Education provided Yes   Education Details Pt and wife educated on post d/c plans and given options for gym memberships in area to further progress.   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 (Target Date: 07/28/16)   Baseline Pt able to stand 12 min with intermittent B UE support. - 07/28/16   Time 4   Period Weeks   Status Partially Met     PT LONG TERM GOAL #2   Title Pt will ambulate with LRAD 244f with supervision outdoors in order to go out to eat with his wife.   Baseline 185' with SBQC indoors and outdoors over pavement and ramp. (07/27/16)   Time 4   Period Weeks   Status Partially Met     PT LONG TERM GOAL #3   Title Pt will be able to perform sit to stand with supervision from the lowest mat table height to enable pt go to the bathroom using standard toilet with supervision.   Baseline Met 10/6.   Time 4   Period Weeks   Status Achieved     PT LONG TERM GOAL #4   Title Pt will be independent with continuing HEP and fitness plan to maintain progress made in therapy and good health.   Baseline GOAL MET: 07/28/16   Time 4   Period Weeks   Status Achieved     PT LONG TERM GOAL #6   Title     Baseline                 Plan - 07/28/16 1641    Clinical Impression Statement Pt has demonstrated significant improvement since initially begining PT in regards to his strength, functional mobility, and balance.  Although he was unable to meet some of his LTGs, the improvement that he has made has significantly impacted his QOL and caused him to incr his desire to improve on his own through his HEP and fitness plan at a local gym.  Pt is able to ambulate distances that allow him to attend church and go out to eat with his wife,  both of which were personal goals of his.  PT discussed with pt and his wife the plan of continuing exercises on his own for a period of time with the possibility of returning for more PT at a later date.  Pt and his wife both agreed to the plan that was discussed and pt will be d/c after today's visit.   Rehab  Potential Good   PT Frequency 2x / week   PT Duration 4 weeks   PT Treatment/Interventions ADLs/Self Care Home Management;DME Instruction;Balance training;Therapeutic exercise;Therapeutic activities;Functional mobility training;Stair training;Gait training;Neuromuscular re-education;Patient/family education;Orthotic Fit/Training;Passive range of motion   PT Home Exercise Plan RLE strengthening   Consulted and Agree with Plan of Care Patient;Family member/caregiver   Family Member Consulted spouse Melene Muller      Patient will benefit from skilled therapeutic intervention in order to improve the following deficits and impairments:  Abnormal gait, Decreased activity tolerance, Decreased balance, Decreased coordination, Decreased endurance, Decreased mobility, Decreased range of motion, Decreased strength, Impaired tone, Impaired UE functional use, Obesity  Visit Diagnosis: Hemiplegia and hemiparesis following cerebral infarction affecting right non-dominant side (HCC)  Muscle weakness (generalized)  Unsteadiness on feet  Other abnormalities of gait and mobility      G-Codes - 08/07/2016 1638    Functional Assessment Tool Used Clinical judgement:  Mod I bed mobility; supervision transfers and household ambulation with SBQC; min guard community mobility with [facility]   Functional Limitation Mobility: Walking and moving around   Mobility: Walking and Moving Around Goal Status [phone]) At least 20 percent but less than 40 percent impaired, limited or restricted   Mobility: Walking and Moving Around Discharge Status [phone]) At least 20 percent but less than 40 percent impaired, limited or restricted        Problem List Patient Active Problem List   Diagnosis Date Noted  . Slow transit constipation   . Neuropathic pain   . Stroke, acute, thrombotic 10/08/2015  . Hemiparesis, aphasia, and dysphagia as late effect of cerebrovascular accident (CVA) ([place]) 10/08/2015  . Essential hypertension 10/08/2015  . HLD (hyperlipidemia) 10/08/2015  . Hemiplegia and hemiparesis following cerebral infarction affecting right dominant side ([place])   . Right hemiplegia ([place]) 10/06/2015  . Stroke (cerebrum) ([place]) 10/05/2015  . CVA (cerebral infarction) 10/05/2015    PHYSICAL THERAPY DISCHARGE SUMMARY  Visits from Start of Care: 41  Current functional level related to goals / functional outcomes: See above      Remaining deficits: See above    Education / Equipment: See above  Plan: Patient agrees to discharge.  Patient goals were partially met. Patient is being discharged due to being pleased with the current functional level.  ?????             Billie Ruddy, PT, DPT [facility] [address] [place], [place], [address] Phone: [phone]   Fax:  [phone] August 07, 2016, 5:13 PM   Name: Sean Wilkerson MRN: [id number] Date of Birth: 03/25/1946

## 2016-07-30 ENCOUNTER — Ambulatory Visit: Payer: Medicare Other | Admitting: Rehabilitation

## 2016-08-04 ENCOUNTER — Ambulatory Visit: Payer: Medicare Other | Admitting: Occupational Therapy

## 2016-08-04 ENCOUNTER — Ambulatory Visit: Payer: Medicare Other | Admitting: Physical Therapy

## 2016-08-04 DIAGNOSIS — R2681 Unsteadiness on feet: Secondary | ICD-10-CM

## 2016-08-04 DIAGNOSIS — I69353 Hemiplegia and hemiparesis following cerebral infarction affecting right non-dominant side: Secondary | ICD-10-CM

## 2016-08-04 DIAGNOSIS — M6281 Muscle weakness (generalized): Secondary | ICD-10-CM

## 2016-08-04 DIAGNOSIS — R278 Other lack of coordination: Secondary | ICD-10-CM

## 2016-08-04 NOTE — Patient Instructions (Signed)
Seated at table top - interlace fingers  Gently glide hands forward as far as you can stretch, back to center Then to the right, then to the left Think of completing all points on a rainbow.  Bend and straighten arm elbow, bringing hands toward your chin.  Bend and straighten your right wrist.    Complete these stretches every day at least once to prevent tightness / stiffness in your right arm.

## 2016-08-04 NOTE — Therapy (Signed)
[facility] [address] [place], [place], [address] Phone: [phone]   Fax:  [phone]  Occupational Therapy Treatment  Patient Details  Name: Sean Wilkerson MRN: [id number] Date of Birth: 1946/02/21 Referring Provider: Dr. Pearlean Brownie  Encounter Date: 08/04/2016      OT End of Session - 08/04/16 1424    Visit Number 40   Number of Visits 40   Date for OT Re-Evaluation 08/05/16   Authorization Type UMR MCR   Authorization Time Period g code and PN every 10th visit   Authorization - Visit Number 40   Authorization - Number of Visits 40   OT Start Time 1105   OT Stop Time 1150   OT Time Calculation (min) 45 min   Equipment Utilized During Treatment kinesiotape   Activity Tolerance Patient tolerated treatment well   Behavior During Therapy [facility] for tasks assessed/performed      Past Medical History:  Diagnosis Date  . HLD (hyperlipidemia) 10/08/2015  . HTN (hypertension) 10/08/2015  . Hypertension   . Stroke [facility])     Past Surgical History:  Procedure Laterality Date  . NO PAST SURGERIES      There were no vitals filed for this visit.      Subjective Assessment - 08/04/16 1413    Subjective  patient indicates he is ready for OT discharge   Patient is accompained by: Family member   Pertinent History see Epic, L CVA   Patient Stated Goals to be more indpendent   Currently in Pain? No/denies   Pain Score 0-No pain                      OT Treatments/Exercises (OP) - 08/04/16 0001      ADLs   UB Dressing Wife expressing surprise at patient's ability to don and doff upper body clothing without assistance.  Discussed at length the improtance of beginning to back off assistance where it isn't needed, e.g. toileting, dressing, etc.     LB Dressing Discussed with wife that patient is able to don and doff pants without assistance - wifehas been helping with this daily.       Neurological  Re-education Exercises   Other Exercises 1 Reviewed table top exercises (SROM)  to help maintain current joint mobility .  Reviewed aspects of functional tasks that patient can incorporate left arm into.       Manual Therapy   Kinesiotex Inhibit Muscle;Facilitate Muscle  educated patient and wife                OT Education - 08/04/16 1423    Education provided Yes   Education Details Reviewed OT goals, progress, and ADL Status, self range of motion program   Person(s) Educated Spouse;Patient   Methods Explanation;Demonstration   Comprehension Verbalized understanding;Returned demonstration          OT Short Term Goals - 08/04/16 1425      OT SHORT TERM GOAL #1   Title I with inital HEP.- check 04/03/16   Status Achieved     OT SHORT TERM GOAL #2   Title Pt will demonstrate understanding of RUE positioning to minimize pain and risk for injury including splint wear PRN.   Status Achieved     OT SHORT TERM GOAL #3   Title Pt will consistently donn shirt with min A.   Status Achieved     OT SHORT TERM GOAL #4   Title Pt will  perform toilet transfers with min A.   Status Achieved     OT SHORT TERM GOAL #5   Title Pt will donn pants with min A using AE PRN.   Status Achieved     OT SHORT TERM GOAL #6   Title Pt will  perform shower transfer and bathe with min A.- 05/27/2016   Status Achieved     OT SHORT TERM GOAL #7   Title Pt will tolerate P/ROM shoulder flexion to 90* with no significant increase in pain.   Status Achieved           OT Long Term Goals - 02-Sep-2016 1425      OT LONG TERM GOAL #1   Title Pt will donn shirt with set up only.- check 06/24/2016   Status Achieved     OT LONG TERM GOAL #2   Title Pt will donn pants with minguard for balance.   Status Achieved     OT LONG TERM GOAL #3   Title Pt will donn socks and shoes with min A.   Status Partially Met     OT LONG TERM GOAL #4   Title Pt will use RUE as a stabilizer for ADLS at least  25 % of the time with pain less than or equal to 3/10.   Status Partially Met     OT LONG TERM GOAL #5   Title Pt will demonstrate P/ROM shoulder flexion to 100* with pain no greater than 3/10.   Status Achieved     OT LONG TERM GOAL #6   Title Pt will demonstrate ability to stand at countertop and retrieve an item from midlevel shelf with LUE, with supervision and no LOB.   Status Achieved               Plan - 2016/09/02 1424    Clinical Impression Statement Patient has met most of his OT goals and is ready for and agreeable to discharge.     Rehab Potential Good   OT Frequency 2x / week   OT Duration 8 weeks   OT Treatment/Interventions Self-care/ADL training;Therapeutic exercise;Patient/family education;Balance training;Splinting;Neuromuscular education;Ultrasound;Energy conservation;Therapeutic exercises;Therapeutic activities;DME and/or AE instruction;Parrafin;Cryotherapy;Electrical Stimulation;Fluidtherapy;Cognitive remediation/compensation;Visual/perceptual remediation/compensation;Passive range of motion;Contrast Bath;Moist Heat   Plan discharge OT   Consulted and Agree with Plan of Care Patient;Family member/caregiver   Family Member Consulted wife      Patient will benefit from skilled therapeutic intervention in order to improve the following deficits and impairments:  Decreased coordination, Decreased range of motion, Increased edema, Decreased safety awareness, Decreased endurance, Decreased activity tolerance, Decreased knowledge of precautions, Impaired tone, Obesity, Pain, Impaired UE functional use, Decreased knowledge of use of DME, Decreased balance, Decreased cognition, Decreased mobility, Decreased strength, Impaired perceived functional ability, Impaired vision/preception  Visit Diagnosis: Hemiplegia and hemiparesis following cerebral infarction affecting right non-dominant side (HCC)  Muscle weakness (generalized)  Unsteadiness on feet  Other lack of  coordination      G-Codes - 2016/09/02 1426    Functional Assessment Tool Used clinical impresssions /ADL status   Functional Limitation Self care   Self Care Current Status ([id number]) At least 20 percent but less than 40 percent impaired, limited or restricted   Self Care Goal Status ([id number]) At least 20 percent but less than 40 percent impaired, limited or restricted   Self Care Discharge Status [phone]) At least 20 percent but less than 40 percent impaired, limited or restricted      Problem List Patient Active Problem List  Diagnosis Date Noted  . Slow transit constipation   . Neuropathic pain   . Stroke, acute, thrombotic 10/08/2015  . Hemiparesis, aphasia, and dysphagia as late effect of cerebrovascular accident (CVA) ([place]) 10/08/2015  . Essential hypertension 10/08/2015  . HLD (hyperlipidemia) 10/08/2015  . Hemiplegia and hemiparesis following cerebral infarction affecting right dominant side ([place])   . Right hemiplegia ([place]) 10/06/2015  . Stroke (cerebrum) ([place]) 10/05/2015  . CVA (cerebral infarction) 10/05/2015   OCCUPATIONAL THERAPY DISCHARGE SUMMARY  Visits from Start of Care: 40 Current functional level related to goals / functional outcomes: Patient requires only minimal intermittent assistance for ADL   Remaining deficits: Right hemiplegia, shoulder subluxation  Education / Equipment: HEP Plan: Patient agrees to discharge.  Patient goals were partially met. Patient is being discharged due to meeting the stated rehab goals.  ?????      Mariah Milling , OTR/L 08/04/2016, 2:27 PM  [place] [address] [place], [place], [address] Phone: [phone]   Fax:  [phone]  Name: Sean Wilkerson MRN: [id number] Date of Birth: 1946/04/30

## 2016-10-21 ENCOUNTER — Ambulatory Visit: Payer: Medicare Other | Admitting: Adult Health

## 2016-10-25 ENCOUNTER — Ambulatory Visit (INDEPENDENT_AMBULATORY_CARE_PROVIDER_SITE_OTHER): Payer: Medicare Other | Admitting: Adult Health

## 2016-10-25 ENCOUNTER — Encounter: Payer: Self-pay | Admitting: Adult Health

## 2016-10-25 VITALS — BP 130/72 | HR 66 | Ht 69.0 in | Wt 246.0 lb

## 2016-10-25 DIAGNOSIS — R202 Paresthesia of skin: Secondary | ICD-10-CM | POA: Diagnosis not present

## 2016-10-25 DIAGNOSIS — Z8673 Personal history of transient ischemic attack (TIA), and cerebral infarction without residual deficits: Secondary | ICD-10-CM

## 2016-10-25 NOTE — Progress Notes (Signed)
PATIENT: Sean BirchwoodLarry Lee Wilkerson DOB: 19-Jan-1946  REASON FOR VISIT: follow up- stroke HISTORY FROM: patient  HISTORY OF PRESENT ILLNESS: Today 10/25/16: Sean Wilkerson is a 71 year old male with a history of stroke. He returns today for follow-up. He continues on aspirin and is tolerating it well. Denies any significant bruising or bleeding. Patient reports that he followed up with his primary care December 4. Reports that his Crestor was increased at that visit. The patient is not diabetic. Does not smoke cigarettes. He finished rehabilitation in October. He uses a cane to ambulate and a wheelchair for longer distances. Wife reports that he had one fall in November. She reports that he lost his balance. Fortunately he did not suffer any injuries. He continues to have limited mobility on the right side. The patient denies being depressed but does report that his emotions can be "up and down." Patient also notes trouble with his energy level. He reports that there are times that he just feels "wiped out." He reports that they did decrease his gabapentin to 2 tablets twice a day. The patient did not have any recurrence of nerve pain with this decrease. He returns today for an evaluation.   HISTORY 04/14/16: Sean Wilkerson is a 71 year old male with a history of stroke in the summer 2016. He returns today for follow-up. He is currently taking aspirin and tolerating it well. He denies any significant bruising or bleeding. The patient's blood pressure is in normal range. His primary care has been managing his cholesterol. Wife reports that he has a follow-up visit in August. 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 He states with rehabilitation he has been able to regain some movement of the right arm. He is currently participating in outpatient rehabilitation. He states that this is going very well. The patient is on gabapentin for nerve related pain. His wife reports that this was started in  the hospital however he has not had any discomfort recently. The wife is questioning if he needs to stay on this medication. He returns today for an evaluation.  HISTORY 01/14/16 (sethI): [age] year male seen today for first office follow-up visit following hospital admission for stroke in December 2016. Sean Wilkerson is an 71 y.o. male with a history of hypertension, obesity and syncopal spells, brought to the ED and code stroke status following acute onset of slurred speech along with weakness and numbness involving his right side. Patient was last known well at 7:30 PM 10/05/15. He had no history of stroke nor TIA. He's been taking aspirin 81 mg per day. CT scan of his head showed no acute intracranial abnormality. NIH stroke score was 7. Patient was deemed a candidate for intravenous thrombolytic therapy with TPA, which was administered. Patient was subsequently admitted to neuro intensive care unit for further management. Patient has been experiencing a couple spells of unclear etiology and is currently wearing a long-term cardiac monitor. IV TPA 90 mg Sunday 10/05/2015 at 2045 LSN: 7:30 PM on 10/05/2015 tPA Given: Yes patient was admitted to the intensive care unit where blood pressure was tightly controlled with close neurological monitoring. 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 MRA of the brain showed no large vessel occlusion or stenosis but mild atherosclerotic changes. LDL cholesterol was elevated at 135. Hemoglobin A1c was 6.0. Transthoracic echo showed normal ejection fraction without cardiac source  of embolism. Carotid Doppler showed no significant extracranial stenosis. Patient's neurological exam actually worsened within the NIH scale score 7 on admission became-13 at the time of discharge. He was seen by physical occupational therapy and felt to be a good  patient for rehabilitation and he finished her rehabilitation and is currently at home. He is doing home physical and occupation therapy. Is able to walk with a walker with a foot brace. He can feed himself and needs some help with transfers and with toilet. Patient states he did not tolerate Lipitor due to fear of side effects without actually having them and stopped it after a month. He is tolerating aspirin well without bruising or bleeding. Continues to have dense right hemiplegia with no movements practically   REVIEW OF SYSTEMS: Out of a complete 14 system review of symptoms, the patient complains only of the following symptoms, and all other reviewed systems are negative.  Fatigue  ALLERGIES: No Known Allergies  HOME MEDICATIONS: Outpatient Medications Prior to Visit  Medication Sig Dispense Refill  . amLODipine (NORVASC) 10 MG tablet Take 1 tablet (10 mg total) by mouth daily. 30 tablet 1  . aspirin 81 MG tablet Take 81 mg by mouth daily.    . carvedilol (COREG) 25 MG tablet Take 1 tablet (25 mg total) by mouth 2 (two) times daily with a meal. 60 tablet 1  . diphenhydramine-acetaminophen (TYLENOL PM) 25-500 MG TABS tablet Take 1 tablet by mouth at bedtime.    . gabapentin (NEURONTIN) 400 MG capsule Take 2 capsules (800 mg total) by mouth 3 (three) times daily. 90 capsule 1  . methocarbamol (ROBAXIN) 500 MG tablet Take 1 tablet (500 mg total) by mouth every 6 (six) hours as needed for muscle spasms. 90 tablet 0  . Multiple Vitamin (MULTIVITAMIN WITH MINERALS) TABS tablet Take 1 tablet by mouth daily.    . nortriptyline (PAMELOR) 10 MG capsule Take 1 capsule (10 mg total) by mouth at bedtime. (Patient taking differently: Take 10 mg by mouth 3 times/day as needed-between meals & bedtime. ) 30 capsule 1  . rosuvastatin (CRESTOR) 5 MG tablet Take 0.5 tablets (2.5 mg total) by mouth daily. 30 tablet 2   No facility-administered medications prior to visit.     PAST MEDICAL HISTORY: Past  Medical History:  Diagnosis Date  . HLD (hyperlipidemia) 10/08/2015  . HTN (hypertension) 10/08/2015  . Hypertension   . Stroke [facility])     PAST SURGICAL HISTORY: Past Surgical History:  Procedure Laterality Date  . NO PAST SURGERIES      FAMILY HISTORY: Family History  Problem Relation Age of Onset  . Stroke Brother     SOCIAL HISTORY: Social History   Social History  . Marital status: Single    Spouse name: N/A  . Number of children: N/A  . Years of education: N/A   Occupational History  . Not on file.   Social History Main Topics  . Smoking status: Never Smoker  . Smokeless tobacco: Never Used  . Alcohol use No  . Drug use: No  . Sexual activity: Not on file   Other Topics Concern  . Not on file   Social History Narrative  . No narrative on file      PHYSICAL EXAM  Vitals:   10/25/16 1256  BP: 130/72  Pulse: 66  Weight: 246 lb (111.6 kg)  Height: 5\' 9"  (1.753 m)   There is no height or weight on file to calculate BMI.  Generalized: Well  developed, in no acute distress   Neurological examination  Mentation: Alert oriented to time, place, history taking. Follows all commands speech and language fluent Cranial nerve II-XII: Pupils were equal round reactive to light. Extraocular movements were full, visual field were full on confrontational test. Facial sensation and strength were normal. Uvula tongue midline. Head turning and shoulder shrug  were normal and symmetric. Motor: The motor testing reveals 5 over 5 strength in the left upper and lower extremity. 0-1 strength in the right upper and lower should be.  Sensory: Sensory testing is intact to soft touch on all 4 extremities. No evidence of extinction is noted.  Coordination: Cerebellar testing reveals good finger-nose-finger and heel-to-shin on the left. Gait and station: Patient is in a wheelchair. Reflexes: Deep tendon reflexes are symmetric and normal bilaterally.   DIAGNOSTIC DATA (LABS,  IMAGING, TESTING) - I reviewed patient records, labs, notes, testing and imaging myself where available.  Lab Results  Component Value Date   WBC 5.7 11/18/2015   HGB 12.9 (L) 11/18/2015   HCT 39.3 11/18/2015   MCV 98.0 11/18/2015   PLT 226 11/18/2015      Component Value Date/Time   NA 141 11/18/2015 2255   K 4.1 11/18/2015 2255   CL 107 11/18/2015 2255   CO2 24 11/18/2015 2255   GLUCOSE 184 (H) 11/18/2015 2255   BUN 13 11/18/2015 2255   CREATININE 0.86 11/18/2015 2255   CALCIUM 9.3 11/18/2015 2255   PROT 6.5 10/09/2015 0456   ALBUMIN 3.3 (L) 10/09/2015 0456   AST 25 10/09/2015 0456   ALT 29 10/09/2015 0456   ALKPHOS 72 10/09/2015 0456   BILITOT 0.7 10/09/2015 0456   GFRNONAA >60 11/18/2015 2255   GFRAA >60 11/18/2015 2255   Lab Results  Component Value Date   CHOL 205 (H) 10/06/2015   HDL 44 10/06/2015   LDLCALC 135 (H) 10/06/2015   TRIG 131 10/06/2015   CHOLHDL 4.7 10/06/2015   Lab Results  Component Value Date   HGBA1C 6.0 (H) 10/06/2015     ASSESSMENT AND PLAN 71 y.o. year old male  has a past medical history of HLD (hyperlipidemia) (10/08/2015); HTN (hypertension) (10/08/2015); Hypertension; and Stroke [facility]). here with:  1. History of stroke 2. Paresthesia  Patient will continue on aspirin for stroke prevention. He should maintain strict control of his blood pressure with goal less than 130/90. Cholesterol LDL less than 70. Hemoglobin A1c less than 6.5%. The patient does report decrease energy level some days. We will try to wean the patient off of gabapentin. He will decrease to 1 tablet twice a day for 2 weeks, if tolerating he can then decrease to 1 tablet daily for one week, then discontinue the medication. If his fatigue/energy level does not improve he should let us know. He will follow-up in 6 months with Dr. Pearlean BrownieSethi.     Butch PennyMegan Jionni Helming, MSN, NP-C 10/25/2016, 1:06 PM [facility] [address], [address] [place], [place] [id number] [phone]  [phone]

## 2016-10-25 NOTE — Patient Instructions (Signed)
Continue Aspirin Monitor BP goal<130/90 CHolesterol LDL <70 HbgA1c <6.5 % Decrease Gabapentin to 1 tablet twice a day for two weeks if tolerating decrease to 1 tablet daily for 1 week then stop medication If your symptoms worsen or you develop new symptoms please let us know.

## 2016-10-31 NOTE — Progress Notes (Signed)
I reviewed above note and agree with the assessment and plan.  Marvel PlanJindong Nemesis Rainwater, MD PhD Stroke Neurology 10/31/2016 12:07 AM

## 2017-05-02 ENCOUNTER — Encounter: Payer: Self-pay | Admitting: Neurology

## 2017-05-02 ENCOUNTER — Ambulatory Visit (INDEPENDENT_AMBULATORY_CARE_PROVIDER_SITE_OTHER): Payer: Medicare Other | Admitting: Neurology

## 2017-05-02 VITALS — BP 130/75 | HR 69 | Wt 225.0 lb

## 2017-05-02 DIAGNOSIS — G8110 Spastic hemiplegia affecting unspecified side: Secondary | ICD-10-CM | POA: Diagnosis not present

## 2017-05-02 NOTE — Progress Notes (Signed)
PATIENT: Sean BirchwoodLarry Lee Wilkerson DOB: [birth year]  REASON FOR VISIT: follow up- stroke HISTORY FROM: patient  HISTORY OF PRESENT ILLNESS: Today 10/25/16: Sean Wilkerson is a 71 year old male with a history of stroke. He returns today for follow-up. He continues on aspirin and is tolerating it well. Denies any significant bruising or bleeding. Patient reports that he followed up with his primary care December 4. Reports that his Crestor was increased at that visit. The patient is not diabetic. Does not smoke cigarettes. He finished rehabilitation in October. He uses a cane to ambulate and a wheelchair for longer distances. Wife reports that he had one fall in November. She reports that he lost his balance. Fortunately he did not suffer any injuries. He continues to have limited mobility on the right side. The patient denies being depressed but does report that his emotions can be "up and down." Patient also notes trouble with his energy level. He reports that there are times that he just feels "wiped out." He reports that they did decrease his gabapentin to 2 tablets twice a day. The patient did not have any recurrence of nerve pain with this decrease. He returns today for an evaluation.   HISTORY 04/14/16: Sean Wilkerson is a 71 year old male with a history of stroke in the summer 2016. He returns today for follow-up. He is currently taking aspirin and tolerating it well. He denies any significant bruising or bleeding. The patient's blood pressure is in normal range. His primary care has been managing his cholesterol. Wife reports that he has a follow-up visit in August. 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 He states with rehabilitation he has been able to regain some movement of the right arm. He is currently participating in outpatient rehabilitation. He states that this is going very well. The patient is on gabapentin for nerve related pain. His wife reports that this was started in  the hospital however he has not had any discomfort recently. The wife is questioning if he needs to stay on this medication. He returns today for an evaluation.  HISTORY 01/14/16 (Sean Wilkerson): [age] year male seen today for first office follow-up visit following hospital admission for stroke in December 2016. Sean Wilkerson is an 71 y.o. male with a history of hypertension, obesity and syncopal spells, brought to the ED and code stroke status following acute onset of slurred speech along with weakness and numbness involving his right side. Patient was last known well at 7:30 PM 10/05/15. He had no history of stroke nor TIA. He's been taking aspirin 81 mg per day. CT scan of his head showed no acute intracranial abnormality. NIH stroke score was 7. Patient was deemed a candidate for intravenous thrombolytic therapy with TPA, which was administered. Patient was subsequently admitted to neuro intensive care unit for further management. Patient has been experiencing a couple spells of unclear etiology and is currently wearing a long-term cardiac monitor. IV TPA 90 mg Sunday 10/05/2015 at 2045 LSN: 7:30 PM on 10/05/2015 tPA Given: Yes patient was admitted to the intensive care unit where blood pressure was tightly controlled with close neurological monitoring. 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 MRA of the brain showed no large vessel occlusion or stenosis but mild atherosclerotic changes. LDL cholesterol was elevated at 135. Hemoglobin A1c was 6.0. Transthoracic echo showed normal ejection fraction without cardiac source  of embolism. Carotid Doppler showed no significant extracranial stenosis. Patient's neurological exam actually worsened within the NIH scale score 7 on admission became-13 at the time of discharge. He was seen by physical occupational therapy and felt to be a good  patient for rehabilitation and he finished her rehabilitation and is currently at home. He is doing home physical and occupation therapy. Is able to walk with a walker with a foot brace. He can feed himself and needs some help with transfers and with toilet. Patient states he did not tolerate Lipitor due to fear of side effects without actually having them and stopped it after a month. He is tolerating aspirin well without bruising or bleeding. Continues to have dense right hemiplegia with no movements practically Update 05/02/2017 : He returns for follow-up after last visit to in January 2018. Is accompanied by his wife.He states he has had no recurrent stroke or TIAs. He has gained some mobility in his right hand but he still has significant weakness of the grip and hand muscles. He is able to walk with a cane. He does have mild foot drop but does not use a foot brace regularly. He had just one fall several months ago. His tolerate aspirin well without bleeding or bruising. His blood pressure is well controlled and today it is 130/75. He had lipid profile checked in January which was unsatisfactory and Crestor dose was increased to 5 mg daily. Is tolerating it well without side effects. Follow-up lipid profile in April 2018 was satisfactory. He has no other new complaints. He has not had any follow-up Carotid Dopplers checked since his stroke in December 2016  REVIEW OF SYSTEMS: Out of a complete 14 system review of symptoms, the patient complains only of the following symptoms, and all other reviewed systems are negative.  Weakness, gait difficulty, fatigue only  ALLERGIES: Allergies  Allergen Reactions  . No Known Allergies     HOME MEDICATIONS: Outpatient Medications Prior to Visit  Medication Sig Dispense Refill  . amLODipine (NORVASC) 10 MG tablet Take 1 tablet (10 mg total) by mouth daily. 30 tablet 1  . aspirin 81 MG tablet Take 81 mg by mouth daily.    . carvedilol (COREG) 25 MG tablet  Take 1 tablet (25 mg total) by mouth 2 (two) times daily with a meal. 60 tablet 1  . diphenhydramine-acetaminophen (TYLENOL PM) 25-500 MG TABS tablet Take by mouth at bedtime.     . methocarbamol (ROBAXIN) 500 MG tablet Take 1 tablet (500 mg total) by mouth every 6 (six) hours as needed for muscle spasms. 90 tablet 0  . Multiple Vitamin (MULTIVITAMIN WITH MINERALS) TABS tablet Take 1 tablet by mouth daily.    Marland Kitchen gabapentin (NEURONTIN) 400 MG capsule Take 2 capsules (800 mg total) by mouth 3 (three) times daily. (Patient not taking: Reported on 05/02/2017) 90 capsule 1  . nortriptyline (PAMELOR) 10 MG capsule Take 1 capsule (10 mg total) by mouth at bedtime. (Patient not taking: Reported on 05/02/2017) 30 capsule 1  . rosuvastatin (CRESTOR) 5 MG tablet Take 0.5 tablets (2.5 mg total) by mouth daily. (Patient not taking: Reported on 05/02/2017) 30 tablet 2   No facility-administered medications prior to visit.     PAST MEDICAL HISTORY: Past Medical History:  Diagnosis Date  . HLD (hyperlipidemia) 10/08/2015  . HTN (hypertension) 10/08/2015  . Hypertension   . Stroke [facility])     PAST SURGICAL HISTORY: Past Surgical History:  Procedure Laterality Date  . NO PAST SURGERIES  FAMILY HISTORY: Family History  Problem Relation Age of Onset  . Stroke Father   . Stroke Brother     SOCIAL HISTORY: Social History   Social History  . Marital status: Single    Spouse name: N/A  . Number of children: N/A  . Years of education: N/A   Occupational History  . Not on file.   Social History Main Topics  . Smoking status: Never Smoker  . Smokeless tobacco: Never Used  . Alcohol use No  . Drug use: No  . Sexual activity: Not on file   Other Topics Concern  . Not on file   Social History Narrative  . No narrative on file      PHYSICAL EXAM  Vitals:   05/02/17 1401  BP: 130/75  Pulse: 69  Weight: 225 lb (102.1 kg)   Body mass index is 33.23 kg/m.  Generalized: Middle-aged  African-American male not in distress. . Afebrile. Head is nontraumatic. Neck is supple without bruit.    Cardiac exam no murmur or gallop. Lungs are clear to auscultation. Distal pulses are well felt. Neurological examination  Mentation: Alert oriented to time, place, history taking. Follows all commands speech and language fluent Cranial nerve II-XII: Pupils were equal round reactive to light. Extraocular movements were full, visual field were full on confrontational test. Facial sensation and strength were normal. Uvula tongue midline. Head turning and shoulder shrug  were normal and symmetric. Motor: The motor testing reveals 5 over 5 strength in the left upper and lower extremity. Right hemiplegia with 2/5 strength proximally in the right upper extremity and 1/5 distally with significant grip and hand weakness. Right lower eczema to strength is 3/5 with right foot drop. Tone is increased on the right compared to the left side. Sensory: Sensory testing is intact to soft touch on all 4 extremities. No evidence of extinction is noted.  Coordination: Cerebellar testing reveals good finger-nose-finger and heel-to-shin on the left. Gait and station:Walks with a hemiplegic gait with circumduction and right foot drop using a cane. Reflexes: Deep tendon reflexes are symmetric and normal bilaterally.   DIAGNOSTIC DATA (LABS, IMAGING, TESTING) - I reviewed patient records, labs, notes, testing and imaging myself where available.  Lab Results  Component Value Date   WBC 5.7 11/18/2015   HGB 12.9 (L) 11/18/2015   HCT 39.3 11/18/2015   MCV 98.0 11/18/2015   PLT 226 11/18/2015      Component Value Date/Time   NA 141 11/18/2015 2255   K 4.1 11/18/2015 2255   CL 107 11/18/2015 2255   CO2 24 11/18/2015 2255   GLUCOSE 184 (H) 11/18/2015 2255   BUN 13 11/18/2015 2255   CREATININE 0.86 11/18/2015 2255   CALCIUM 9.3 11/18/2015 2255   PROT 6.5 10/09/2015 0456   ALBUMIN 3.3 (L) 10/09/2015 0456   AST 25  10/09/2015 0456   ALT 29 10/09/2015 0456   ALKPHOS 72 10/09/2015 0456   BILITOT 0.7 10/09/2015 0456   GFRNONAA >60 11/18/2015 2255   GFRAA >60 11/18/2015 2255   Lab Results  Component Value Date   CHOL 205 (H) 10/06/2015   HDL 44 10/06/2015   LDLCALC 135 (H) 10/06/2015   TRIG 131 10/06/2015   CHOLHDL 4.7 10/06/2015   Lab Results  Component Value Date   HGBA1C 6.0 (H) 10/06/2015     ASSESSMENT AND PLAN 70 y.o. year old male  has a past medical history of HLD (hyperlipidemia) (10/08/2015); HTN (hypertension) (10/08/2015); Hypertension; and Stroke [facility]). here  with:  1. 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 Paresthesia  I had a long d/w patient about his remote lacunar stroke, risk for recurrent stroke/TIAs, personally independently reviewed imaging studies and stroke evaluation results and answered questions.Continue aspirin 81 mg daily  for secondary stroke prevention and maintain strict control of hypertension with blood pressure goal below 130/90, diabetes with hemoglobin A1c goal below 6.5% and lipids with LDL cholesterol goal below 70 mg/dL. I also advised the patient to eat a healthy diet with plenty of whole grains, cereals, fruits and vegetables, exercise regularly and maintain ideal body weight . We also discussed fall prevention precautions and I advised him to use a cane and all times and to wear her right foot brace while walking outdoors and long distances. Check screening carotid ultrasound and transcranial Doppler study.Greater than 50% time during this 25 minute visit was spent on counseling and coordination of care about his remote lacunar infarct, discussion about stroke prevention and treatment and answering questions..Followup in the future with me in One year or call earlier if necessary      Delia Heady, MD 05/02/2017, 2:30 PM [facility] [address] [place], [place] [address] [phone]

## 2017-05-02 NOTE — Patient Instructions (Signed)
I had a long d/w patient about his remote lacunar stroke, risk for recurrent stroke/TIAs, personally independently reviewed imaging studies and stroke evaluation results and answered questions.Continue aspirin 81 mg daily  for secondary stroke prevention and maintain strict control of hypertension with blood pressure goal below 130/90, diabetes with hemoglobin A1c goal below 6.5% and lipids with LDL cholesterol goal below 70 mg/dL. I also advised the patient to eat a healthy diet with plenty of whole grains, cereals, fruits and vegetables, exercise regularly and maintain ideal body weight . We also discussed fall prevention precautions and I advised him to use a cane and all times and to wear her right foot brace while walking outdoors and long distances. Check screening carotid ultrasound and transcranial Doppler study.Followup in the future with me in One year or call earlier if necessary

## 2017-05-09 ENCOUNTER — Ambulatory Visit (HOSPITAL_COMMUNITY)
Admission: RE | Admit: 2017-05-09 | Discharge: 2017-05-09 | Disposition: A | Discharge status: Home / Self Care | Payer: Medicare Other | Source: Ambulatory Visit | Attending: Neurology | Admitting: Neurology

## 2017-05-09 ENCOUNTER — Ambulatory Visit (HOSPITAL_BASED_OUTPATIENT_CLINIC_OR_DEPARTMENT_OTHER)
Admission: RE | Admit: 2017-05-09 | Discharge: 2017-05-09 | Disposition: A | Discharge status: Home / Self Care | Payer: Medicare Other | Source: Ambulatory Visit | Attending: Neurology | Admitting: Neurology

## 2017-05-09 DIAGNOSIS — G8110 Spastic hemiplegia affecting unspecified side: Secondary | ICD-10-CM | POA: Diagnosis not present

## 2017-05-09 NOTE — Progress Notes (Signed)
VASCULAR LAB PRELIMINARY  PRELIMINARY  PRELIMINARY  PRELIMINARY  Carotid duplex completed.    Preliminary report:  1-39% ICA plaquing. Vertebral artery flow is antegrade.   Antuane Eastridge, RVT 05/09/2017, 1:48 PM

## 2017-05-10 LAB — VAS US CAROTID
LCCAPSYS: 111 cm/s
LEFT ECA DIAS: -17 cm/s
LEFT VERTEBRAL DIAS: -11 cm/s
Left CCA dist dias: -17 cm/s
Left CCA dist sys: -80 cm/s
Left CCA prox dias: 21 cm/s
Left ICA dist dias: -21 cm/s
Left ICA dist sys: -64 cm/s
Left ICA prox dias: -19 cm/s
Left ICA prox sys: -68 cm/s
RCCADSYS: -83 cm/s
RCCAPDIAS: -17 cm/s
RIGHT ECA DIAS: -7 cm/s
RIGHT VERTEBRAL DIAS: -14 cm/s
Right CCA prox sys: -116 cm/s

## 2017-05-16 ENCOUNTER — Telehealth: Payer: Self-pay

## 2017-05-16 NOTE — Telephone Encounter (Signed)
-----   Message from Micki RileyPramod S Sethi, MD sent at 05/13/2017  5:47 PM EDT ----- Sean RoachKindly inform the patient that transcanal Doppler study on 05/09/17 was normal

## 2017-05-16 NOTE — Telephone Encounter (Signed)
Rn call patient that the vas transcranal  doppler was normal. Pt verbalized understanding.

## 2017-07-21 IMAGING — CT CT HEAD W/O CM
2 series · 15 of 30 positions shown, 19 images · non-contrast
Comparison: None.

CLINICAL DATA: 69-year-old male with right-sided weakness. Code
stroke.

EXAM:
CT HEAD WITHOUT CONTRAST
TECHNIQUE: Contiguous axial images were obtained from the base of the skull
through the vertex without intravenous contrast.

[Series 201: head w/o, idose (1) · axial · non-contrast · 0.44mm/px · z∈[+67,+197]mm · 13 of 32 slices shown, 17 images]
[im 3/32  brain]
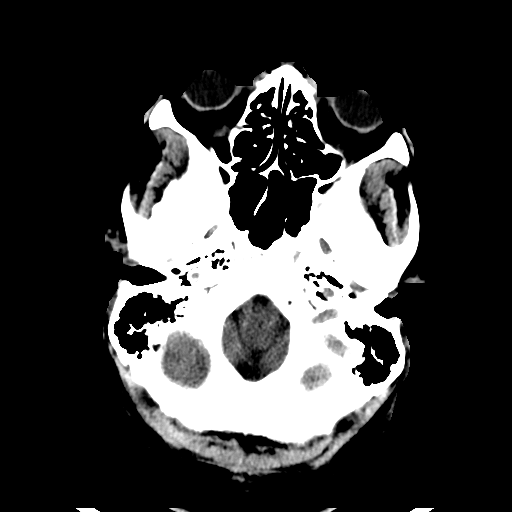
[im 3/32  bone]
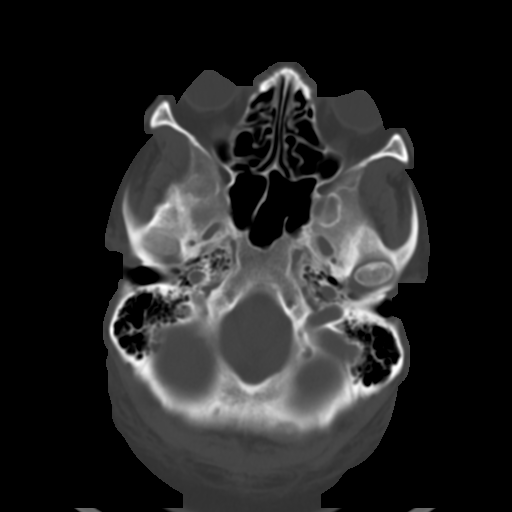
[im 5/32  brain]
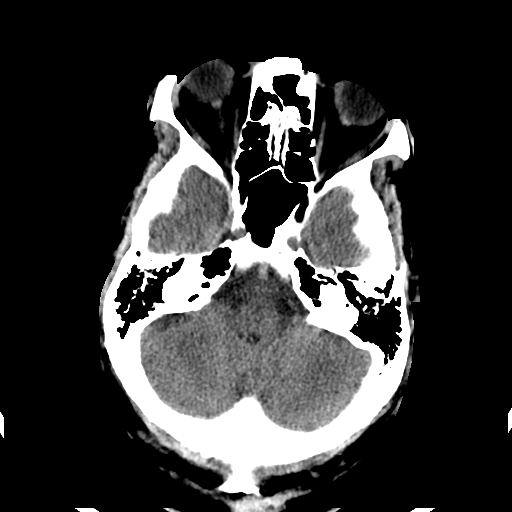
[im 7/32  brain]
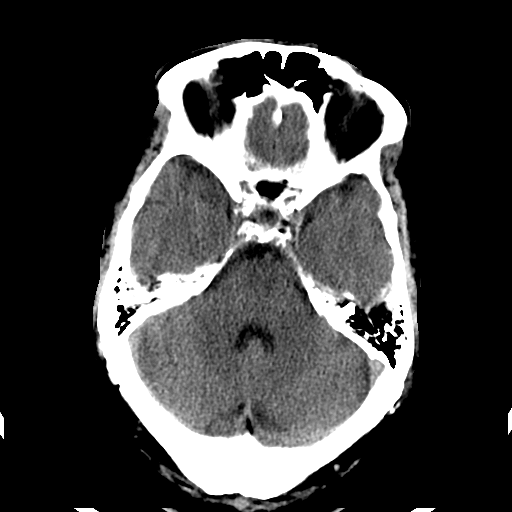
[im 9/32  brain]
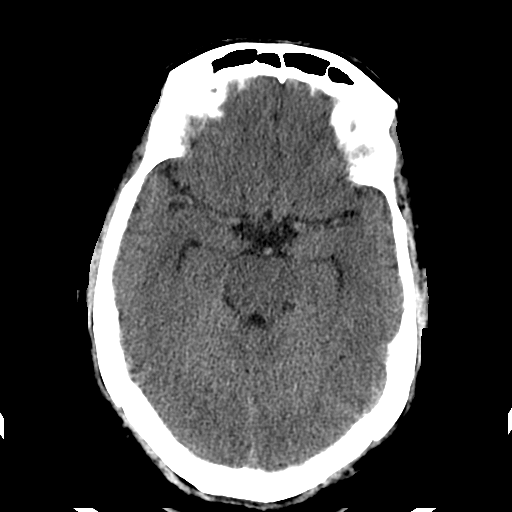
[im 12/32  brain]
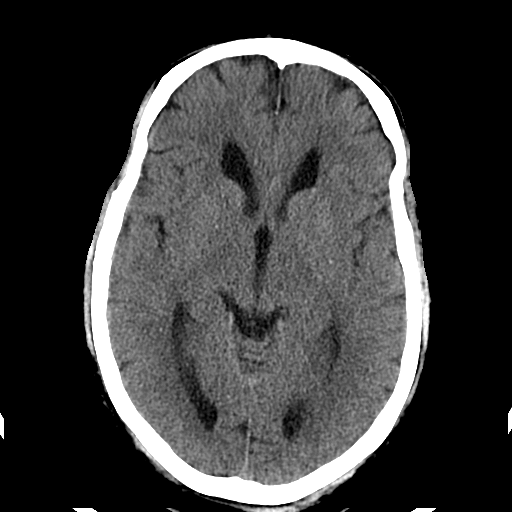
[im 12/32  bone]
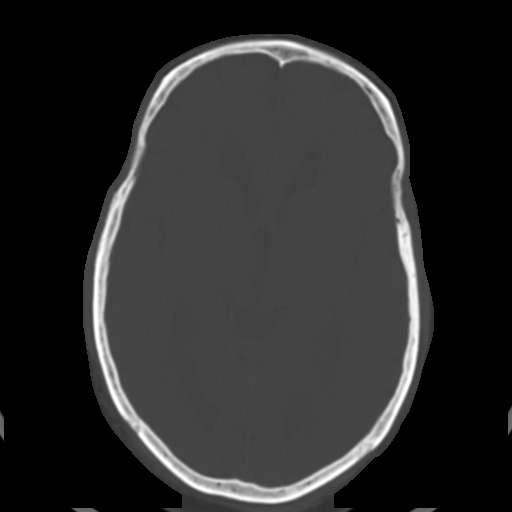
[im 14/32  brain]
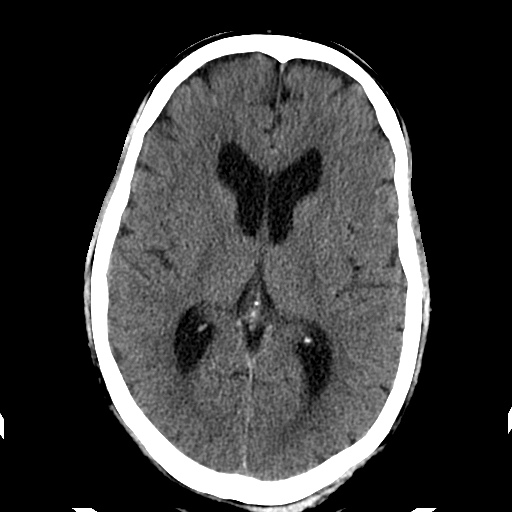
[im 16/32  brain]
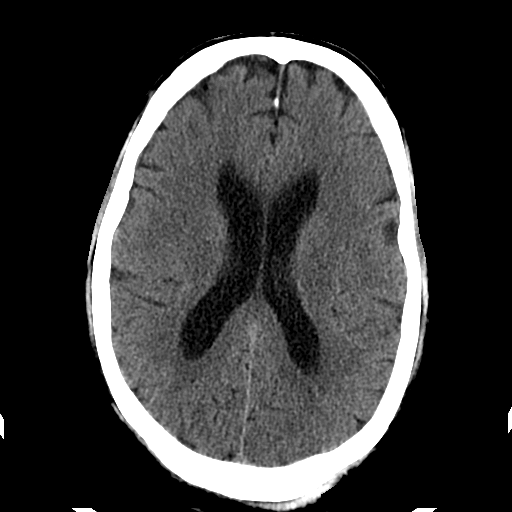
[im 18/32  brain]
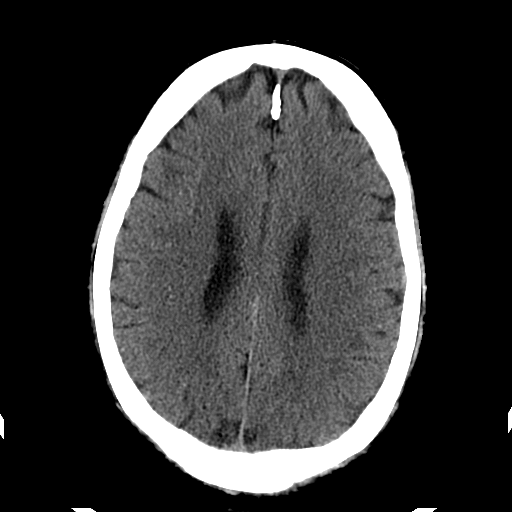
[im 20/32  brain]
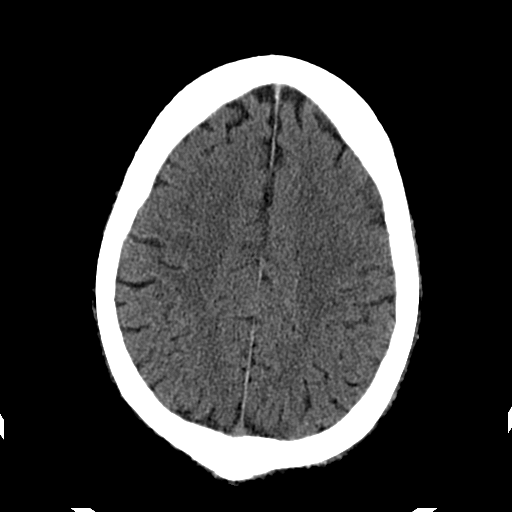
[im 20/32  bone]
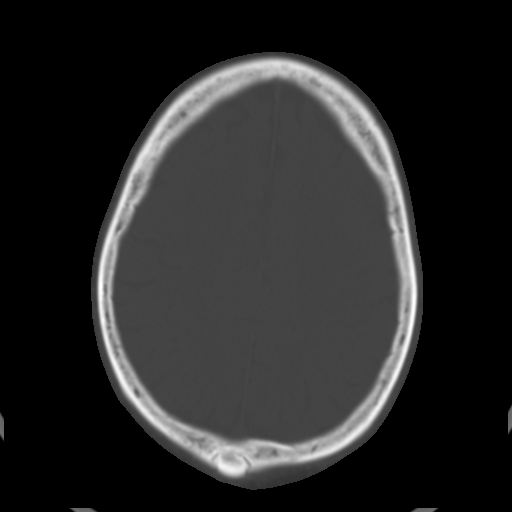
[im 23/32  brain]
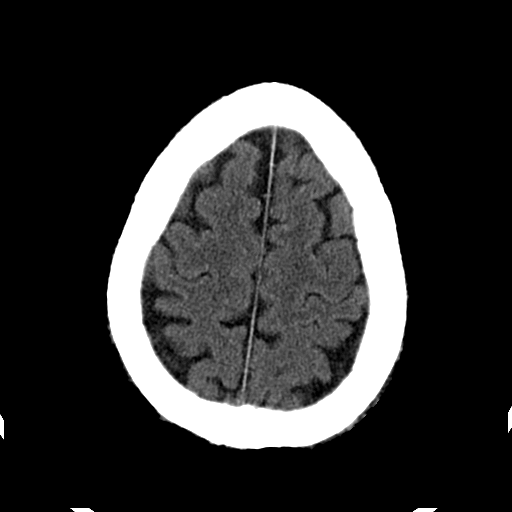
[im 25/32  brain]
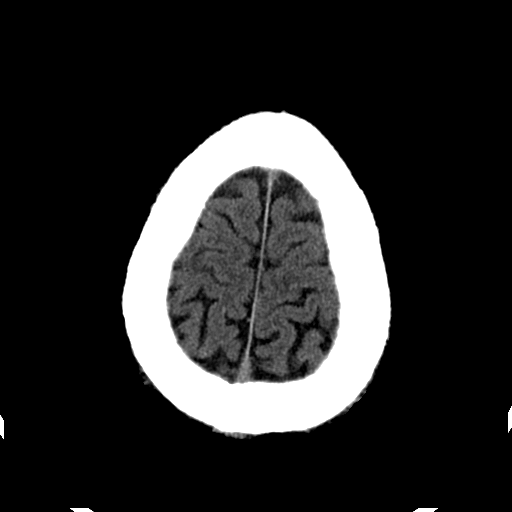
[im 27/32  brain]
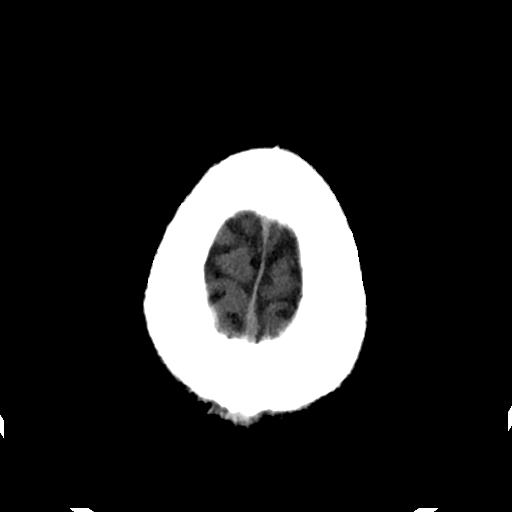
[im 29/32  brain]
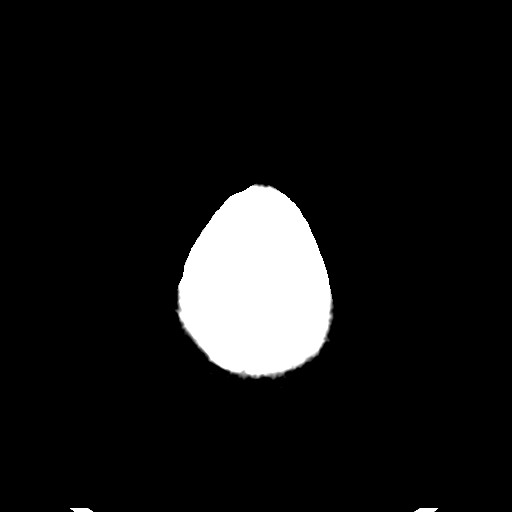
[im 29/32  bone]
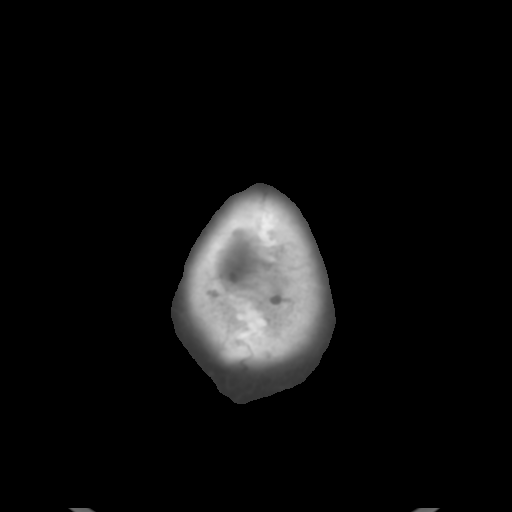

[Series 202: head w/o bone, idose (1) · axial · non-contrast · 0.44mm/px · z∈[+67,+87]mm · 2 of 32 slices shown]
[im 3/32  bone]
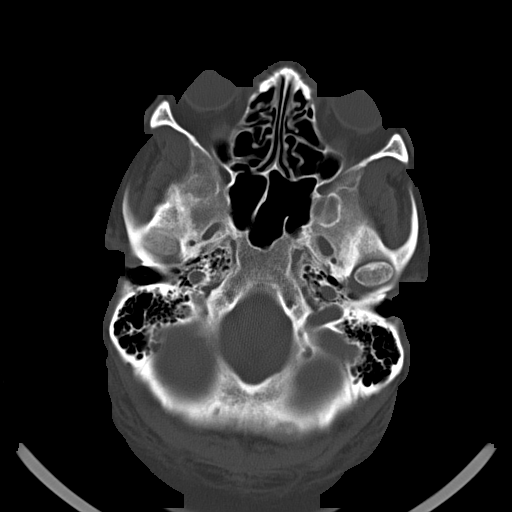
[im 7/32  bone]
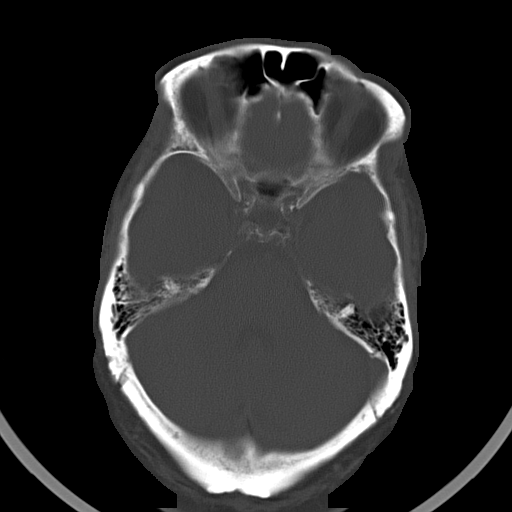

[15 of 30 positions shown; findings below may reference images not displayed]

FINDINGS: The ventricles and sulci are appropriate in size for patient's age.
Mild periventricular and deep white matter hypodensities represent
chronic microvascular ischemic changes. There is no intracranial
hemorrhage. No mass effect or midline shift identified.

The visualized paranasal sinuses and mastoid air cells are well
aerated. The calvarium is intact.
IMPRESSION: No acute intracranial hemorrhage.

Mild chronic microvascular ischemic disease.

If symptoms persist and there are no contraindications, MRI may
provide better evaluation if clinically indicated.

These results were called by telephone at the time of interpretation
on 10/05/2015 at [DATE] to Dr. Hedyutullah who verbally acknowledged
these results.

## 2017-07-22 IMAGING — RF DG SWALLOWING FUNCTION - [facility]
1 series · 18 of 24 positions shown · non-contrast
Comparison: none

[Series 1: run · 12 acquisitions, 18 frames shown]
[im 1/12]
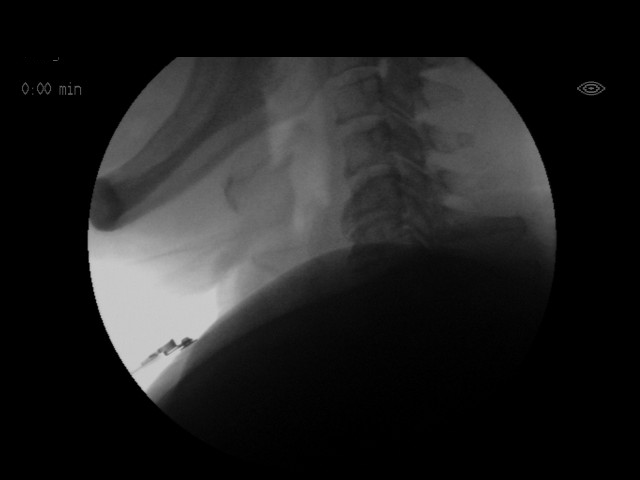
[im 2/12]
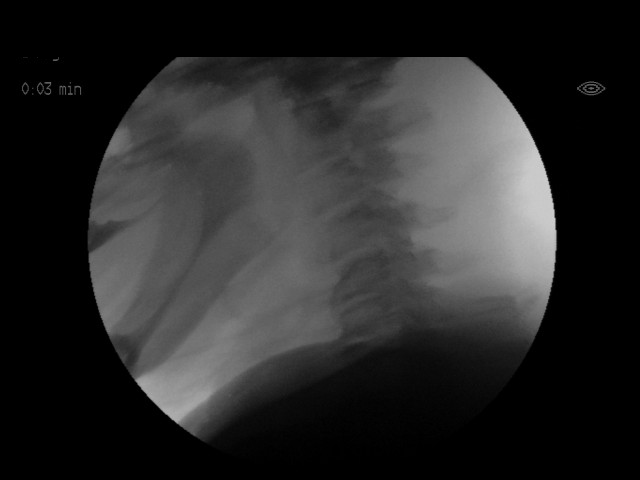
[im 2/12]
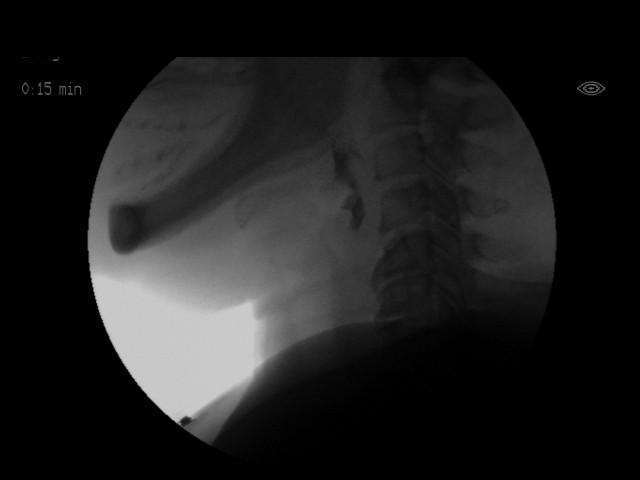
[im 3/12]
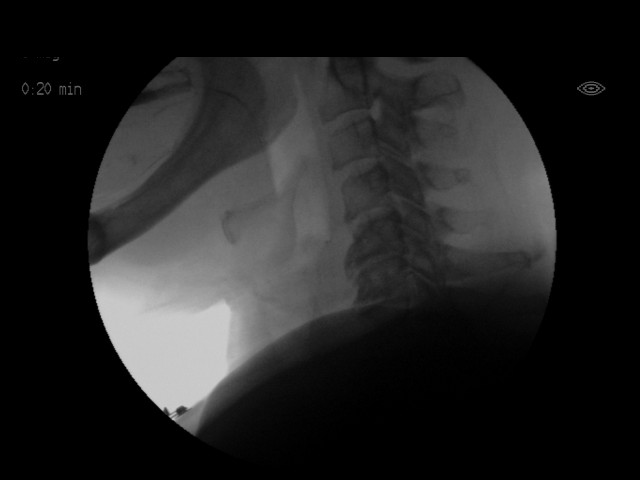
[im 4/12]
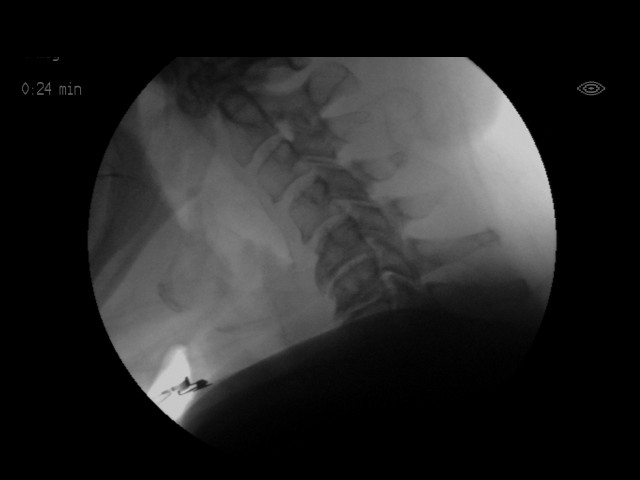
[im 4/12]
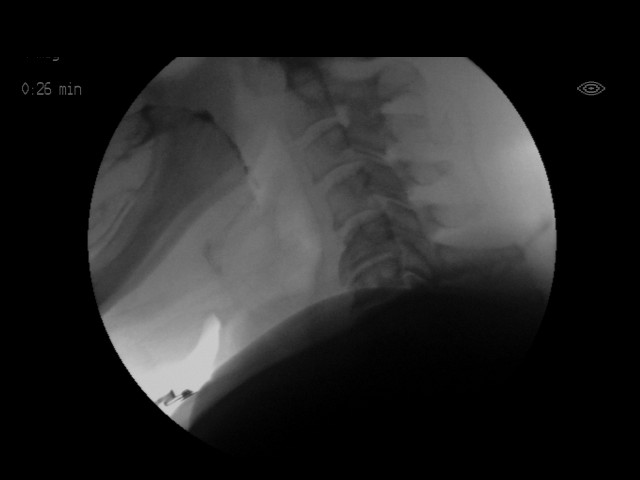
[im 5/12]
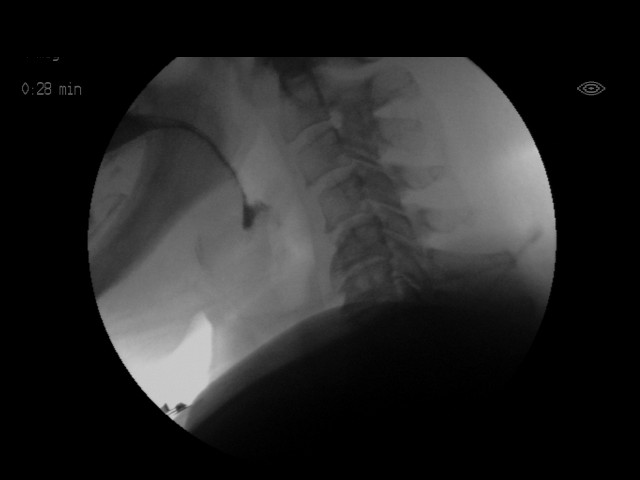
[im 6/12]
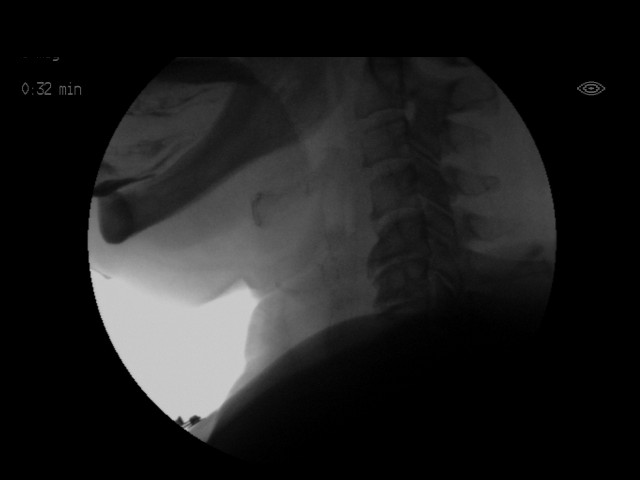
[im 6/12]
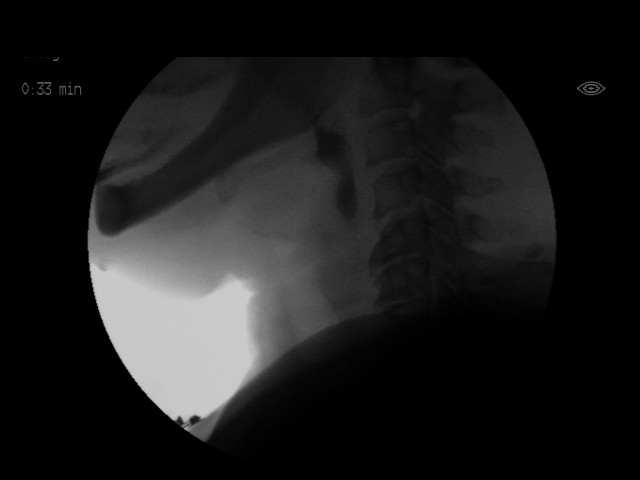
[im 7/12]
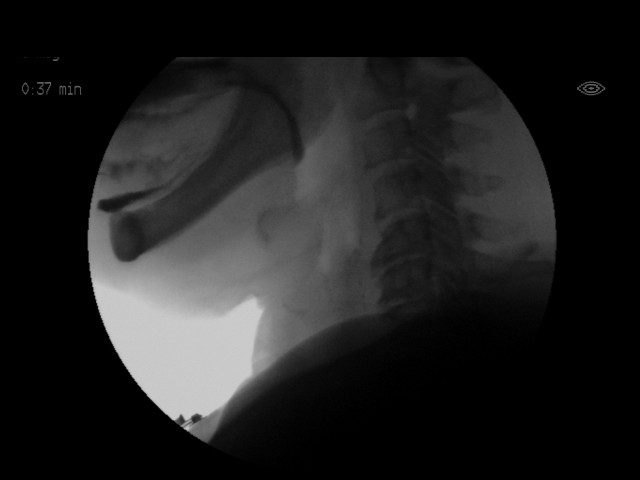
[im 8/12]
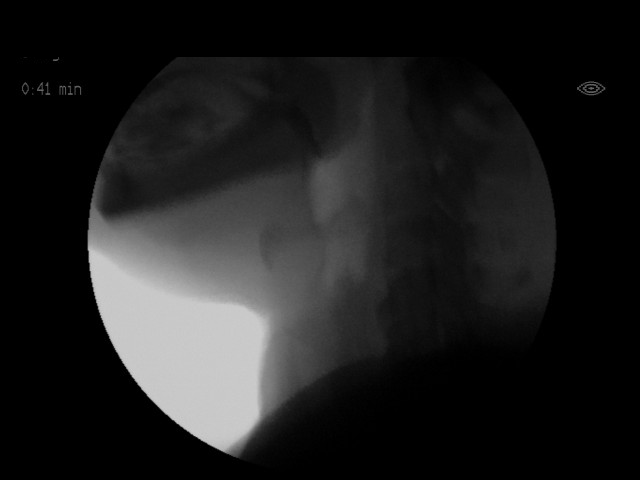
[im 8/12]
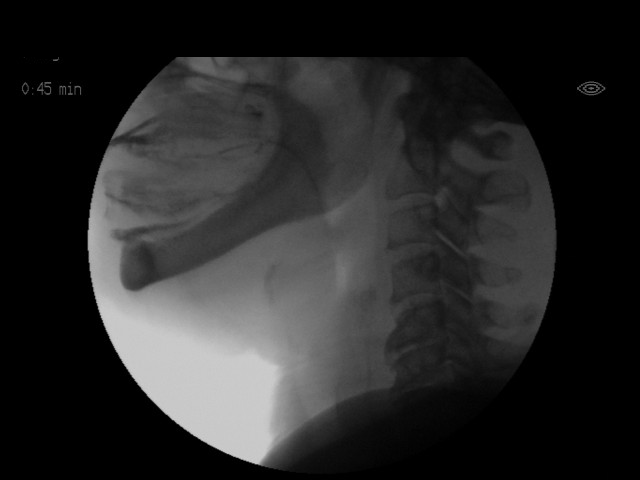
[im 9/12]
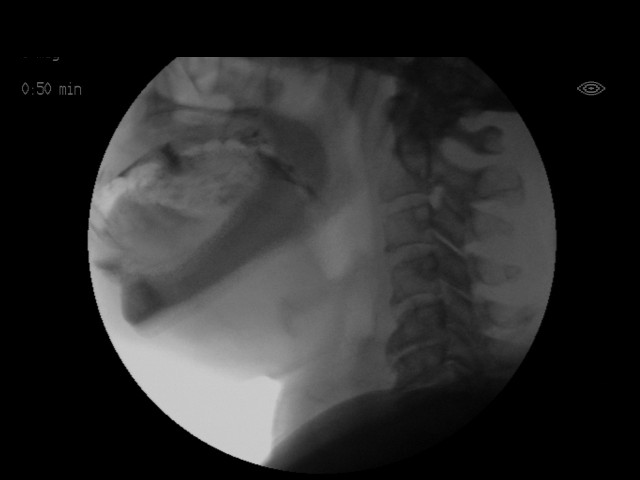
[im 10/12]
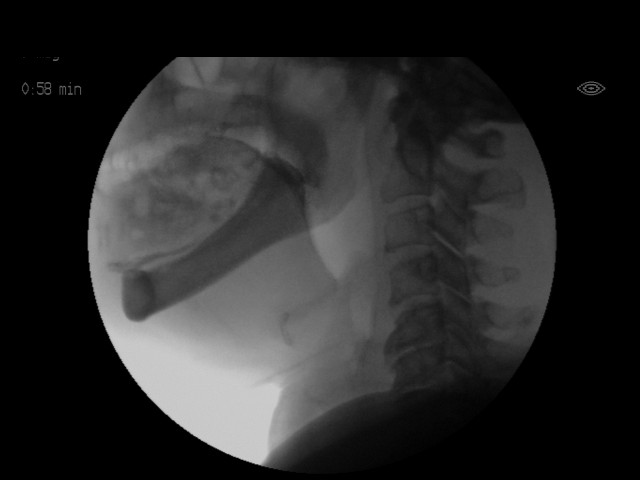
[im 10/12]
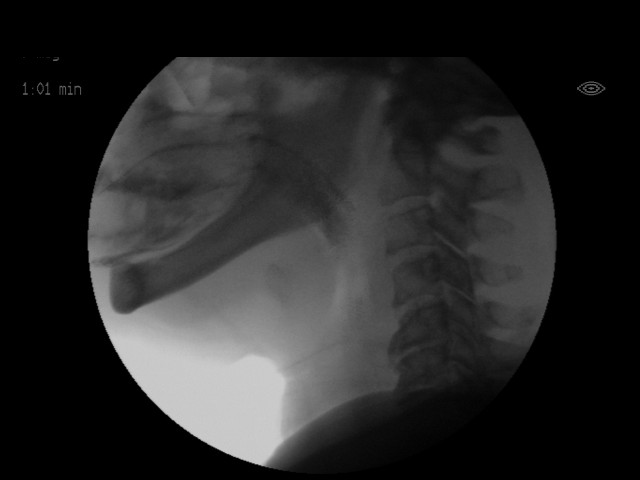
[im 11/12]
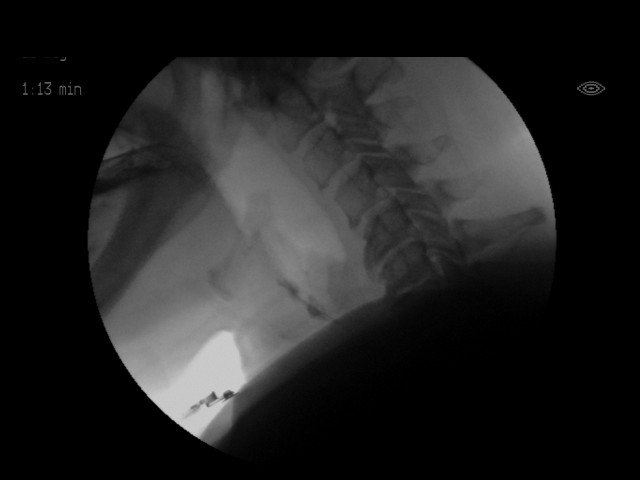
[im 12/12]
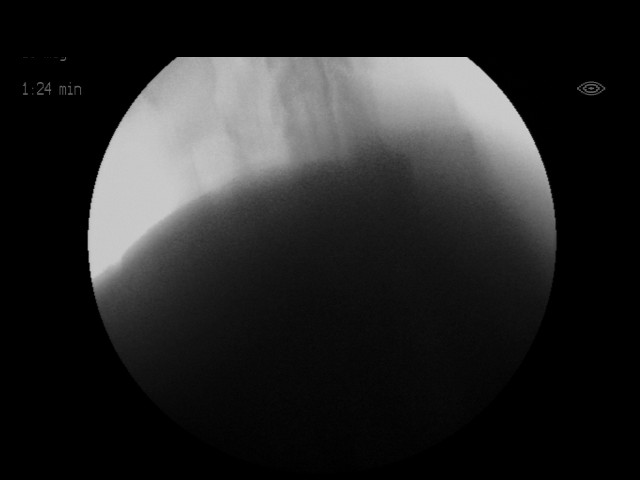
[im 12/12]
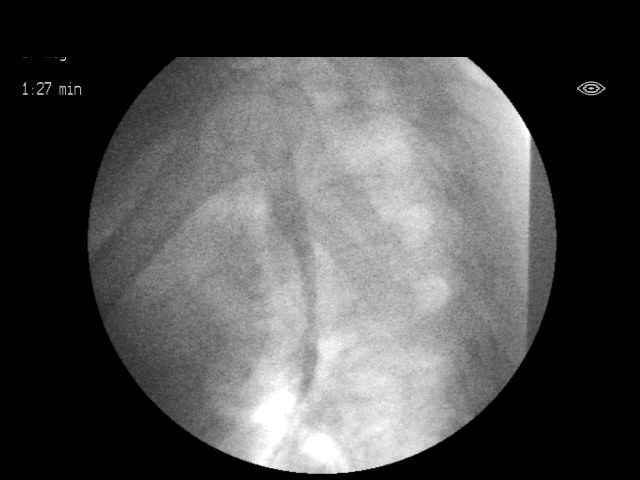

[18 of 24 positions shown; findings below may reference images not displayed]

FLUOROSCOPY FOR SWALLOWING FUNCTION STUDY:
Fluoroscopy was provided for swallowing function study, which was administered by a speech pathologist.  Final results and recommendations from this study are contained within the speech pathology report.

## 2017-09-22 ENCOUNTER — Encounter: Payer: Self-pay | Admitting: Physician Assistant

## 2018-05-03 ENCOUNTER — Encounter: Payer: Self-pay | Admitting: Neurology

## 2018-05-03 ENCOUNTER — Ambulatory Visit (INDEPENDENT_AMBULATORY_CARE_PROVIDER_SITE_OTHER): Payer: Medicare Other | Admitting: Neurology

## 2018-05-03 VITALS — BP 130/72 | HR 65 | Wt 243.0 lb

## 2018-05-03 DIAGNOSIS — G8110 Spastic hemiplegia affecting unspecified side: Secondary | ICD-10-CM

## 2018-05-03 NOTE — Progress Notes (Signed)
PATIENT: Sean BirchwoodLarry Lee Wilkerson DOB: 19-Jan-1946  REASON FOR VISIT: follow up- stroke HISTORY FROM: patient  HISTORY OF PRESENT ILLNESS: Today 10/25/16: [name] is a 72 year old male with a history of stroke. He returns today for follow-up. He continues on aspirin and is tolerating it well. Denies any significant bruising or bleeding. Patient reports that he followed up with his primary care December 4. Reports that his Crestor was increased at that visit. The patient is not diabetic. Does not smoke cigarettes. He finished rehabilitation in October. He uses a cane to ambulate and a wheelchair for longer distances. Wife reports that he had one fall in November. She reports that he lost his balance. Fortunately he did not suffer any injuries. He continues to have limited mobility on the right side. The patient denies being depressed but does report that his emotions can be "up and down." Patient also notes trouble with his energy level. He reports that there are times that he just feels "wiped out." He reports that they did decrease his gabapentin to 2 tablets twice a day. The patient did not have any recurrence of nerve pain with this decrease. He returns today for an evaluation.   HISTORY 04/14/16: [name] is a 72 year old male with a history of stroke in the summer 2016. He returns today for follow-up. He is currently taking aspirin and tolerating it well. He denies any significant bruising or bleeding. The patient's blood pressure is in normal range. His primary care has been managing his cholesterol. Wife reports that he has a follow-up visit in August. 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 He states with rehabilitation he has been able to regain some movement of the right arm. He is currently participating in outpatient rehabilitation. He states that this is going very well. The patient is on gabapentin for nerve related pain. His wife reports that this was started in  the hospital however he has not had any discomfort recently. The wife is questioning if he needs to stay on this medication. He returns today for an evaluation.  HISTORY 01/14/16 (Sean Wilkerson): [age] year male seen today for first office follow-up visit following hospital admission for stroke in December 2016. Sean BirchwoodLarry Lee Wilkerson is an 72 y.o. male with a history of hypertension, obesity and syncopal spells, brought to the ED and code stroke status following acute onset of slurred speech along with weakness and numbness involving his right side. Patient was last known well at 7:30 PM 10/05/15. He had no history of stroke nor TIA. He's been taking aspirin 81 mg per day. CT scan of his head showed no acute intracranial abnormality. NIH stroke score was 7. Patient was deemed a candidate for intravenous thrombolytic therapy with TPA, which was administered. Patient was subsequently admitted to neuro intensive care unit for further management. Patient has been experiencing a couple spells of unclear etiology and is currently wearing a long-term cardiac monitor. IV TPA 90 mg Sunday 10/05/2015 at 2045 LSN: 7:30 PM on 10/05/2015 tPA Given: Yes patient was admitted to the intensive care unit where blood pressure was tightly controlled with close neurological monitoring. 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 MRA of the brain showed no large vessel occlusion or stenosis but mild atherosclerotic changes. LDL cholesterol was elevated at 135. Hemoglobin A1c was 6.0. Transthoracic echo showed normal ejection fraction without cardiac source  of embolism. Carotid Doppler showed no significant extracranial stenosis. Patient's neurological exam actually worsened within the NIH scale score 7 on admission became-13 at the time of discharge. He was seen by physical occupational therapy and felt to be a good  patient for rehabilitation and he finished her rehabilitation and is currently at home. He is doing home physical and occupation therapy. Is able to walk with a walker with a foot brace. He can feed himself and needs some help with transfers and with toilet. Patient states he did not tolerate Lipitor due to fear of side effects without actually having them and stopped it after a month. He is tolerating aspirin well without bruising or bleeding. Continues to have dense right hemiplegia with no movements practically Update 05/02/2017 : He returns for follow-up after last visit to in January 2018. Is accompanied by his wife.He states he has had no recurrent stroke or TIAs. He has gained some mobility in his right hand but he still has significant weakness of the grip and hand muscles. He is able to walk with a cane. He does have mild foot drop but does not use a foot brace regularly. He had just one fall several months ago. His tolerate aspirin well without bleeding or bruising. His blood pressure is well controlled and today it is 130/75. He had lipid profile checked in January which was unsatisfactory and Crestor dose was increased to 5 mg daily. Is tolerating it well without side effects. Follow-up lipid profile in April 2018 was satisfactory. He has no other new complaints. He has not had any follow-up Carotid Dopplers checked since his stroke in December 2016 Update 05/03/2018 : He returns for follow-up after last visit a year ago. He is accompanied by his wife today. He is doing well from neurovascular standpoint without any recurrent symptoms since his stroke in December 2016. He continues to have right hemiplegia with significant weakness in his right upper extremity and right foot drop. He is able to walk with a 4 pronged cane with an AFO. He has had no falls or injuries. He remains on aspirin which is tolerating well without bruising or bleeding. He continues to tolerate Crestor well without muscle aches or  pains. He states last lipid profile was satisfactory. His blood pressure remains well controlled and today it is 130/72. He has no new neurological complaints. REVIEW OF SYSTEMS: Out of a complete 14 system review of symptoms, the patient complains only of the following symptoms, and all other reviewed systems are negative.  Weakness, gait difficulty, fatigue, depression only  ALLERGIES: Allergies  Allergen Reactions  . No Known Allergies     HOME MEDICATIONS: Outpatient Medications Prior to Visit  Medication Sig Dispense Refill  . amLODipine (NORVASC) 10 MG tablet Take 1 tablet (10 mg total) by mouth daily. 30 tablet 1  . aspirin 81 MG tablet Take 81 mg by mouth daily.    Marland Kitchen atorvastatin (LIPITOR) 40 MG tablet atorvastatin 40 mg tablet    . carvedilol (COREG) 25 MG tablet Take 1 tablet (25 mg total) by mouth 2 (two) times daily with a meal. 60 tablet 1  . Melatonin 10 MG TABS Take by mouth.    . methocarbamol (ROBAXIN) 500 MG tablet Take 1 tablet (500 mg total) by mouth every 6 (six) hours as needed for muscle spasms. 90 tablet 0  . Multiple Vitamin (MULTIVITAMIN WITH MINERALS) TABS tablet Take 1 tablet by mouth daily.    . rosuvastatin (CRESTOR) 5 MG tablet rosuvastatin  5 mg tablet  TK 1 T PO QD    . rosuvastatin (CRESTOR) 5 MG tablet rosuvastatin 5 mg tablet  TAKE 1 TABLET BY MOUTH EVERY DAY    . Vitamins/Minerals TABS Take by mouth.    . diphenhydramine-acetaminophen (TYLENOL PM) 25-500 MG TABS tablet Take by mouth at bedtime.     . diphenhydramine-acetaminophen (TYLENOL PM) 25-500 MG TABS tablet Take by mouth.     No facility-administered medications prior to visit.     PAST MEDICAL HISTORY: Past Medical History:  Diagnosis Date  . HLD (hyperlipidemia) 10/08/2015  . HTN (hypertension) 10/08/2015  . Hypertension   . Stroke [facility])     PAST SURGICAL HISTORY: Past Surgical History:  Procedure Laterality Date  . NO PAST SURGERIES      FAMILY HISTORY: Family History    Problem Relation Age of Onset  . Stroke Father   . Stroke Brother     SOCIAL HISTORY: Social History   Socioeconomic History  . Marital status: Single    Spouse name: Not on file  . Number of children: Not on file  . Years of education: Not on file  . Highest education level: Not on file  Occupational History  . Not on file  Social Needs  . Financial resource strain: Not on file  . Food insecurity:    Worry: Not on file    Inability: Not on file  . Transportation needs:    Medical: Not on file    Non-medical: Not on file  Tobacco Use  . Smoking status: Never Smoker  . Smokeless tobacco: Never Used  Substance and Sexual Activity  . Alcohol use: No  . Drug use: No  . Sexual activity: Not on file  Lifestyle  . Physical activity:    Days per week: Not on file    Minutes per session: Not on file  . Stress: Not on file  Relationships  . Social connections:    Talks on phone: Not on file    Gets together: Not on file    Attends religious service: Not on file    Active member of club or organization: Not on file    Attends meetings of clubs or organizations: Not on file    Relationship status: Not on file  . Intimate partner violence:    Fear of current or ex partner: Not on file    Emotionally abused: Not on file    Physically abused: Not on file    Forced sexual activity: Not on file  Other Topics Concern  . Not on file  Social History Narrative  . Not on file      PHYSICAL EXAM  Vitals:   05/03/18 1405  BP: 130/72  Pulse: 65  Weight: 243 lb (110.2 kg)   Body mass index is 35.88 kg/m.  Generalized: Middle-aged African-American male not in distress. . Afebrile. Head is nontraumatic. Neck is supple without bruit.    Cardiac exam no murmur or gallop. Lungs are clear to auscultation. Distal pulses are well felt. Neurological examination  Mentation: Alert oriented to time, place, history taking. Follows all commands speech and language fluent Cranial  nerve II-XII: Pupils were equal round reactive to light. Extraocular movements were full, visual field were full on confrontational test. Facial sensation and strength were normal. Uvula tongue midline. Head turning and shoulder shrug  were normal and symmetric. Motor: The motor testing reveals 5 over 5 strength in the left upper and lower extremity. Right hemiplegia with 2/5  strength proximally in the right upper extremity and 1/5 distally with significant grip and hand weakness. Right lower eczema to strength is 3/5 with right foot drop. Tone is increased on the right compared to the left side. Sensory: Sensory testing is intact to soft touch on all 4 extremities. No evidence of extinction is noted.  Coordination: Cerebellar testing reveals good finger-nose-finger and heel-to-shin on the left. Gait and station:Walks with a hemiplegic gait with circumduction and right foot drop using  AFO and a cane. Reflexes: Deep tendon reflexes are symmetric and normal bilaterally.   DIAGNOSTIC DATA (LABS, IMAGING, TESTING) - I reviewed patient records, labs, notes, testing and imaging myself where available.  Lab Results  Component Value Date   WBC 5.7 11/18/2015   HGB 12.9 (L) 11/18/2015   HCT 39.3 11/18/2015   MCV 98.0 11/18/2015   PLT 226 11/18/2015      Component Value Date/Time   NA 141 11/18/2015 2255   K 4.1 11/18/2015 2255   CL 107 11/18/2015 2255   CO2 24 11/18/2015 2255   GLUCOSE 184 (H) 11/18/2015 2255   BUN 13 11/18/2015 2255   CREATININE 0.86 11/18/2015 2255   CALCIUM 9.3 11/18/2015 2255   PROT 6.5 10/09/2015 0456   ALBUMIN 3.3 (L) 10/09/2015 0456   AST 25 10/09/2015 0456   ALT 29 10/09/2015 0456   ALKPHOS 72 10/09/2015 0456   BILITOT 0.7 10/09/2015 0456   GFRNONAA >60 11/18/2015 2255   GFRAA >60 11/18/2015 2255   Lab Results  Component Value Date   CHOL 205 (H) 10/06/2015   HDL 44 10/06/2015   LDLCALC 135 (H) 10/06/2015   TRIG 131 10/06/2015   CHOLHDL 4.7 10/06/2015    Lab Results  Component Value Date   HGBA1C 6.0 (H) 10/06/2015     ASSESSMENT AND PLAN 72 y.o. year old male  has a past medical history of HLD (hyperlipidemia) (10/08/2015), HTN (hypertension) (10/08/2015), Hypertension, and Stroke (HCC). here with:  1. 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 I also advised the patient to eat a healthy diet with plenty of whole grains, cereals, fruits and vegetables, exercise regularly and maintain ideal body weight . We also discussed fall prevention precautions and I advised him to use a cane and all times and to wear her right foot brace while walking outdoors and long distances. Since it has been nearly 3 years since this stroke and he is stable from neurovascular standpoint I do not believe further routine scheduled follow-up appointment with me is necessary but he may be referred back by his primary physician in the future only as needed.Greater than 50% time during this 25 minute visit was spent on counseling and coordination of care about his remote lacunar infarct, discussion about stroke prevention and treatment and answering questions.      Delia Heady, MD 05/03/2018, 2:40 PM [facility] [address], [address] [place], [place] [address] [phone]

## 2018-05-03 NOTE — Patient Instructions (Signed)
I had a long d/w patient and his wife about his remote lacunar stroke, risk for recurrent stroke/TIAs, personally independently reviewed imaging studies and stroke evaluation results and answered questions.Continue aspirin 81 mg daily  for secondary stroke prevention and maintain strict control of hypertension with blood pressure goal below 130/90, diabetes with hemoglobin A1c goal below 6.5% and lipids with LDL cholesterol goal below 70 mg/dL. I also advised the patient to eat a healthy diet with plenty of whole grains, cereals, fruits and vegetables, exercise regularly and maintain ideal body weight . We also discussed fall prevention precautions and I advised him to use a cane and all times and to wear her right foot brace while walking outdoors and long distances. Since it has been nearly 3 years since this stroke and he is stable from neurovascular standpoint I do not believe further routine scheduled follow-up appointment with me is necessary but he may be referred back by his primary physician in the future only as needed

## 2021-06-26 NOTE — Telephone Encounter (Signed)
This encounter was opened in error. No patient interaction occurred.   

## 2024-08-18 ENCOUNTER — Emergency Department (HOSPITAL_COMMUNITY)

## 2024-08-18 ENCOUNTER — Emergency Department (HOSPITAL_COMMUNITY)
Admission: EM | Admit: 2024-08-18 | Discharge: 2024-08-18 | Disposition: A | Discharge status: Home / Self Care | Attending: Emergency Medicine | Admitting: Emergency Medicine

## 2024-08-18 ENCOUNTER — Other Ambulatory Visit: Payer: Self-pay

## 2024-08-18 ENCOUNTER — Encounter (HOSPITAL_COMMUNITY): Payer: Self-pay

## 2024-08-18 DIAGNOSIS — R7989 Other specified abnormal findings of blood chemistry: Secondary | ICD-10-CM | POA: Insufficient documentation

## 2024-08-18 DIAGNOSIS — R059 Cough, unspecified: Secondary | ICD-10-CM | POA: Diagnosis present

## 2024-08-18 DIAGNOSIS — J069 Acute upper respiratory infection, unspecified: Secondary | ICD-10-CM | POA: Diagnosis not present

## 2024-08-18 DIAGNOSIS — Z7982 Long term (current) use of aspirin: Secondary | ICD-10-CM | POA: Insufficient documentation

## 2024-08-18 LAB — TROPONIN I (HIGH SENSITIVITY)
Troponin I (High Sensitivity): 76 ng/L — ABNORMAL HIGH (ref <18)
Troponin I (High Sensitivity): 82 ng/L — ABNORMAL HIGH (ref <18)

## 2024-08-18 LAB — CBC
HCT: 44.5 % (ref 39.0–52.0)
Hemoglobin: 15 g/dL (ref 13.0–17.0)
MCH: 33 pg (ref 26.0–34.0)
MCHC: 33.7 g/dL (ref 30.0–36.0)
MCV: 98 fL (ref 80.0–100.0)
Platelets: 297 K/uL (ref 150–400)
RBC: 4.54 MIL/uL (ref 4.22–5.81)
RDW: 11.2 % — ABNORMAL LOW (ref 11.5–15.5)
WBC: 7.2 K/uL (ref 4.0–10.5)
nRBC: 0 % (ref 0.0–0.2)

## 2024-08-18 LAB — COMPREHENSIVE METABOLIC PANEL WITH GFR
ALT: 18 U/L (ref 0–44)
AST: 22 U/L (ref 15–41)
Albumin: 2.9 g/dL — ABNORMAL LOW (ref 3.5–5.0)
Alkaline Phosphatase: 54 U/L (ref 38–126)
Anion gap: 11 (ref 5–15)
BUN: 15 mg/dL (ref 8–23)
CO2: 23 mmol/L (ref 22–32)
Calcium: 8.3 mg/dL — ABNORMAL LOW (ref 8.9–10.3)
Chloride: 109 mmol/L (ref 98–111)
Creatinine, Ser: 0.93 mg/dL (ref 0.61–1.24)
GFR, Estimated: >60 mL/min (ref >60)
Glucose, Bld: 138 mg/dL — ABNORMAL HIGH (ref 70–99)
Potassium: 4 mmol/L (ref 3.5–5.1)
Sodium: 143 mmol/L (ref 135–145)
Total Bilirubin: 0.9 mg/dL (ref 0.0–1.2)
Total Protein: 6.5 g/dL (ref 6.5–8.1)

## 2024-08-18 LAB — URINALYSIS, ROUTINE W REFLEX MICROSCOPIC
Bilirubin Urine: NEGATIVE
Glucose, UA: NEGATIVE mg/dL
Hgb urine dipstick: NEGATIVE
Ketones, ur: 5 mg/dL — AB
Leukocytes,Ua: NEGATIVE
Nitrite: NEGATIVE
Protein, ur: 100 mg/dL — AB
Specific Gravity, Urine: 1.028 (ref 1.005–1.030)
pH: 5 (ref 5.0–8.0)

## 2024-08-18 LAB — RESP PANEL BY RT-PCR (RSV, FLU A&B, COVID)  RVPGX2
Influenza A by PCR: NEGATIVE
Influenza B by PCR: NEGATIVE
Resp Syncytial Virus by PCR: NEGATIVE
SARS Coronavirus 2 by RT PCR: NEGATIVE

## 2024-08-18 MED ORDER — ONDANSETRON 8 MG PO TBDP: 8.0000 mg | ORAL_TABLET | Freq: Three times a day (TID) | ORAL | 0 refills | Status: AC | PRN

## 2024-08-18 MED ORDER — ASPIRIN 81 MG PO CHEW
324.0000 mg | CHEWABLE_TABLET | Freq: Once | ORAL | Status: AC
  Administered 2024-08-18: 324 mg via ORAL
  Filled 2024-08-18: qty 4

## 2024-08-18 MED ORDER — DOXYCYCLINE HYCLATE 100 MG PO CAPS
100.0000 mg | ORAL_CAPSULE | Freq: Two times a day (BID) | ORAL | 0 refills | Status: AC
Start: 2024-08-18 — End: ?

## 2024-08-18 NOTE — ED Triage Notes (Signed)
 Pt BIB [facility] from home for generalized weakness. Pt reports flu-like sxs x2 weeks, including weakness, malaise, and productive cough. Covid home test neg. VSS per EMS. Pt has hx stroke with baseline RUE paralysis, RLE weakness, and speech issues.

## 2024-08-18 NOTE — Discharge Instructions (Signed)
 Take the medications to help with your cough and respiratory symptoms.  Follow-up with your doctor next week to be rechecked to make sure you are improving.  Return to the ED for chest pain fevers or other concerning symptoms

## 2024-08-18 NOTE — ED Notes (Signed)
 Pt took off all monitoring, reports he is going home and does not need this anymore. Pt awaiting PTAR.

## 2024-08-18 NOTE — ED Notes (Signed)
 Pt unable to give urine sample, was given water and reports he will try to give sample.

## 2024-08-18 NOTE — ED Provider Notes (Signed)
 [place] EMERGENCY DEPARTMENT AT [facility] Provider Note   CSN: [id number] Arrival date & time: 08/18/24  1501     Patient presents with: Weakness   Sean Wilkerson is a 78 y.o. male.    Weakness  Pt has history of prior stroke.  He is able to walk with a walker but has some residual right side weakness and speech difficulty.  Pt states he has felt exhausted for 3 weeks.  He has not had much of an appetite.  No vomiting or diarrhea.  No fever.  No abdominal pain or chest pain.   Pt has been coughing a lot.  He has not seen anyone for this since it started.  Felt worse today, called 011  Prior to Admission medications   Medication Sig Start Date End Date Taking? Authorizing Provider  doxycycline (VIBRAMYCIN) 100 MG capsule Take 1 capsule (100 mg total) by mouth 2 (two) times daily. 08/18/24  Yes Randol Simmonds, MD  ondansetron (ZOFRAN-ODT) 8 MG disintegrating tablet Take 1 tablet (8 mg total) by mouth every 8 (eight) hours as needed for nausea or vomiting. 08/18/24  Yes Randol Simmonds, MD  amLODipine  (NORVASC ) 10 MG tablet Take 1 tablet (10 mg total) by mouth daily. 10/30/15   Angiulli, Toribio PARAS, PA-C  aspirin  81 MG tablet Take 81 mg by mouth daily.    [provider]  atorvastatin  (LIPITOR) 40 MG tablet atorvastatin  40 mg tablet    [provider]  carvedilol  (COREG ) 25 MG tablet Take 1 tablet (25 mg total) by mouth 2 (two) times daily with a meal. 10/30/15   Angiulli, Toribio PARAS, PA-C  Melatonin 10 MG TABS Take by mouth.    [provider]  methocarbamol  (ROBAXIN ) 500 MG tablet Take 1 tablet (500 mg total) by mouth every 6 (six) hours as needed for muscle spasms. 10/30/15   Angiulli, Daniel J, PA-C  Multiple Vitamin (MULTIVITAMIN WITH MINERALS) TABS tablet Take 1 tablet by mouth daily.    [provider]  rosuvastatin  (CRESTOR ) 5 MG tablet rosuvastatin  5 mg tablet  TK 1 T PO QD    [provider]  rosuvastatin  (CRESTOR ) 5 MG tablet  rosuvastatin  5 mg tablet  TAKE 1 TABLET BY MOUTH EVERY DAY    [provider]  Vitamins/Minerals TABS Take by mouth.    [provider]    Allergies: No known allergies    Review of Systems  Neurological:  Positive for weakness.    Updated Vital Signs BP 132/89   Pulse 76   Temp 97.6 F (36.4 C) (Oral)   Resp 20   Ht 1.753 m (5' 9)   SpO2 98%   BMI 35.88 kg/m   Physical Exam Vitals and nursing note reviewed.  Constitutional:      General: He is not in acute distress.    Appearance: He is well-developed.  HENT:     Head: Normocephalic and atraumatic.     Right Ear: External ear normal.     Left Ear: External ear normal.  Eyes:     General: No scleral icterus.       Right eye: No discharge.        Left eye: No discharge.     Conjunctiva/sclera: Conjunctivae normal.  Neck:     Trachea: No tracheal deviation.  Cardiovascular:     Rate and Rhythm: Normal rate and regular rhythm.  Pulmonary:     Effort: Pulmonary effort is normal. No respiratory distress.  Breath sounds: Normal breath sounds. No stridor. No wheezing or rales.  Abdominal:     General: Bowel sounds are normal. There is no distension.     Palpations: Abdomen is soft.     Tenderness: There is no abdominal tenderness. There is no guarding or rebound.  Musculoskeletal:        General: No tenderness or deformity.     Cervical back: Neck supple.  Skin:    General: Skin is warm and dry.     Findings: No rash.  Neurological:     General: No focal deficit present.     Mental Status: He is alert.     Cranial Nerves: Dysarthria and facial asymmetry present.     Sensory: No sensory deficit.     Motor: Weakness present. No abnormal muscle tone or seizure activity.     Coordination: Coordination normal.     Comments: Weakness right side compared to left, baseline per patient;    Psychiatric:        Mood and Affect: Mood normal.     (all labs ordered are listed, but only abnormal  results are displayed) Labs Reviewed  CBC - Abnormal; Notable for the following components:      Result Value   RDW 11.2 (*)    All other components within normal limits  COMPREHENSIVE METABOLIC PANEL WITH GFR - Abnormal; Notable for the following components:   Glucose, Bld 138 (*)    Calcium  8.3 (*)    Albumin 2.9 (*)    All other components within normal limits  URINALYSIS, ROUTINE W REFLEX MICROSCOPIC - Abnormal; Notable for the following components:   Color, Urine AMBER (*)    APPearance CLOUDY (*)    Ketones, ur 5 (*)    Protein, ur 100 (*)    Bacteria, UA FEW (*)    All other components within normal limits  TROPONIN I (HIGH SENSITIVITY) - Abnormal; Notable for the following components:   Troponin I (High Sensitivity) 76 (*)    All other components within normal limits  TROPONIN I (HIGH SENSITIVITY) - Abnormal; Notable for the following components:   Troponin I (High Sensitivity) 82 (*)    All other components within normal limits  RESP PANEL BY RT-PCR (RSV, FLU A&B, COVID)  [id number]    EKG: EKG Interpretation Date/Time:  Saturday August 18 2024 15:48:58 EDT Ventricular Rate:  74 PR Interval:  147 QRS Duration:  93 QT Interval:  397 QTC Calculation: 441 R Axis:   -50  Text Interpretation: Sinus rhythm Left axis deviation Borderline T wave abnormalities No significant change since last tracing Confirmed by Randol Simmonds [phone]) on 08/18/2024 3:53:28 PM  Radiology: [facility] Chest 2 View Result Date: 08/18/2024 CLINICAL DATA:  Weakness EXAM: CHEST - 2 VIEW COMPARISON:  None Available. FINDINGS: Frontal and lateral views of the chest demonstrate an unremarkable cardiac silhouette. No acute airspace disease, effusion, or pneumothorax. No acute bony abnormalities. IMPRESSION: 1. No acute intrathoracic process. Electronically Signed   By: Ozell Daring M.D.   On: 08/18/2024 17:24     Procedures   Medications Ordered in the ED  aspirin  chewable tablet 324 mg (324 mg Oral Given  08/18/24 1555)    Clinical Course as of 08/18/24 2107  Sat Aug 18, 2024  1711 CBC(!) CBC normal.  Metabolic panel normal.  Troponin slightly elevated at 76.  COVID flu RSV negative [JK]  1821 CBC(!) [JK]  1821 Chest x-ray without signs of pneumonia [JK]  2004 Troponin I (  High Sensitivity)(!) Second troponin similar to previous [JK]  2045 Urinalysis, Routine w reflex microscopic -Urine, Clean Catch(!) Urinalysis without signs of infection [JK]    Clinical Course User Index [JK] Randol Simmonds, MD                                 Medical Decision Making Problems Addressed: Elevated troponin: acute illness or injury that poses a threat to life or bodily functions Viral upper respiratory tract infection: acute illness or injury that poses a threat to life or bodily functions  Amount and/or Complexity of Data Reviewed Labs: ordered. Decision-making details documented in ED Course. Radiology: ordered and independent interpretation performed.  Risk OTC drugs. Prescription drug management.   Patient presented to the ED with complaints of URI type symptoms generalized weakness.  Patient at baseline uses a wheelchair and a walker.  He has been having to use his wheelchair more.  His ED workup does not show any signs of anemia.  No signs of severe dehydration.  COVID flu RSV was negative.  Chest x-ray does not show pneumonia.  Urinalysis does not show UTI.  Patient did have an elevated troponin.  Repeat however is not significantly changed.  I doubt acute cardiac etiology.  He is not having any chest pain his EKG is unchanged.  I do think he could benefit from outpatient cardiology follow-up.  Suspect bronchitis or possible viral illness contributing to his symptoms will start him on a course of doxycycline considering the duration of his symptoms.  Recommend outpatient follow-up with PCP.  Discussed making sure patient was strong enough to go home and possibly try to ambulate with it while he  is here.  Patient declines that.  He states he has his wheelchair at home.     Final diagnoses:  Viral upper respiratory tract infection  Elevated troponin    ED Discharge Orders          Ordered    doxycycline (VIBRAMYCIN) 100 MG capsule  2 times daily        08/18/24 2103    ondansetron (ZOFRAN-ODT) 8 MG disintegrating tablet  Every 8 hours PRN        08/18/24 2103               Randol Simmonds, MD 08/18/24 2107

## 2024-09-17 DEATH — deceased
